# Patient Record
Sex: Female | Born: 1955
Health system: Southern US, Community
[De-identification: ages and names within clinical notes are randomized; demographics above are authoritative.]

## PROBLEM LIST (undated history)

## (undated) DIAGNOSIS — G4730 Sleep apnea, unspecified: Secondary | ICD-10-CM

## (undated) DIAGNOSIS — M1710 Unilateral primary osteoarthritis, unspecified knee: Secondary | ICD-10-CM

## (undated) DIAGNOSIS — E785 Hyperlipidemia, unspecified: Secondary | ICD-10-CM

## (undated) DIAGNOSIS — T7840XA Allergy, unspecified, initial encounter: Secondary | ICD-10-CM

## (undated) DIAGNOSIS — M179 Osteoarthritis of knee, unspecified: Secondary | ICD-10-CM

## (undated) DIAGNOSIS — K589 Irritable bowel syndrome without diarrhea: Secondary | ICD-10-CM

## (undated) DIAGNOSIS — I2699 Other pulmonary embolism without acute cor pulmonale: Secondary | ICD-10-CM

## (undated) DIAGNOSIS — K219 Gastro-esophageal reflux disease without esophagitis: Secondary | ICD-10-CM

## (undated) HISTORY — DX: Sleep apnea, unspecified: G47.30

## (undated) HISTORY — DX: Allergy, unspecified, initial encounter: T78.40XA

## (undated) HISTORY — PX: CHOLECYSTECTOMY: SHX55

## (undated) HISTORY — DX: Hyperlipidemia, unspecified: E78.5

## (undated) HISTORY — PX: JOINT REPLACEMENT: SHX530

## (undated) HISTORY — PX: TUBAL LIGATION: SHX77

---

## 2001-02-06 ENCOUNTER — Encounter: Admission: RE | Admit: 2001-02-06 | Discharge: 2001-05-07 | Payer: Self-pay | Admitting: Podiatry

## 2001-12-31 ENCOUNTER — Other Ambulatory Visit: Admission: RE | Admit: 2001-12-31 | Discharge: 2001-12-31 | Payer: Self-pay | Admitting: Obstetrics and Gynecology

## 2002-02-27 ENCOUNTER — Emergency Department (HOSPITAL_COMMUNITY): Admission: EM | Admit: 2002-02-27 | Discharge: 2002-02-27 | Payer: Self-pay | Admitting: Emergency Medicine

## 2002-07-14 ENCOUNTER — Emergency Department (HOSPITAL_COMMUNITY): Admission: EM | Admit: 2002-07-14 | Discharge: 2002-07-14 | Payer: Self-pay | Admitting: Emergency Medicine

## 2002-07-15 ENCOUNTER — Ambulatory Visit (HOSPITAL_COMMUNITY): Admission: RE | Admit: 2002-07-15 | Discharge: 2002-07-15 | Payer: Self-pay | Admitting: Emergency Medicine

## 2002-07-15 ENCOUNTER — Emergency Department (HOSPITAL_COMMUNITY): Admission: EM | Admit: 2002-07-15 | Discharge: 2002-07-15 | Payer: Self-pay | Admitting: Emergency Medicine

## 2002-07-15 ENCOUNTER — Encounter: Payer: Self-pay | Admitting: Emergency Medicine

## 2002-07-16 ENCOUNTER — Emergency Department (HOSPITAL_COMMUNITY): Admission: EM | Admit: 2002-07-16 | Discharge: 2002-07-16 | Payer: Self-pay | Admitting: Emergency Medicine

## 2003-01-08 ENCOUNTER — Encounter: Payer: Self-pay | Admitting: Emergency Medicine

## 2003-01-08 ENCOUNTER — Emergency Department (HOSPITAL_COMMUNITY): Admission: EM | Admit: 2003-01-08 | Discharge: 2003-01-08 | Payer: Self-pay | Admitting: Emergency Medicine

## 2003-01-10 ENCOUNTER — Encounter: Payer: Self-pay | Admitting: Orthopaedic Surgery

## 2003-01-10 ENCOUNTER — Ambulatory Visit (HOSPITAL_COMMUNITY): Admission: RE | Admit: 2003-01-10 | Discharge: 2003-01-10 | Payer: Self-pay | Admitting: Orthopaedic Surgery

## 2003-05-19 ENCOUNTER — Emergency Department (HOSPITAL_COMMUNITY): Admission: EM | Admit: 2003-05-19 | Discharge: 2003-05-19 | Payer: Self-pay | Admitting: Emergency Medicine

## 2003-05-20 ENCOUNTER — Encounter: Payer: Self-pay | Admitting: Emergency Medicine

## 2003-05-20 ENCOUNTER — Emergency Department (HOSPITAL_COMMUNITY): Admission: EM | Admit: 2003-05-20 | Discharge: 2003-05-20 | Payer: Self-pay | Admitting: Emergency Medicine

## 2003-12-16 ENCOUNTER — Emergency Department (HOSPITAL_COMMUNITY): Admission: EM | Admit: 2003-12-16 | Discharge: 2003-12-16 | Payer: Self-pay | Admitting: Emergency Medicine

## 2003-12-17 ENCOUNTER — Emergency Department (HOSPITAL_COMMUNITY): Admission: EM | Admit: 2003-12-17 | Discharge: 2003-12-17 | Payer: Self-pay | Admitting: Emergency Medicine

## 2004-01-23 ENCOUNTER — Emergency Department (HOSPITAL_COMMUNITY): Admission: EM | Admit: 2004-01-23 | Discharge: 2004-01-24 | Payer: Self-pay

## 2005-04-11 ENCOUNTER — Emergency Department (HOSPITAL_COMMUNITY): Admission: EM | Admit: 2005-04-11 | Discharge: 2005-04-11 | Payer: Self-pay | Admitting: Emergency Medicine

## 2005-05-26 ENCOUNTER — Other Ambulatory Visit: Admission: RE | Admit: 2005-05-26 | Discharge: 2005-05-26 | Payer: Self-pay | Admitting: Orthopedic Surgery

## 2005-05-29 ENCOUNTER — Emergency Department (HOSPITAL_COMMUNITY): Admission: EM | Admit: 2005-05-29 | Discharge: 2005-05-29 | Payer: Self-pay | Admitting: Emergency Medicine

## 2006-03-17 ENCOUNTER — Observation Stay (HOSPITAL_COMMUNITY): Admission: EM | Admit: 2006-03-17 | Discharge: 2006-03-18 | Payer: Self-pay | Admitting: *Deleted

## 2006-03-20 ENCOUNTER — Inpatient Hospital Stay (HOSPITAL_COMMUNITY): Admission: AD | Admit: 2006-03-20 | Discharge: 2006-03-24 | Payer: Self-pay | Admitting: Internal Medicine

## 2006-05-09 ENCOUNTER — Other Ambulatory Visit: Admission: RE | Admit: 2006-05-09 | Discharge: 2006-05-09 | Payer: Self-pay | Admitting: Obstetrics and Gynecology

## 2006-07-12 ENCOUNTER — Emergency Department (HOSPITAL_COMMUNITY): Admission: EM | Admit: 2006-07-12 | Discharge: 2006-07-12 | Payer: Self-pay | Admitting: Emergency Medicine

## 2007-04-07 ENCOUNTER — Emergency Department (HOSPITAL_COMMUNITY): Admission: EM | Admit: 2007-04-07 | Discharge: 2007-04-07 | Payer: Self-pay | Admitting: Emergency Medicine

## 2007-06-16 ENCOUNTER — Ambulatory Visit (HOSPITAL_COMMUNITY): Admission: RE | Admit: 2007-06-16 | Discharge: 2007-06-16 | Payer: Self-pay | Admitting: Family Medicine

## 2008-01-01 ENCOUNTER — Ambulatory Visit (HOSPITAL_COMMUNITY): Admission: RE | Admit: 2008-01-01 | Discharge: 2008-01-01 | Payer: Self-pay | Admitting: Family Medicine

## 2008-06-04 ENCOUNTER — Ambulatory Visit (HOSPITAL_COMMUNITY): Admission: RE | Admit: 2008-06-04 | Discharge: 2008-06-04 | Payer: Self-pay | Admitting: Family Medicine

## 2011-02-25 NOTE — Discharge Summary (Signed)
Mallory [name], Mallory [name] NO.:  [id number]   MEDICAL RECORD NO.:  [id number]          PATIENT TYPE:  OBV   LOCATION:  4728                         FACILITY:  [facility]   PHYSICIAN:  Elliot Cousin, M.D.    DATE OF BIRTH:  1956/09/29   DATE OF ADMISSION:  03/16/2006  DATE OF DISCHARGE:  03/18/2006                                 DISCHARGE SUMMARY   DISCHARGE DIAGNOSES:  1.  Chest pain, myocardial infarction ruled out.  2.  Gastroesophageal reflux disease.  3.  Hyperlipidemia.  4.  Asymptomatic bradycardia.  5.  History of Crohn's disease.   DISCHARGE MEDICATIONS:  1.  Nexium 40 mg daily.  2.  Lipitor 10 mg nightly.   DISCHARGE DISPOSITION:  The patient was discharged to home in improved and  stable condition on March 18, 2006.  She was advised to follow up with the  nurse family practitioner and/or Dr. Hal Hope at [facility]  or the [facility] in [place].   PROCEDURE PERFORMED:  Bilateral lower extremity venous Doppler study.  The  results were negative for DVT.   HISTORY OF PRESENT ILLNESS:  The patient is a 55 year old lady with no  significant past medical history with the exception of a remote history of  Crohn's disease and gastroesophageal reflux disease, who presented to the  emergency department on March 16, 2006, with a chief complaint of intermittent  chest pain.  The patient's family history is positive for coronary artery  disease.  She was therefore admitted for further evaluation and management.   HOSPITAL COURSE:  CHEST PAIN/GASTROESOPHAGEAL REFLUX  DISEASE/HYPERLIPIDEMIA/ASYMPTOMATIC BRADYCARDIA.  On admission, the patient  was hemodynamically stable.  Her EKG revealed a normal sinus rhythm with a  heart rate of 73 beats per minute with no acute abnormalities.  Cardiac  markers in the emergency department were negative.  Symptomatic treatment  was started with as needed sublingual nitroglycerin, as needed Tylenol,  and  as needed Dilaudid.  The patient was started on prophylactic Lovenox 40 mg  subcut. q.24 h.  Protonix at 40 mg b.i.d. was prescribed as well.  Cardiac  enzymes were ordered as well as a BNP, D-dimer, TSH, and lipid profile.  The  cardiac enzymes x3 were completely negative; therefore, the patient ruled  out for a myocardial infarction.  The D-dimer was elevated at 0.37.  The BNP  was less than 30.  The TSH was within normal limits at 2.395.  The fasting  lipid profile revealed a total cholesterol of 204, triglycerides of 91, HDL  cholesterol of 37, and LDL of 149.  The patient was started on Lipitor 10 mg  nightly.  Because of the elevated D-dimer, bilateral lower extremity venous  Dopplers were ordered to rule out DVT.  The results were negative.   The following morning, a followup EKG revealed sinus bradycardia with a  heart rate of 54 beats per minute.  The patient was completely asymptomatic.   She gives a positive history for heartburn particularly after eating.  She  has been self medicating with over-the-counter Prilosec, but  apparently it  has not been helping.  The most likely etiology of the patient's chest pain  is gastroesophageal reflux disease.  She was prescribed and sent home on  Nexium 40 mg daily.  Further followup and evaluation will be deferred to her  primary care physician.  The patient was also advised on lifestyle changes  including weight loss, avoiding greasy and fried foods, and sitting up for  at least 2 hours after eating.      Elliot Cousin, M.D.  Electronically Signed     DF/MEDQ  D:  03/18/2006  T:  03/20/2006  Job:  [id number]   cc:   Clydie Braun L. Hal Hope, M.D.  Fax: [phone]

## 2011-02-25 NOTE — H&P (Signed)
Mallory [name], Mallory [name]                ACCOUNT NO.:  [id number]   MEDICAL RECORD NO.:  [id number]          PATIENT TYPE:  INP   LOCATION:  A209                          FACILITY:  [facility]   PHYSICIAN:  [name]. [name], MD  DATE OF BIRTH:  06-Mar-1956   DATE OF ADMISSION:  03/20/2006  DATE OF DISCHARGE:  LH                                HISTORY & PHYSICAL   CHIEF COMPLAINT:  Chest pain.   HISTORY OF PRESENT ILLNESS:  Patient presented after being discharged from  [facility] for chest pain and shortness of breath.  Her symptoms  persisted and got worse.  She had notable dyspnea on exertion which had been  severe.  Her chest pain is described as substernal and radiating to her  right arm.  This has been going on for multiple days and getting worse.  She  told me that her work-up was negative in-house and in fact, on March 16, 2006  history and physical she was noted to have previous reflux which is true.  She had initial diagnosis of Crohn's disease earlier age.  There reportedly  her enzymes were negative in-house and a discharge summary dictated by Dr.  Elliot Cousin on March 18, 2006 stated that she had asymptomatic bradycardia,  hyperlipidemia, and GERD with chest pain not further worked up.  On  presentation to the office she was concerned over ongoing chest pain, no  relief with antacids.  She had no hemoptysis, fever, cough, or chills.  No  hematemesis, hematochezia, or melena.   PAST MEDICAL HISTORY:  As outlined in her HPI.   SOCIAL HISTORY:  Nonsmoker.  No alcohol or other drug use.   FAMILY HISTORY:  Positive for young coronary deaths in female members,  otherwise noncontributory.   REVIEW OF SYSTEMS:  As under HPI, also negative.   PHYSICAL EXAMINATION:  SKIN:  Unremarkable.  HEENT:  No JVD or adenopathy.  NECK:  Supple.  CHEST:  Clear and nontender.  CARDIAC:  Regular rate and rhythm without murmurs, rubs, or gallops.  ABDOMEN:  Soft.  No organomegaly or masses.  EXTREMITIES:  Without clubbing, cyanosis, edema.  NEUROLOGIC:  Nonfocal.   LABORATORIES:  On admission revealed a positive elevated D-dimer.  This  resulted eventually due to technical difficulties and some delay with a CT  of the chest to rule out a pulmonary embolus which was positive.  Anticoagulation was started.  She will be admitted for continued  anticoagulation oral anticoagulant therapy  introduction.  High-dose antacids and Carafate suspension as well as H.  pylori serology will be obtained.  Eventually, she is going to need an EGD.  However, we will treat her pulmonary embolus first.  Will also have  cardiology see her for outpatient post PE stabilization evaluation of  coronary status.      [name]. [name], MD  Electronically Signed     LJF/MEDQ  D:  03/21/2006  T:  03/21/2006  Job:  [id number]

## 2011-02-25 NOTE — H&P (Signed)
Mallory [name], Mallory [name]                ACCOUNT NO.:  [id number]   MEDICAL RECORD NO.:  [id number]          PATIENT TYPE:  INP   LOCATION:  1827                         FACILITY:  [facility]   PHYSICIAN:  Lonia Blood, M.D.      DATE OF BIRTH:  Sep 07, 1956   DATE OF ADMISSION:  03/16/2006  DATE OF DISCHARGE:                                HISTORY & PHYSICAL   PRIMARY CARE PHYSICIAN:  [facility].   PRESENTING COMPLAINT:  Retrosternal  chest pain.   HISTORY OF THE PRESENT ILLNESS:  The patient is a 55 year old female with no  significant past medical history, except for a remote history of Crohn's  disease  as well as GERD.  The patient is not taking anything for her reflux  disease.  For the past three days she has had chest pain on and off mainly  in the retrosternal region. She initially thought it was her acid reflux;  however, this was severe and worse than what she normally experiences with  her acid reflux.  She went to work, but on her way there she could not  tolerate the pain.  She subsequently was brought to the emergency room.  Currently she is feeling better after a combination of treatment with some  sublingual nitro and morphine.   CARDIAC RISK FACTORS:  The patient's risks factors for cardia disease are  low, except for her father having cardiac disease in his 64s and died from  heart disease.   PAST MEDICAL HISTORY:  The past medical history is significant for mainly  GERD and Crohn's disease when she was 18.  Currently not on any medications.   ALLERGIES:  The patient has no known drug allergies.   MEDICATIONS:  The medications are none.   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  She denies any tobacco or alcohol intake.  She has two  children.  She is divorced currently.  She lives in [place].   FAMILY HISTORY:  Father had coronary artery disease an died from heart  disease.  Mother is relatively healthy.   REVIEW OF SYSTEMS:  A 12-point review of systems performed  is mainly per  the HPI.   PHYSICAL EXAMINATION:  VITAL SIGNS:  On exam temperature is 97.7, blood  pressure 134/88,  pulse 76, respiratory rate 16.  GENERAL APPEARANCE:  The patient is alert, awake and oriented, and in no  acute distress.  HEENT:  PERRL.  EOMI.  NECK:  The neck is supple.  No JVD.  No lymphadenopathy.  CHEST AND LUNGS:  Respiratory shows good air entry  bilaterally.  No wheezes  or rales.  HEART:  Cardiovascular system shows she has a regular rate and rhythm.  ABDOMEN:  The abdomen is soft and nontender with positive bowel sounds.  EXTREMITIES:  The extremities show no edema, cyanosis or clubbing.   LABORATORY DATA:  The patient's labs show sodium 138, potassium 3.8,  chloride 104, BUN 9, and glucose 98.  Creatinine is also normal.  Initial  cardiac enzymes are negative.  EKG  shows normal sinus rhythm and no ST-T  wave changes.  Chest x-ray is also negative   ASSESSMENT:  This a 55 year old female who has moderate risk actors for  cardiac disease who presents with retrosternal chest pain.  Her pain is  slightly reproducible with pressure.  I suspect this is more likely due to  be gastrointestinal than cardiac.  She has, however, described these  symptoms as unusual and out of the ordinary from what she is used.   For the above reasons I will proceed as follows:  1.  Chest pain.  I will admit the patient for 23 hours observation.  I will      check serial cardiac enzymes fasting lipid panel  TSH and BNP.  I will      also review her full electrolyte panel and CBC.   1.  I will treat her for reflux with proton pump inhibitor q.12 hours at      this point.   1.  If her enzymes are negative within the next 24 hours and her lipid panel      is okay I suggest that the patient will not require any stress testing      or invasive procedures.  If, however, any of these are quite abnormal      then we may have  to  arrange for her to have risk stratification,      probably a stress test either as an inpatient or as a outpatient.      Lonia Blood, M.D.  Electronically Signed     LG/MEDQ  D:  03/17/2006  T:  03/17/2006  Job:  [id number]

## 2011-02-25 NOTE — Discharge Summary (Signed)
Mallory [name], Mallory [name]                ACCOUNT NO.:  [id number]   MEDICAL RECORD NO.:  [id number]          PATIENT TYPE:  INP   LOCATION:  A209                          FACILITY:  [facility]   PHYSICIAN:  [name]. [name], MD  DATE OF BIRTH:  [birth year]   DATE OF ADMISSION:  03/20/2006  DATE OF DISCHARGE:  06/15/2007LH                                 DISCHARGE SUMMARY   DISCHARGE DIAGNOSES:  1.  Pulmonary embolus, acute.  2.  Hyperlipidemia.  3.  Gastroesophageal reflux disease.  4.  Regional enteritis.   DISCHARGE MEDICATIONS:  1.  Coumadin 10 mg p.o. daily.  2.  Zocor 40 mg p.o. daily.   SUMMARY:  The patient was admitted with chest pain after being discharged  from [facility] for an identical evaluation.  She, in fact, was  ruled out for a myocardial infarction down there.  However, pulmonary  embolus was not entertained in the diagnostic differential diagnosis.  Upon  seeing her in the office, I was concerned over shortness of breath and  pleuritic chest pain.  We ended up getting a CT scan which confirmed the  clinical impression of pulmonary embolus.  She was admitted for institution  of anticoagulation.  She did well without complication and was discharged  for therapeutic indices to be followed as an outpatient office visit.      [name]. [name], MD  Electronically Signed     LJF/MEDQ  D:  04/13/2006  T:  04/13/2006  Job:  [id number]

## 2011-04-28 ENCOUNTER — Emergency Department (HOSPITAL_COMMUNITY): Payer: Self-pay

## 2011-04-28 ENCOUNTER — Other Ambulatory Visit: Payer: Self-pay

## 2011-04-28 ENCOUNTER — Encounter: Payer: Self-pay | Admitting: Emergency Medicine

## 2011-04-28 ENCOUNTER — Emergency Department (HOSPITAL_COMMUNITY)
Admission: EM | Admit: 2011-04-28 | Discharge: 2011-04-28 | Disposition: A | Discharge status: Home / Self Care | Payer: Self-pay | Attending: Emergency Medicine | Admitting: Emergency Medicine

## 2011-04-28 DIAGNOSIS — R071 Chest pain on breathing: Secondary | ICD-10-CM | POA: Insufficient documentation

## 2011-04-28 DIAGNOSIS — Z86718 Personal history of other venous thrombosis and embolism: Secondary | ICD-10-CM | POA: Insufficient documentation

## 2011-04-28 DIAGNOSIS — R0789 Other chest pain: Secondary | ICD-10-CM

## 2011-04-28 HISTORY — DX: Other pulmonary embolism without acute cor pulmonale: I26.99

## 2011-04-28 LAB — HEPATIC FUNCTION PANEL
ALT: 27 U/L (ref 0–35)
AST: 23 U/L (ref 0–37)
Bilirubin, Direct: 0.1 mg/dL (ref 0.0–0.3)
Total Bilirubin: 0.4 mg/dL (ref 0.3–1.2)
Total Protein: 7.4 g/dL (ref 6.0–8.3)

## 2011-04-28 LAB — BASIC METABOLIC PANEL
CO2: 29 mEq/L (ref 19–32)
Calcium: 9.6 mg/dL (ref 8.4–10.5)
GFR calc Af Amer: >60 mL/min (ref >60)
GFR calc non Af Amer: >60 mL/min (ref >60)
Sodium: 139 mEq/L (ref 135–145)

## 2011-04-28 LAB — CBC
Hemoglobin: 16 g/dL — ABNORMAL HIGH (ref 12.0–15.0)
MCHC: 33.3 g/dL (ref 30.0–36.0)
RDW: 13.6 % (ref 11.5–15.5)
WBC: 7 10*3/uL (ref 4.0–10.5)

## 2011-04-28 LAB — CARDIAC PANEL(CRET KIN+CKTOT+MB+TROPI): Total CK: 67 U/L (ref 7–177)

## 2011-04-28 MED ORDER — OXYCODONE-ACETAMINOPHEN 5-325 MG PO TABS
1.0000 | ORAL_TABLET | Freq: Once | ORAL | Status: AC
  Administered 2011-04-28: 1 via ORAL
  Filled 2011-04-28: qty 1

## 2011-04-28 MED ORDER — IOHEXOL 350 MG/ML SOLN
100.0000 mL | Freq: Once | INTRAVENOUS | Status: AC | PRN
  Administered 2011-04-28: 100 mL via INTRAVENOUS

## 2011-04-28 MED ORDER — HYDROCODONE-ACETAMINOPHEN 5-325 MG PO TABS: 20.0000 | ORAL_TABLET | Freq: Four times a day (QID) | ORAL | Status: AC | PRN

## 2011-04-28 NOTE — ED Notes (Signed)
Pt to CT

## 2011-04-28 NOTE — ED Notes (Signed)
Pt reports mid-sternal cp that began this am around 2:00.  Pt denies any sob, diaphoresis, n/v.  Pt has a hx of pulmonary embolism.

## 2011-04-28 NOTE — ED Provider Notes (Addendum)
History     Chief Complaint  Patient presents with  . Chest Pain   HPI Comments: Sob and diaphoresis mild and intermittent. Report h/o PE with similar pain. Tried antacids this AM with no relief.  Patient is a 55 y.o. female presenting with chest pain. The history is provided by the patient.  Chest Pain The chest pain began yesterday. Chest pain occurs constantly. The chest pain is unchanged. Quality: on right side last night but on left side this AM; reports similar pain in the past dx with PE. The pain does not radiate. Primary symptoms include shortness of breath. Pertinent negatives for primary symptoms include no fever, no fatigue, no syncope, no cough, no abdominal pain, no nausea and no vomiting.  Associated symptoms include diaphoresis.  Pertinent negatives for associated symptoms include no lower extremity edema.  Pertinent negatives for past medical history include no seizures.     Past Medical History  Diagnosis Date  . Pulmonary embolism     Past Surgical History  Procedure Date  . Cholecystectomy   . Cesarean section     Family History  Problem Relation Age of Onset  . Diabetes Mother   . Heart failure Father   . Diabetes Brother     History  Substance Use Topics  . Smoking status: Never Smoker   . Smokeless tobacco: Not on file  . Alcohol Use: No    OB History    Grav Para Term Preterm Abortions TAB SAB Ect Mult Living                  Review of Systems  Constitutional: Positive for diaphoresis. Negative for fever and fatigue.  HENT: Negative for congestion, sinus pressure and ear discharge.   Eyes: Negative for discharge.  Respiratory: Positive for shortness of breath. Negative for cough.   Cardiovascular: Positive for chest pain. Negative for syncope.  Gastrointestinal: Negative for nausea, vomiting, abdominal pain and diarrhea.  Genitourinary: Negative for frequency and hematuria.  Musculoskeletal: Negative for back pain.  Skin: Negative for  rash.  Neurological: Negative for seizures and headaches.  Hematological: Negative.   Psychiatric/Behavioral: Negative for hallucinations.    Physical Exam  LMP 04/23/2011  Physical Exam  Constitutional: She is oriented to person, place, and time. She appears well-developed.  HENT:  Head: Normocephalic and atraumatic.  Eyes: Conjunctivae and EOM are normal. No scleral icterus.  Neck: Neck supple. No thyromegaly present.  Cardiovascular: Normal rate and regular rhythm.  Exam reveals no gallop and no friction rub.   No murmur heard. Pulmonary/Chest: No stridor. She has no wheezes. She has no rales. She exhibits tenderness.  Abdominal: She exhibits no distension. There is no tenderness. There is no rebound.  Musculoskeletal: Normal range of motion. She exhibits no edema.       Mild redness to dorsum right foot with minimal soreness (appears to be from shoe)  Lymphadenopathy:    She has no cervical adenopathy.  Neurological: She is oriented to person, place, and time. Coordination normal.  Skin: No rash noted. No erythema.  Psychiatric: She has a normal mood and affect. Her behavior is normal.    ED Course  Procedures  Results for orders placed during the hospital encounter of 04/28/11  BASIC METABOLIC PANEL      Component Value Range   Sodium 139  135 - 145 (mEq/L)   Potassium 3.8  3.5 - 5.1 (mEq/L)   Chloride 101  96 - 112 (mEq/L)   CO2 29  19 - 32 (mEq/L)   Glucose, Bld 102 (*) 70 - 99 (mg/dL)   BUN 8  6 - 23 (mg/dL)   Creatinine, Ser 7.82  0.50 - 1.10 (mg/dL)   Calcium 9.6  8.4 - 95.6 (mg/dL)   GFR calc non Af Amer >60  >60 (mL/min)   GFR calc Af Amer >60  >60 (mL/min)  CBC      Component Value Range   WBC 7.0  4.0 - 10.5 (K/uL)   RBC 5.75 (*) 3.87 - 5.11 (MIL/uL)   Hemoglobin 16.0 (*) 12.0 - 15.0 (g/dL)   HCT 21.3 (*) 08.6 - 46.0 (%)   MCV 83.5  78.0 - 100.0 (fL)   MCH 27.8  26.0 - 34.0 (pg)   MCHC 33.3  30.0 - 36.0 (g/dL)   RDW 57.8  46.9 - 62.9 (%)   Platelets  269  150 - 400 (K/uL)  HEPATIC FUNCTION PANEL      Component Value Range   Total Protein 7.4  6.0 - 8.3 (g/dL)   Albumin 3.6  3.5 - 5.2 (g/dL)   AST 23  0 - 37 (U/L)   ALT 27  0 - 35 (U/L)   Alkaline Phosphatase 106  39 - 117 (U/L)   Total Bilirubin 0.4  0.3 - 1.2 (mg/dL)   Bilirubin, Direct 0.1  0.0 - 0.3 (mg/dL)   Indirect Bilirubin 0.3  0.3 - 0.9 (mg/dL)  D-DIMER, QUANTITATIVE      Component Value Range   D-Dimer, Quant 0.49 (*) 0.00 - 0.48 (ug/mL-FEU)  CARDIAC PANEL(CRET KIN+CKTOT+MB+TROPI)      Component Value Range   Total CK PENDING  7 - 177 (U/L)   CK, MB 1.9  0.3 - 4.0 (ng/mL)   Troponin I <0.30  <0.30 (ng/mL)   Relative Index PENDING  0.0 - 2.5    Ct Angio Chest W/cm &/or Wo Cm  04/28/2011  *RADIOLOGY REPORT*  Clinical Data:  Chest pain, shortness of breath, history pulmonary embolism  CT ANGIOGRAPHY CHEST WITH CONTRAST  Technique:  Multidetector CT imaging of the chest was performed using the standard protocol during bolus administration of intravenous contrast.  Multiplanar CT image reconstructions including MIPs were obtained to evaluate the vascular anatomy.  Contrast:  100 ml Omnipaque 350 IV.  Comparison:  06/04/2008  Findings: Aorta normal caliber without aneurysm or dissection. Pulmonary arteries patent. No evidence of pulmonary embolism. Scattered normal-sized axillary, mediastinal and hilar nodes. Visualized portion of liver and spleen normal appearance. Bibasilar atelectasis. No pulmonary infiltrate, pleural effusion or pneumothorax. No acute osseous findings.  Review of the MIP images confirms the above findings.  IMPRESSION: Bibasilar atelectasis. No evidence pulmonary embolism or additional acute intrathoracic process.  Original Report Authenticated By: Lollie Marrow, M.D.   Chest Portable 1 View  04/28/2011  *RADIOLOGY REPORT*  Clinical Data:  Chest pain, history pulmonary embolism  PORTABLE CHEST - 1 VIEW  Comparison: Portable exam 0851 hours compared to 01/01/2008   Findings: Borderline enlargement of cardiac silhouette. Mediastinal contours and pulmonary vascularity normal. Bibasilar atelectasis. Lungs otherwise clear. No pneumothorax or pleural effusion. Osseous structures unremarkable.  IMPRESSION: Borderline enlargement of cardiac silhouette. Bibasilar atelectasis.  Original Report Authenticated By: Lollie Marrow, M.D.    Date: 04/28/2011  Rate: 59  Rhythm: sinus bradycardia  QRS Axis: normal  Intervals: normal  ST/T Wave abnormalities: nonspecific ST changes  Conduction Disutrbances:none  Narrative Interpretation:   Old EKG Reviewed: unchanged    11:53 AM Pt recheck: pt still c/o discomfort/soreness  in anterior chest wall, worse with movement, radiates back between shoulders; mild tenderness on palpation; discussed negative CT chest findings and blood work results with pt; recommend close f/u with PCP Dr. [name], pt understands and agrees.  MDM Pt h/o PE with similar chest pain; d-dimer positive (0.49); chest CT negative for PE; cardiac markers normal; mild tenderness to anterior chest wall on reexam; will d/c with pain meds and close f/u with Dr. [name].   Written by Enos Fling acting as scribe for Benny Lennert, MD. I personally performed the services described in this documentation, which was scribed in my presence. The recorded information has been reviewed and considered.   Benny Lennert, MD 04/28/11 1200  Benny Lennert, MD 04/28/11 1201

## 2011-04-28 NOTE — ED Notes (Signed)
Pt c/o left sided chest pain since 2 am. Pt states she has hx of blood clot and this pain feels the same way.

## 2011-06-23 ENCOUNTER — Other Ambulatory Visit (HOSPITAL_COMMUNITY): Payer: Self-pay | Admitting: Family Medicine

## 2011-06-23 DIAGNOSIS — R109 Unspecified abdominal pain: Secondary | ICD-10-CM

## 2011-06-28 ENCOUNTER — Ambulatory Visit (HOSPITAL_COMMUNITY)
Admission: RE | Admit: 2011-06-28 | Discharge: 2011-06-28 | Disposition: A | Discharge status: Home / Self Care | Payer: Self-pay | Source: Ambulatory Visit | Attending: Family Medicine | Admitting: Family Medicine

## 2011-06-28 DIAGNOSIS — R109 Unspecified abdominal pain: Secondary | ICD-10-CM | POA: Insufficient documentation

## 2011-07-08 ENCOUNTER — Encounter (INDEPENDENT_AMBULATORY_CARE_PROVIDER_SITE_OTHER): Payer: Self-pay | Admitting: *Deleted

## 2011-07-21 ENCOUNTER — Encounter (INDEPENDENT_AMBULATORY_CARE_PROVIDER_SITE_OTHER): Payer: Self-pay | Admitting: Internal Medicine

## 2011-07-21 ENCOUNTER — Ambulatory Visit (INDEPENDENT_AMBULATORY_CARE_PROVIDER_SITE_OTHER): Payer: Self-pay | Admitting: Internal Medicine

## 2011-07-21 VITALS — BP 110/70 | HR 72 | Temp 98.8°F | Ht 63.0 in | Wt 182.5 lb

## 2011-07-21 DIAGNOSIS — R1084 Generalized abdominal pain: Secondary | ICD-10-CM

## 2011-07-21 DIAGNOSIS — R7401 Elevation of levels of liver transaminase levels: Secondary | ICD-10-CM

## 2011-07-21 LAB — COMPREHENSIVE METABOLIC PANEL
Albumin: 4.4 g/dL (ref 3.5–5.2)
CO2: 26 mEq/L (ref 19–32)
Calcium: 9.5 mg/dL (ref 8.4–10.5)
Glucose, Bld: 98 mg/dL (ref 70–99)
Potassium: 3.7 mEq/L (ref 3.5–5.3)
Sodium: 142 mEq/L (ref 135–145)
Total Protein: 6.8 g/dL (ref 6.0–8.3)

## 2011-07-21 NOTE — Patient Instructions (Signed)
Labs today. Will discuss with Dr Rehman.  

## 2011-07-21 NOTE — Progress Notes (Signed)
Subjective:     Patient ID: Mallory [name], female   DOB: [birth year], 55 y.o.   MRN: [id number]  HPI  Mallory [name] is  55 yr old female referred to our by [facility] for elevated tranaminases and she was having rt upper quadrant pain 3 weeks ago. Noted her transaminases were elevated. (see below). She underwent an [place] which was essentially normal. Rt upper quadrant pain lasted 3 days and then resolved.  She did have a fever and chills with her abdominal pain.  She rated the pain at a 7/10.  No further pain.  Appetite is good. No weight loss. No abdominal pain. BM x 1 every 2 days. No melena or rectal bleeding. She does tell me she thinks she has a hemorrhoid. She tells me she has frequent acid reflux.  She usually has acid reflux on a daily basis.  She takes OTC omeprazole which helps.  She avoid spicy and greasy foods.  No tatoos. No IV drugs. 04/28/2011 AST 23, ALT 27, ALP 106  (Normal).  06/22/2011 Total bili 5.3, ALP 149, AST 58, ALT 139, Amylase 56, Lipase 30 06/28/11  US abdomen  Other: No free fluid  IMPRESSION:  Probable fatty infiltration of liver as above.  Post cholecystectomy.  Inadequate/incomplete visualization of IVC and pancreas.  Age-related renal cortical atrophy.  No definite acute abnormalities.  He last colonoscopy was about 25 yrs ago and she was told she may have Crohn's. She is not having an GI problems.  Review of Systems see hpi No current outpatient prescriptions on file.   Past Surgical History  Procedure Date  . Cesarean section   . Cholecystectomy 15 yrs ago   Past Medical History  Diagnosis Date  . Pulmonary embolism     5 yrs ago (unknown region)   No Known Allergies Family Status  Relation Status Death Age  . Mother Alive   . Father Deceased     CHF  . Brother Alive   . Sister Alive     good health  . Child Alive     one has tumors of her adrenal glands.  . Child Deceased     Pancrease   History   Social History  . Marital Status: Divorced    Spouse Name: N/A    Number of Children: N/A  . Years of Education: N/A   Occupational History  . Not on file.   Social History Main Topics  . Smoking status: Never Smoker   . Smokeless tobacco: Not on file  . Alcohol Use: No  . Drug Use: No  . Sexually Active:    Other Topics Concern  . Not on file   Social History Narrative  . No narrative on file  .    Objective:   Physical Exam Filed Vitals:   07/21/11 1105  BP: 110/70  Pulse: 72  Temp: 98.8 F (37.1 C)  Height: 5\' 3"  (1.6 m)  Weight: 182 lb 8 oz (82.781 kg)    Alert and oriented. Skin warm and dry. Oral mucosa is moist. Natural teeth in good condition. Sclera anicteric, conjunctivae is pink. Thyroid not enlarged. No cervical lymphadenopathy. Lungs clear. Heart regular rate and rhythm.  Abdomen is soft. Bowel sounds are positive. No hepatomegaly. No abdominal masses felt. No tenderness.  No edema to lower extremities. Patient is alert and oriented.      Assessment:    Elevated transaminases, rt upper quadrant pain which has resolved.  Suspect she may have passed a  biliary stone.    Plan:    Will repeat C-met today and will repeat in 1 month.  Will discuss this case with Dr. Karilyn Cota.  Patient is satisfied with her care.

## 2011-07-27 LAB — URINALYSIS, ROUTINE W REFLEX MICROSCOPIC
Ketones, ur: NEGATIVE
Leukocytes, UA: NEGATIVE
Nitrite: NEGATIVE

## 2011-07-27 LAB — DIFFERENTIAL
Basophils Absolute: 0
Lymphocytes Relative: 13
Monocytes Absolute: 0.8 — ABNORMAL HIGH
Neutro Abs: 10.9 — ABNORMAL HIGH
Neutrophils Relative %: 80 — ABNORMAL HIGH

## 2011-07-27 LAB — CBC
HCT: 46.7 — ABNORMAL HIGH
MCV: 83.5
Platelets: 283
RDW: 14.6 — ABNORMAL HIGH

## 2011-07-27 LAB — COMPREHENSIVE METABOLIC PANEL
Albumin: 3.6
BUN: 6
Chloride: 105
Creatinine, Ser: 0.8
Glucose, Bld: 110 — ABNORMAL HIGH
Total Bilirubin: 0.9
Total Protein: 6.7

## 2011-07-27 LAB — URINE CULTURE: Colony Count: NO GROWTH

## 2011-07-27 LAB — PREGNANCY, URINE: Preg Test, Ur: NEGATIVE

## 2011-07-27 LAB — LIPASE, BLOOD: Lipase: 21

## 2011-08-03 ENCOUNTER — Telehealth (INDEPENDENT_AMBULATORY_CARE_PROVIDER_SITE_OTHER): Payer: Self-pay | Admitting: *Deleted

## 2011-08-03 DIAGNOSIS — R748 Abnormal levels of other serum enzymes: Secondary | ICD-10-CM

## 2011-08-03 NOTE — Telephone Encounter (Signed)
Patient states was told to call you when he got sick again

## 2011-08-03 NOTE — Telephone Encounter (Signed)
I spoke with patient. She will come by to get lab slip to be sure her liver enzymes are not elevated.

## 2011-08-04 ENCOUNTER — Telehealth (INDEPENDENT_AMBULATORY_CARE_PROVIDER_SITE_OTHER): Payer: Self-pay | Admitting: Internal Medicine

## 2011-08-04 LAB — COMPREHENSIVE METABOLIC PANEL
Alkaline Phosphatase: 127 U/L — ABNORMAL HIGH (ref 39–117)
BUN: 15 mg/dL (ref 6–23)
CO2: 23 mEq/L (ref 19–32)
Creat: 1.21 mg/dL — ABNORMAL HIGH (ref 0.50–1.10)
Glucose, Bld: 150 mg/dL — ABNORMAL HIGH (ref 70–99)
Sodium: 138 mEq/L (ref 135–145)
Total Bilirubin: 4.2 mg/dL — ABNORMAL HIGH (ref 0.3–1.2)

## 2011-08-04 NOTE — Telephone Encounter (Signed)
No one answered phone.  She will need an ERCP next week. If she begans to run a fever or chills, go to the ED.

## 2011-08-05 ENCOUNTER — Other Ambulatory Visit (INDEPENDENT_AMBULATORY_CARE_PROVIDER_SITE_OTHER): Payer: Self-pay | Admitting: *Deleted

## 2011-08-05 DIAGNOSIS — R7989 Other specified abnormal findings of blood chemistry: Secondary | ICD-10-CM

## 2011-08-05 DIAGNOSIS — R1011 Right upper quadrant pain: Secondary | ICD-10-CM

## 2011-08-05 MED ORDER — CIPROFLOXACIN 400 MG/40ML IV SOLN: 400.0000 mg | INTRAVENOUS | Status: DC

## 2011-08-05 NOTE — Telephone Encounter (Signed)
ERCP sch'd 08/12/11 @ 8:55, pre-op 08/09/11 @ 1:30, patient aware

## 2011-08-09 ENCOUNTER — Encounter (HOSPITAL_COMMUNITY): Payer: Self-pay

## 2011-08-09 ENCOUNTER — Encounter (HOSPITAL_COMMUNITY)
Admission: RE | Admit: 2011-08-09 | Discharge: 2011-08-09 | Disposition: A | Discharge status: Home / Self Care | Payer: Self-pay | Source: Ambulatory Visit | Attending: Internal Medicine | Admitting: Internal Medicine

## 2011-08-09 DIAGNOSIS — R1011 Right upper quadrant pain: Secondary | ICD-10-CM

## 2011-08-09 DIAGNOSIS — R7989 Other specified abnormal findings of blood chemistry: Secondary | ICD-10-CM

## 2011-08-09 HISTORY — DX: Gastro-esophageal reflux disease without esophagitis: K21.9

## 2011-08-09 LAB — CBC
HCT: 46.7 % — ABNORMAL HIGH (ref 36.0–46.0)
Hemoglobin: 15.5 g/dL — ABNORMAL HIGH (ref 12.0–15.0)
MCV: 84 fL (ref 78.0–100.0)
RBC: 5.56 MIL/uL — ABNORMAL HIGH (ref 3.87–5.11)
WBC: 7.3 10*3/uL (ref 4.0–10.5)

## 2011-08-09 LAB — HEPATIC FUNCTION PANEL
AST: 22 U/L (ref 0–37)
Albumin: 3.6 g/dL (ref 3.5–5.2)
Total Bilirubin: 0.6 mg/dL (ref 0.3–1.2)
Total Protein: 6.7 g/dL (ref 6.0–8.3)

## 2011-08-09 LAB — BASIC METABOLIC PANEL
BUN: 7 mg/dL (ref 6–23)
CO2: 30 mEq/L (ref 19–32)
Chloride: 101 mEq/L (ref 96–112)
Glucose, Bld: 109 mg/dL — ABNORMAL HIGH (ref 70–99)
Potassium: 3.7 mEq/L (ref 3.5–5.1)
Sodium: 139 mEq/L (ref 135–145)

## 2011-08-09 LAB — HCG, SERUM, QUALITATIVE: Preg, Serum: NEGATIVE

## 2011-08-09 NOTE — Patient Instructions (Addendum)
20 Mallory [name]  08/09/2011   Your procedure is scheduled on:   08/12/2011  Report to [facility] at  730  AM.  Call this number if you have problems the morning of surgery: [phone]   Remember:   Do not eat food:After Midnight.  Do not drink clear liquids: After Midnight.  Take these medicines the morning of surgery with A SIP OF WATER: prilosec   Do not wear jewelry, make-up or nail polish.  Do not wear lotions, powders, or perfumes. You may wear deodorant.  Do not shave 48 hours prior to surgery.  Do not bring valuables to the hospital.  Contacts, dentures or bridgework may not be worn into surgery.  Leave suitcase in the car. After surgery it may be brought to your room.  For patients admitted to the hospital, checkout time is 11:00 AM the day of discharge.   Patients discharged the day of surgery will not be allowed to drive home.  Name and phone number of your driver: family  Special Instructions: N/A   Please read over the following fact sheets that you were given: Pain Booklet, Surgical Site Infection Prevention, Anesthesia Post-op Instructions and Care and Recovery After Surgery Endoscopic Retrograde Cholangiopancreatography This is a test used to evaluate people with jaundice (a condition in which the person's skin is turning yellow because of the high level of bilirubin in your blood). Bilirubin is a product of the blood which is elevated when an obstruction in the bile duct occurs. The bile ducts are the pipe-like system which carry the bile from the liver to the gallbladder and out into your small bowel. In this test, a fiber optic endoscope (a small pencil-sized telescope) is inserted and a catheter is put into ducts for examination. This test can reveal stones, strictures, cysts, tumors, and other irregularities within the pancreatic ducts and bile ducts. PREPARATION FOR TEST Do not eat or drink after midnight the day before the test.  NORMAL FINDINGS Normal size of  biliary and pancreatic ducts. No obstruction or filling defects within the biliary or pancreatic ducts. Ranges for normal findings may vary among different laboratories and hospitals. You should always check with your doctor after having lab work or other tests done to discuss the meaning of your test results and whether your values are considered within normal limits. MEANING OF TEST  Your caregiver will go over the test results with you and discuss the importance and meaning of your results, as well as treatment options and the need for additional tests if necessary. OBTAINING THE TEST RESULTS  It is your responsibility to obtain your test results. Ask the lab or department performing the test when and how you will get your results. Document Released: 01/20/2005 Document Revised: 06/08/2011 Document Reviewed: 09/05/2008 [facility] Patient Information 2012 [place], [place].PATIENT INSTRUCTIONS POST-ANESTHESIA  IMMEDIATELY FOLLOWING SURGERY:  Do not drive or operate machinery for the first twenty four hours after surgery.  Do not make any important decisions for twenty four hours after surgery or while taking narcotic pain medications or sedatives.  If you develop intractable nausea and vomiting or a severe headache please notify your doctor immediately.  FOLLOW-UP:  Please make an appointment with your surgeon as instructed. You do not need to follow up with anesthesia unless specifically instructed to do so.  WOUND CARE INSTRUCTIONS (if applicable):  Keep a dry clean dressing on the anesthesia/puncture wound site if there is drainage.  Once the wound has quit draining you may leave  it open to air.  Generally you should leave the bandage intact for twenty four hours unless there is drainage.  If the epidural site drains for more than 36-48 hours please call the anesthesia department.  QUESTIONS?:  Please feel free to call your physician or the hospital operator if you have any questions, and they will  be happy to assist you.     [facility] Anesthesia Department [address] [place] [phone]

## 2011-08-12 ENCOUNTER — Encounter (HOSPITAL_COMMUNITY): Payer: Self-pay | Admitting: Anesthesiology

## 2011-08-12 ENCOUNTER — Ambulatory Visit (HOSPITAL_COMMUNITY)
Admission: RE | Admit: 2011-08-12 | Discharge: 2011-08-12 | Disposition: A | Discharge status: Home / Self Care | Payer: Self-pay | Source: Ambulatory Visit | Attending: Internal Medicine | Admitting: Internal Medicine

## 2011-08-12 ENCOUNTER — Ambulatory Visit (HOSPITAL_COMMUNITY): Payer: Self-pay | Admitting: Anesthesiology

## 2011-08-12 ENCOUNTER — Ambulatory Visit (HOSPITAL_COMMUNITY): Payer: Self-pay

## 2011-08-12 ENCOUNTER — Encounter (HOSPITAL_COMMUNITY)
Admission: RE | Disposition: A | Discharge status: Home / Self Care | Payer: Self-pay | Source: Ambulatory Visit | Attending: Internal Medicine

## 2011-08-12 DIAGNOSIS — Z01812 Encounter for preprocedural laboratory examination: Secondary | ICD-10-CM | POA: Insufficient documentation

## 2011-08-12 DIAGNOSIS — R7402 Elevation of levels of lactic acid dehydrogenase (LDH): Secondary | ICD-10-CM | POA: Insufficient documentation

## 2011-08-12 DIAGNOSIS — R7401 Elevation of levels of liver transaminase levels: Secondary | ICD-10-CM | POA: Insufficient documentation

## 2011-08-12 DIAGNOSIS — R859 Unspecified abnormal finding in specimens from digestive organs and abdominal cavity: Secondary | ICD-10-CM

## 2011-08-12 DIAGNOSIS — R1011 Right upper quadrant pain: Secondary | ICD-10-CM | POA: Insufficient documentation

## 2011-08-12 DIAGNOSIS — K8020 Calculus of gallbladder without cholecystitis without obstruction: Secondary | ICD-10-CM

## 2011-08-12 HISTORY — PX: SPHINCTEROTOMY: SHX5544

## 2011-08-12 HISTORY — PX: ERCP W/ SPHICTEROTOMY: SHX1523

## 2011-08-12 HISTORY — PX: ERCP: SHX5425

## 2011-08-12 SURGERY — ERCP, WITH INTERVENTION IF INDICATED
Anesthesia: General

## 2011-08-12 MED ORDER — GLUCAGON HCL (RDNA) 1 MG IJ SOLR
INTRAMUSCULAR | Status: DC | PRN
  Administered 2011-08-12 (×3): .25 mg via INTRAVENOUS

## 2011-08-12 MED ORDER — ONDANSETRON HCL 4 MG/2ML IJ SOLN
INTRAMUSCULAR | Status: AC
  Filled 2011-08-12: qty 2

## 2011-08-12 MED ORDER — PROPOFOL 10 MG/ML IV EMUL
INTRAVENOUS | Status: AC
  Filled 2011-08-12: qty 20

## 2011-08-12 MED ORDER — STERILE WATER FOR IRRIGATION IR SOLN
Status: DC | PRN
  Administered 2011-08-12: 10:00:00

## 2011-08-12 MED ORDER — ONDANSETRON HCL 4 MG/2ML IJ SOLN: 4.0000 mg | Freq: Once | INTRAMUSCULAR | Status: DC | PRN

## 2011-08-12 MED ORDER — SUCCINYLCHOLINE CHLORIDE 20 MG/ML IJ SOLN
INTRAMUSCULAR | Status: AC
  Filled 2011-08-12: qty 1

## 2011-08-12 MED ORDER — SUCCINYLCHOLINE CHLORIDE 20 MG/ML IJ SOLN
INTRAMUSCULAR | Status: DC | PRN
Start: 2011-08-12 — End: 2011-08-12
  Administered 2011-08-12: 140 mg via INTRAVENOUS

## 2011-08-12 MED ORDER — GLYCOPYRROLATE 0.2 MG/ML IJ SOLN
INTRAMUSCULAR | Status: DC | PRN
  Administered 2011-08-12: .2 mg via INTRAVENOUS

## 2011-08-12 MED ORDER — NEOSTIGMINE METHYLSULFATE 1 MG/ML IJ SOLN
INTRAMUSCULAR | Status: AC
  Filled 2011-08-12: qty 10

## 2011-08-12 MED ORDER — PROPOFOL 10 MG/ML IV EMUL
INTRAVENOUS | Status: DC | PRN
  Administered 2011-08-12: 140 mg via INTRAVENOUS
  Administered 2011-08-12: 30 mg via INTRAVENOUS

## 2011-08-12 MED ORDER — NEOSTIGMINE METHYLSULFATE 1 MG/ML IJ SOLN
INTRAMUSCULAR | Status: DC | PRN
  Administered 2011-08-12: 1 mg via INTRAVENOUS

## 2011-08-12 MED ORDER — CEFAZOLIN SODIUM 1-5 GM-% IV SOLN
INTRAVENOUS | Status: AC
  Administered 2011-08-12: 1 g via INTRAVENOUS
  Filled 2011-08-12: qty 50

## 2011-08-12 MED ORDER — ACETAMINOPHEN 325 MG PO TABS: 325.0000 mg | ORAL_TABLET | ORAL | Status: DC | PRN

## 2011-08-12 MED ORDER — ROCURONIUM BROMIDE 100 MG/10ML IV SOLN
INTRAVENOUS | Status: DC | PRN
  Administered 2011-08-12: 20 mg via INTRAVENOUS
  Administered 2011-08-12: 5 mg via INTRAVENOUS

## 2011-08-12 MED ORDER — MIDAZOLAM HCL 2 MG/2ML IJ SOLN
INTRAMUSCULAR | Status: AC
  Filled 2011-08-12: qty 2

## 2011-08-12 MED ORDER — ROCURONIUM BROMIDE 50 MG/5ML IV SOLN
INTRAVENOUS | Status: AC
  Filled 2011-08-12: qty 1

## 2011-08-12 MED ORDER — GLYCOPYRROLATE 0.2 MG/ML IJ SOLN
0.2000 mg | Freq: Once | INTRAMUSCULAR | Status: AC | PRN
  Administered 2011-08-12: 0.2 mg via INTRAVENOUS

## 2011-08-12 MED ORDER — PANTOPRAZOLE SODIUM 40 MG PO TBEC: 40.0000 mg | DELAYED_RELEASE_TABLET | Freq: Every day | ORAL | Status: DC

## 2011-08-12 MED ORDER — MIDAZOLAM HCL 2 MG/2ML IJ SOLN
1.0000 mg | INTRAMUSCULAR | Status: DC | PRN
  Administered 2011-08-12: 2 mg via INTRAVENOUS

## 2011-08-12 MED ORDER — GLYCOPYRROLATE 0.2 MG/ML IJ SOLN
INTRAMUSCULAR | Status: AC
  Filled 2011-08-12: qty 1

## 2011-08-12 MED ORDER — LACTATED RINGERS IV SOLN
INTRAVENOUS | Status: DC
  Administered 2011-08-12: 09:00:00 via INTRAVENOUS

## 2011-08-12 MED ORDER — FENTANYL CITRATE 0.05 MG/ML IJ SOLN
INTRAMUSCULAR | Status: DC | PRN
  Administered 2011-08-12 (×2): 50 ug via INTRAVENOUS

## 2011-08-12 MED ORDER — SODIUM CHLORIDE 0.9 % IV SOLN
INTRAVENOUS | Status: DC | PRN
  Administered 2011-08-12: 10:00:00

## 2011-08-12 MED ORDER — ONDANSETRON HCL 4 MG/2ML IJ SOLN
4.0000 mg | Freq: Once | INTRAMUSCULAR | Status: AC
  Administered 2011-08-12: 4 mg via INTRAVENOUS

## 2011-08-12 MED ORDER — FENTANYL CITRATE 0.05 MG/ML IJ SOLN: 25.0000 ug | INTRAMUSCULAR | Status: DC | PRN

## 2011-08-12 MED ORDER — FENTANYL CITRATE 0.05 MG/ML IJ SOLN
INTRAMUSCULAR | Status: AC
  Filled 2011-08-12: qty 5

## 2011-08-12 SURGICAL SUPPLY — 27 items
BAG HAMPER (MISCELLANEOUS) ×3 IMPLANT
BALLN RETRIEVAL 12X15 (BALLOONS) ×1 IMPLANT
BALN RTRVL 200 6-7FR 12-15 (BALLOONS) ×2
BASKET TRAPEZOID 3X6 (MISCELLANEOUS) IMPLANT
BSKT STON RTRVL TRAPEZOID 3X6 (MISCELLANEOUS)
DEVICE CLIP HEMOSTAT 235CM (CLIP) ×1 IMPLANT
DEVICE INFLATION ENCORE 26 (MISCELLANEOUS) IMPLANT
DEVICE LOCKING W-BIOPSY CAP (MISCELLANEOUS) ×3 IMPLANT
GUIDEWIRE HYDRA JAGWIRE .35 (WIRE) IMPLANT
GUIDEWIRE JAG HINI 025X260CM (WIRE) IMPLANT
KIT ROOM TURNOVER APOR (KITS) ×3 IMPLANT
LUBRICANT JELLY 4.5OZ STERILE (MISCELLANEOUS) ×3 IMPLANT
NDL HYPO 18GX1.5 BLUNT FILL (NEEDLE) ×1 IMPLANT
NEEDLE HYPO 18GX1.5 BLUNT FILL (NEEDLE) ×3 IMPLANT
PAD ARMBOARD 7.5X6 YLW CONV (MISCELLANEOUS) ×3 IMPLANT
PATHFINDER 450CM 0.18 (STENTS) ×3 IMPLANT
POSITIONER HEAD 8X9X4 ADT (SOFTGOODS) ×3 IMPLANT
SNARE ROTATE MED OVAL 20MM (MISCELLANEOUS) IMPLANT
SPHINCTEROTOME AUTOTOME .25 (MISCELLANEOUS) IMPLANT
SPHINCTEROTOME HYDRATOME 44 (MISCELLANEOUS) ×1 IMPLANT
SPONGE GAUZE 4X4 12PLY (GAUZE/BANDAGES/DRESSINGS) ×3 IMPLANT
SYR 3ML LL SCALE MARK (SYRINGE) ×3 IMPLANT
SYR 50ML LL SCALE MARK (SYRINGE) ×3 IMPLANT
SYSTEM CONTINUOUS INJECTION (MISCELLANEOUS) ×3 IMPLANT
WALLSTENT METAL COVERED 10X60 (STENTS) IMPLANT
WALLSTENT METAL COVERED 10X80 (STENTS) IMPLANT
WATER STERILE IRR 1000ML POUR (IV SOLUTION) ×3 IMPLANT

## 2011-08-12 NOTE — Op Note (Signed)
Mallory [name], Mallory [name]                ACCOUNT NO.:  [id number]  MEDICAL RECORD NO.:  [id number]  LOCATION:  [facility]                          FACILITY:  [facility]  PHYSICIAN:  Lionel December, M.D.    DATE OF BIRTH:  Mar 15, 1956  DATE OF PROCEDURE:  08/12/2011 DATE OF DISCHARGE:  08/12/2011                              OPERATIVE REPORT   PROCEDURE:  Endoscopic retrograde cholangiopancreatography with biliary sphincterotomy.  INDICATION:  The patient is a 55 year old Caucasian female with 2 episodes of right upper quadrant pain associated with bump in her transaminases.  Ultrasound was negative.  She is suspected to either have microlithiasis or sphincter Foley dysfunction.  She is undergoing diagnostic therapeutic ERCP.  Procedure risks were reviewed with the patient.  Informed consent was obtained.  MEDICATIONS FOR SEDATION/ANESTHESIA:  Please see Anesthesia records for details.  FINDINGS:  Procedure performed in the OR.  The patient was placed under anesthesia, intubated, and turned into semiprone position.  Pentax video duodenal scope was passed through oropharynx without any difficulty into esophagus and stomach and across the pylorus into the second part of the duodenum.  Ampulla water appeared to be normal.  Cannulation was attempted with Rx 44 Autotome with 035 hydra Jagwire.  CBD was easily cannulated.  Dilated common contrast was injected and biliary system filled.  CBD was 6-7 mm.  Intrahepatic biliary radicals were normal.  No filling defects noted.  Biliary sphincterotomy was performed.  There was minimal oozing which stops pain spontaneously and did not required intervention.  Stone balloon extractor was trolled through the bile duct.  There were no stones.  Pancreatic duct was not filled with contrast or cannulated.  Endoscope was withdrawn.  The patient tolerated the procedure well.  I was planning to place a Hemoclip at the roof to fill up, this was not done because oozing  stopped spontaneously.  FINAL DIAGNOSES: 1. Normal cholangiogram. 2. Recurrent symptoms felt to be secondary to sphincter of Oddi     dysfunction. 3. Biliary sphincterotomy performed.  RECOMMENDATIONS: 1. No aspirin for 5 days. 2. Clear liquids today. 3. We will start on pantoprazole 40 mg p.o. q.a.m. for heartburn as     OTC therapy is not working.          ______________________________ Lionel December, M.D.     NR/MEDQ  D:  08/12/2011  T:  08/12/2011  Job:  [id number]  cc:   Mallory [name]. Mallory Gambler, MD Fax: [phone]

## 2011-08-12 NOTE — OR Nursing (Signed)
Per Dr. Karilyn Cota  Ancef  1 gram and no SCD's.  Luster Landsberg notified of change in order.

## 2011-08-12 NOTE — H&P (Signed)
Mallory [name] is an 55 y.o. female.   Chief Complaint: Patient is here for ERCP and possible sphincterotomy. HPI: Patient is 54 year old Caucasian female who had her gallbladder removed 15 years ago is at 2 episodes of right upper quadrant pain. First episode occurred about 2 months ago when she was seen at [facility] medical and had elevated transaminases. She had upper abdominal ultrasound which is negative for choledocholithiasis. She was seen in our office on 07/21/2011 and her transaminases were normal. We felt that she passed the stone spontaneously. She another episode of pain a few days ago the bump in her transaminases. Patient states her symptoms lasted for about a week the first time she noted to change in the color of urine and she also had fever and stomach symptoms lasted 2 days. She complains of mild pain in right upper quadrant. She states her heartburn is not well controlled with medication. She does not have a good appetite however she has not lost any weight.  Past Medical History  Diagnosis Date  . Pulmonary embolism     5 yrs ago (unknown region)  . GERD (gastroesophageal reflux disease)   . Pulmonary embolus 5 yrs ago    Past Surgical History  Procedure Date  . Cesarean section 1991    [facility]  . Cholecystectomy 15 yrs ago    [facility]    Family History  Problem Relation Age of Onset  . Diabetes Mother   . Heart failure Father   . Diabetes Brother   . Anesthesia problems Neg Hx   . Hypotension Neg Hx   . Malignant hyperthermia Neg Hx   . Pseudochol deficiency Neg Hx    Social History:  reports that she has never smoked. She does not have any smokeless tobacco history on file. She reports that she does not drink alcohol or use illicit drugs.  Allergies:  Allergies  Allergen Reactions  . Hydromorphone Itching    Medications Prior to Admission  Medication Dose Route Frequency Provider Last Rate Last Dose  . ceFAZolin (ANCEF) 1-5 GM-% IVPB           . propofol (DIPRIVAN)  10 MG/ML infusion           . rocuronium (ZEMURON) 50 MG/5ML injection            Medications Prior to Admission  Medication Sig Dispense Refill  . ibuprofen (ADVIL,MOTRIN) 200 MG tablet Take 400 mg by mouth every 6 (six) hours as needed. FOR PAIN       . omeprazole (PRILOSEC) 20 MG capsule Take 20 mg by mouth daily as needed. FOR ACID REFLUX         No results found for this or any previous visit (from the past 48 hour(s)). No results found.  Review of Systems  Gastrointestinal: Positive for heartburn (heartburn not well controlled with OTC meds). Negative for constipation, blood in stool and melena.    Blood pressure 122/78, pulse 57, temperature 98.7 F (37.1 C), temperature source Oral, resp. rate 23, height 5\' 3"  (1.6 m), weight 102 lb (46.267 kg), last menstrual period 05/11/2011, SpO2 96.00%. Physical Exam  Constitutional: She appears well-developed and well-nourished.  HENT:  Mouth/Throat: Oropharynx is clear and moist.  Eyes: Conjunctivae are normal. No scleral icterus.  Neck: No thyromegaly present.  Cardiovascular: Normal rate, regular rhythm and normal heart sounds.   No murmur heard. Respiratory: Effort normal and breath sounds normal.  GI: Soft. She exhibits no distension and no mass. There is  Tenderness: mild tenderness at right upper quadrant and epigastric region.. There is no rebound.  Musculoskeletal: She exhibits no edema.  Lymphadenopathy:    She has no cervical adenopathy.  Neurological: She is alert.  Skin: Skin is warm and dry.     Assessment/Plan Recurrent biliary colic associated with bump in her transaminases. Given negative hepatobiliary ultrasound via either dealing with microlithiasis (choledocholithiasis) or SOD dysfunction. Therefore  patient will benefit from ERCP with sphincterotomy. I have viewed her procedure in detail with the patient along with the risks including the risk of pancreatitis. I have told patient that pancreatic stent would be  placed only if elevation of bile duct is difficult as would be for prophylactic purposes. Patient is agreeable to proceed with ERCP   Osiris Charles U 08/12/2011, 8:50 AM

## 2011-08-12 NOTE — Transfer of Care (Signed)
Immediate Anesthesia Transfer of Care Note  Patient: Mallory [name]  Procedure(s) Performed:  ENDOSCOPIC RETROGRADE CHOLANGIOPANCREATOGRAPHY (ERCP); SPHINCTEROTOMY - with ballon passage  Patient Location: PACU  Anesthesia Type: General  Level of Consciousness: awake  Airway & Oxygen Therapy: Patient Spontanous Breathing and non-rebreather face mask  Post-op Assessment: Report given to PACU RN, Post -op Vital signs reviewed and stable and Patient moving all extremities  Post vital signs: Reviewed and stable  Complications: No apparent anesthesia complications

## 2011-08-12 NOTE — Anesthesia Preprocedure Evaluation (Signed)
Anesthesia Evaluation  Patient identified by MRN, date of birth, ID band Patient awake    Reviewed: Allergy & Precautions, H&P , NPO status , Patient's Chart, lab work & pertinent test results  Airway Mallampati: I TM Distance: >3 FB Neck ROM: Full    Dental  (+) Edentulous Upper   Pulmonary    Pulmonary exam normal       Cardiovascular Regular Normal    Neuro/Psych Negative Neurological ROS  Negative Psych ROS   GI/Hepatic Neg liver ROS, GERD-  Medicated and Controlled,  Endo/Other  Negative Endocrine ROS  Renal/GU negative Renal ROS  Genitourinary negative   Musculoskeletal negative musculoskeletal ROS (+)   Abdominal Normal abdominal exam  (+)   Peds  Hematology negative hematology ROS (+)   Anesthesia Other Findings   Reproductive/Obstetrics                           Anesthesia Physical Anesthesia Plan  ASA: II  Anesthesia Plan: General   Post-op Pain Management:    Induction: Intravenous, Rapid sequence and Cricoid pressure planned  Airway Management Planned: Oral ETT  Additional Equipment:   Intra-op Plan:   Post-operative Plan: Extubation in OR  Informed Consent: I have reviewed the patients History and Physical, chart, labs and discussed the procedure including the risks, benefits and alternatives for the proposed anesthesia with the patient or authorized representative who has indicated his/her understanding and acceptance.     Plan Discussed with: CRNA  Anesthesia Plan Comments:         Anesthesia Quick Evaluation

## 2011-08-12 NOTE — Anesthesia Postprocedure Evaluation (Signed)
Anesthesia Post Note  Patient: Mallory [name]  Procedure(s) Performed:  ENDOSCOPIC RETROGRADE CHOLANGIOPANCREATOGRAPHY (ERCP); SPHINCTEROTOMY - with ballon passage  Anesthesia type: General  Patient location: PACU  Post pain: Pain level controlled  Post assessment: Post-op Vital signs reviewed, Patient's Cardiovascular Status Stable, Respiratory Function Stable, Patent Airway, No signs of Nausea or vomiting and Pain level controlled  Last Vitals:  Filed Vitals:   08/12/11 1038  BP: 114/71  Pulse: 77  Temp: 36.5 C  Resp: 20    Post vital signs: Reviewed and stable  Level of consciousness: awake and alert   Complications: No apparent anesthesia complications

## 2011-08-12 NOTE — Brief Op Note (Signed)
08/12/2011  10:18 AM  PATIENT:  Mallory [name]  55 y.o. female  PRE-OPERATIVE DIAGNOSIS:  * No pre-op diagnosis entered *  POST-OPERATIVE DIAGNOSIS:  elevated LFT's, RUQ abd pain  PROCEDURE:  Procedure(s): ENDOSCOPIC RETROGRADE CHOLANGIOPANCREATOGRAPHY (ERCP) SPHINCTEROTOMY  SURGEON:  Surgeon(s): Malissa Hippo, MD   ERCP note. Imitated view of esophageal mucosa reveals no abnormality No mucosal abnormality noted to mucosa and pyloric channel. Normal ampulla of Vater. CBD 7 mm. No filling defects noted. Biliary sphincterotomy performed. No stones noted on passing the dilator through the bile duct. Pancreatic duct was not cannulated or filled with contrast. She tolerated the procedure well

## 2011-08-12 NOTE — Anesthesia Procedure Notes (Signed)
Procedure Name: Intubation Date/Time: 08/12/2011 9:35 AM Performed by: Minerva Areola Pre-anesthesia Checklist: Patient identified, Patient being monitored, Timeout performed, Emergency Drugs available and Suction available Patient Re-evaluated:Patient Re-evaluated prior to inductionOxygen Delivery Method: Circle System Utilized Preoxygenation: Pre-oxygenation with 100% oxygen Intubation Type: Rapid sequence and Circoid Pressure applied Laryngoscope Size: Miller and 2 Grade View: Grade I Tube type: Oral Tube size: 7.0 mm Number of attempts: 1 Airway Equipment and Method: stylet Placement Confirmation: ETT inserted through vocal cords under direct vision,  positive ETCO2 and breath sounds checked- equal and bilateral Secured at: 21 cm Tube secured with: Tape Dental Injury: Teeth and Oropharynx as per pre-operative assessment

## 2011-08-14 ENCOUNTER — Emergency Department (HOSPITAL_COMMUNITY): Payer: Self-pay

## 2011-08-14 ENCOUNTER — Inpatient Hospital Stay (HOSPITAL_COMMUNITY)
Admission: EM | Admit: 2011-08-14 | Discharge: 2011-08-20 | DRG: 921 | Disposition: A | Discharge status: Home / Self Care | Payer: Self-pay | Attending: Internal Medicine | Admitting: Internal Medicine

## 2011-08-14 ENCOUNTER — Telehealth: Payer: Self-pay | Admitting: Gastroenterology

## 2011-08-14 ENCOUNTER — Encounter (HOSPITAL_COMMUNITY): Payer: Self-pay | Admitting: Emergency Medicine

## 2011-08-14 DIAGNOSIS — K631 Perforation of intestine (nontraumatic): Secondary | ICD-10-CM | POA: Diagnosis present

## 2011-08-14 DIAGNOSIS — R1011 Right upper quadrant pain: Secondary | ICD-10-CM

## 2011-08-14 DIAGNOSIS — I2699 Other pulmonary embolism without acute cor pulmonale: Secondary | ICD-10-CM

## 2011-08-14 DIAGNOSIS — IMO0002 Reserved for concepts with insufficient information to code with codable children: Principal | ICD-10-CM | POA: Diagnosis present

## 2011-08-14 DIAGNOSIS — K219 Gastro-esophageal reflux disease without esophagitis: Secondary | ICD-10-CM | POA: Diagnosis present

## 2011-08-14 DIAGNOSIS — R7989 Other specified abnormal findings of blood chemistry: Secondary | ICD-10-CM

## 2011-08-14 DIAGNOSIS — Z86711 Personal history of pulmonary embolism: Secondary | ICD-10-CM | POA: Insufficient documentation

## 2011-08-14 LAB — COMPREHENSIVE METABOLIC PANEL
ALT: 36 U/L — ABNORMAL HIGH (ref 0–35)
AST: 21 U/L (ref 0–37)
Albumin: 3.5 g/dL (ref 3.5–5.2)
Alkaline Phosphatase: 107 U/L (ref 39–117)
CO2: 30 mEq/L (ref 19–32)
Chloride: 102 mEq/L (ref 96–112)
Creatinine, Ser: 0.82 mg/dL (ref 0.50–1.10)
GFR calc non Af Amer: 80 mL/min — ABNORMAL LOW (ref >90)
Potassium: 4.3 mEq/L (ref 3.5–5.1)
Sodium: 138 mEq/L (ref 135–145)
Total Bilirubin: 0.6 mg/dL (ref 0.3–1.2)

## 2011-08-14 LAB — CBC
HCT: 46.6 % — ABNORMAL HIGH (ref 36.0–46.0)
MCHC: 33.5 g/dL (ref 30.0–36.0)
RDW: 13.5 % (ref 11.5–15.5)
WBC: 8.3 10*3/uL (ref 4.0–10.5)

## 2011-08-14 LAB — DIFFERENTIAL
Basophils Absolute: 0 10*3/uL (ref 0.0–0.1)
Basophils Relative: 1 % (ref 0–1)
Lymphocytes Relative: 27 % (ref 12–46)
Monocytes Absolute: 0.5 10*3/uL (ref 0.1–1.0)
Neutro Abs: 5.2 10*3/uL (ref 1.7–7.7)
Neutrophils Relative %: 63 % (ref 43–77)

## 2011-08-14 LAB — URINALYSIS, ROUTINE W REFLEX MICROSCOPIC
Glucose, UA: NEGATIVE mg/dL
Ketones, ur: NEGATIVE mg/dL
Leukocytes, UA: NEGATIVE
Nitrite: NEGATIVE
Protein, ur: NEGATIVE mg/dL
pH: 6.5 (ref 5.0–8.0)

## 2011-08-14 MED ORDER — PIPERACILLIN-TAZOBACTAM 3.375 G IVPB
3.3750 g | Freq: Once | INTRAVENOUS | Status: AC
  Administered 2011-08-14: 3.375 g via INTRAVENOUS
  Filled 2011-08-14: qty 50

## 2011-08-14 MED ORDER — MORPHINE SULFATE 2 MG/ML IJ SOLN
INTRAMUSCULAR | Status: AC
  Administered 2011-08-14: 2 mg via INTRAVENOUS
  Filled 2011-08-14: qty 1

## 2011-08-14 MED ORDER — KCL IN DEXTROSE-NACL 40-5-0.9 MEQ/L-%-% IV SOLN
INTRAVENOUS | Status: AC
  Filled 2011-08-14: qty 1000

## 2011-08-14 MED ORDER — KCL IN DEXTROSE-NACL 40-5-0.9 MEQ/L-%-% IV SOLN
INTRAVENOUS | Status: DC
  Administered 2011-08-14: 18:00:00 via INTRAVENOUS
  Administered 2011-08-15: 75 mL via INTRAVENOUS
  Administered 2011-08-16 – 2011-08-18 (×4): via INTRAVENOUS
  Filled 2011-08-14 (×20): qty 1000

## 2011-08-14 MED ORDER — ONDANSETRON HCL 4 MG/2ML IJ SOLN
4.0000 mg | Freq: Once | INTRAMUSCULAR | Status: AC
  Administered 2011-08-14: 4 mg via INTRAVENOUS
  Filled 2011-08-14: qty 2

## 2011-08-14 MED ORDER — FENTANYL CITRATE 0.05 MG/ML IJ SOLN
75.0000 ug | Freq: Once | INTRAMUSCULAR | Status: AC
  Administered 2011-08-14: 15:00:00 via INTRAVENOUS
  Filled 2011-08-14: qty 2

## 2011-08-14 MED ORDER — PROMETHAZINE HCL 12.5 MG PO TABS: 12.5000 mg | ORAL_TABLET | Freq: Four times a day (QID) | ORAL | Status: DC | PRN

## 2011-08-14 MED ORDER — FENTANYL CITRATE 0.05 MG/ML IJ SOLN
75.0000 ug | Freq: Once | INTRAMUSCULAR | Status: AC
  Administered 2011-08-14: 12:00:00 via INTRAVENOUS
  Filled 2011-08-14: qty 2

## 2011-08-14 MED ORDER — ONDANSETRON HCL 4 MG/2ML IJ SOLN: 4.0000 mg | Freq: Four times a day (QID) | INTRAMUSCULAR | Status: DC | PRN

## 2011-08-14 MED ORDER — INFLUENZA VIRUS VACC SPLIT PF IM SUSP
0.5000 mL | Freq: Once | INTRAMUSCULAR | Status: AC
  Administered 2011-08-15: 0.5 mL via INTRAMUSCULAR
  Filled 2011-08-14: qty 0.5

## 2011-08-14 MED ORDER — PANTOPRAZOLE SODIUM 40 MG IV SOLR
40.0000 mg | Freq: Two times a day (BID) | INTRAVENOUS | Status: DC
  Administered 2011-08-14 – 2011-08-19 (×10): 40 mg via INTRAVENOUS
  Filled 2011-08-14 (×10): qty 40

## 2011-08-14 MED ORDER — MORPHINE SULFATE 4 MG/ML IJ SOLN: 2.0000 mg | INTRAMUSCULAR | Status: DC | PRN

## 2011-08-14 MED ORDER — MORPHINE SULFATE 2 MG/ML IJ SOLN
2.0000 mg | INTRAMUSCULAR | Status: DC | PRN
  Administered 2011-08-14 – 2011-08-17 (×9): 2 mg via INTRAVENOUS
  Filled 2011-08-14 (×9): qty 1

## 2011-08-14 MED ORDER — PROMETHAZINE HCL 25 MG/ML IJ SOLN: 12.5000 mg | Freq: Four times a day (QID) | INTRAMUSCULAR | Status: DC | PRN

## 2011-08-14 MED ORDER — IOHEXOL 300 MG/ML  SOLN
100.0000 mL | Freq: Once | INTRAMUSCULAR | Status: AC | PRN
  Administered 2011-08-14: 100 mL via INTRAVENOUS

## 2011-08-14 NOTE — ED Provider Notes (Signed)
History     CSN: [id number] Arrival date & time: 08/14/2011 10:25 AM   First MD Initiated Contact with Patient 08/14/11 1042      Chief Complaint  Patient presents with  . Abdominal Pain    (Consider location/radiation/quality/duration/timing/severity/associated sxs/prior treatment) Patient is a 55 y.o. female presenting with abdominal pain. The history is provided by the patient.  Abdominal Pain The primary symptoms of the illness include abdominal pain and nausea. The primary symptoms of the illness do not include fever, shortness of breath, vomiting or diarrhea. The current episode started yesterday. The onset of the illness was gradual. The problem has been gradually worsening.  The abdominal pain is located in the epigastric region. The abdominal pain does not radiate. The severity of the abdominal pain is 8/10. The abdominal pain is relieved by nothing. The abdominal pain is exacerbated by movement, deep breathing and certain positions (palpation).  Associated with: She had an ERCP with sphincterotomy 2 days ,   due to elevated liver enzymes and pain,  pt states there were no gallstones found. The patient states that she believes she is currently not pregnant. Symptoms associated with the illness do not include chills, diaphoresis, heartburn, constipation or urgency. Associated symptoms comments: Nausea without emesis,  No fever,  No diarrhea.  Last bm was 3 days ago. . Significant associated medical issues include GERD and gallstones.    Past Medical History  Diagnosis Date  . Pulmonary embolism     5 yrs ago (unknown region)  . GERD (gastroesophageal reflux disease)   . Pulmonary embolus 5 yrs ago    Past Surgical History  Procedure Date  . Cesarean section 1991    [facility]  . Cholecystectomy 15 yrs ago    [facility]  . Esophagogastroduodenoscopy 08/12/11    Family History  Problem Relation Age of Onset  . Diabetes Mother   . Heart failure Father   . Diabetes Brother   .  Anesthesia problems Neg Hx   . Hypotension Neg Hx   . Malignant hyperthermia Neg Hx   . Pseudochol deficiency Neg Hx     History  Substance Use Topics  . Smoking status: Never Smoker   . Smokeless tobacco: Not on file  . Alcohol Use: No    OB History    Grav Para Term Preterm Abortions TAB SAB Ect Mult Living                  Review of Systems  Constitutional: Negative for fever, chills and diaphoresis.  HENT: Negative for congestion, sore throat and neck pain.   Eyes: Negative.   Respiratory: Negative for chest tightness and shortness of breath.   Cardiovascular: Negative for chest pain.  Gastrointestinal: Positive for nausea and abdominal pain. Negative for heartburn, vomiting, diarrhea and constipation.  Genitourinary: Negative.  Negative for urgency.  Musculoskeletal: Negative for joint swelling and arthralgias.  Skin: Negative.  Negative for rash and wound.  Neurological: Negative for dizziness, weakness, light-headedness, numbness and headaches.  Hematological: Negative.   Psychiatric/Behavioral: Negative.     Allergies  Hydromorphone  Home Medications   Current Outpatient Rx  Name Route Sig Dispense Refill  . PANTOPRAZOLE SODIUM 40 MG PO TBEC Oral Take 1 tablet (40 mg total) by mouth daily. 30 tablet 5    BP 126/75  Pulse 63  Temp(Src) 97.7 F (36.5 C) (Oral)  Resp 17  Ht 5\' 3"  (1.6 m)  Wt 182 lb (82.555 kg)  BMI 32.24 kg/m2  SpO2 99%  LMP 04/09/2011  Physical Exam  Nursing note and vitals reviewed. Constitutional: She is oriented to person, place, and time. She appears well-developed and well-nourished.  HENT:  Head: Normocephalic and atraumatic.  Eyes: Conjunctivae are normal.  Neck: Normal range of motion.  Cardiovascular: Normal rate, regular rhythm, normal heart sounds and intact distal pulses.   Pulmonary/Chest: Effort normal and breath sounds normal. She has no wheezes.  Abdominal: Soft. Bowel sounds are normal. She exhibits no distension  and no mass. There is no hepatosplenomegaly. There is tenderness in the epigastric area and left upper quadrant. There is no rigidity, no rebound, no guarding, no CVA tenderness and negative Murphy's sign. No hernia.  Musculoskeletal: Normal range of motion.  Neurological: She is alert and oriented to person, place, and time.  Skin: Skin is warm and dry.  Psychiatric: She has a normal mood and affect.    ED Course  Procedures (including critical care time)  Labs Reviewed  CBC - Abnormal; Notable for the following:    RBC 5.51 (*)    Hemoglobin 15.6 (*)    HCT 46.6 (*)    All other components within normal limits  COMPREHENSIVE METABOLIC PANEL - Abnormal; Notable for the following:    Glucose, Bld 115 (*)    ALT 36 (*)    GFR calc non Af Amer 80 (*)    All other components within normal limits  URINALYSIS, ROUTINE W REFLEX MICROSCOPIC  DIFFERENTIAL  LIPASE, BLOOD   Dg Abd Acute W/chest  08/14/2011  *RADIOLOGY REPORT*  Clinical Data: Abdominal pain.  Recent ERCP.  Next field none  ACUTE ABDOMEN SERIES (ABDOMEN 2 VIEW & CHEST 1 VIEW)  Comparison: None  Findings: Bowel gas pattern does not show evidence of ileus, obstruction or free air.  There is a moderate amount of fecal matter in the right colon.  No worrisome calcifications or bony findings.  One-view chest shows normal heart and mediastinal shadows.  There are chronic interstitial lung markings at the bases.  No free air.  IMPRESSION: No acute finding by plain radiography.  Moderate amount of fecal matter in the right colon.  Original Report Authenticated By: Thomasenia Sales, M.D.     No diagnosis found.  Discussed case with Dr Darrick Penna,  On call for Dr.Ramon today - would recommend Ct abd/pelvis to rule out microperf or other acute process.  If neg,  Treat pain,  Pt can go home and f/u with Dr Gerilyn Nestle tomorrow.  2:50 PM  Patient drinking contrast,  Increased pain,  Nausea.  Will repeat fentanyl,  Zofran.  3:50 pm - Ct scan shows  microperf.  Dr. Gerilyn Nestle called,  Recommended zosyn,  Admit to hospitalists - he will call Dr fields back for consulting.  Call to Dr Karilyn Cota with [facility] - will admit patient.   MDM  Abdominal microperforation s/p GI procedure.        Candis Musa, PA 08/14/11 1552

## 2011-08-14 NOTE — Telephone Encounter (Signed)
PT CALLED AT 1 am THIS MORNING STATING SHE WAS HAVING "SPASM" in her abdomen and abd pain. Pt s/p ERCP/sX FOR ?MICROLITHIASIS V. Sod. Pt came to ed this morning. AAS neg and CMP/lipase nl. np-c called me. Asking for advice on additional workup. Pt should have CT ABD TO EVALUATE FOR FREE AIR. IF NEG PT CAN BE D/C TO HOME AND SHOULD CALL DR. Patty Sermons OFFICE ON 115.

## 2011-08-14 NOTE — H&P (Addendum)
Mallory [name] MRN: [id number] DOB/AGE: 05/15/1956 55 y.o. Primary Care Physician:FUSCO,LAWRENCE J., MD Admit date: 08/14/2011 Chief Complaint: Abdominal pain. HPI: This very pleasant 55 year old lady gives approximately a 12-24 hour history lady gives approximately a 53-24 hour history of generalized abdominal pain more on the left side and epigastric area. 2 days ago she had ERCP with sphincterectomy by Dr Karilyn Cota. She denies any vomiting although she has been somewhat nauseous. She has not been feeling feverish nor has she had sweating. She denies any chest pain, dyspnea or palpitations.    Past medical history: 1. Pulmonary embolism approximately 5 years ago. Unknown etiology. 2. Gastroesophageal reflux disease.  Past surgical history:  1. Cesarean section. 2. Cholecystectomy approximately 15 years ago.      Family history: Diabetes, heart failure.  Social history: She lives with her boyfriend. She does not smoke cigarettes. She occasionally drinks wine.  Allergies:  Allergies  Allergen Reactions  . Hydromorphone Itching    Medications Prior to Admission  Medication Dose Route Frequency Provider Last Rate Last Dose  . fentaNYL (SUBLIMAZE) injection 75 mcg  75 mcg Intravenous Once [employer], PA      . fentaNYL (SUBLIMAZE) injection 75 mcg  75 mcg Intravenous Once [employer], PA      . iohexol (OMNIPAQUE) 300 MG/ML injection 100 mL  100 mL Intravenous Once PRN Medication Radiologist   100 mL at 08/14/11 1513  . morphine 2 MG/ML injection        2 mg at 08/14/11 1127  . morphine 4 MG/ML injection 2 mg  2 mg Intravenous Q2H PRN Candis Musa, PA      . ondansetron (ZOFRAN) injection 4 mg  4 mg Intravenous Once Candis Musa, PA   4 mg at 08/14/11 1127  . ondansetron (ZOFRAN) injection 4 mg  4 mg Intravenous Once Candis Musa, PA   4 mg at 08/14/11 1518  . piperacillin-tazobactam (ZOSYN) IVPB 3.375 g  3.375 g Intravenous Once Candis Musa, PA 12.5 mL/hr at 08/14/11 1545 3.375 g at 08/14/11 1545   Medications Prior to  Admission  Medication Sig Dispense Refill  . pantoprazole (PROTONIX) 40 MG tablet Take 1 tablet (40 mg total) by mouth daily.  30 tablet  5       JXB:JYNWG from the symptoms mentioned above,there are no other symptoms referable to all systems reviewed.  Physical Exam: Blood pressure 126/75, pulse 63, temperature 97.7 F (36.5 C), temperature source Oral, resp. rate 17, height 5\' 3"  (1.6 m), weight 82.555 kg (182 lb), last menstrual period 04/09/2011, SpO2 99.00%. She looks systemically well. She is not toxic or septic. She does not appear to be in clinical shock. Her abdomen is soft and mildly tender in a generalized fashion. Bowel sounds are heard. There are no masses felt. Heart sounds are present and normal. There are no murmurs. Lung fields are clear. She is alert and orientated without any focal neurological signs.  Results for orders placed during the hospital encounter of 08/14/11 (from the past 48 hour(s))  CBC     Status: Abnormal   Collection Time   08/14/11 11:25 AM      Component Value Range Comment   WBC 8.3  4.0 - 10.5 (K/uL)    RBC 5.51 (*) 3.87 - 5.11 (MIL/uL)    Hemoglobin 15.6 (*) 12.0 - 15.0 (g/dL)    HCT 95.6 (*) 21.3 - 46.0 (%)    MCV 84.6  78.0 - 100.0 (fL)    MCH 28.3  26.0 -  34.0 (pg)    MCHC 33.5  30.0 - 36.0 (g/dL)    RDW 11.9  14.7 - 82.9 (%)    Platelets 234  150 - 400 (K/uL)   DIFFERENTIAL     Status: Normal   Collection Time   08/14/11 11:25 AM      Component Value Range Comment   Neutrophils Relative 63  43 - 77 (%)    Neutro Abs 5.2  1.7 - 7.7 (K/uL)    Lymphocytes Relative 27  12 - 46 (%)    Lymphs Abs 2.2  0.7 - 4.0 (K/uL)    Monocytes Relative 7  3 - 12 (%)    Monocytes Absolute 0.5  0.1 - 1.0 (K/uL)    Eosinophils Relative 3  0 - 5 (%)    Eosinophils Absolute 0.2  0.0 - 0.7 (K/uL)    Basophils Relative 1  0 - 1 (%)    Basophils Absolute 0.0  0.0 - 0.1 (K/uL)   COMPREHENSIVE METABOLIC PANEL     Status: Abnormal   Collection Time   08/14/11  11:25 AM      Component Value Range Comment   Sodium 138  135 - 145 (mEq/L)    Potassium 4.3  3.5 - 5.1 (mEq/L)    Chloride 102  96 - 112 (mEq/L)    CO2 30  19 - 32 (mEq/L)    Glucose, Bld 115 (*) 70 - 99 (mg/dL)    BUN 11  6 - 23 (mg/dL)    Creatinine, Ser 5.62  0.50 - 1.10 (mg/dL)    Calcium 9.6  8.4 - 10.5 (mg/dL)    Total Protein 7.1  6.0 - 8.3 (g/dL)    Albumin 3.5  3.5 - 5.2 (g/dL)    AST 21  0 - 37 (U/L)    ALT 36 (*) 0 - 35 (U/L)    Alkaline Phosphatase 107  39 - 117 (U/L)    Total Bilirubin 0.6  0.3 - 1.2 (mg/dL)    GFR calc non Af Amer 80 (*) >90 (mL/min)    GFR calc Af Amer >90  >90 (mL/min)   LIPASE, BLOOD     Status: Normal   Collection Time   08/14/11 11:25 AM      Component Value Range Comment   Lipase 46  11 - 59 (U/L)   URINALYSIS, ROUTINE W REFLEX MICROSCOPIC     Status: Normal   Collection Time   08/14/11 11:26 AM      Component Value Range Comment   Color, Urine YELLOW  YELLOW     Appearance CLEAR  CLEAR     Specific Gravity, Urine 1.015  1.005 - 1.030     pH 6.5  5.0 - 8.0     Glucose, UA NEGATIVE  NEGATIVE (mg/dL)    Hgb urine dipstick NEGATIVE  NEGATIVE     Bilirubin Urine NEGATIVE  NEGATIVE     Ketones, ur NEGATIVE  NEGATIVE (mg/dL)    Protein, ur NEGATIVE  NEGATIVE (mg/dL)    Urobilinogen, UA 0.2  0.0 - 1.0 (mg/dL)    Nitrite NEGATIVE  NEGATIVE     Leukocytes, UA NEGATIVE  NEGATIVE  MICROSCOPIC NOT DONE ON URINES WITH NEGATIVE PROTEIN, BLOOD, LEUKOCYTES, NITRITE, OR GLUCOSE <1000 mg/dL.   No results found for this or any previous visit (from the past 240 hour(s)).  Ct Abdomen Pelvis W Contrast  08/14/2011  *RADIOLOGY REPORT*  Clinical Data: Abdominal pain.  CT ABDOMEN AND PELVIS WITH CONTRAST  Technique:  Multidetector CT imaging of the abdomen and pelvis was performed following the standard protocol during bolus administration of intravenous contrast.  Contrast: OMNIPAQUE IOHEXOL 300 MG/ML IV SOLN  Comparison: 04/07/2007  Findings: Bibasilar  atelectasis.  Heart is normal size.  No effusions.  Prior cholecystectomy.  There is pneumobilia.  In addition, extraluminal gas is seen in the right retroperitoneum adjacent to the right adrenal gland and liver edge in the right anterior pararenal space.  The patient reportedly had recent endoscopy. Findings are compatible with probable duodenal perforation. Stomach and duodenum appear normal.  Liver, spleen, pancreas, adrenals and kidneys have a normal appearance.  Large and small bowel grossly unremarkable.  No free fluid, free air or adenopathy.  Uterus, adnexa urinary bladder grossly unremarkable.  No acute bony abnormality.  IMPRESSION: Right retroperitoneal air and pneumobilia, both presumably related to recent endoscopy.  Findings most compatible with duodenal perforation.  Bibasilar atelectasis.  These results were discussed with Dr. Estell Harpin at the time of interpretation.  Original Report Authenticated By: Cyndie Chime, M.D.   Dg Ercp With Sphincterotomy  08/12/2011  **ADDENDUM** CREATED: 08/12/2011 15:31:18  First sentence of the impression should be corrected to state:  "Negative intrahepatic and EXTRAHEPATIC biliary tree."  **END ADDENDUM** SIGNED BY: Loraine Leriche A. Tyron Russell, M.D.   08/12/2011  *RADIOLOGY REPORT*  Clinical Data: 55 year old female with recent history of abdominal pain.  ERCP  Comparison:  Abdominal ultrasound 06/28/2011.  CT abdomen 04/07/2007.  Technique:  Multiple spot images obtained with the fluoroscopic device and submitted for interpretation post-procedure.  Fluoroscopy time of 2.1 minutes was utilized.  .  Findings: Multiple intraoperative fluoroscopic images of the right upper quadrant.  Endoscope is in place on the initial view with surgical clips at the level of the cystic duct.  Wire access to the CBD is obtained and contrast is injected.  No intra or extrahepatic biliary ductal dilatation.  Balloon sweeping of the CBD is demonstrated.  No filling defect or extravasation identified.   IMPRESSION: Negative intrahepatic and extra panic biliary tree.  These images were submitted for radiologic interpretation only. Please see the procedural report for the amount of contrast and the fluoroscopy time utilized. Original Report Authenticated By: Lollie Marrow, M.D.   Dg Abd Acute W/chest  08/14/2011  *RADIOLOGY REPORT*  Clinical Data: Abdominal pain.  Recent ERCP.  Next field none  ACUTE ABDOMEN SERIES (ABDOMEN 2 VIEW & CHEST 1 VIEW)  Comparison: None  Findings: Bowel gas pattern does not show evidence of ileus, obstruction or free air.  There is a moderate amount of fecal matter in the right colon.  No worrisome calcifications or bony findings.  One-view chest shows normal heart and mediastinal shadows.  There are chronic interstitial lung markings at the bases.  No free air.  IMPRESSION: No acute finding by plain radiography.  Moderate amount of fecal matter in the right colon.  Original Report Authenticated By: Thomasenia Sales, M.D.   Impression: 1. Microperforation of the duodenum status post ERCP. 2. Gastroesophageal reflux disease. 3. Remote history of pulmonary embolism.     Plan: 1. Admit to general medical floor.NPO.iv fluids. 2. Intravenous Protonix. 3. Intravenous Zosyn. 4. Monitor closely. 5. GI consult. Dr Karilyn Cota is aware of this patient's admission and Dr Darrick Penna is covering.      Omunique Pederson C 08/14/2011, 4:15 PM

## 2011-08-14 NOTE — ED Notes (Signed)
Patient with c/o LUQ abdominal pain. Patient had EGD on Friday and states they made an incision in her bile duct. Patient reports spasms yesterday in her abdomen and aching pain in her LUQ today. Denies N/V. Denies fever.

## 2011-08-15 ENCOUNTER — Encounter (HOSPITAL_COMMUNITY): Payer: Self-pay | Admitting: Urgent Care

## 2011-08-15 DIAGNOSIS — R109 Unspecified abdominal pain: Secondary | ICD-10-CM

## 2011-08-15 DIAGNOSIS — K631 Perforation of intestine (nontraumatic): Secondary | ICD-10-CM

## 2011-08-15 LAB — COMPREHENSIVE METABOLIC PANEL
Albumin: 3 g/dL — ABNORMAL LOW (ref 3.5–5.2)
Alkaline Phosphatase: 88 U/L (ref 39–117)
BUN: 8 mg/dL (ref 6–23)
Potassium: 4.1 mEq/L (ref 3.5–5.1)
Total Protein: 6.2 g/dL (ref 6.0–8.3)

## 2011-08-15 LAB — CBC
HCT: 42.9 % (ref 36.0–46.0)
MCV: 84.4 fL (ref 78.0–100.0)
RBC: 5.08 MIL/uL (ref 3.87–5.11)
RDW: 13.6 % (ref 11.5–15.5)
WBC: 5.9 10*3/uL (ref 4.0–10.5)

## 2011-08-15 MED ORDER — PANTOPRAZOLE SODIUM 40 MG IV SOLR: 40.0000 mg | Freq: Two times a day (BID) | INTRAVENOUS | Status: DC

## 2011-08-15 MED ORDER — PIPERACILLIN-TAZOBACTAM 3.375 G IVPB 30 MIN
3.3750 g | Freq: Three times a day (TID) | INTRAVENOUS | Status: DC
  Administered 2011-08-15 – 2011-08-17 (×8): 3.375 g via INTRAVENOUS
  Filled 2011-08-15 (×11): qty 50

## 2011-08-15 MED ORDER — RABEPRAZOLE SODIUM 20 MG PO TBEC: 20.0000 mg | DELAYED_RELEASE_TABLET | Freq: Every day | ORAL | Status: DC

## 2011-08-15 MED ORDER — PIPERACILLIN-TAZOBACTAM 3.375 G IVPB
INTRAVENOUS | Status: AC
  Filled 2011-08-15: qty 50

## 2011-08-15 MED ORDER — SODIUM CHLORIDE 0.9 % IJ SOLN
INTRAMUSCULAR | Status: AC
  Administered 2011-08-15: 10 mL
  Filled 2011-08-15: qty 3

## 2011-08-15 NOTE — Consult Note (Signed)
REVIEWED. AGREE. 

## 2011-08-15 NOTE — Progress Notes (Signed)
Subjective: This lady was admitted yesterday with a microperforation of the duodenum status post ERCP.She does have some abdominal pain which is relieved with intravenous morphine otherwise remains without any changes.           Physical Exam: Blood pressure 101/64, pulse 57, temperature 98 F (36.7 C), temperature source Oral, resp. rate 16, height 5\' 3"  (1.6 m), weight 82.555 kg (182 lb), last menstrual period 04/09/2011, SpO2 94.00%. She looks systemically well. She is not toxic or septic and she is not clinically shock. Her abdomen is soft and mildly tender as before. Bowel sounds are heard. Lung fields are clear.   Investigations: Results for orders placed during the hospital encounter of 08/14/11 (from the past 48 hour(s))  CBC     Status: Abnormal   Collection Time   08/14/11 11:25 AM      Component Value Range Comment   WBC 8.3  4.0 - 10.5 (K/uL)    RBC 5.51 (*) 3.87 - 5.11 (MIL/uL)    Hemoglobin 15.6 (*) 12.0 - 15.0 (g/dL)    HCT 11.9 (*) 14.7 - 46.0 (%)    MCV 84.6  78.0 - 100.0 (fL)    MCH 28.3  26.0 - 34.0 (pg)    MCHC 33.5  30.0 - 36.0 (g/dL)    RDW 82.9  56.2 - 13.0 (%)    Platelets 234  150 - 400 (K/uL)   DIFFERENTIAL     Status: Normal   Collection Time   08/14/11 11:25 AM      Component Value Range Comment   Neutrophils Relative 63  43 - 77 (%)    Neutro Abs 5.2  1.7 - 7.7 (K/uL)    Lymphocytes Relative 27  12 - 46 (%)    Lymphs Abs 2.2  0.7 - 4.0 (K/uL)    Monocytes Relative 7  3 - 12 (%)    Monocytes Absolute 0.5  0.1 - 1.0 (K/uL)    Eosinophils Relative 3  0 - 5 (%)    Eosinophils Absolute 0.2  0.0 - 0.7 (K/uL)    Basophils Relative 1  0 - 1 (%)    Basophils Absolute 0.0  0.0 - 0.1 (K/uL)   COMPREHENSIVE METABOLIC PANEL     Status: Abnormal   Collection Time   08/14/11 11:25 AM      Component Value Range Comment   Sodium 138  135 - 145 (mEq/L)    Potassium 4.3  3.5 - 5.1 (mEq/L)    Chloride 102  96 - 112 (mEq/L)    CO2 30  19 - 32 (mEq/L)    Glucose, Bld 115 (*) 70 - 99 (mg/dL)    BUN 11  6 - 23 (mg/dL)    Creatinine, Ser 8.65  0.50 - 1.10 (mg/dL)    Calcium 9.6  8.4 - 10.5 (mg/dL)    Total Protein 7.1  6.0 - 8.3 (g/dL)    Albumin 3.5  3.5 - 5.2 (g/dL)    AST 21  0 - 37 (U/L)    ALT 36 (*) 0 - 35 (U/L)    Alkaline Phosphatase 107  39 - 117 (U/L)    Total Bilirubin 0.6  0.3 - 1.2 (mg/dL)    GFR calc non Af Amer 80 (*) >90 (mL/min)    GFR calc Af Amer >90  >90 (mL/min)   LIPASE, BLOOD     Status: Normal   Collection Time   08/14/11 11:25 AM      Component Value Range  Comment   Lipase 46  11 - 59 (U/L)   URINALYSIS, ROUTINE W REFLEX MICROSCOPIC     Status: Normal   Collection Time   08/14/11 11:26 AM      Component Value Range Comment   Color, Urine YELLOW  YELLOW     Appearance CLEAR  CLEAR     Specific Gravity, Urine 1.015  1.005 - 1.030     pH 6.5  5.0 - 8.0     Glucose, UA NEGATIVE  NEGATIVE (mg/dL)    Hgb urine dipstick NEGATIVE  NEGATIVE     Bilirubin Urine NEGATIVE  NEGATIVE     Ketones, ur NEGATIVE  NEGATIVE (mg/dL)    Protein, ur NEGATIVE  NEGATIVE (mg/dL)    Urobilinogen, UA 0.2  0.0 - 1.0 (mg/dL)    Nitrite NEGATIVE  NEGATIVE     Leukocytes, UA NEGATIVE  NEGATIVE  MICROSCOPIC NOT DONE ON URINES WITH NEGATIVE PROTEIN, BLOOD, LEUKOCYTES, NITRITE, OR GLUCOSE <1000 mg/dL.  COMPREHENSIVE METABOLIC PANEL     Status: Abnormal   Collection Time   08/15/11  5:05 AM      Component Value Range Comment   Sodium 140  135 - 145 (mEq/L)    Potassium 4.1  3.5 - 5.1 (mEq/L)    Chloride 105  96 - 112 (mEq/L)    CO2 28  19 - 32 (mEq/L)    Glucose, Bld 112 (*) 70 - 99 (mg/dL)    BUN 8  6 - 23 (mg/dL)    Creatinine, Ser 1.61  0.50 - 1.10 (mg/dL)    Calcium 8.9  8.4 - 10.5 (mg/dL)    Total Protein 6.2  6.0 - 8.3 (g/dL)    Albumin 3.0 (*) 3.5 - 5.2 (g/dL)    AST 17  0 - 37 (U/L)    ALT 28  0 - 35 (U/L)    Alkaline Phosphatase 88  39 - 117 (U/L)    Total Bilirubin 0.8  0.3 - 1.2 (mg/dL)    GFR calc non Af Amer 72 (*) >90  (mL/min)    GFR calc Af Amer 83 (*) >90 (mL/min)   CBC     Status: Normal   Collection Time   08/15/11  5:05 AM      Component Value Range Comment   WBC 5.9  4.0 - 10.5 (K/uL)    RBC 5.08  3.87 - 5.11 (MIL/uL)    Hemoglobin 14.5  12.0 - 15.0 (g/dL)    HCT 09.6  04.5 - 40.9 (%)    MCV 84.4  78.0 - 100.0 (fL)    MCH 28.5  26.0 - 34.0 (pg)    MCHC 33.8  30.0 - 36.0 (g/dL)    RDW 81.1  91.4 - 78.2 (%)    Platelets 206  150 - 400 (K/uL)      Ct Abdomen Pelvis W Contrast  08/14/2011  *RADIOLOGY REPORT*  Clinical Data: Abdominal pain.  CT ABDOMEN AND PELVIS WITH CONTRAST  Technique:  Multidetector CT imaging of the abdomen and pelvis was performed following the standard protocol during bolus administration of intravenous contrast.  Contrast: OMNIPAQUE IOHEXOL 300 MG/ML IV SOLN  Comparison: 04/07/2007  Findings: Bibasilar atelectasis.  Heart is normal size.  No effusions.  Prior cholecystectomy.  There is pneumobilia.  In addition, extraluminal gas is seen in the right retroperitoneum adjacent to the right adrenal gland and liver edge in the right anterior pararenal space.  The patient reportedly had recent endoscopy. Findings are compatible with  probable duodenal perforation. Stomach and duodenum appear normal.  Liver, spleen, pancreas, adrenals and kidneys have a normal appearance.  Large and small bowel grossly unremarkable.  No free fluid, free air or adenopathy.  Uterus, adnexa urinary bladder grossly unremarkable.  No acute bony abnormality.  IMPRESSION: Right retroperitoneal air and pneumobilia, both presumably related to recent endoscopy.  Findings most compatible with duodenal perforation.  Bibasilar atelectasis.  These results were discussed with Dr. Estell Harpin at the time of interpretation.  Original Report Authenticated By: Cyndie Chime, M.D.   Dg Abd Acute W/chest  08/14/2011  *RADIOLOGY REPORT*  Clinical Data: Abdominal pain.  Recent ERCP.  Next field none  ACUTE ABDOMEN SERIES (ABDOMEN  2 VIEW & CHEST 1 VIEW)  Comparison: None  Findings: Bowel gas pattern does not show evidence of ileus, obstruction or free air.  There is a moderate amount of fecal matter in the right colon.  No worrisome calcifications or bony findings.  One-view chest shows normal heart and mediastinal shadows.  There are chronic interstitial lung markings at the bases.  No free air.  IMPRESSION: No acute finding by plain radiography.  Moderate amount of fecal matter in the right colon.  Original Report Authenticated By: Thomasenia Sales, M.D.      Medications: I have reviewed the patient's current medications.  Impression: 1. Duodenal perforation. 2. Gastroesophageal reflux disease.     Plan: 1. Continue with intravenous antibiotics and intravenous Protonix. 2. Await gastroenterology opinion.     LOS: 1 day   Shiniqua Groseclose C 08/15/2011, 8:16 AM

## 2011-08-15 NOTE — Consult Note (Signed)
ANTIBIOTIC CONSULT NOTE - INITIAL  Pharmacy Consult for Zosyn Indication: empiric ABX coverage  Allergies  Allergen Reactions  . Phenergan Hives  . Hydromorphone Itching   Patient Measurements: Height: 5\' 3"  (160 cm) Weight: 182 lb (82.555 kg) IBW/kg (Calculated) : 52.4   Vital Signs: Temp: 98 F (36.7 C) (11/05 0534) Temp src: Oral (11/05 0534) BP: 101/64 mmHg (11/05 0534) Pulse Rate: 57  (11/05 0534) Intake/Output from previous day: 11/04 0701 - 11/05 0700 In: 0  Out: 300 [Urine:300] Intake/Output from this shift:    Labs:  Basename 08/15/11 0505 08/14/11 1125  WBC 5.9 8.3  HGB 14.5 15.6*  PLT 206 234  LABCREA -- --  CREATININE 0.90 0.82   Estimated Creatinine Clearance: 73.6 ml/min (by C-G formula based on Cr of 0.9). No results found for this basename: VANCOTROUGH:2,VANCOPEAK:2,VANCORANDOM:2,GENTTROUGH:2,GENTPEAK:2,GENTRANDOM:2,TOBRATROUGH:2,TOBRAPEAK:2,TOBRARND:2,AMIKACINPEAK:2,AMIKACINTROU:2,AMIKACIN:2, in the last 72 hours   Microbiology: No results found for this or any previous visit (from the past 720 hour(s)).  Medical History: Past Medical History  Diagnosis Date  . Pulmonary embolism     5 yrs ago (unknown region)  . GERD (gastroesophageal reflux disease)   . Pulmonary embolus 5 yrs ago   Medications:  Scheduled:    . fentaNYL  75 mcg Intravenous Once  . fentaNYL  75 mcg Intravenous Once  . influenza  inactive virus vaccine  0.5 mL Intramuscular Once  . morphine      . ondansetron  4 mg Intravenous Once  . ondansetron  4 mg Intravenous Once  . pantoprazole (PROTONIX) IV  40 mg Intravenous Q12H  . piperacillin-tazobactam  3.375 g Intravenous Once  . piperacillin-tazobactam  3.375 g Intravenous Q8H  . DISCONTD: pantoprazole (PROTONIX) IV  40 mg Intravenous Q12H   Assessment: Good renal fxn  Goal of Therapy:  Eradicate infection.  Plan: Zosyn 3.375gm iv q8hrs Labs per protocol F/U micro data as appropriate.  Margo Aye, Jolita Haefner  A 08/15/2011,7:38 AM

## 2011-08-15 NOTE — Consult Note (Signed)
Reason for Consult: Duodenal perforation, status post ERCP with sphincterotomy Referring Physician: [facility]  Mallory [name] is an 55 y.o. female.  HPI: Patient is a 56 year old white female status post an ERCP with sphincterotomy by Dr. Karilyn Cota on 08/12/2011 who presented emergency room 2 days later with worsening epigastric pain. CT scan of the abdomen and pelvis reveals pneumobilia as well as air around the retroperitoneal portion of the duodenum, as consistent with duodenal perforation. No abscess cavity is seen. No free air is noted in the abdominal cavity. Patient states her pain is somewhat controlled with morphine.  Past Medical History  Diagnosis Date  . Pulmonary embolism     5 yrs ago (unknown region)  . GERD (gastroesophageal reflux disease)     Past Surgical History  Procedure Date  . Cesarean section 1991    [facility]  . Cholecystectomy 15 yrs ago    [facility]  . Ercp w/ sphicterotomy 08/12/11    Dr Rehman-?microlithiasis, SOD    Family History  Problem Relation Age of Onset  . Diabetes Mother   . Heart failure Father   . Diabetes Brother   . Anesthesia problems Neg Hx   . Hypotension Neg Hx   . Malignant hyperthermia Neg Hx   . Pseudochol deficiency Neg Hx     Social History:  reports that she has never smoked. She does not have any smokeless tobacco history on file. She reports that she does not drink alcohol or use illicit drugs.  Allergies:  Allergies  Allergen Reactions  . Phenergan Hives  . Hydromorphone Itching    Medications: I have reviewed the patient's current medications.  Results for orders placed during the hospital encounter of 08/14/11 (from the past 48 hour(s))  CBC     Status: Abnormal   Collection Time   08/14/11 11:25 AM      Component Value Range Comment   WBC 8.3  4.0 - 10.5 (K/uL)    RBC 5.51 (*) 3.87 - 5.11 (MIL/uL)    Hemoglobin 15.6 (*) 12.0 - 15.0 (g/dL)    HCT 96.0 (*) 45.4 - 46.0 (%)    MCV 84.6  78.0 - 100.0 (fL)    MCH 28.3   26.0 - 34.0 (pg)    MCHC 33.5  30.0 - 36.0 (g/dL)    RDW 09.8  11.9 - 14.7 (%)    Platelets 234  150 - 400 (K/uL)   DIFFERENTIAL     Status: Normal   Collection Time   08/14/11 11:25 AM      Component Value Range Comment   Neutrophils Relative 63  43 - 77 (%)    Neutro Abs 5.2  1.7 - 7.7 (K/uL)    Lymphocytes Relative 27  12 - 46 (%)    Lymphs Abs 2.2  0.7 - 4.0 (K/uL)    Monocytes Relative 7  3 - 12 (%)    Monocytes Absolute 0.5  0.1 - 1.0 (K/uL)    Eosinophils Relative 3  0 - 5 (%)    Eosinophils Absolute 0.2  0.0 - 0.7 (K/uL)    Basophils Relative 1  0 - 1 (%)    Basophils Absolute 0.0  0.0 - 0.1 (K/uL)   COMPREHENSIVE METABOLIC PANEL     Status: Abnormal   Collection Time   08/14/11 11:25 AM      Component Value Range Comment   Sodium 138  135 - 145 (mEq/L)    Potassium 4.3  3.5 - 5.1 (mEq/L)  Chloride 102  96 - 112 (mEq/L)    CO2 30  19 - 32 (mEq/L)    Glucose, Bld 115 (*) 70 - 99 (mg/dL)    BUN 11  6 - 23 (mg/dL)    Creatinine, Ser 1.61  0.50 - 1.10 (mg/dL)    Calcium 9.6  8.4 - 10.5 (mg/dL)    Total Protein 7.1  6.0 - 8.3 (g/dL)    Albumin 3.5  3.5 - 5.2 (g/dL)    AST 21  0 - 37 (U/L)    ALT 36 (*) 0 - 35 (U/L)    Alkaline Phosphatase 107  39 - 117 (U/L)    Total Bilirubin 0.6  0.3 - 1.2 (mg/dL)    GFR calc non Af Amer 80 (*) >90 (mL/min)    GFR calc Af Amer >90  >90 (mL/min)   LIPASE, BLOOD     Status: Normal   Collection Time   08/14/11 11:25 AM      Component Value Range Comment   Lipase 46  11 - 59 (U/L)   URINALYSIS, ROUTINE W REFLEX MICROSCOPIC     Status: Normal   Collection Time   08/14/11 11:26 AM      Component Value Range Comment   Color, Urine YELLOW  YELLOW     Appearance CLEAR  CLEAR     Specific Gravity, Urine 1.015  1.005 - 1.030     pH 6.5  5.0 - 8.0     Glucose, UA NEGATIVE  NEGATIVE (mg/dL)    Hgb urine dipstick NEGATIVE  NEGATIVE     Bilirubin Urine NEGATIVE  NEGATIVE     Ketones, ur NEGATIVE  NEGATIVE (mg/dL)    Protein, ur NEGATIVE   NEGATIVE (mg/dL)    Urobilinogen, UA 0.2  0.0 - 1.0 (mg/dL)    Nitrite NEGATIVE  NEGATIVE     Leukocytes, UA NEGATIVE  NEGATIVE  MICROSCOPIC NOT DONE ON URINES WITH NEGATIVE PROTEIN, BLOOD, LEUKOCYTES, NITRITE, OR GLUCOSE <1000 mg/dL.  COMPREHENSIVE METABOLIC PANEL     Status: Abnormal   Collection Time   08/15/11  5:05 AM      Component Value Range Comment   Sodium 140  135 - 145 (mEq/L)    Potassium 4.1  3.5 - 5.1 (mEq/L)    Chloride 105  96 - 112 (mEq/L)    CO2 28  19 - 32 (mEq/L)    Glucose, Bld 112 (*) 70 - 99 (mg/dL)    BUN 8  6 - 23 (mg/dL)    Creatinine, Ser 0.96  0.50 - 1.10 (mg/dL)    Calcium 8.9  8.4 - 10.5 (mg/dL)    Total Protein 6.2  6.0 - 8.3 (g/dL)    Albumin 3.0 (*) 3.5 - 5.2 (g/dL)    AST 17  0 - 37 (U/L)    ALT 28  0 - 35 (U/L)    Alkaline Phosphatase 88  39 - 117 (U/L)    Total Bilirubin 0.8  0.3 - 1.2 (mg/dL)    GFR calc non Af Amer 72 (*) >90 (mL/min)    GFR calc Af Amer 83 (*) >90 (mL/min)   CBC     Status: Normal   Collection Time   08/15/11  5:05 AM      Component Value Range Comment   WBC 5.9  4.0 - 10.5 (K/uL)    RBC 5.08  3.87 - 5.11 (MIL/uL)    Hemoglobin 14.5  12.0 - 15.0 (g/dL)    HCT 42.9  36.0 - 46.0 (%)    MCV 84.4  78.0 - 100.0 (fL)    MCH 28.5  26.0 - 34.0 (pg)    MCHC 33.8  30.0 - 36.0 (g/dL)    RDW 16.1  09.6 - 04.5 (%)    Platelets 206  150 - 400 (K/uL)    lipase within normal limits.  Ct Abdomen Pelvis W Contrast  08/14/2011  *RADIOLOGY REPORT*  Clinical Data: Abdominal pain.  CT ABDOMEN AND PELVIS WITH CONTRAST  Technique:  Multidetector CT imaging of the abdomen and pelvis was performed following the standard protocol during bolus administration of intravenous contrast.  Contrast: OMNIPAQUE IOHEXOL 300 MG/ML IV SOLN  Comparison: 04/07/2007  Findings: Bibasilar atelectasis.  Heart is normal size.  No effusions.  Prior cholecystectomy.  There is pneumobilia.  In addition, extraluminal gas is seen in the right retroperitoneum adjacent  to the right adrenal gland and liver edge in the right anterior pararenal space.  The patient reportedly had recent endoscopy. Findings are compatible with probable duodenal perforation. Stomach and duodenum appear normal.  Liver, spleen, pancreas, adrenals and kidneys have a normal appearance.  Large and small bowel grossly unremarkable.  No free fluid, free air or adenopathy.  Uterus, adnexa urinary bladder grossly unremarkable.  No acute bony abnormality.  IMPRESSION: Right retroperitoneal air and pneumobilia, both presumably related to recent endoscopy.  Findings most compatible with duodenal perforation.  Bibasilar atelectasis.  These results were discussed with Dr. Estell Harpin at the time of interpretation.  Original Report Authenticated By: Cyndie Chime, M.D.   Dg Abd Acute W/chest  08/14/2011  *RADIOLOGY REPORT*  Clinical Data: Abdominal pain.  Recent ERCP.  Next field none  ACUTE ABDOMEN SERIES (ABDOMEN 2 VIEW & CHEST 1 VIEW)  Comparison: None  Findings: Bowel gas pattern does not show evidence of ileus, obstruction or free air.  There is a moderate amount of fecal matter in the right colon.  No worrisome calcifications or bony findings.  One-view chest shows normal heart and mediastinal shadows.  There are chronic interstitial lung markings at the bases.  No free air.  IMPRESSION: No acute finding by plain radiography.  Moderate amount of fecal matter in the right colon.  Original Report Authenticated By: Thomasenia Sales, M.D.    ROS: Unremarkable Blood pressure 101/64, pulse 57, temperature 98 F (36.7 C), temperature source Oral, resp. rate 16, height 5\' 3"  (1.6 m), weight 82.555 kg (182 lb), last menstrual period 04/09/2011, SpO2 94.00%. Physical Exam: Well-developed, well-nourished white female. Appears to Comfortable in the bed. Lungs: Clear to auscultation with equal breath sounds bilaterally. Heart: Regular rate and rhythm without S3, S4, murmurs. Abdomen: Soft, with nonspecific tenderness,  though no rigidity noted. No hepatosplenomegaly or masses are noted.  Assessment/Plan: Impression: Contained duodenal perforation, status post ERCP with sphincterotomy. No apparent contrast extravasation noted. Plan: No need for acute surgical intervention at this time. This air may been introduced at the time of the sphincterotomy, plus this may resolve with bowel rest. The course of treatment has been explained to the patient, who agrees to the treatment plan. I'll follow patient closely with you.  Muhamed Luecke A 08/15/2011, 9:51 AM

## 2011-08-15 NOTE — ED Provider Notes (Signed)
Medical screening examination/treatment/procedure(s) were performed by non-physician practitioner and as supervising physician I was immediately available for consultation/collaboration.   Darris Carachure L Tangy Drozdowski, MD 08/15/11 0946 

## 2011-08-15 NOTE — Consult Note (Signed)
Referring Provider: Dr. Karilyn Cota Primary Care Physician:  Cassell Smiles., MD Primary Gastroenterologist:  Dr. Karilyn Cota  Reason for Consultation:  Duodenal perforation s/p ERCP  HPI: Mallory [name] is a 55 y.o. female pt of Dr Patty Sermons admitted w/ duodenal perforation s/p ERCP & sphincterotomy 08/12/11.  She had transaminitis w/ abd pain & suspected microlithiasis vs. Sphincter of Oddi dysfunction.  1 day post procedure began to have upper abdominal contractions.  Yesterday AM began to have severe pain in RUQ & radiates across top of abdomen. Pain 8/10 & intermittent.  C/o mild nausea, no vomiting.  Last BM 4 days ago.  Has had very little food since ERCP, a bit of soup.  C/o anorexia.  Now NPO.  Denies fever or chills.  Denies heartburn & indigestion.  Denies jaundice or pruritis.  CT findings as below.    Past Medical History  Diagnosis Date  . Pulmonary embolism     5 yrs ago (unknown region)  . GERD (gastroesophageal reflux disease)    Past Surgical History  Procedure Date  . Cesarean section 1991    [facility]  . Cholecystectomy 15 yrs ago    [facility]  . Ercp w/ sphicterotomy 08/12/11    Dr Rehman-?microlithiasis, SOD   Prior to Admission medications   Medication Sig Start Date End Date Taking? Authorizing Provider  pantoprazole (PROTONIX) 40 MG tablet Take 1 tablet (40 mg total) by mouth daily. 08/12/11 08/11/12 Yes Malissa Hippo, MD   Current Facility-Administered Medications  Medication Dose Route Frequency Provider Last Rate Last Dose  . dextrose 5 % and 0.9 % NaCl with KCl 40 mEq/L infusion   Intravenous Continuous Nimish C Gosrani 75 mL/hr at 08/14/11 1806    . fentaNYL (SUBLIMAZE) injection 75 mcg  75 mcg Intravenous Once [employer], PA      . fentaNYL (SUBLIMAZE) injection 75 mcg  75 mcg Intravenous Once [employer], PA      . influenza  inactive virus vaccine (FLUZONE/FLUARIX) injection 0.5 mL  0.5 mL Intramuscular Once Nimish C Gosrani      . iohexol (OMNIPAQUE) 300 MG/ML injection  100 mL  100 mL Intravenous Once PRN Medication Radiologist   100 mL at 08/14/11 1513  . morphine 2 MG/ML injection 2 mg  2 mg Intravenous Q2H PRN Nimish C Gosrani   2 mg at 08/15/11 0320  . morphine 2 MG/ML injection        2 mg at 08/14/11 1127  . morphine 4 MG/ML injection 2 mg  2 mg Intravenous Q2H PRN Candis Musa, PA      . ondansetron (ZOFRAN) injection 4 mg  4 mg Intravenous Once Candis Musa, PA   4 mg at 08/14/11 1127  . ondansetron (ZOFRAN) injection 4 mg  4 mg Intravenous Once Candis Musa, PA   4 mg at 08/14/11 1518  . ondansetron (ZOFRAN) injection 4 mg  4 mg Intravenous Q6H PRN Nimish C Gosrani      . pantoprazole (PROTONIX) injection 40 mg  40 mg Intravenous Q12H Malissa Hippo, MD   40 mg at 08/14/11 2248  . piperacillin-tazobactam (ZOSYN) IVPB 3.375 g  3.375 g Intravenous Once Jola Baptist Idol, PA   3.375 g at 08/14/11 1545  . piperacillin-tazobactam (ZOSYN) IVPB 3.375 g  3.375 g Intravenous Q8H Nimish C Gosrani   3.375 g at 08/15/11 0344  . DISCONTD: pantoprazole (PROTONIX) injection 40 mg  40 mg Intravenous Q12H Nimish C Gosrani      .  DISCONTD: promethazine (PHENERGAN) injection 12.5 mg  12.5 mg Intravenous Q6H PRN Nimish C Gosrani      . DISCONTD: promethazine (PHENERGAN) tablet 12.5 mg  12.5 mg Oral Q6H PRN Nimish C Gosrani       Allergies as of 08/14/2011 - Review Complete 08/14/2011  Allergen Reaction Noted  . Phenergan Hives 08/14/2011  . Hydromorphone Itching 08/08/2011   Family History:There is no known family history of colorectal carcinoma , liver disease, or inflammatory bowel disease.  Problem Relation Age of Onset  . Diabetes Mother   . Heart failure Father   . Diabetes Brother   . Anesthesia problems Neg Hx   . Hypotension Neg Hx   . Malignant hyperthermia Neg Hx   . Pseudochol deficiency Neg Hx    History   Social History  . Marital Status: Divorced    Spouse Name: N/A    Number of Children: 2  . Years of Education: N/A   Occupational History  .  [employer]    Social History Main Topics  . Smoking status: Never Smoker   . Smokeless tobacco: Not on file  . Alcohol Use: No  . Drug Use: No  . Sexually Active: Yes    Birth Control/ Protection: Post-menopausal   Other Topics Concern  . Not on file   Social History Narrative   Lives w/ 47 yr old daughter.  2 daughters  Review of Systems: Gen: + fatigue.  Denies any fever, chills, sweats, weakness, malaise, weight loss, and sleep disorder CV: Denies chest pain, angina, palpitations, syncope, orthopnea, PND, peripheral edema, and claudication. Resp: Denies dyspnea at rest, dyspnea with exercise, cough, sputum, wheezing, coughing up blood, and pleurisy. GI: Denies vomiting blood, jaundice, and fecal incontinence.   Denies dysphagia or odynophagia. GU : Denies urinary burning, blood in urine, urinary frequency, urinary hesitancy, nocturnal urination, and urinary incontinence. MS: Denies joint pain, limitation of movement, and swelling, stiffness, low back pain, extremity pain. Denies muscle weakness, cramps, atrophy.  Derm: Denies rash, itching, dry skin, hives, moles, warts, or unhealing ulcers.  Psych: Denies depression, anxiety, memory loss, suicidal ideation, hallucinations, paranoia, and confusion. Heme: Denies bruising, bleeding, and enlarged lymph nodes. Neuro:  Denies any headaches, dizziness, paresthesias. Endo:  Denies any problems with DM, thyroid, adrenal function.  Physical Exam: Vital signs in last 24 hours: Temp:  [97.5 F (36.4 C)-98 F (36.7 C)] 98 F (36.7 C) (11/05 0534) Pulse Rate:  [54-63] 57  (11/05 0534) Resp:  [16-18] 16  (11/05 0534) BP: (101-126)/(64-75) 101/64 mmHg (11/05 0534) SpO2:  [94 %-99 %] 94 % (11/05 0534) Weight:  [182 lb (82.555 kg)] 182 lb (82.555 kg) (11/04 1022) Last BM Date: 08/11/11 General:   Alert,  Well-developed, well-nourished, pleasant and cooperative in NAD Head:  Normocephalic and atraumatic. Eyes:  Sclera clear, no icterus.    Conjunctiva pink. Ears:  Normal auditory acuity. Nose:  No deformity, discharge,  or lesions. Mouth:  No deformity or lesions.  Oropharynx pink & moist. Neck:  Supple; no masses or thyromegaly. Lungs:  Clear throughout to auscultation.   No wheezes, crackles, or rhonchi. No acute distress. Heart:  Regular rate and rhythm; no murmurs, clicks, rubs,  or gallops. Abdomen:  Soft and nondistended. +TTP at umbilicus & RUQ.  No masses, hepatosplenomegaly or hernias noted. Normal bowel sounds, without guarding, and without rebound.   Rectal:  Deferred. Msk:  Symmetrical without gross deformities. Normal posture. Pulses:  Normal pulses noted. Extremities:  Without clubbing or edema. Neurologic:  Alert and  oriented x4;  grossly normal neurologically. Skin:  Intact without significant lesions or rashes. Cervical Nodes:  No significant cervical adenopathy. Psych:  Alert and cooperative. Normal mood and affect.  Intake/Output from previous day: 11/04 0701 - 11/05 0700 In: 0  Out: 300 [Urine:300]  Lab Results:  [facility] 08/15/11 0505 08/14/11 1125  WBC 5.9 8.3  HGB 14.5 15.6*  HCT 42.9 46.6*  PLT 206 234   BMET  Basename 08/15/11 0505 08/14/11 1125  NA 140 138  K 4.1 4.3  CL 105 102  CO2 28 30  GLUCOSE 112* 115*  BUN 8 11  CREATININE 0.90 0.82  CALCIUM 8.9 9.6   LFT  Basename 08/15/11 0505  PROT 6.2  ALBUMIN 3.0*  AST 17  ALT 28  ALKPHOS 88  BILITOT 0.8  BILIDIR --  IBILI --  Studies/Results: Ct Abdomen Pelvis W Contrast  08/14/2011  *RADIOLOGY REPORT*  Clinical Data: Abdominal pain.  CT ABDOMEN AND PELVIS WITH CONTRAST  Technique:  Multidetector CT imaging of the abdomen and pelvis was performed following the standard protocol during bolus administration of intravenous contrast.  Contrast: OMNIPAQUE IOHEXOL 300 MG/ML IV SOLN  Comparison: 04/07/2007  Findings: Bibasilar atelectasis.  Heart is normal size.  No effusions.  Prior cholecystectomy.  There is pneumobilia.   In addition, extraluminal gas is seen in the right retroperitoneum adjacent to the right adrenal gland and liver edge in the right anterior pararenal space.  The patient reportedly had recent endoscopy. Findings are compatible with probable duodenal perforation. Stomach and duodenum appear normal.  Liver, spleen, pancreas, adrenals and kidneys have a normal appearance.  Large and small bowel grossly unremarkable.  No free fluid, free air or adenopathy.  Uterus, adnexa urinary bladder grossly unremarkable.  No acute bony abnormality.  IMPRESSION: Right retroperitoneal air and pneumobilia, both presumably related to recent endoscopy.  Findings most compatible with duodenal perforation.  Bibasilar atelectasis.  These results were discussed with Dr. Estell Harpin at the time of interpretation.  Original Report Authenticated By: Cyndie Chime, M.D.   Dg Abd Acute W/chest  08/14/2011  *RADIOLOGY REPORT*  Clinical Data: Abdominal pain.  Recent ERCP.  Next field none  ACUTE ABDOMEN SERIES (ABDOMEN 2 VIEW & CHEST 1 VIEW)  Comparison: None  Findings: Bowel gas pattern does not show evidence of ileus, obstruction or free air.  There is a moderate amount of fecal matter in the right colon.  No worrisome calcifications or bony findings.  One-view chest shows normal heart and mediastinal shadows.  There are chronic interstitial lung markings at the bases.  No free air.  IMPRESSION: No acute finding by plain radiography.  Moderate amount of fecal matter in the right colon.  Original Report Authenticated By: Thomasenia Sales, M.D.    Impression: Mallory [name] is a 55 y.o. female with moderate duodenal perforation status post ERCP with sphincterotomy 11/2. She has history of abdominal pain with elevated transaminases suggestive of sphincter of OD dysfunction. There were no stones found during ERCP. I am hopeful that Mallory [name] will require only conservative treatment, however if no improvement with bowel rest and antibiotics, she  may require surgical intervention.   Plan: 1) agree with twice a day PPI 2) agree with Zosyn q. 8 hours 3) bowel rest; she may require TPN depending on the length of her n.p.o. status 4) consider early surgical consult to follow her during hospitalization w/ early intervention if it becomes necessary 5) lipase in the a.m. 6)  continue supportive measures including pain control and antiemetics 7)  overdue for screening colonoscopy once recovered from duodenal perforation  We would like to thank you for the opportunity to participate in the care of Mallory [name].    LOS: 1 day   Lorenza Burton  08/15/2011, 8:17 AM

## 2011-08-16 ENCOUNTER — Telehealth (INDEPENDENT_AMBULATORY_CARE_PROVIDER_SITE_OTHER): Payer: Self-pay | Admitting: *Deleted

## 2011-08-16 DIAGNOSIS — R7401 Elevation of levels of liver transaminase levels: Secondary | ICD-10-CM

## 2011-08-16 LAB — BASIC METABOLIC PANEL
BUN: 7 mg/dL (ref 6–23)
Calcium: 9.5 mg/dL (ref 8.4–10.5)
Creatinine, Ser: 0.94 mg/dL (ref 0.50–1.10)
GFR calc non Af Amer: 68 mL/min — ABNORMAL LOW (ref >90)
Glucose, Bld: 102 mg/dL — ABNORMAL HIGH (ref 70–99)

## 2011-08-16 LAB — CBC
HCT: 45.3 % (ref 36.0–46.0)
Hemoglobin: 15.2 g/dL — ABNORMAL HIGH (ref 12.0–15.0)
MCH: 28.2 pg (ref 26.0–34.0)
MCHC: 33.6 g/dL (ref 30.0–36.0)

## 2011-08-16 NOTE — Telephone Encounter (Signed)
Per Dr. Karilyn Cota the patient will need LFT in 8 weeks with a office visit with Delrae Rend or him. Labs have been noted.

## 2011-08-16 NOTE — Progress Notes (Signed)
Subjective: C/o persistent 7/10 upper abd pain.  No BM x 5 days.  Denies N/V.  Pt NPO.  Objective: Vital signs in last 24 hours: Temp:  [97.9 F (36.6 C)-98.4 F (36.9 C)] 98.4 F (36.9 C) (11/06 0601) Pulse Rate:  [61-69] 69  (11/06 0601) Resp:  [16-18] 18  (11/06 0601) BP: (100-114)/(67-71) 111/71 mmHg (11/06 0601) SpO2:  [92 %-98 %] 98 % (11/06 0601) Weight:  [181 lb 14.1 oz (82.5 kg)] 181 lb 14.1 oz (82.5 kg) (11/06 0408) Last BM Date: 08/11/11 General:   Alert,  Well-developed, well-nourished, pleasant and cooperative in NAD. Eyes:  Sclera clear, no icterus.   Conjunctiva pink. Mouth:  No deformity or lesions, OP pink/moist. Neck:  Supple; no masses or thyromegaly. Heart:  Regular rate and rhythm; no murmurs, clicks, rubs,  or gallops. Abdomen:  Soft and nondistended. +moderate TTP entire abd.  No masses, hepatosplenomegaly or hernias noted. Normal bowel sounds, without guarding, and without rebound.   Msk:  Symmetrical without gross deformities. Normal posture. Extremities:  Without clubbing or edema. Neurologic:  Alert and  oriented x4;  grossly normal neurologically. Skin:  Intact without significant lesions or rashes. Psych:  Alert and cooperative. Normal mood and affect.  Intake/Output from previous day: 11/05 0701 - 11/06 0700 In: 1953.8 [I.V.:1843.8; IV Piggyback:110] Out: 750 [Urine:750] Lab Results:  Basename 08/16/11 0447 08/15/11 0505 08/14/11 1125  WBC 8.6 5.9 8.3  HGB 15.2* 14.5 15.6*  HCT 45.3 42.9 46.6*  PLT 213 206 234   BMET  Basename 08/16/11 0447 08/15/11 0505 08/14/11 1125  NA 139 140 138  K 4.1 4.1 4.3  CL 103 105 102  CO2 28 28 30   GLUCOSE 102* 112* 115*  BUN 7 8 11   CREATININE 0.94 0.90 0.82  CALCIUM 9.5 8.9 9.6   LFT  Basename 08/15/11 0505  PROT 6.2  ALBUMIN 3.0*  AST 17  ALT 28  ALKPHOS 88  BILITOT 0.8  BILIDIR --  IBILI --   Assessment: 1) Duodenal perforation s/p ERCP/sphincterotomy:  Stable.  Conservative management  medical management at present 2) GERD: Controlled  Plan: 1) Continue supportive measures 2) Continue Zosyn 3) Continue NPO  4) Will discuss management of constipation w/ Dr Darrick Penna 5) Outpatient screening colonoscopy once perforation resolved  LOS: 2 days   Lorenza Burton  08/16/2011, 7:56 AM  Discussed w/ Dr Darrick Penna.  2 Tap water enemas PR now.  PT SHOULD REMAIN NPO.  Use moist swabs for oral care. 8:47 AM

## 2011-08-16 NOTE — Progress Notes (Signed)
Subjective: States abdominal pain is about the same. She has had flatus.  Objective: Vital signs in last 24 hours: Temp:  [97.9 F (36.6 C)-98.4 F (36.9 C)] 98.4 F (36.9 C) (11/06 0601) Pulse Rate:  [61-69] 69  (11/06 0601) Resp:  [16-18] 18  (11/06 0601) BP: (100-114)/(67-71) 111/71 mmHg (11/06 0601) SpO2:  [92 %-98 %] 98 % (11/06 0601) Weight:  [82.5 kg (181 lb 14.1 oz)] 181 lb 14.1 oz (82.5 kg) (11/06 0408) Last BM Date: 08/11/11  Intake/Output from previous day: 11/05 0701 - 11/06 0700 In: 1953.8 [I.V.:1843.8; IV Piggyback:110] Out: 750 [Urine:750] Intake/Output this shift:    GI: Soft, flat. No rigidity noted. Nonspecific tenderness. Positive bowel sounds.  Lab Results:   [facility] 08/16/11 0447 08/15/11 0505  WBC 8.6 5.9  HGB 15.2* 14.5  HCT 45.3 42.9  PLT 213 206   BMET  Basename 08/16/11 0447 08/15/11 0505  NA 139 140  K 4.1 4.1  CL 103 105  CO2 28 28  GLUCOSE 102* 112*  BUN 7 8  CREATININE 0.94 0.90  CALCIUM 9.5 8.9   PT/INR No results found for this basename: LABPROT:2,INR:2 in the last 72 hours  Studies/Results: Ct Abdomen Pelvis W Contrast  08/14/2011  *RADIOLOGY REPORT*  Clinical Data: Abdominal pain.  CT ABDOMEN AND PELVIS WITH CONTRAST  Technique:  Multidetector CT imaging of the abdomen and pelvis was performed following the standard protocol during bolus administration of intravenous contrast.  Contrast: OMNIPAQUE IOHEXOL 300 MG/ML IV SOLN  Comparison: 04/07/2007  Findings: Bibasilar atelectasis.  Heart is normal size.  No effusions.  Prior cholecystectomy.  There is pneumobilia.  In addition, extraluminal gas is seen in the right retroperitoneum adjacent to the right adrenal gland and liver edge in the right anterior pararenal space.  The patient reportedly had recent endoscopy. Findings are compatible with probable duodenal perforation. Stomach and duodenum appear normal.  Liver, spleen, pancreas, adrenals and kidneys have a normal  appearance.  Large and small bowel grossly unremarkable.  No free fluid, free air or adenopathy.  Uterus, adnexa urinary bladder grossly unremarkable.  No acute bony abnormality.  IMPRESSION: Right retroperitoneal air and pneumobilia, both presumably related to recent endoscopy.  Findings most compatible with duodenal perforation.  Bibasilar atelectasis.  These results were discussed with Dr. Estell Harpin at the time of interpretation.  Original Report Authenticated By: Cyndie Chime, M.D.   Dg Abd Acute W/chest  08/14/2011  *RADIOLOGY REPORT*  Clinical Data: Abdominal pain.  Recent ERCP.  Next field none  ACUTE ABDOMEN SERIES (ABDOMEN 2 VIEW & CHEST 1 VIEW)  Comparison: None  Findings: Bowel gas pattern does not show evidence of ileus, obstruction or free air.  There is a moderate amount of fecal matter in the right colon.  No worrisome calcifications or bony findings.  One-view chest shows normal heart and mediastinal shadows.  There are chronic interstitial lung markings at the bases.  No free air.  IMPRESSION: No acute finding by plain radiography.  Moderate amount of fecal matter in the right colon.  Original Report Authenticated By: Thomasenia Sales, M.D.    Anti-infectives: Anti-infectives     Start     Dose/Rate Route Frequency Ordered Stop   08/14/11 1545  piperacillin-tazobactam (ZOSYN) IVPB 3.375 g       3.375 g 12.5 mL/hr over 240 Minutes Intravenous  Once 08/14/11 1538 08/14/11 1618          Assessment/Plan: Impression: Stable, status post duodenal perforation. Clinically, I do not  feel that there is an ongoing leak from the duodenum. I suspect the air was all from the initial ERCP/sphincterotomy. Plan: We'll discuss with Dr. Karilyn Cota concerning advancement of diet. I would continue Zosyn for now.  LOS: 2 days    [name] A 08/16/2011

## 2011-08-16 NOTE — Progress Notes (Addendum)
Pt c/o pain. No nausea or vomiting. Pain meds helping. Had good BM after 1 tap water enema. CONTINUE ABX. DR. Karilyn Cota WILL ASSUME CARE 11/7.

## 2011-08-16 NOTE — Progress Notes (Signed)
UR Chart Review Completed  

## 2011-08-16 NOTE — Telephone Encounter (Signed)
LM for a return phone call.

## 2011-08-16 NOTE — Progress Notes (Signed)
Subjective: This lady was admitted yesterday with a microperforation of the duodenum status post ERCP.She does still have some abdominal pain rated at 6/10. She denies any nausea.           Physical Exam: Blood pressure 111/71, pulse 69, temperature 98.4 F (36.9 C), temperature source Oral, resp. rate 18, height 5\' 3"  (1.6 m), weight 82.5 kg (181 lb 14.1 oz), last menstrual period 04/09/2011, SpO2 98.00%. She looks systemically well. She is not toxic or septic and she is not clinically shock. Her abdomen is soft and mildly tender as before. Bowel sounds are heard. Lung fields are clear.   Investigations: Results for orders placed during the hospital encounter of 08/14/11 (from the past 48 hour(s))  CBC     Status: Abnormal   Collection Time   08/14/11 11:25 AM      Component Value Range Comment   WBC 8.3  4.0 - 10.5 (K/uL)    RBC 5.51 (*) 3.87 - 5.11 (MIL/uL)    Hemoglobin 15.6 (*) 12.0 - 15.0 (g/dL)    HCT 78.2 (*) 95.6 - 46.0 (%)    MCV 84.6  78.0 - 100.0 (fL)    MCH 28.3  26.0 - 34.0 (pg)    MCHC 33.5  30.0 - 36.0 (g/dL)    RDW 21.3  08.6 - 57.8 (%)    Platelets 234  150 - 400 (K/uL)   DIFFERENTIAL     Status: Normal   Collection Time   08/14/11 11:25 AM      Component Value Range Comment   Neutrophils Relative 63  43 - 77 (%)    Neutro Abs 5.2  1.7 - 7.7 (K/uL)    Lymphocytes Relative 27  12 - 46 (%)    Lymphs Abs 2.2  0.7 - 4.0 (K/uL)    Monocytes Relative 7  3 - 12 (%)    Monocytes Absolute 0.5  0.1 - 1.0 (K/uL)    Eosinophils Relative 3  0 - 5 (%)    Eosinophils Absolute 0.2  0.0 - 0.7 (K/uL)    Basophils Relative 1  0 - 1 (%)    Basophils Absolute 0.0  0.0 - 0.1 (K/uL)   COMPREHENSIVE METABOLIC PANEL     Status: Abnormal   Collection Time   08/14/11 11:25 AM      Component Value Range Comment   Sodium 138  135 - 145 (mEq/L)    Potassium 4.3  3.5 - 5.1 (mEq/L)    Chloride 102  96 - 112 (mEq/L)    CO2 30  19 - 32 (mEq/L)    Glucose, Bld 115 (*) 70 - 99  (mg/dL)    BUN 11  6 - 23 (mg/dL)    Creatinine, Ser 4.69  0.50 - 1.10 (mg/dL)    Calcium 9.6  8.4 - 10.5 (mg/dL)    Total Protein 7.1  6.0 - 8.3 (g/dL)    Albumin 3.5  3.5 - 5.2 (g/dL)    AST 21  0 - 37 (U/L)    ALT 36 (*) 0 - 35 (U/L)    Alkaline Phosphatase 107  39 - 117 (U/L)    Total Bilirubin 0.6  0.3 - 1.2 (mg/dL)    GFR calc non Af Amer 80 (*) >90 (mL/min)    GFR calc Af Amer >90  >90 (mL/min)   LIPASE, BLOOD     Status: Normal   Collection Time   08/14/11 11:25 AM      Component Value Range  Comment   Lipase 46  11 - 59 (U/L)   URINALYSIS, ROUTINE W REFLEX MICROSCOPIC     Status: Normal   Collection Time   08/14/11 11:26 AM      Component Value Range Comment   Color, Urine YELLOW  YELLOW     Appearance CLEAR  CLEAR     Specific Gravity, Urine 1.015  1.005 - 1.030     pH 6.5  5.0 - 8.0     Glucose, UA NEGATIVE  NEGATIVE (mg/dL)    Hgb urine dipstick NEGATIVE  NEGATIVE     Bilirubin Urine NEGATIVE  NEGATIVE     Ketones, ur NEGATIVE  NEGATIVE (mg/dL)    Protein, ur NEGATIVE  NEGATIVE (mg/dL)    Urobilinogen, UA 0.2  0.0 - 1.0 (mg/dL)    Nitrite NEGATIVE  NEGATIVE     Leukocytes, UA NEGATIVE  NEGATIVE  MICROSCOPIC NOT DONE ON URINES WITH NEGATIVE PROTEIN, BLOOD, LEUKOCYTES, NITRITE, OR GLUCOSE <1000 mg/dL.  COMPREHENSIVE METABOLIC PANEL     Status: Abnormal   Collection Time   08/15/11  5:05 AM      Component Value Range Comment   Sodium 140  135 - 145 (mEq/L)    Potassium 4.1  3.5 - 5.1 (mEq/L)    Chloride 105  96 - 112 (mEq/L)    CO2 28  19 - 32 (mEq/L)    Glucose, Bld 112 (*) 70 - 99 (mg/dL)    BUN 8  6 - 23 (mg/dL)    Creatinine, Ser 9.14  0.50 - 1.10 (mg/dL)    Calcium 8.9  8.4 - 10.5 (mg/dL)    Total Protein 6.2  6.0 - 8.3 (g/dL)    Albumin 3.0 (*) 3.5 - 5.2 (g/dL)    AST 17  0 - 37 (U/L)    ALT 28  0 - 35 (U/L)    Alkaline Phosphatase 88  39 - 117 (U/L)    Total Bilirubin 0.8  0.3 - 1.2 (mg/dL)    GFR calc non Af Amer 72 (*) >90 (mL/min)    GFR calc Af Amer  83 (*) >90 (mL/min)   CBC     Status: Normal   Collection Time   08/15/11  5:05 AM      Component Value Range Comment   WBC 5.9  4.0 - 10.5 (K/uL)    RBC 5.08  3.87 - 5.11 (MIL/uL)    Hemoglobin 14.5  12.0 - 15.0 (g/dL)    HCT 78.2  95.6 - 21.3 (%)    MCV 84.4  78.0 - 100.0 (fL)    MCH 28.5  26.0 - 34.0 (pg)    MCHC 33.8  30.0 - 36.0 (g/dL)    RDW 08.6  57.8 - 46.9 (%)    Platelets 206  150 - 400 (K/uL)   CBC     Status: Abnormal   Collection Time   08/16/11  4:47 AM      Component Value Range Comment   WBC 8.6  4.0 - 10.5 (K/uL)    RBC 5.39 (*) 3.87 - 5.11 (MIL/uL)    Hemoglobin 15.2 (*) 12.0 - 15.0 (g/dL)    HCT 62.9  52.8 - 41.3 (%)    MCV 84.0  78.0 - 100.0 (fL)    MCH 28.2  26.0 - 34.0 (pg)    MCHC 33.6  30.0 - 36.0 (g/dL)    RDW 24.4  01.0 - 27.2 (%)    Platelets 213  150 - 400 (K/uL)  BASIC METABOLIC PANEL     Status: Abnormal   Collection Time   08/16/11  4:47 AM      Component Value Range Comment   Sodium 139  135 - 145 (mEq/L)    Potassium 4.1  3.5 - 5.1 (mEq/L)    Chloride 103  96 - 112 (mEq/L)    CO2 28  19 - 32 (mEq/L)    Glucose, Bld 102 (*) 70 - 99 (mg/dL)    BUN 7  6 - 23 (mg/dL)    Creatinine, Ser 4.09  0.50 - 1.10 (mg/dL)    Calcium 9.5  8.4 - 10.5 (mg/dL)    GFR calc non Af Amer 68 (*) >90 (mL/min)    GFR calc Af Amer 79 (*) >90 (mL/min)   LIPASE, BLOOD     Status: Normal   Collection Time   08/16/11  4:47 AM      Component Value Range Comment   Lipase 48  11 - 59 (U/L)      Ct Abdomen Pelvis W Contrast  08/14/2011  *RADIOLOGY REPORT*  Clinical Data: Abdominal pain.  CT ABDOMEN AND PELVIS WITH CONTRAST  Technique:  Multidetector CT imaging of the abdomen and pelvis was performed following the standard protocol during bolus administration of intravenous contrast.  Contrast: OMNIPAQUE IOHEXOL 300 MG/ML IV SOLN  Comparison: 04/07/2007  Findings: Bibasilar atelectasis.  Heart is normal size.  No effusions.  Prior cholecystectomy.  There is  pneumobilia.  In addition, extraluminal gas is seen in the right retroperitoneum adjacent to the right adrenal gland and liver edge in the right anterior pararenal space.  The patient reportedly had recent endoscopy. Findings are compatible with probable duodenal perforation. Stomach and duodenum appear normal.  Liver, spleen, pancreas, adrenals and kidneys have a normal appearance.  Large and small bowel grossly unremarkable.  No free fluid, free air or adenopathy.  Uterus, adnexa urinary bladder grossly unremarkable.  No acute bony abnormality.  IMPRESSION: Right retroperitoneal air and pneumobilia, both presumably related to recent endoscopy.  Findings most compatible with duodenal perforation.  Bibasilar atelectasis.  These results were discussed with Dr. Estell Harpin at the time of interpretation.  Original Report Authenticated By: Cyndie Chime, M.D.   Dg Abd Acute W/chest  08/14/2011  *RADIOLOGY REPORT*  Clinical Data: Abdominal pain.  Recent ERCP.  Next field none  ACUTE ABDOMEN SERIES (ABDOMEN 2 VIEW & CHEST 1 VIEW)  Comparison: None  Findings: Bowel gas pattern does not show evidence of ileus, obstruction or free air.  There is a moderate amount of fecal matter in the right colon.  No worrisome calcifications or bony findings.  One-view chest shows normal heart and mediastinal shadows.  There are chronic interstitial lung markings at the bases.  No free air.  IMPRESSION: No acute finding by plain radiography.  Moderate amount of fecal matter in the right colon.  Original Report Authenticated By: Thomasenia Sales, M.D.      Medications: I have reviewed the patient's current medications.  Impression: 1. Duodenal perforation. 2. Gastroesophageal reflux disease.     Plan: 1. Per Dr. Ernie Avena note, I think it is reasonable to start the patient on ice chips to see if she will tolerate this. 2. Continue to monitor closely.     LOS: 2 days   Quasean Frye C 08/16/2011, 9:55 AM

## 2011-08-17 ENCOUNTER — Encounter (INDEPENDENT_AMBULATORY_CARE_PROVIDER_SITE_OTHER): Payer: Self-pay | Admitting: *Deleted

## 2011-08-17 DIAGNOSIS — T819XXA Unspecified complication of procedure, initial encounter: Secondary | ICD-10-CM

## 2011-08-17 DIAGNOSIS — R1013 Epigastric pain: Secondary | ICD-10-CM

## 2011-08-17 DIAGNOSIS — R1012 Left upper quadrant pain: Secondary | ICD-10-CM

## 2011-08-17 DIAGNOSIS — S36899A Unspecified injury of other intra-abdominal organs, initial encounter: Secondary | ICD-10-CM

## 2011-08-17 LAB — COMPREHENSIVE METABOLIC PANEL
AST: 17 U/L (ref 0–37)
Albumin: 3.1 g/dL — ABNORMAL LOW (ref 3.5–5.2)
Calcium: 9.3 mg/dL (ref 8.4–10.5)
Creatinine, Ser: 0.9 mg/dL (ref 0.50–1.10)
GFR calc non Af Amer: 72 mL/min — ABNORMAL LOW (ref >90)

## 2011-08-17 LAB — CBC
MCH: 28.4 pg (ref 26.0–34.0)
MCV: 83.6 fL (ref 78.0–100.0)
Platelets: 213 10*3/uL (ref 150–400)
RDW: 13.4 % (ref 11.5–15.5)

## 2011-08-17 MED ORDER — SODIUM CHLORIDE 0.9 % IJ SOLN
INTRAMUSCULAR | Status: AC
  Administered 2011-08-17: 10 mL
  Filled 2011-08-17: qty 3

## 2011-08-17 MED ORDER — PIPERACILLIN-TAZOBACTAM 3.375 G IVPB
3.3750 g | Freq: Three times a day (TID) | INTRAVENOUS | Status: DC
  Administered 2011-08-17 – 2011-08-19 (×6): 3.375 g via INTRAVENOUS
  Filled 2011-08-17 (×7): qty 50

## 2011-08-17 MED ORDER — MORPHINE SULFATE 2 MG/ML IJ SOLN
4.0000 mg | INTRAMUSCULAR | Status: DC | PRN
  Administered 2011-08-17 – 2011-08-18 (×5): 4 mg via INTRAVENOUS
  Filled 2011-08-17 (×5): qty 2

## 2011-08-17 NOTE — Progress Notes (Addendum)
Subjective: Since I last evaluated the patient  She c/o epigastric pain and left upper quadrant pain.  She says she is 5% better since admission.  Her pain is worse when ambulating to the bathroom. She denies any fever.  She tells me that she had a good BM after receiving 2 enemas yesterday.  Hx of recent microperforation to the duodenum.  At present she is NPO except for ice chips.  CT abdomen and pelvis with CM 11/4.2012 IMPRESSION:  Right retroperitoneal air and pneumobilia, both presumably related  to recent endoscopy. Findings most compatible with duodenal  perforation.   Objective: Vital signs in last 24 hours: Temp:  [97.8 F (36.6 C)-98.1 F (36.7 C)] 98 F (36.7 C) (11/07 0550) Pulse Rate:  [69-70] 70  (11/07 0550) Resp:  [16-18] 16  (11/07 0550) BP: (101-118)/(69-80) 117/75 mmHg (11/07 0550) SpO2:  [94 %-95 %] 95 % (11/07 0550) Last BM Date: 08/11/11  Intake/Output from previous day: 11/06 0701 - 11/07 0700 In: 60 [IV Piggyback:60] Out: 1551 [Urine:1550; Stool:1] Intake/Output this shift:    General appearance: alert, cooperative and no distress.  Alert and oriented. Skin warm and dry. Oral mucosa is moist.  Sclera anicteric, conjunctivae is pink. Th      Lungs clear. Heart regular rate and rhythm.  Abdomen is soft. Bowel sounds are positive. No hepatomegaly. No abdominal masses felt.Tenderness to epigastric region and left upper quadrant on palpation. .  No edema to lower extremities. Patient is alert and oriented.  . Lab Results:  Basename 08/17/11 0440 08/16/11 0447 08/15/11 0505  WBC 6.1 8.6 5.9  HGB 15.0 15.2* 14.5  HCT 44.2 45.3 42.9  PLT 213 213 206   BMET  Basename 08/17/11 0440 08/16/11 0447 08/15/11 0505  NA 139 139 140  K 3.9 4.1 4.1  CL 105 103 105  CO2 27 28 28   GLUCOSE 114* 102* 112*  BUN 8 7 8   CREATININE 0.90 0.94 0.90  CALCIUM 9.3 9.5 8.9   LFT  Basename 08/17/11 0440  PROT 6.8  ALBUMIN 3.1*  AST 17  ALT 21  ALKPHOS 84  BILITOT 0.8   BILIDIR --  IBILI --   PT/INR No results found for this basename: LABPROT:2,INR:2 in the last 72 hours Hepatitis Panel No results found for this basename: HEPBSAG,HCVAB,HEPAIGM,HEPBIGM in the last 72 hours C-Diff No results found for this basename: CDIFFTOX:3 in the last 72 hours Fecal Lactopherrin No results found for this basename: FECLLACTOFRN in the last 72 hours  Studies/Results: No results found.  Medications: I have reviewed the patient's current medications.  Assessment/Plan:  Microperforation of the duodenum s/p ERCP.  Dr. Karilyn Cota will be in later to talk with patient.   SETZER,Michaila W 08/17/2011, 9:13 AM  Patient examined CT abdomen and pelvis reviewed with Dr. Ulyses Southward. Patient continues to complain of pain primarily across upper abdomen more on the left side. She had good result with enemas yesterday and noted  scant amount of bright red blood per rectum felt to be secondary to hemorrhoids. Her abdominal exam reveals normal bowel sounds, soft abdomen with mild vague tenderness in epigastric region, right upper quadrant as well as left upper and left lower quadrant. No guarding or rebound noted. Assessment. Patient felt to have microperforation of duodenum following biliary sphincterotomy. CT with oral contrast failed to show extra physician of contrast. However patient's symptoms have not improved with current medical therapy. Since she is not improving at would recommend decompression of upper GI tract with NG  tube and patient is agreeable. Plan repeat CT on 08/19/2011 with contrast via. NG tube.

## 2011-08-17 NOTE — Progress Notes (Signed)
Subjective: Still has left upper quadrant abdominal pain. Spoke with Dr. Karilyn Cota who is planning on placing an NG tube this morning.  Objective: Vital signs in last 24 hours: Temp:  [97.8 F (36.6 C)-98.1 F (36.7 C)] 98 F (36.7 C) (11/07 0550) Pulse Rate:  [69-70] 70  (11/07 0550) Resp:  [16-18] 16  (11/07 0550) BP: (101-118)/(69-80) 117/75 mmHg (11/07 0550) SpO2:  [94 %-95 %] 95 % (11/07 0550) Weight change:  Last BM Date: 08/11/11  Intake/Output from previous day: 11/06 0701 - 11/07 0700 In: 60 [IV Piggyback:60] Out: 1551 [Urine:1550; Stool:1]     Physical Exam: General: Alert, awake, oriented x3, in no acute distress. HEENT: No bruits, no goiter. Heart: Regular rate and rhythm, without murmurs, rubs, gallops. Lungs: Clear to auscultation bilaterally. Abdomen: Soft, tender to palpation of left upper quadrant, nondistended, hypoactive bowel sounds. Extremities: No clubbing cyanosis or edema with positive pedal pulses. Neuro: Grossly intact, nonfocal.    Lab Results: Basic Metabolic Panel:  Basename 08/17/11 0440 08/16/11 0447  NA 139 139  K 3.9 4.1  CL 105 103  CO2 27 28  GLUCOSE 114* 102*  BUN 8 7  CREATININE 0.90 0.94  CALCIUM 9.3 9.5  MG -- --  PHOS -- --   Liver Function Tests:  [facility] 08/17/11 0440 08/15/11 0505  AST 17 17  ALT 21 28  ALKPHOS 84 88  BILITOT 0.8 0.8  PROT 6.8 6.2  ALBUMIN 3.1* 3.0*    Basename 08/16/11 0447 08/14/11 1125  LIPASE 48 46  AMYLASE -- --   CBC:  Basename 08/17/11 0440 08/16/11 0447 08/14/11 1125  WBC 6.1 8.6 --  NEUTROABS -- -- 5.2  HGB 15.0 15.2* --  HCT 44.2 45.3 --  MCV 83.6 84.0 --  PLT 213 213 --   Studies/Results: No results found.  Medications: Scheduled Meds:   . pantoprazole (PROTONIX) IV  40 mg Intravenous Q12H  . piperacillin-tazobactam  3.375 g Intravenous Q8H   Continuous Infusions:   . dextrose 5 % and 0.9 % NaCl with KCl 40 mEq/L 75 mL/hr at 08/16/11 1649   PRN Meds:.morphine,  morphine, ondansetron (ZOFRAN) IV, DISCONTD: morphine  Assessment/Plan:  Principal Problem:  *Duodenal perforation status post ERCP: Continue current plan of care. For NG tube placement this morning. Continue Zosyn. No plans for surgical intervention at the moment. Active Problems:  GERD (gastroesophageal reflux disease): Continue proton pump inhibitor.    LOS: 3 days   Mallory [name],Mallory [name] 08/17/2011, 9:47 AM

## 2011-08-18 ENCOUNTER — Encounter (HOSPITAL_COMMUNITY): Payer: Self-pay | Admitting: Internal Medicine

## 2011-08-18 LAB — BASIC METABOLIC PANEL
BUN: 9 mg/dL (ref 6–23)
CO2: 25 mEq/L (ref 19–32)
Calcium: 9.7 mg/dL (ref 8.4–10.5)
Chloride: 103 mEq/L (ref 96–112)
Creatinine, Ser: 0.73 mg/dL (ref 0.50–1.10)

## 2011-08-18 MED ORDER — SODIUM CHLORIDE 0.9 % IJ SOLN
INTRAMUSCULAR | Status: AC
  Administered 2011-08-18: 10 mL
  Filled 2011-08-18: qty 3

## 2011-08-18 NOTE — Progress Notes (Signed)
Subjective; patient states her pain has decreased significantly; now she describes his pain to be mild primarily in left upper quadrant and epigastric region. She is passing flatus. He complains of sore throat secondary to NG tube. She has not required pain medication and several hours Objective; BP 120/82  Pulse 84  Temp(Src) 97.9 F (36.6 C) (Axillary)  Resp 20  Ht 5\' 3"  (1.6 m)  Wt 181 lb 14.1 oz (82.5 kg)  BMI 32.22 kg/m2  SpO2 90%  LMP 04/09/2011 Abdomen is symmetrical bowel sounds are normal. Palpation reveals soft abdomen with mild tenderness at epigastric region. NG return; 300 cc last  24 hours. Assessment. Post ERCP/sphincterotomy microperforation of duodenum without extravasation of contrast on admission CT. Pain has decreased significantly since NG tube was placed. Her abdominal exam is relatively benign. Recommendations Leave NG tube in for another 24 hours. Abdominal  CT with contrast  through the tube in a.m.

## 2011-08-18 NOTE — Progress Notes (Signed)
Subjective: Feels a little better after NG tube placement.  Objective: Vital signs in last 24 hours: Temp:  [97.9 F (36.6 C)-98.5 F (36.9 C)] 97.9 F (36.6 C) (11/08 0635) Pulse Rate:  [67-84] 84  (11/08 0635) Resp:  [20] 20  (11/08 0635) BP: (120-121)/(81-82) 120/82 mmHg (11/08 0635) SpO2:  [90 %-94 %] 90 % (11/08 1610) Weight change:  Last BM Date: 08/17/11  Intake/Output from previous day: 11/07 0701 - 11/08 0700 In: 1110 [I.V.:900; IV Piggyback:210] Out: 300 [Emesis/NG output:300] Total I/O In: -  Out: 200 [Urine:200]   Physical Exam: General: Alert, awake, oriented x3, in no acute distress. HEENT: No bruits, no goiter. Heart: Regular rate and rhythm, without murmurs, rubs, gallops. Lungs: Clear to auscultation bilaterally. Abdomen: Soft, tender to palpation of the epigastrium and left upper quadrant, nondistended, hypoactive bowel sounds. Extremities: No clubbing cyanosis or edema with positive pedal pulses. Neuro: Grossly intact, nonfocal.    Lab Results: Basic Metabolic Panel:  Basename 08/18/11 1214 08/17/11 0440  NA 138 139  K 4.1 3.9  CL 103 105  CO2 25 27  GLUCOSE 111* 114*  BUN 9 8  CREATININE 0.73 0.90  CALCIUM 9.7 9.3  MG -- --  PHOS -- --   Liver Function Tests:  [facility] 08/17/11 0440  AST 17  ALT 21  ALKPHOS 84  BILITOT 0.8  PROT 6.8  ALBUMIN 3.1*    Basename 08/16/11 0447  LIPASE 48  AMYLASE --   CBC:  Basename 08/17/11 0440 08/16/11 0447  WBC 6.1 8.6  NEUTROABS -- --  HGB 15.0 15.2*  HCT 44.2 45.3  MCV 83.6 84.0  PLT 213 213   Studies/Results: No results found.  Medications: Scheduled Meds:   . pantoprazole (PROTONIX) IV  40 mg Intravenous Q12H  . piperacillin-tazobactam (ZOSYN)  IV  3.375 g Intravenous Q8H  . sodium chloride       Continuous Infusions:   . dextrose 5 % and 0.9 % NaCl with KCl 40 mEq/L 125 mL/hr at 08/17/11 2202   PRN Meds:.morphine, morphine, ondansetron (ZOFRAN)  IV  Assessment/Plan:  Principal Problem:  *Duodenal perforation Active Problems:  GERD (gastroesophageal reflux disease)  #1 post ERCP microperforation of the duodenum without extravasation of contrast on admission CT scan: Patient feels that her abdominal pain is slightly improved after placement of NG tube. Plan as per GI is to continue NG tube suctioning today and to repeat a CAT scan in the morning. Continue IV antibiotics.   LOS: 4 days   [name],[name] 08/18/2011, 2:06 PM

## 2011-08-19 ENCOUNTER — Inpatient Hospital Stay (HOSPITAL_COMMUNITY): Payer: Self-pay

## 2011-08-19 MED ORDER — IOHEXOL 300 MG/ML  SOLN
100.0000 mL | Freq: Once | INTRAMUSCULAR | Status: AC | PRN
  Administered 2011-08-19: 100 mL via INTRAVENOUS

## 2011-08-19 MED ORDER — SUCRALFATE 1 GM/10ML PO SUSP
1.0000 g | Freq: Three times a day (TID) | ORAL | Status: DC
  Administered 2011-08-19 – 2011-08-20 (×4): 1 g via ORAL
  Filled 2011-08-19 (×4): qty 10

## 2011-08-19 MED ORDER — CEFAZOLIN SODIUM 1-5 GM-% IV SOLN: 1.0000 g | INTRAVENOUS | Status: DC

## 2011-08-19 MED ORDER — KCL IN DEXTROSE-NACL 40-5-0.9 MEQ/L-%-% IV SOLN
INTRAVENOUS | Status: DC
  Administered 2011-08-19 (×2): via INTRAVENOUS
  Filled 2011-08-19 (×4): qty 1000

## 2011-08-19 MED ORDER — PANTOPRAZOLE SODIUM 40 MG PO TBEC
40.0000 mg | DELAYED_RELEASE_TABLET | Freq: Two times a day (BID) | ORAL | Status: DC
  Administered 2011-08-19 – 2011-08-20 (×2): 40 mg via ORAL
  Filled 2011-08-19 (×2): qty 1

## 2011-08-19 NOTE — Telephone Encounter (Signed)
appt scheduled

## 2011-08-19 NOTE — Progress Notes (Signed)
Subjective: NG tube is now out. She feels much better.  Objective: Vital signs in last 24 hours: Temp:  [97.4 F (36.3 C)-98.2 F (36.8 C)] 97.4 F (36.3 C) (11/09 0616) Pulse Rate:  [64-72] 72  (11/09 0616) Resp:  [20-22] 20  (11/09 0616) BP: (115-136)/(78-83) 136/83 mmHg (11/09 0616) SpO2:  [90 %] 90 % (11/09 0616) Weight change:  Last BM Date: 08/17/11  Intake/Output from previous day: 11/08 0701 - 11/09 0700 In: 1550 [I.V.:1500; IV Piggyback:50] Out: 450 [Urine:200; Emesis/NG output:250]     Physical Exam: General: Alert, awake, oriented x3, in no acute distress. HEENT: No bruits, no goiter. Heart: Regular rate and rhythm, without murmurs, rubs, gallops. Lungs: Clear to auscultation bilaterally. Abdomen: Soft, tender to palpation to epigastrium and left upper quadrant, nondistended, positive bowel sounds. Extremities: No clubbing cyanosis or edema with positive pedal pulses. Neuro: Grossly intact, nonfocal.    Lab Results: Basic Metabolic Panel:  Basename 08/18/11 1214 08/17/11 0440  NA 138 139  K 4.1 3.9  CL 103 105  CO2 25 27  GLUCOSE 111* 114*  BUN 9 8  CREATININE 0.73 0.90  CALCIUM 9.7 9.3  MG -- --  PHOS -- --   Liver Function Tests:  [facility] 08/17/11 0440  AST 17  ALT 21  ALKPHOS 84  BILITOT 0.8  PROT 6.8  ALBUMIN 3.1*   CBC:  Basename 08/17/11 0440  WBC 6.1  NEUTROABS --  HGB 15.0  HCT 44.2  MCV 83.6  PLT 213    Studies/Results: Ct Abdomen Pelvis W Contrast  08/19/2011  *RADIOLOGY REPORT*  Clinical Data:  Abdominal pain, post micro perforation during ERCP/sphincterotomy  CT ABDOMEN AND PELVIS WITH CONTRAST  Technique:  Multidetector CT imaging of the abdomen and pelvis was performed following the standard protocol during bolus administration of intravenous contrast. Sagittal and coronal MPR images reconstructed from axial data set.  Contrast: OMNIPAQUE IOHEXOL 300 MG/ML IV SOLN. Dilute oral contrast.  Comparison: 08/14/2011   Findings: Atelectasis bilateral lower lobes. No pleural effusion and basilar pneumothorax. Post cholecystectomy and sphincterotomy with persistent pneumobilia. Tiny foci of extraintestinal gas identified adjacent to the liver compatible with previously seen microperforation. Amount of extraintestinal gas in the perihepatic region has significantly decreased since previous exam. No free intraperitoneal air identified.  Liver, spleen, pancreas, kidneys, and adrenal glands normal appearance. Stomach and bowel loops unremarkable. No evidence of GI contrast extravasation from the duodenum. Tiny umbilical hernia containing fat. Normal appendix. Unremarkable uterus, adnexae, and bladder. No mass, adenopathy, or free fluid. No acute osseous findings.  IMPRESSION: Decrease in amount of extra intestinal gas in the perihepatic region from prior microperforation post ERCP. No evidence of abscess, free intraperitoneal air, or GI contrast extravasation. Bibasilar atelectasis. Tiny umbilical hernia containing fat.  Original Report Authenticated By: Mallory [name], M.D.    Medications: Scheduled Meds:   . ceFAZolin (ANCEF) IV  1 g Intravenous 60 min Pre-Op  . pantoprazole (PROTONIX) IV  40 mg Intravenous Q12H  . piperacillin-tazobactam (ZOSYN)  IV  3.375 g Intravenous Q8H   Continuous Infusions:   . dextrose 5 % and 0.9 % NaCl with KCl 40 mEq/L 125 mL/hr at 08/18/11 2138   PRN Meds:.iohexol, morphine, morphine, ondansetron (ZOFRAN) IV  Assessment/Plan:  Principal Problem:  *Duodenal perforation Active Problems:  GERD (gastroesophageal reflux disease)  #1 post ERCP microperforation of the duodenum: Repeat CT scan actually shows a decrease in amount of air. There is no evidence of abscess or GI contrast extravasation. She's  not having any fevers, her abdominal pain has improved, she has not had any leukocytosis. I will go ahead and discontinue her Zosyn at this time. GI has started her on a clear liquid diet to  advance as tolerated, when she is eating some solid foods and has significant decrease in abdominal pain we can consider discharging her home. I suspect this should happen in about 24-48 hours.   LOS: 5 days   Mallory [name] 08/19/2011, 10:30 AM

## 2011-08-19 NOTE — Progress Notes (Signed)
Patient had abdominopelvic CT with contrast earlier today. This study revealed scant amount of air or small air bubbles in the retroperitoneum, much less than on previous study. No fluid collection was noted. Therefore NG tube was removed and patient begun on full liquids. He complains of mild epigastric pain which has not gotten worse with full liquids. She is hungry. She had 3 loose stools today. No melena or rectal bleeding noted. Patient remains afebrile. Abdominal exam reveals normal bowel sounds soft abdomen with mild midepigastric tenderness. Assessment. Microperforation of duodenum following biliary sphincterotomy at ERCP one week ago. It appears fairly test completely sealed off. No fluid collection noted on either of the CT scans. Current study shows significant decrease in retroperitoneal air. She had no difficulty with full liquids. The need to check her for C. difficile given that she has been on antibiotics for 5 days. Recommendations Advance diet to low-sodium diet. Stool for C. difficile by PCR. Carafate added; I would like her to continue this for 2 weeks. Pantoprazole switched to oral route. Unless she has problems with low-salt diet she should be able to go home in a.m. We will see her in office 2 weeks.

## 2011-08-20 LAB — CBC
Hemoglobin: 14.3 g/dL (ref 12.0–15.0)
MCH: 27.3 pg (ref 26.0–34.0)
Platelets: 217 10*3/uL (ref 150–400)
RBC: 5.23 MIL/uL — ABNORMAL HIGH (ref 3.87–5.11)
WBC: 5.6 10*3/uL (ref 4.0–10.5)

## 2011-08-20 LAB — COMPREHENSIVE METABOLIC PANEL
BUN: 7 mg/dL (ref 6–23)
CO2: 27 mEq/L (ref 19–32)
Calcium: 9.5 mg/dL (ref 8.4–10.5)
Chloride: 106 mEq/L (ref 96–112)
Creatinine, Ser: 0.72 mg/dL (ref 0.50–1.10)
GFR calc Af Amer: >90 mL/min (ref >90)
GFR calc non Af Amer: >90 mL/min (ref >90)
Glucose, Bld: 131 mg/dL — ABNORMAL HIGH (ref 70–99)
Total Bilirubin: 0.5 mg/dL (ref 0.3–1.2)

## 2011-08-20 MED ORDER — HYDROCODONE-ACETAMINOPHEN 5-500 MG PO TABS: 1.0000 | ORAL_TABLET | Freq: Four times a day (QID) | ORAL | Status: AC | PRN

## 2011-08-20 NOTE — Discharge Summary (Signed)
  Physician Discharge Summary  Patient ID: KEYMANI GLYNN MRN: [id number] DOB/AGE: 18-Aug-1956 55 y.o.  Admit date: 08/14/2011 Discharge date: 08/20/2011  Primary Care Physician:  Cassell Smiles., MD   Discharge Diagnoses:    Principal Problem:  *Duodenal micro perforation after ERCP Active Problems:  GERD (gastroesophageal reflux disease)    Current Discharge Medication List    START taking these medications   Details  HYDROcodone-acetaminophen (VICODIN) 5-500 MG per tablet Take 1 tablet by mouth every 6 (six) hours as needed for pain. Qty: 30 tablet, Refills: 0      CONTINUE these medications which have NOT CHANGED   Details  pantoprazole (PROTONIX) 40 MG tablet Take 1 tablet (40 mg total) by mouth daily. Qty: 30 tablet, Refills: 5         Disposition and Follow-up:  To be discharged home today in stable and improved condition. Will followup with Dr. Karilyn Cota as scheduled.  Consults:  GI Dr. Karilyn Cota   Significant Diagnostic Studies:  Ct Abdomen Pelvis W Contrast  08/19/2011  *RADIOLOGY REPORT*  Clinical Data:  Abdominal pain, post micro perforation during ERCP/sphincterotomy  CT ABDOMEN AND PELVIS WITH CONTRAST  Technique:  Multidetector CT imaging of the abdomen and pelvis was performed following the standard protocol during bolus administration of intravenous contrast. Sagittal and coronal MPR images reconstructed from axial data set.  Contrast: OMNIPAQUE IOHEXOL 300 MG/ML IV SOLN. Dilute oral contrast.  Comparison: 08/14/2011  Findings: Atelectasis bilateral lower lobes. No pleural effusion and basilar pneumothorax. Post cholecystectomy and sphincterotomy with persistent pneumobilia. Tiny foci of extraintestinal gas identified adjacent to the liver compatible with previously seen microperforation. Amount of extraintestinal gas in the perihepatic region has significantly decreased since previous exam. No free intraperitoneal air identified.  Liver, spleen, pancreas,  kidneys, and adrenal glands normal appearance. Stomach and bowel loops unremarkable. No evidence of GI contrast extravasation from the duodenum. Tiny umbilical hernia containing fat. Normal appendix. Unremarkable uterus, adnexae, and bladder. No mass, adenopathy, or free fluid. No acute osseous findings.  IMPRESSION: Decrease in amount of extra intestinal gas in the perihepatic region from prior microperforation post ERCP. No evidence of abscess, free intraperitoneal air, or GI contrast extravasation. Bibasilar atelectasis. Tiny umbilical hernia containing fat.  Original Report Authenticated By: Lollie Marrow, M.D.    Brief H and P: For complete details please refer to admission H and P, but in brief patient is a 55 year old woman without significant past medical history who presented to the hospital with abdominal pain 2 days after an ERCP. A CT scan done in the emergency department showed findings consistent with a duodenal perforation she was admitted for further evaluation.    Hospital Course:  Principal Problem:  *Duodenal perforation Active Problems:  GERD (gastroesophageal reflux disease)  #1 microperforation of the duodenum following ERCP: Patient is now without pain, tolerating solid diet. Did not require surgery. History well today and ready for discharge home. Repeat CT scan showed decrease in free air no fluid collections. Followup with Dr. Karilyn Cota as scheduled in one week  Time spent on Discharge: Greater than 30 minutes.  SignedChaya Jan 08/20/2011, 9:25 AM

## 2011-08-20 NOTE — Progress Notes (Signed)
PIV removed without complaint, patient discharged home. Patient verbalizes understanding of discharge instructions, follow up appointments and discharge instructions. Patient escorted out by staff, transported home by family.

## 2011-08-22 ENCOUNTER — Ambulatory Visit (INDEPENDENT_AMBULATORY_CARE_PROVIDER_SITE_OTHER): Payer: Self-pay | Admitting: Internal Medicine

## 2011-08-25 ENCOUNTER — Ambulatory Visit (INDEPENDENT_AMBULATORY_CARE_PROVIDER_SITE_OTHER): Payer: Self-pay | Admitting: Internal Medicine

## 2011-08-25 ENCOUNTER — Encounter (INDEPENDENT_AMBULATORY_CARE_PROVIDER_SITE_OTHER): Payer: Self-pay | Admitting: Internal Medicine

## 2011-08-25 DIAGNOSIS — R109 Unspecified abdominal pain: Secondary | ICD-10-CM

## 2011-08-25 NOTE — Patient Instructions (Signed)
Can return to work on 08-29-2011.

## 2011-08-25 NOTE — Progress Notes (Signed)
Presenting complaint; Followup for recent hospitalization for abdominal pain secondary to micro-duodenal perforation. 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 She presented 2 days later with upper abdominal pain and had air and retroperitoneal gout fluid collection or an abscess she was treated conservatively with improvement. She was admitted on 08/14/2011 and was discharged on 08/20/2011. She is still having some discomfort of her breakfast in epigastric region but is mild and does not last as long. She has not taken any pain medication since she left the hospital. She is constipated she only had one bowel movements since her discharge. Her appetite is good. She denies nausea vomiting or fever. Her heartburn is well controlled with therapy. Current Medications: Current Outpatient Prescriptions  Medication Sig Dispense Refill  . HYDROcodone-acetaminophen (VICODIN) 5-500 MG per tablet Take 1 tablet by mouth every 6 (six) hours as needed for pain.  30 tablet  0  . pantoprazole (PROTONIX) 40 MG tablet Take 1 tablet (40 mg total) by mouth daily.  30 tablet  5    Objective: BP 118/84  Pulse 60  Temp 98.4 F (36.9 C)  Ht 5\' 3"  (1.6 m)  Wt 176 lb (79.833 kg)  BMI 31.18 kg/m2  LMP 04/09/2011   General:   Alert,  Well-developed, well-nourished, pleasant and cooperative in NAD Abdomen:  Soft with mild midepigastric tenderness on deep palpation. No organomegaly or masses noted to Extremities:  Without clubbing or edema.       Labs/studies Results: LFTs from 08/20/2011 were normal except albumin of 3.1.. AST was 14 ALT 16.   Assessment: Upper abdominal pain secondary to micro-duodenal perforation following biliary sphincterotomy responding to conservative therapy. Followup CT prior to discharge revealed almost complete resolution of retroperitoneal air. GERD symptoms are well controlled with  therapy. Constipation   Plan: Patient is to take Colace 2 tablets daily at bedtime. She will call if she has worsening pain or fever otherwise return for office visit in 3 months. We will check her LFTs then. Patient will return to work on 08/29/2011. Note given

## 2011-08-30 ENCOUNTER — Telehealth (INDEPENDENT_AMBULATORY_CARE_PROVIDER_SITE_OTHER): Payer: Self-pay | Admitting: *Deleted

## 2011-08-30 DIAGNOSIS — R7401 Elevation of levels of liver transaminase levels: Secondary | ICD-10-CM

## 2011-08-30 NOTE — Telephone Encounter (Signed)
Per Dr. Karilyn Cota the patient will need these labs done prior to appointment in 3 months.

## 2011-09-07 ENCOUNTER — Telehealth (INDEPENDENT_AMBULATORY_CARE_PROVIDER_SITE_OTHER): Payer: Self-pay | Admitting: *Deleted

## 2011-09-16 NOTE — Telephone Encounter (Signed)
addressed

## 2011-10-06 ENCOUNTER — Telehealth (INDEPENDENT_AMBULATORY_CARE_PROVIDER_SITE_OTHER): Payer: Self-pay | Admitting: *Deleted

## 2011-10-06 NOTE — Telephone Encounter (Signed)
Lab noted.

## 2011-11-03 ENCOUNTER — Ambulatory Visit (INDEPENDENT_AMBULATORY_CARE_PROVIDER_SITE_OTHER): Payer: Self-pay | Admitting: Internal Medicine

## 2011-11-03 ENCOUNTER — Encounter (INDEPENDENT_AMBULATORY_CARE_PROVIDER_SITE_OTHER): Payer: Self-pay | Admitting: Internal Medicine

## 2011-11-03 VITALS — BP 104/62 | HR 64 | Temp 98.2°F | Ht 63.0 in | Wt 177.3 lb

## 2011-11-03 DIAGNOSIS — R748 Abnormal levels of other serum enzymes: Secondary | ICD-10-CM

## 2011-11-03 DIAGNOSIS — R1013 Epigastric pain: Secondary | ICD-10-CM

## 2011-11-03 DIAGNOSIS — R7401 Elevation of levels of liver transaminase levels: Secondary | ICD-10-CM

## 2011-11-03 LAB — COMPREHENSIVE METABOLIC PANEL
ALT: 39 U/L — ABNORMAL HIGH (ref 0–35)
Albumin: 4.1 g/dL (ref 3.5–5.2)
CO2: 24 mEq/L (ref 19–32)
Chloride: 104 mEq/L (ref 96–112)
Potassium: 4 mEq/L (ref 3.5–5.3)
Sodium: 140 mEq/L (ref 135–145)
Total Bilirubin: 0.5 mg/dL (ref 0.3–1.2)
Total Protein: 6.7 g/dL (ref 6.0–8.3)

## 2011-11-03 MED ORDER — DICYCLOMINE HCL 10 MG PO CAPS: 10.0000 mg | ORAL_CAPSULE | Freq: Two times a day (BID) | ORAL | Status: DC

## 2011-11-03 NOTE — Progress Notes (Signed)
Addended by: Len Blalock on: 11/03/2011 12:37 PM   Modules accepted: Orders

## 2011-11-03 NOTE — Progress Notes (Addendum)
Subjective:     Patient ID: Mallory [name], female   DOB: 24-Aug-1956, 56 y.o.   MRN: [id number]  HPI  Mallory [name] is a 56 yr old female presenting today with c/o that ever since she had the ERCP, she c/o of spasms or a tight feeling in her upper epigastric region.  It is a grabbing type pain.  This will last for 2-3 minutes and then resolve. This is occuring about 2 times a day.  Symptoms since November. Occurs sporadically.  She denies any acid reflux. Her appetite is good. No weight.  She tells me this is not the same pain she had when she had elevated transaminases.    Hx of  micro-duodenal perforation following biliary sphincterotomy that responded conservative therapy. Followup CT prior to discharge revealed almost complete resolution of retroperitoneal air.   Mallory [name] had been referred to our office for elevated transaminases and rt upper quadrant pain x 3 weeks ion October of 2012. She had undergone an [place] which was essential normal.  Her rt upper quadrant lasted 3 days a resolved. Repeat transaminases by me were normal. It was felt she had past the stone.  However she called the office a few weeks later and repeat labs revealed her transaminases were elevated.  Dr. Karilyn Cota performed an ERCP with a sphincterotomy in November.   Review of Systems Current Outpatient Prescriptions  Medication Sig Dispense Refill  . pantoprazole (PROTONIX) 40 MG tablet Take 1 tablet (40 mg total) by mouth daily.  30 tablet  5      Current Outpatient Prescriptions  Medication Sig Dispense Refill  . pantoprazole (PROTONIX) 40 MG tablet Take 1 tablet (40 mg total) by mouth daily.  30 tablet  5   Current Outpatient Prescriptions on File Prior to Visit  Medication Sig Dispense Refill  . pantoprazole (PROTONIX) 40 MG tablet Take 1 tablet (40 mg total) by mouth daily.  30 tablet  5   Past Medical History  Diagnosis Date  . Pulmonary embolism     5 yrs ago (unknown region)  . GERD (gastroesophageal reflux disease)     Past Surgical History  Procedure Date  . Cesarean section 1991    [facility]  . Cholecystectomy 15 yrs ago    [facility]  . Ercp w/ sphicterotomy 08/12/11    Dr Rehman-?microlithiasis, SOD  . Ercp 08/12/2011    Procedure: ENDOSCOPIC RETROGRADE CHOLANGIOPANCREATOGRAPHY (ERCP);  Surgeon: Malissa Hippo, MD;  Location: [facility];  Service: Endoscopy;  Laterality: N/A;  . Sphincterotomy 08/12/2011    Procedure: SPHINCTEROTOMY;  Surgeon: Malissa Hippo, MD;  Location: [facility];  Service: Endoscopy;;  with ballon passage   History   Social History  . Marital Status: Divorced    Spouse Name: N/A    Number of Children: 2  . Years of Education: N/A   Occupational History  . [employer]    Social History Main Topics  . Smoking status: Never Smoker   . Smokeless tobacco: Not on file  . Alcohol Use: No  . Drug Use: No  . Sexually Active: Yes    Birth Control/ Protection: Post-menopausal   Other Topics Concern  . Not on file   Social History Narrative   Lives w/ 1 yr old daughter.  2 daughters   Family Status  Relation Status Death Age  . Mother Alive   . Father Deceased     CHF  . Brother Alive   . Sister Alive     with a hx  of an MI  . Child Alive     one has tumors of her adrenal glands.  . Child Deceased     Pancreas-phochromocytoma-ex-husband's side   Allergies  Allergen Reactions  . Phenergan Hives  . Hydromorphone Itching     Objective:   Physical Exam  Filed Vitals:   11/03/11 1131  Height: 5\' 3"  (1.6 m)  Weight: 177 lb 4.8 oz (80.423 kg)  Alert and oriented. Skin warm and dry. Oral mucosa is moist.   . Sclera anicteric, conjunctivae is pink. Thyroid not enlarged. No cervical lymphadenopathy. Lungs clear. Heart regular rate and rhythm.  Abdomen is soft. Bowel sounds are positive. No hepatomegaly. No abdominal masses felt. Slight tenderness epigastric region.  No edema to lower extremities. Patient is alert and oriented.       Assessment:    Epigastric pain. Patient is  presently taking Protonix. No acid reflux.  I discussed this case with Dr. Karilyn Cota.    Plan:     Will repeat a CMET on her  .Dicyclomine 10mg  BID before breakfast and lunch.

## 2011-11-03 NOTE — Patient Instructions (Signed)
Cmet today. I will discuss with Dr. Karilyn Cota.

## 2011-11-16 ENCOUNTER — Encounter (INDEPENDENT_AMBULATORY_CARE_PROVIDER_SITE_OTHER): Payer: Self-pay | Admitting: *Deleted

## 2011-11-17 ENCOUNTER — Telehealth (INDEPENDENT_AMBULATORY_CARE_PROVIDER_SITE_OTHER): Payer: Self-pay | Admitting: Internal Medicine

## 2011-11-17 ENCOUNTER — Telehealth (INDEPENDENT_AMBULATORY_CARE_PROVIDER_SITE_OTHER): Payer: Self-pay | Admitting: *Deleted

## 2011-11-17 DIAGNOSIS — R7401 Elevation of levels of liver transaminase levels: Secondary | ICD-10-CM

## 2011-11-17 MED ORDER — HYDROCODONE-ACETAMINOPHEN 5-500 MG PO TABS: 1.0000 | ORAL_TABLET | Freq: Four times a day (QID) | ORAL | Status: DC | PRN

## 2011-11-17 NOTE — Telephone Encounter (Signed)
Ct sch'd 11/18/11 @ 11 am (10:45), if patient isn't able to get contrast tonight she will need to be at [facility] @ 800 am to drink contrast

## 2011-11-17 NOTE — Telephone Encounter (Signed)
Per NUR add a CBC/D. Lab noted and faxed to [facility]

## 2011-11-17 NOTE — Telephone Encounter (Signed)
Patient complains of constant right upper quadrant pain radiating towards right breast. Pain does not seem to get better or worse with food. She denies fever chills nausea vomiting or diarrhea. She had ERCP with a recent sphincterotomy in November 2012 complicated by a micro-perforation responding to medical therapy. Her AST ALT prior to that intervention at above 100 and now AST is 28 and ALT is 39 Plan. CBC and LFTs. Abdominal pelvic CT with contrast. Hydrocodone 500 one by mouth every 6 when necessary 30 without refill.

## 2011-11-17 NOTE — Telephone Encounter (Signed)
Per Dr. Karilyn Cota repeat LFT on 11-18-11. Lab noted and faxed to [facility]

## 2011-11-18 ENCOUNTER — Ambulatory Visit (HOSPITAL_COMMUNITY)
Admission: RE | Admit: 2011-11-18 | Discharge: 2011-11-18 | Disposition: A | Discharge status: Home / Self Care | Payer: Self-pay | Source: Ambulatory Visit | Attending: Internal Medicine | Admitting: Internal Medicine

## 2011-11-18 ENCOUNTER — Other Ambulatory Visit (INDEPENDENT_AMBULATORY_CARE_PROVIDER_SITE_OTHER): Payer: Self-pay | Admitting: Internal Medicine

## 2011-11-18 ENCOUNTER — Other Ambulatory Visit (HOSPITAL_COMMUNITY): Payer: Self-pay | Admitting: Internal Medicine

## 2011-11-18 DIAGNOSIS — R1011 Right upper quadrant pain: Secondary | ICD-10-CM | POA: Insufficient documentation

## 2011-11-18 MED ORDER — IOHEXOL 300 MG/ML  SOLN
100.0000 mL | Freq: Once | INTRAMUSCULAR | Status: AC | PRN
  Administered 2011-11-18: 100 mL via INTRAVENOUS

## 2011-11-19 LAB — HEPATIC FUNCTION PANEL
AST: 27 U/L (ref 0–37)
Albumin: 4.1 g/dL (ref 3.5–5.2)
Total Bilirubin: 0.6 mg/dL (ref 0.3–1.2)

## 2011-11-21 ENCOUNTER — Other Ambulatory Visit (INDEPENDENT_AMBULATORY_CARE_PROVIDER_SITE_OTHER): Payer: Self-pay | Admitting: *Deleted

## 2011-11-21 DIAGNOSIS — R1011 Right upper quadrant pain: Secondary | ICD-10-CM

## 2011-11-24 MED ORDER — SODIUM CHLORIDE 0.45 % IV SOLN
Freq: Once | INTRAVENOUS | Status: AC
  Administered 2011-11-25: 08:00:00 via INTRAVENOUS

## 2011-11-25 ENCOUNTER — Encounter (HOSPITAL_COMMUNITY): Payer: Self-pay

## 2011-11-25 ENCOUNTER — Other Ambulatory Visit (INDEPENDENT_AMBULATORY_CARE_PROVIDER_SITE_OTHER): Payer: Self-pay | Admitting: Internal Medicine

## 2011-11-25 ENCOUNTER — Ambulatory Visit (HOSPITAL_COMMUNITY)
Admission: RE | Admit: 2011-11-25 | Discharge: 2011-11-25 | Disposition: A | Discharge status: Home / Self Care | Payer: Self-pay | Source: Ambulatory Visit | Attending: Internal Medicine | Admitting: Internal Medicine

## 2011-11-25 ENCOUNTER — Encounter (HOSPITAL_COMMUNITY)
Admission: RE | Disposition: A | Discharge status: Home / Self Care | Payer: Self-pay | Source: Ambulatory Visit | Attending: Internal Medicine

## 2011-11-25 DIAGNOSIS — D131 Benign neoplasm of stomach: Secondary | ICD-10-CM | POA: Insufficient documentation

## 2011-11-25 DIAGNOSIS — R1011 Right upper quadrant pain: Secondary | ICD-10-CM | POA: Insufficient documentation

## 2011-11-25 DIAGNOSIS — R1013 Epigastric pain: Secondary | ICD-10-CM | POA: Insufficient documentation

## 2011-11-25 DIAGNOSIS — K259 Gastric ulcer, unspecified as acute or chronic, without hemorrhage or perforation: Secondary | ICD-10-CM | POA: Insufficient documentation

## 2011-11-25 DIAGNOSIS — K319 Disease of stomach and duodenum, unspecified: Secondary | ICD-10-CM

## 2011-11-25 HISTORY — PX: ESOPHAGOGASTRODUODENOSCOPY: SHX5428

## 2011-11-25 SURGERY — EGD (ESOPHAGOGASTRODUODENOSCOPY)
Anesthesia: Moderate Sedation

## 2011-11-25 MED ORDER — MIDAZOLAM HCL 5 MG/5ML IJ SOLN
INTRAMUSCULAR | Status: AC
  Filled 2011-11-25: qty 10

## 2011-11-25 MED ORDER — MEPERIDINE HCL 25 MG/ML IJ SOLN
INTRAMUSCULAR | Status: DC | PRN
  Administered 2011-11-25: 25 mg via INTRAVENOUS

## 2011-11-25 MED ORDER — BUTAMBEN-TETRACAINE-BENZOCAINE 2-2-14 % EX AERO
INHALATION_SPRAY | CUTANEOUS | Status: DC | PRN
  Administered 2011-11-25: 2 via TOPICAL

## 2011-11-25 MED ORDER — STERILE WATER FOR IRRIGATION IR SOLN
Status: DC | PRN
  Administered 2011-11-25: 08:00:00

## 2011-11-25 MED ORDER — MIDAZOLAM HCL 5 MG/5ML IJ SOLN
INTRAMUSCULAR | Status: DC | PRN
  Administered 2011-11-25 (×4): 2 mg via INTRAVENOUS

## 2011-11-25 MED ORDER — PANTOPRAZOLE SODIUM 40 MG PO TBEC: 40.0000 mg | DELAYED_RELEASE_TABLET | Freq: Two times a day (BID) | ORAL | Status: DC

## 2011-11-25 MED ORDER — MEPERIDINE HCL 50 MG/ML IJ SOLN
INTRAMUSCULAR | Status: AC
  Filled 2011-11-25: qty 1

## 2011-11-25 NOTE — Discharge Instructions (Signed)
Continue antireflux measures. Increase pantoprazole to 40 mg about 30 minutes before breakfast and 30 minutes before evening meal. No driving for 24 hours. Physician will contact her with biopsy resultsGastroesophageal Reflux Disease, Adult Gastroesophageal reflux disease (GERD) happens when acid from your stomach flows up into the esophagus. When acid comes in contact with the esophagus, the acid causes soreness (inflammation) in the esophagus. Over time, GERD may create small holes (ulcers) in the lining of the esophagus. CAUSES   Increased body weight. This puts pressure on the stomach, making acid rise from the stomach into the esophagus.   Smoking. This increases acid production in the stomach.   Drinking alcohol. This causes decreased pressure in the lower esophageal sphincter (valve or ring of muscle between the esophagus and stomach), allowing acid from the stomach into the esophagus.   Late evening meals and a full stomach. This increases pressure and acid production in the stomach.   A malformed lower esophageal sphincter.  Sometimes, no cause is found. SYMPTOMS   Burning pain in the lower part of the mid-chest behind the breastbone and in the mid-stomach area. This may occur twice a week or more often.   Trouble swallowing.   Sore throat.   Dry cough.   Asthma-like symptoms including chest tightness, shortness of breath, or wheezing.  DIAGNOSIS  Your caregiver may be able to diagnose GERD based on your symptoms. In some cases, X-rays and other tests may be done to check for complications or to check the condition of your stomach and esophagus. TREATMENT  Your caregiver may recommend over-the-counter or prescription medicines to help decrease acid production. Ask your caregiver before starting or adding any new medicines.  HOME CARE INSTRUCTIONS   Change the factors that you can control. Ask your caregiver for guidance concerning weight loss, quitting smoking, and alcohol  consumption.   Avoid foods and drinks that make your symptoms worse, such as:   Caffeine or alcoholic drinks.   Chocolate.   Peppermint or mint flavorings.   Garlic and onions.   Spicy foods.   Citrus fruits, such as oranges, lemons, or limes.   Tomato-based foods such as sauce, chili, salsa, and pizza.   Fried and fatty foods.   Avoid lying down for the 3 hours prior to your bedtime or prior to taking a nap.   Eat small, frequent meals instead of large meals.   Wear loose-fitting clothing. Do not wear anything tight around your waist that causes pressure on your stomach.   Raise the head of your bed 6 to 8 inches with wood blocks to help you sleep. Extra pillows will not help.   Only take over-the-counter or prescription medicines for pain, discomfort, or fever as directed by your caregiver.   Do not take aspirin, ibuprofen, or other nonsteroidal anti-inflammatory drugs (NSAIDs).  SEEK IMMEDIATE MEDICAL CARE IF:   You have pain in your arms, neck, jaw, teeth, or back.   Your pain increases or changes in intensity or duration.   You develop nausea, vomiting, or sweating (diaphoresis).   You develop shortness of breath, or you faint.   Your vomit is green, yellow, black, or looks like coffee grounds or blood.   Your stool is red, bloody, or black.  These symptoms could be signs of other problems, such as heart disease, gastric bleeding, or esophageal bleeding. MAKE SURE YOU:   Understand these instructions.   Will watch your condition.   Will get help right away if you are not doing  well or get worse.  Document Released: 07/06/2005 Document Revised: 06/08/2011 Document Reviewed: 04/15/2011 [facility] Patient Information 2012 [place], [place].

## 2011-11-25 NOTE — H&P (Signed)
Mallory [name] is an 56 y.o. female.   Chief Complaint: Patient is here for esophagogastroduodenoscopy. HPI: Patient is a 56 year old Caucasian female presents with recurrent epigastric and right upper quadrant pain. Pain may get better or worse with meals. It is not associated with nausea or vomiting. She also denies melena or rectal bleeding. Her appetite is good and she has not lost any weight. She has not experience heartburn since she's been on pantoprazole but has not helped the pain. She was initially evaluated in October 2012 are more or less similar pain and noted to have bump in her transaminases. She was felt to have sphincter of OD dysfunction. She underwent ERCP with debris sphincterotomy complicated by microperforation of duodenum treated medically. She had abdominopelvic CT recently which is unremarkable. Her LFTs and CBC with differential have been normal. She reports no improvement in dicyclomine.  Past Medical History  Diagnosis Date  . Pulmonary embolism     5 yrs ago (unknown region)  . GERD (gastroesophageal reflux disease)     Past Surgical History  Procedure Date  . Cesarean section 1991    [facility]  . Cholecystectomy 15 yrs ago    [facility]  . Ercp w/ sphicterotomy 08/12/11    Dr Mads Borgmeyer-?microlithiasis, SOD  . Ercp 08/12/2011    Procedure: ENDOSCOPIC RETROGRADE CHOLANGIOPANCREATOGRAPHY (ERCP);  Surgeon: Malissa Hippo, MD;  Location: [facility];  Service: Endoscopy;  Laterality: N/A;  . Sphincterotomy 08/12/2011    Procedure: SPHINCTEROTOMY;  Surgeon: Malissa Hippo, MD;  Location: [facility];  Service: Endoscopy;;  with ballon passage    Family History  Problem Relation Age of Onset  . Diabetes Mother   . Heart failure Father   . Diabetes Brother   . Anesthesia problems Neg Hx   . Hypotension Neg Hx   . Malignant hyperthermia Neg Hx   . Pseudochol deficiency Neg Hx    Social History:  reports that she has never smoked. She does not have any smokeless tobacco history on file.  She reports that she does not drink alcohol or use illicit drugs.  Allergies:  Allergies  Allergen Reactions  . Phenergan Hives  . Hydromorphone Itching    Medications Prior to Admission  Medication Dose Route Frequency Provider Last Rate Last Dose  . 0.45 % sodium chloride infusion   Intravenous Once Malissa Hippo, MD 20 mL/hr at 11/25/11 0754    . meperidine (DEMEROL) 50 MG/ML injection           . midazolam (VERSED) 5 MG/5ML injection            Medications Prior to Admission  Medication Sig Dispense Refill  . dicyclomine (BENTYL) 10 MG capsule Take 1 capsule (10 mg total) by mouth 2 (two) times daily.  60 capsule  2  . HYDROcodone-acetaminophen (VICODIN) 5-500 MG per tablet Take 1 tablet by mouth every 6 (six) hours as needed for pain.  30 tablet  0  . pantoprazole (PROTONIX) 40 MG tablet Take 1 tablet (40 mg total) by mouth daily.  30 tablet  5    No results found for this or any previous visit (from the past 48 hour(s)). No results found.  Review of Systems  Constitutional: Negative for weight loss.  Gastrointestinal: Negative for heartburn, nausea, vomiting, diarrhea, blood in stool and melena. Constipation: occasional.    Blood pressure 126/82, pulse 78, temperature 98.1 F (36.7 C), temperature source Oral, resp. rate 16, height 5\' 3"  (1.6 m), weight 177 lb (80.287 kg),  last menstrual period 04/09/2011, SpO2 97.00%. Physical Exam  Constitutional: She appears well-developed and well-nourished.  HENT:  Mouth/Throat: Oropharynx is clear and moist.  Eyes: Conjunctivae are normal. No scleral icterus.  Neck: No thyromegaly present.  Cardiovascular: Normal rate, regular rhythm and normal heart sounds.   No murmur heard. Respiratory: Effort normal and breath sounds normal.  GI: Soft. She exhibits no mass. There is Tenderness: mild tenderness at epigastrium and right upper quadrant..  Musculoskeletal: She exhibits no edema.  Lymphadenopathy:    She has no cervical  adenopathy.  Neurological: She is alert.  Skin: Skin is warm and dry.     Assessment/Plan Recurrent epigastric/right upper quadrant pain. Diagnostic esophagogastroduodenoscopy.  Mont Jagoda U 11/25/2011, 8:15 AM

## 2011-11-25 NOTE — Op Note (Signed)
EGD PROCEDURE REPORT  PATIENT:  Mallory [name]  MR#:  [id number] Birthdate:  [birth year], 56 y.o., female Endoscopist:  Dr. Malissa Hippo, MD Referred By:  Dr. [name]. [name], M.D. Procedure Date: 11/25/2011  Procedure:   EGD  Indications:  Patient is 56 year old Caucasian female with a recurrent epigastric and right upper quadrant pain. Please refer to history and physical for details of her illness. She is undergoing diagnostic EGD.  Informed consent ; Risks, benefits, alternatives & imponderables which include, but are not limited to, bleeding, infection, perforation, drug reaction and potential missed lesion have been reviewed.  The potential for biopsy, lesion removal, esophageal dilation, etc. have also been discussed.  Questions have been answered.  All parties agreeable.  Please see history & physical in medical record for more information.  Medications:  Demerol  50 mg IV Versed  8 mg IV Cetacaine spray topically for oropharyngeal anesthesia  Description of procedure:  The endoscope was introduced through the mouth and advanced to the second portion of the duodenum without difficulty or limitations. The mucosal surfaces were surveyed very carefully during advancement of the scope and upon withdrawal.  Findings:  Esophagus:   mucosa of the esophagus was normal. The baby GE junction but no evidence of ring or stricture noted.  GEJ:  35 cm Stomach:   other than small amount of bile the stomach was empty. It distended very well with insufflation. Folds in the proximal stomach are normal. Examination mucosa reveals 3 mm hyperplastic-appearing polyp in the gastric body which was left alone. Antral mucosa reveals nodularity. Therefore biopsy taken for routine histology. Pyloric channel was patent. Angularis fundus and cardia were examined by retroflexion of scope and were normal. Duodenum:  bulbar and post bulbar mucosa was normal. Ampulla of Vater was well seen. It appeared normal.  Pictures taken for the record.  Therapeutic/Diagnostic Maneuvers Performed:   Biopsies taken from antral nodules.  Complications:  none  Impression: 3 mm hyperplastic-appearing polyp and gastric body. It was left alone. Three  Small antral nodules. Biopsy taken for routine histology . No evidence of peptic ulcer disease.   Recommendations:  Discontinue dicyclomine. Increase pantoprazole to 40 mg by mouth twice a day. I will contact patient with results of biopsy and further recommendations.   [name]  11/25/2011  8:48 AM  CC: Dr. Cassell Smiles., MD, MD & Dr. Bonnetta Barry ref. provider found

## 2011-11-28 ENCOUNTER — Ambulatory Visit (INDEPENDENT_AMBULATORY_CARE_PROVIDER_SITE_OTHER): Payer: Self-pay | Admitting: Internal Medicine

## 2011-11-30 ENCOUNTER — Encounter (INDEPENDENT_AMBULATORY_CARE_PROVIDER_SITE_OTHER): Payer: Self-pay | Admitting: *Deleted

## 2011-12-01 ENCOUNTER — Encounter (HOSPITAL_COMMUNITY): Payer: Self-pay | Admitting: Internal Medicine

## 2011-12-06 ENCOUNTER — Other Ambulatory Visit: Payer: Self-pay

## 2011-12-06 ENCOUNTER — Telehealth: Payer: Self-pay

## 2011-12-06 DIAGNOSIS — R1013 Epigastric pain: Secondary | ICD-10-CM

## 2011-12-06 NOTE — Telephone Encounter (Signed)
Pt has been scheduled and meds reviewed.  She will call with any questions or concerns

## 2012-01-18 ENCOUNTER — Other Ambulatory Visit (HOSPITAL_COMMUNITY): Payer: Self-pay | Admitting: Family Medicine

## 2012-01-18 ENCOUNTER — Ambulatory Visit (HOSPITAL_COMMUNITY)
Admission: RE | Admit: 2012-01-18 | Discharge: 2012-01-18 | Disposition: A | Discharge status: Home / Self Care | Payer: No Typology Code available for payment source | Source: Ambulatory Visit | Attending: Family Medicine | Admitting: Family Medicine

## 2012-01-18 DIAGNOSIS — R319 Hematuria, unspecified: Secondary | ICD-10-CM | POA: Insufficient documentation

## 2012-01-18 DIAGNOSIS — R1032 Left lower quadrant pain: Secondary | ICD-10-CM | POA: Insufficient documentation

## 2012-01-18 DIAGNOSIS — R932 Abnormal findings on diagnostic imaging of liver and biliary tract: Secondary | ICD-10-CM | POA: Insufficient documentation

## 2012-01-18 DIAGNOSIS — M545 Low back pain, unspecified: Secondary | ICD-10-CM

## 2012-01-19 ENCOUNTER — Ambulatory Visit (HOSPITAL_COMMUNITY)
Admission: RE | Admit: 2012-01-19 | Discharge: 2012-01-19 | Disposition: A | Discharge status: Home Health | Payer: No Typology Code available for payment source | Source: Ambulatory Visit | Attending: Gastroenterology | Admitting: Gastroenterology

## 2012-01-19 ENCOUNTER — Encounter (HOSPITAL_COMMUNITY)
Admission: RE | Disposition: A | Discharge status: Home Health | Payer: Self-pay | Source: Ambulatory Visit | Attending: Gastroenterology

## 2012-01-19 ENCOUNTER — Encounter (HOSPITAL_COMMUNITY): Payer: Self-pay | Admitting: Gastroenterology

## 2012-01-19 ENCOUNTER — Encounter (HOSPITAL_COMMUNITY): Payer: Self-pay | Admitting: Anesthesiology

## 2012-01-19 ENCOUNTER — Ambulatory Visit (HOSPITAL_COMMUNITY): Payer: No Typology Code available for payment source | Admitting: Anesthesiology

## 2012-01-19 DIAGNOSIS — Z86711 Personal history of pulmonary embolism: Secondary | ICD-10-CM | POA: Insufficient documentation

## 2012-01-19 DIAGNOSIS — R109 Unspecified abdominal pain: Secondary | ICD-10-CM | POA: Insufficient documentation

## 2012-01-19 DIAGNOSIS — K219 Gastro-esophageal reflux disease without esophagitis: Secondary | ICD-10-CM | POA: Insufficient documentation

## 2012-01-19 DIAGNOSIS — Z9089 Acquired absence of other organs: Secondary | ICD-10-CM | POA: Insufficient documentation

## 2012-01-19 DIAGNOSIS — R1013 Epigastric pain: Secondary | ICD-10-CM

## 2012-01-19 HISTORY — PX: EUS: SHX5427

## 2012-01-19 SURGERY — UPPER ENDOSCOPIC ULTRASOUND (EUS) LINEAR
Anesthesia: Monitor Anesthesia Care

## 2012-01-19 MED ORDER — FENTANYL CITRATE 0.05 MG/ML IJ SOLN
INTRAMUSCULAR | Status: DC | PRN
  Administered 2012-01-19: 100 ug via INTRAVENOUS

## 2012-01-19 MED ORDER — PROPOFOL 10 MG/ML IV EMUL
INTRAVENOUS | Status: DC | PRN
  Administered 2012-01-19: 160 ug/kg/min via INTRAVENOUS

## 2012-01-19 MED ORDER — LACTATED RINGERS IV SOLN
INTRAVENOUS | Status: DC
  Administered 2012-01-19: 1000 mL via INTRAVENOUS

## 2012-01-19 MED ORDER — BUTAMBEN-TETRACAINE-BENZOCAINE 2-2-14 % EX AERO
INHALATION_SPRAY | CUTANEOUS | Status: DC | PRN
  Administered 2012-01-19: 2 via TOPICAL

## 2012-01-19 NOTE — H&P (Signed)
  HPI: This is a woman with unexplained ruq/epig pain; multiple imaging, endoscopic exams (including ercp with sphicnterotomy)    Past Medical History  Diagnosis Date  . Pulmonary embolism     5 yrs ago (unknown region)  . GERD (gastroesophageal reflux disease)     Past Surgical History  Procedure Date  . Cesarean section 1991    [facility]  . Cholecystectomy 15 yrs ago    [facility]  . Ercp w/ sphicterotomy 08/12/11    Dr Rehman-?microlithiasis, SOD  . Ercp 08/12/2011    Procedure: ENDOSCOPIC RETROGRADE CHOLANGIOPANCREATOGRAPHY (ERCP);  Surgeon: Malissa Hippo, MD;  Location: [facility];  Service: Endoscopy;  Laterality: N/A;  . Sphincterotomy 08/12/2011    Procedure: SPHINCTEROTOMY;  Surgeon: Malissa Hippo, MD;  Location: [facility];  Service: Endoscopy;;  with ballon passage  . Esophagogastroduodenoscopy 11/25/2011    Procedure: ESOPHAGOGASTRODUODENOSCOPY (EGD);  Surgeon: Malissa Hippo, MD;  [facility];  Service: Endoscopy;  Laterality: N/A;  830    Current Facility-Administered Medications  Medication Dose Route Frequency Provider Last Rate Last Dose  . lactated ringers infusion   Intravenous Continuous Rachael Fee, MD 100 mL/hr at 01/19/12 0911 1,000 mL at 01/19/12 0911    Allergies as of 12/06/2011 - Review Complete 11/25/2011  Allergen Reaction Noted  . Phenergan Hives 08/14/2011  . Hydromorphone Itching 08/08/2011    Family History  Problem Relation Age of Onset  . Diabetes Mother   . Heart failure Father   . Diabetes Brother   . Anesthesia problems Neg Hx   . Hypotension Neg Hx   . Malignant hyperthermia Neg Hx   . Pseudochol deficiency Neg Hx     History   Social History  . Marital Status: Divorced    Spouse Name: N/A    Number of Children: 2  . Years of Education: N/A   Occupational History  . [employer]    Social History Main Topics  . Smoking status: Never Smoker   . Smokeless tobacco: Not on file  . Alcohol Use: No  . Drug Use: No  . Sexually  Active: Yes    Birth Control/ Protection: Post-menopausal   Other Topics Concern  . Not on file   Social History Narrative   Lives w/ 78 yr old daughter.  2 daughters      Physical Exam: BP 123/71  Pulse 52  Temp(Src) 98.1 F (36.7 C) (Oral)  Resp 19  Ht 5\' 3"  (1.6 m)  Wt 171 lb (77.565 kg)  BMI 30.29 kg/m2  SpO2 98%  LMP 04/09/2011 Constitutional: generally well-appearing Psychiatric: alert and oriented x3 Abdomen: soft, nontender, nondistended, no obvious ascites, no peritoneal signs, normal bowel sounds     Assessment and plan: 56 y.o. female with ruq/epig pain  Upper eus today

## 2012-01-19 NOTE — Transfer of Care (Signed)
Immediate Anesthesia Transfer of Care Note  Patient: Mallory [name]  Procedure(s) Performed: Procedure(s) (LRB): UPPER ENDOSCOPIC ULTRASOUND (EUS) LINEAR (N/A)  Patient Location: PACU  Anesthesia Type: MAC  Level of Consciousness: awake, alert , oriented and patient cooperative  Airway & Oxygen Therapy: Patient Spontanous Breathing and Patient connected to nasal cannula oxygen  Post-op Assessment: Report given to PACU RN and Post -op Vital signs reviewed and stable  Post vital signs: Reviewed and stable  Complications: No apparent anesthesia complications

## 2012-01-19 NOTE — Anesthesia Preprocedure Evaluation (Signed)
Anesthesia Evaluation  Patient identified by MRN, date of birth, ID band Patient awake    Reviewed: Allergy & Precautions, H&P , NPO status , Patient's Chart, lab work & pertinent test results, reviewed documented beta blocker date and time   Airway Mallampati: II TM Distance: >3 FB Neck ROM: Full    Dental  (+) Upper Dentures and Dental Advisory Given   Pulmonary neg pulmonary ROS,  breath sounds clear to auscultation        Cardiovascular negative cardio ROS  Rhythm:Regular Rate:Normal  Denies cardiac symptoms   Neuro/Psych negative neurological ROS  negative psych ROS   GI/Hepatic Neg liver ROS, Epigastric pain   Endo/Other  negative endocrine ROS  Renal/GU ?hematuria  negative genitourinary   Musculoskeletal negative musculoskeletal ROS (+)   Abdominal   Peds negative pediatric ROS (+)  Hematology negative hematology ROS (+)   Anesthesia Other Findings   Reproductive/Obstetrics negative OB ROS                           Anesthesia Physical Anesthesia Plan  ASA: I  Anesthesia Plan: MAC   Post-op Pain Management:    Induction: Intravenous  Airway Management Planned: Mask  Additional Equipment:   Intra-op Plan:   Post-operative Plan:   Informed Consent: I have reviewed the patients History and Physical, chart, labs and discussed the procedure including the risks, benefits and alternatives for the proposed anesthesia with the patient or authorized representative who has indicated his/her understanding and acceptance.   Dental advisory given  Plan Discussed with: CRNA and Surgeon  Anesthesia Plan Comments:         Anesthesia Quick Evaluation

## 2012-01-19 NOTE — Discharge Instructions (Signed)

## 2012-01-19 NOTE — Op Note (Signed)
[facility] [address] [place], [place]  [address]  ENDOSCOPIC ULTRASOUND PROCEDURE REPORT  PATIENT:  Mallory, [name]  MR#:  [id number] BIRTHDATE:  May 27, 1956  GENDER:  female ENDOSCOPIST:  Rachael Fee, MD REFERRED BY:  Lionel December, M.D. PROCEDURE DATE:  01/19/2012 PROCEDURE:  Upper EUS ASA CLASS:  Class II INDICATIONS:  persistent intermittent RUQ, epigastric pain; s/p mutliple tests, lap chole, ERCP with sphincterotomy (Dr. Karilyn Cota) MEDICATIONS:   MAC sedation, administered by CRNA  DESCRIPTION OF PROCEDURE:   After the risks benefits and alternatives of the procedure were  explained, informed consent was obtained. The patient was then placed in the left, lateral, decubitus postion and IV sedation was administered. Throughout the procedure, the patient's blood pressure, pulse and oxygen saturations were monitored continuously.  Under direct visualization, the Pentax Linear [id number] endoscope was introduced through the mouth and advanced to the second portion of the duodenum.  Water was used as necessary to provide an acoustic interface.  Upon completion of the imaging, water was removed and the patient was sent to the recovery room in satisfactory condition. <<PROCEDUREIMAGES>>  Endoscopic findings (with radial echoendoscope): 1. Normal UGI tract  EUS findings: 1. Pancreatic parenchyma was normal throughout; no discrete masses or changes of chronic pancreatitis. 2. Main pancreatic duct was normal 3. CBD contained air bubbles but no clear shadowing stones or debris 4. No peripancreatic adenopathy 5. Gallbladder surgically absent 6. Limited views of liver, spleen were all normal  Impression: Normal (post cholecystectomy, post biliary sphincterotomy examination.  No clear cause of her intermittent epigastric/RUQ pains  ______________________________ Rachael Fee, MD  n. eSIGNED:   Rachael Fee at 01/19/2012 12:02 PM  Jiles Prows,  [id number]

## 2012-01-19 NOTE — Anesthesia Postprocedure Evaluation (Signed)
  Anesthesia Post-op Note  Patient: Mallory [name]  Procedure(s) Performed: Procedure(s) (LRB): UPPER ENDOSCOPIC ULTRASOUND (EUS) LINEAR (N/A)  Patient Location: PACU  Anesthesia Type: MAC  Level of Consciousness: oriented and sedated  Airway and Oxygen Therapy: Patient Spontanous Breathing  Post-op Pain: mild  Post-op Assessment: Post-op Vital signs reviewed, Patient's Cardiovascular Status Stable, Respiratory Function Stable and Patent Airway  Post-op Vital Signs: stable  Complications: No apparent anesthesia complications

## 2012-01-20 ENCOUNTER — Encounter (HOSPITAL_COMMUNITY): Payer: Self-pay | Admitting: Gastroenterology

## 2012-02-20 ENCOUNTER — Encounter (INDEPENDENT_AMBULATORY_CARE_PROVIDER_SITE_OTHER): Payer: Self-pay

## 2012-03-19 ENCOUNTER — Other Ambulatory Visit (HOSPITAL_COMMUNITY): Payer: Self-pay | Admitting: Urology

## 2012-03-19 DIAGNOSIS — N390 Urinary tract infection, site not specified: Secondary | ICD-10-CM

## 2012-03-20 ENCOUNTER — Ambulatory Visit (HOSPITAL_COMMUNITY)
Admission: RE | Admit: 2012-03-20 | Discharge: 2012-03-20 | Disposition: A | Discharge status: Home / Self Care | Payer: PRIVATE HEALTH INSURANCE | Source: Ambulatory Visit | Attending: Urology | Admitting: Urology

## 2012-03-20 DIAGNOSIS — N390 Urinary tract infection, site not specified: Secondary | ICD-10-CM | POA: Insufficient documentation

## 2012-03-28 ENCOUNTER — Encounter: Payer: Self-pay | Admitting: Gastroenterology

## 2012-04-20 ENCOUNTER — Telehealth: Payer: Self-pay | Admitting: Gastroenterology

## 2012-04-20 NOTE — Telephone Encounter (Signed)
completed

## 2012-04-26 ENCOUNTER — Encounter (INDEPENDENT_AMBULATORY_CARE_PROVIDER_SITE_OTHER): Payer: Self-pay | Admitting: Internal Medicine

## 2012-04-26 ENCOUNTER — Telehealth (INDEPENDENT_AMBULATORY_CARE_PROVIDER_SITE_OTHER): Payer: Self-pay | Admitting: *Deleted

## 2012-04-26 ENCOUNTER — Other Ambulatory Visit (INDEPENDENT_AMBULATORY_CARE_PROVIDER_SITE_OTHER): Payer: Self-pay | Admitting: *Deleted

## 2012-04-26 ENCOUNTER — Ambulatory Visit (INDEPENDENT_AMBULATORY_CARE_PROVIDER_SITE_OTHER): Payer: PRIVATE HEALTH INSURANCE | Admitting: Internal Medicine

## 2012-04-26 VITALS — BP 100/64 | HR 72 | Temp 98.1°F | Ht 63.0 in | Wt 165.0 lb

## 2012-04-26 DIAGNOSIS — Z1211 Encounter for screening for malignant neoplasm of colon: Secondary | ICD-10-CM

## 2012-04-26 DIAGNOSIS — R1033 Periumbilical pain: Secondary | ICD-10-CM

## 2012-04-26 DIAGNOSIS — R109 Unspecified abdominal pain: Secondary | ICD-10-CM

## 2012-04-26 DIAGNOSIS — R52 Pain, unspecified: Secondary | ICD-10-CM

## 2012-04-26 DIAGNOSIS — K589 Irritable bowel syndrome without diarrhea: Secondary | ICD-10-CM

## 2012-04-26 MED ORDER — HYOSCYAMINE SULFATE 0.125 MG SL SUBL: 0.1250 mg | SUBLINGUAL_TABLET | SUBLINGUAL | Status: DC | PRN

## 2012-04-26 NOTE — Patient Instructions (Signed)
Levsin 0.125mg  sl q 4 hrs sl as needed. Screening colonoscopy

## 2012-04-26 NOTE — Progress Notes (Signed)
Subjective:     Patient ID: Mallory [name], female   DOB: May 04, 1956, 56 y.o.   MRN: [id number]  HPI Babe is a 56 yr old female presenting today with c/o periumblical pain. She says she has this pain at least 2-3 times a day. Symptoms for about a year.  The pain will last a minute or two. She had an abnormal pap smear and is being re-evaluated a [facility] in [place]. She has undergone 3 EGD since the first of the year. She is having a BM about once a day. Stools are normal size. Occasionally see blood which she thinks is hemorrhoidal. The pain she has is not related to her bowel movements. The pain is related to food intake. Her last colonoscopy was over 20 yrs ago.  Appetite is good.  She has lost 15 pounds intentional.  No acid reflux. Controlled with Protonix.   Review of Systems see hpi Current Outpatient Prescriptions  Medication Sig Dispense Refill  . pantoprazole (PROTONIX) 40 MG tablet Take 1 tablet (40 mg total) by mouth 2 (two) times daily.  60 tablet  5  . HYDROcodone-acetaminophen (VICODIN) 5-500 MG per tablet Take 1 tablet by mouth every 6 (six) hours as needed for pain.  30 tablet  0   Past Medical History  Diagnosis Date  . Pulmonary embolism     5 yrs ago (unknown region)  . GERD (gastroesophageal reflux disease)    Past Surgical History  Procedure Date  . Cesarean section 1991    [facility]  . Cholecystectomy 15 yrs ago    [facility]  . Ercp w/ sphicterotomy 08/12/11    Dr Rehman-?microlithiasis, SOD  . Ercp 08/12/2011    Procedure: ENDOSCOPIC RETROGRADE CHOLANGIOPANCREATOGRAPHY (ERCP);  Surgeon: Malissa Hippo, MD;  Location: [facility];  Service: Endoscopy;  Laterality: N/A;  . Sphincterotomy 08/12/2011    Procedure: SPHINCTEROTOMY;  Surgeon: Malissa Hippo, MD;  Location: [facility];  Service: Endoscopy;;  with ballon passage  . Esophagogastroduodenoscopy 11/25/2011    Procedure: ESOPHAGOGASTRODUODENOSCOPY (EGD);  Surgeon: Malissa Hippo, MD;  [facility];  Service:  Endoscopy;  Laterality: N/A;  830  . Eus 01/19/2012    Procedure: UPPER ENDOSCOPIC ULTRASOUND (EUS) LINEAR;  Surgeon: Rachael Fee, MD;  Location: [facility];  Service: Endoscopy;  Laterality: N/A;  pt moved up a hour early by AW , Patty @ [place] office ok'd / pt was called by AW   Ms. Delton See does not currently have medications on file. Family Status  Relation Status Death Age  . Mother Alive   . Father Deceased     CHF  . Brother Alive   . Sister Alive     with a hx of an MI  . Child Alive     one has tumors of her adrenal glands.  . Child Deceased     Pancreas-phochromocytoma-ex-husband's side   History   Social History  . Marital Status: Divorced    Spouse Name: N/A    Number of Children: 2  . Years of Education: N/A   Occupational History  . [employer]    Social History Main Topics  . Smoking status: Never Smoker   . Smokeless tobacco: Not on file  . Alcohol Use: No  . Drug Use: No  . Sexually Active: Yes    Birth Control/ Protection: Post-menopausal   Other Topics Concern  . Not on file   Social History Narrative   Lives w/ 46 yr old daughter.  2 daughters   Allergies  Allergen Reactions  . Promethazine Hcl Hives  . Hydromorphone Itching  . Nitrofurantoin Monohyd Macro         Objective:   Physical Exam   Filed Vitals:   04/26/12 1508  Height: 5\' 3"  (1.6 m)  Weight: 165 lb (74.844 kg)   Alert and oriented. Skin warm and dry. Oral mucosa is moist.   . Sclera anicteric, conjunctivae is pink. Thyroid not enlarged. No cervical lymphadenopathy. Lungs clear. Heart regular rate and rhythm.  Abdomen is soft. Bowel sounds are positive. No hepatomegaly. No abdominal masses felt. No tenderness.  No edema to lower extremities.       Assessment:   Periumblical pain possible IBS. Screening colonoscopy.    Plan:    Levsin 0l25mg  sl q 4 hrs as needed. Colonoscopy.  The risks and benefits such as perforation, bleeding, and infection were reviewed with the patient  and is agreeable. PR in 2 weeks.

## 2012-04-26 NOTE — Telephone Encounter (Signed)
Patient needs movi prep 

## 2012-04-27 MED ORDER — PEG-KCL-NACL-NASULF-NA ASC-C 100 G PO SOLR: 1.0000 | Freq: Once | ORAL | Status: DC

## 2012-04-30 ENCOUNTER — Ambulatory Visit: Payer: PRIVATE HEALTH INSURANCE | Admitting: Gastroenterology

## 2012-05-01 ENCOUNTER — Telehealth (INDEPENDENT_AMBULATORY_CARE_PROVIDER_SITE_OTHER): Payer: Self-pay | Admitting: *Deleted

## 2012-05-01 DIAGNOSIS — Z1211 Encounter for screening for malignant neoplasm of colon: Secondary | ICD-10-CM

## 2012-05-01 NOTE — Telephone Encounter (Signed)
Patient needs trilyte, movi prep was too expensive 

## 2012-05-02 MED ORDER — PEG 3350-KCL-NABCB-NACL-NASULF 236 G PO SOLR: 4.0000 L | Freq: Once | ORAL | Status: AC

## 2012-05-16 ENCOUNTER — Encounter (HOSPITAL_COMMUNITY): Payer: Self-pay | Admitting: Pharmacy Technician

## 2012-05-23 MED ORDER — SODIUM CHLORIDE 0.45 % IV SOLN
Freq: Once | INTRAVENOUS | Status: AC
  Administered 2012-05-24: 14:00:00 via INTRAVENOUS

## 2012-05-24 ENCOUNTER — Encounter (HOSPITAL_COMMUNITY): Payer: Self-pay | Admitting: *Deleted

## 2012-05-24 ENCOUNTER — Ambulatory Visit (HOSPITAL_COMMUNITY)
Admission: RE | Admit: 2012-05-24 | Discharge: 2012-05-24 | Disposition: A | Discharge status: Home / Self Care | Payer: PRIVATE HEALTH INSURANCE | Source: Ambulatory Visit | Attending: Internal Medicine | Admitting: Internal Medicine

## 2012-05-24 ENCOUNTER — Encounter (HOSPITAL_COMMUNITY)
Admission: RE | Disposition: A | Discharge status: Home / Self Care | Payer: Self-pay | Source: Ambulatory Visit | Attending: Internal Medicine

## 2012-05-24 DIAGNOSIS — K6389 Other specified diseases of intestine: Secondary | ICD-10-CM | POA: Insufficient documentation

## 2012-05-24 DIAGNOSIS — Z1211 Encounter for screening for malignant neoplasm of colon: Secondary | ICD-10-CM

## 2012-05-24 DIAGNOSIS — R1033 Periumbilical pain: Secondary | ICD-10-CM

## 2012-05-24 HISTORY — PX: COLONOSCOPY: SHX5424

## 2012-05-24 SURGERY — COLONOSCOPY
Anesthesia: Moderate Sedation

## 2012-05-24 MED ORDER — MIDAZOLAM HCL 5 MG/5ML IJ SOLN
INTRAMUSCULAR | Status: AC
  Filled 2012-05-24: qty 10

## 2012-05-24 MED ORDER — MIDAZOLAM HCL 5 MG/5ML IJ SOLN
INTRAMUSCULAR | Status: DC | PRN
  Administered 2012-05-24 (×2): 2 mg via INTRAVENOUS
  Administered 2012-05-24 (×2): 1 mg via INTRAVENOUS
  Administered 2012-05-24: 2 mg via INTRAVENOUS

## 2012-05-24 MED ORDER — STERILE WATER FOR IRRIGATION IR SOLN
Status: DC | PRN
  Administered 2012-05-24: 14:00:00

## 2012-05-24 MED ORDER — MEPERIDINE HCL 50 MG/ML IJ SOLN
INTRAMUSCULAR | Status: DC | PRN
  Administered 2012-05-24 (×2): 25 mg via INTRAVENOUS

## 2012-05-24 MED ORDER — MEPERIDINE HCL 50 MG/ML IJ SOLN
INTRAMUSCULAR | Status: AC
  Filled 2012-05-24: qty 1

## 2012-05-24 NOTE — H&P (Signed)
Mallory [name] is an 56 y.o. female.   Chief Complaint: Patient is here for colonoscopy. HPI: Patient is 56 year old Caucasian female who is in for average risk screening colonoscopy. She denies diarrhea or rectal bleeding. She complains of intermittent epigastric pain which lasts no more than few seconds or a minute and not always associated with meals. In fact she has noted more pain since she's been on liquids. EGD in February 2013 was negative for peptic ulcer disease. Family history is negative for colorectal carcinoma.  Past Medical History  Diagnosis Date  . Pulmonary embolism     5 yrs ago (unknown region)  . GERD (gastroesophageal reflux disease)     Past Surgical History  Procedure Date  . Cesarean section 1991    mc  . Cholecystectomy 15 yrs ago    [facility]  . Ercp w/ sphicterotomy 08/12/11    Dr [name]-?microlithiasis, SOD  . Ercp 08/12/2011    Procedure: ENDOSCOPIC RETROGRADE CHOLANGIOPANCREATOGRAPHY (ERCP);  Surgeon: Malissa Hippo, MD;  Location: [facility];  Service: Endoscopy;  Laterality: N/A;  . Sphincterotomy 08/12/2011    Procedure: SPHINCTEROTOMY;  Surgeon: Malissa Hippo, MD;  Location: [facility];  Service: Endoscopy;;  with ballon passage  . Esophagogastroduodenoscopy 11/25/2011    Procedure: ESOPHAGOGASTRODUODENOSCOPY (EGD);  Surgeon: Malissa Hippo, MD;  [facility];  Service: Endoscopy;  Laterality: N/A;  830  . Eus 01/19/2012    Procedure: UPPER ENDOSCOPIC ULTRASOUND (EUS) LINEAR;  Surgeon: Rachael Fee, MD;  Location[facility];  Service: Endoscopy;  Laterality: N/A;  pt moved up a hour early by AW , Patty @ [place] office ok'd / pt was called by AW    Family History  Problem Relation Age of Onset  . Diabetes Mother   . Heart failure Father   . Diabetes Brother   . Anesthesia problems Neg Hx   . Hypotension Neg Hx   . Malignant hyperthermia Neg Hx   . Pseudochol deficiency Neg Hx    Social History:  reports that she has never smoked. She does  not have any smokeless tobacco history on file. She reports that she does not drink alcohol or use illicit drugs.  Allergies:  Allergies  Allergen Reactions  . Promethazine Hcl Hives  . Hydromorphone Itching  . Nitrofurantoin Monohyd Macro Itching    Medications Prior to Admission  Medication Sig Dispense Refill  . aspirin EC 81 MG tablet Take 81 mg by mouth daily as needed. Pain      . ibuprofen (ADVIL,MOTRIN) 200 MG tablet Take 400 mg by mouth every 6 (six) hours as needed. Pain      . pantoprazole (PROTONIX) 40 MG tablet Take 1 tablet (40 mg total) by mouth 2 (two) times daily.  60 tablet  5  . peg 3350 powder (MOVIPREP) 100 G SOLR Take 1 kit (100 g total) by mouth once.  1 kit  0    No results found for this or any previous visit (from the past 48 hour(s)). No results found.  ROS  Blood pressure 120/77, pulse 62, temperature 97.8 F (36.6 C), temperature source Oral, resp. rate 16, last menstrual period 04/09/2011, SpO2 96.00%. Physical Exam  Constitutional: She appears well-developed and well-nourished.  HENT:  Mouth/Throat: Oropharynx is clear and moist.  Eyes: Conjunctivae are normal. No scleral icterus.  Neck: No thyromegaly present.  Cardiovascular: Normal rate, regular rhythm and normal heart sounds.   No murmur heard. Respiratory: Effort normal and breath sounds normal.  GI:  Soft. She exhibits no distension and no mass. There is no tenderness.  Musculoskeletal: She exhibits no edema.  Lymphadenopathy:    She has no cervical adenopathy.  Neurological: She is alert.  Skin: Skin is warm and dry.     Assessment/Plan Average risk screening colonoscopy.  [name],Mallory U 05/24/2012, 2:04 PM

## 2012-05-24 NOTE — Op Note (Signed)
COLONOSCOPY PROCEDURE REPORT  PATIENT:  Mallory [name]  MR#:  [id number] Birthdate:  Feb 13, 1956, 56 y.o., female Endoscopist:  Dr. Malissa Hippo, MD Procedure Date: 05/24/2012  Procedure:   Colonoscopy  Indications:  Patient is 56 year old Caucasian female was undergoing average risk screening colonoscopy. She does complain of intermittent epigastric pain felt to be secondary to IBS or dyspepsia. Prior EGD negative for peptic ulcer disease.  Informed Consent:  The procedure and risks were reviewed with the patient and informed consent was obtained.  Medications:  Demerol 50 mg IV Versed 8 mg IV  Description of procedure:  After a digital rectal exam was performed, that colonoscope was advanced from the anus through the rectum and colon to the area of the cecum, ileocecal valve and appendiceal orifice. The cecum was deeply intubated. These structures were well-seen and photographed for the record. From the level of the cecum and ileocecal valve, the scope was slowly and cautiously withdrawn. The mucosal surfaces were carefully surveyed utilizing scope tip to flexion to facilitate fold flattening as needed. The scope was pulled down into the rectum where a thorough exam including retroflexion was performed.  Findings:   Prep excellent. Fine mucosal pigmentation sigmoid and descending colon and system with melanosis. No evidence of colonic polyps or other abnormalities. Normal rectal mucosa and anorectal junction.  Therapeutic/Diagnostic Maneuvers Performed:  None  Complications:  None  Cecal Withdrawal Time:  9 minutes  Impression:  Examination performed to cecum. Mild changes of melanosis coli involving distal half of the colon otherwise normal colonoscopy.  Recommendations:  Standard instructions given. Patient advised to keep symptom diary regarding her abdominal pain until office visit in 3 months.  REHMAN,NAJEEB U  05/24/2012 2:50 PM  CC: Dr. Mady Gemma, PA & Dr. Bonnetta Barry  ref. provider found

## 2012-05-29 ENCOUNTER — Encounter (HOSPITAL_COMMUNITY): Payer: Self-pay | Admitting: Internal Medicine

## 2012-08-23 ENCOUNTER — Encounter (INDEPENDENT_AMBULATORY_CARE_PROVIDER_SITE_OTHER): Payer: Self-pay | Admitting: Internal Medicine

## 2012-08-23 ENCOUNTER — Ambulatory Visit (INDEPENDENT_AMBULATORY_CARE_PROVIDER_SITE_OTHER): Payer: PRIVATE HEALTH INSURANCE | Admitting: Internal Medicine

## 2012-08-23 VITALS — BP 122/78 | HR 68 | Temp 97.9°F | Ht 63.0 in | Wt 171.8 lb

## 2012-08-23 DIAGNOSIS — K219 Gastro-esophageal reflux disease without esophagitis: Secondary | ICD-10-CM

## 2012-08-23 DIAGNOSIS — K589 Irritable bowel syndrome without diarrhea: Secondary | ICD-10-CM

## 2012-08-23 NOTE — Progress Notes (Signed)
Subjective:     Patient ID: Mallory [name], female   DOB: 1956-05-11, 56 y.o.   MRN: [id number]  HPI  Here today for f/u. States she feels better since starting the Levsin.She denies any epigastric pain. Appetite is good. No weight loss. BMs are normal.  She is not exercising any. No rectal bleeding or melena. Acid reflux is controlled with protonix. She has a rectangular area to her abdomen the size of a mailing label on her abdomen. Area has a white border and is not raised. Noticed after tanning. Colonoscopy 05/2012 Findings:  Prep excellent.  Fine mucosal pigmentation sigmoid and descending colon and system with melanosis.  No evidence of colonic polyps or other abnormalities.  Normal rectal mucosa and anorectal junction.  Review of Systems Current Outpatient Prescriptions  Medication Sig Dispense Refill  . aspirin EC 81 MG tablet Take 81 mg by mouth daily as needed. Pain      . hyoscyamine (LEVSIN, ANASPAZ) 0.125 MG tablet Take 0.125 mg by mouth every 4 (four) hours as needed.      Marland Kitchen ibuprofen (ADVIL,MOTRIN) 200 MG tablet Take 400 mg by mouth every 6 (six) hours as needed. Pain      . pantoprazole (PROTONIX) 40 MG tablet Take 1 tablet (40 mg total) by mouth 2 (two) times daily.  60 tablet  5   Past Medical History  Diagnosis Date  . Pulmonary embolism     5 yrs ago (unknown region)  . GERD (gastroesophageal reflux disease)    Allergies  Allergen Reactions  . Promethazine Hcl Hives  . Hydromorphone Itching  . Nitrofurantoin Monohyd Macro Itching   Past Surgical History  Procedure Date  . Cesarean section 1991    [facility]  . Cholecystectomy 15 yrs ago    [facility]  . Ercp w/ sphicterotomy 08/12/11    Dr Rehman-?microlithiasis, SOD  . Ercp 08/12/2011    Procedure: ENDOSCOPIC RETROGRADE CHOLANGIOPANCREATOGRAPHY (ERCP);  Surgeon: Malissa Hippo, MD;  Location: [facility];  Service: Endoscopy;  Laterality: N/A;  . Sphincterotomy 08/12/2011    Procedure: SPHINCTEROTOMY;  Surgeon: Malissa Hippo, MD;  Location: [facility];  Service: Endoscopy;;  with ballon passage  . Esophagogastroduodenoscopy 11/25/2011    Procedure: ESOPHAGOGASTRODUODENOSCOPY (EGD);  Surgeon: Malissa Hippo, MD;  [facility]: [facility];  Service: Endoscopy;  Laterality: N/A;  830  . Eus 01/19/2012    Procedure: UPPER ENDOSCOPIC ULTRASOUND (EUS) LINEAR;  Surgeon: Rachael Fee, MD;  Location: [facility];  Service: Endoscopy;  Laterality: N/A;  pt moved up a hour early by AW , Patty @ [place] office ok'd / pt was called by AW  . Colonoscopy 05/24/2012    Procedure: COLONOSCOPY;  Surgeon: Malissa Hippo, MD;  [facility]: [facility];  Service: Endoscopy;  Laterality: N/A;  220        Objective:   Physical Exam Filed Vitals:   08/23/12 1531  BP: 122/78  Pulse: 68  Temp: 97.9 F (36.6 C)  Height: 5\' 3"  (1.6 m)  Weight: 171 lb 12.8 oz (77.928 kg)   Alert and oriented. Skin warm and dry. Oral mucosa is moist.   . Sclera anicteric, conjunctivae is pink. Thyroid not enlarged. No cervical lymphadenopathy. Lungs clear. Heart regular rate and rhythm.  Abdomen is soft. Bowel sounds are positive. No hepatomegaly. No abdominal masses felt. No tenderness.  No edema to lower extremities.  Area to abdomen.Rectangular in appearance. White border. Area is not raised.  Smooth.     Assessment:  Gerd controlled at this time.  IBS controlled with Levsin. She had a normal colonoscopy.  BMs are normal.  Abnormal discoloration to area of abdomen which I believe is benign.     Plan:    OV 1 yr. Sooner if any problem. If a rash develops she will call or come by office to let me see it.

## 2012-08-23 NOTE — Patient Instructions (Addendum)
Continue present medications. OV in 1 yr. 

## 2013-01-07 ENCOUNTER — Other Ambulatory Visit (INDEPENDENT_AMBULATORY_CARE_PROVIDER_SITE_OTHER): Payer: Self-pay | Admitting: Internal Medicine

## 2013-01-26 ENCOUNTER — Emergency Department (HOSPITAL_COMMUNITY): Payer: Self-pay

## 2013-01-26 ENCOUNTER — Encounter (HOSPITAL_COMMUNITY): Payer: Self-pay

## 2013-01-26 ENCOUNTER — Emergency Department (HOSPITAL_COMMUNITY)
Admission: EM | Admit: 2013-01-26 | Discharge: 2013-01-26 | Disposition: A | Discharge status: Home / Self Care | Payer: Self-pay | Attending: Emergency Medicine | Admitting: Emergency Medicine

## 2013-01-26 DIAGNOSIS — M25561 Pain in right knee: Secondary | ICD-10-CM

## 2013-01-26 DIAGNOSIS — K219 Gastro-esophageal reflux disease without esophagitis: Secondary | ICD-10-CM | POA: Insufficient documentation

## 2013-01-26 DIAGNOSIS — Z86711 Personal history of pulmonary embolism: Secondary | ICD-10-CM | POA: Insufficient documentation

## 2013-01-26 DIAGNOSIS — M25569 Pain in unspecified knee: Secondary | ICD-10-CM | POA: Insufficient documentation

## 2013-01-26 MED ORDER — ENOXAPARIN SODIUM 120 MG/0.8ML ~~LOC~~ SOLN
1.5000 mg/kg | Freq: Once | SUBCUTANEOUS | Status: AC
  Administered 2013-01-26: 125 mg via SUBCUTANEOUS
  Filled 2013-01-26: qty 0.8

## 2013-01-26 NOTE — ED Notes (Signed)
Pt reports right knee pain and swelling since yesterday, ext is warm to touch and hard to bend, h/o PE, denies any sob.

## 2013-01-26 NOTE — ED Notes (Signed)
Patient with no complaints at this time. Respirations even and unlabored. Skin warm/dry. Discharge instructions reviewed with patient at this time. Patient given opportunity to voice concerns/ask questions. Patient discharged at this time and left Emergency Department with steady gait.   

## 2013-01-26 NOTE — ED Provider Notes (Signed)
History     CSN: [id number]  Arrival date & time 01/26/13  1422   First MD Initiated Contact with Patient 01/26/13 1533      Chief Complaint  Patient presents with  . Knee Pain    (Consider location/radiation/quality/duration/timing/severity/associated sxs/prior treatment) Patient is a 57 y.o. female presenting with knee pain. The history is provided by the patient (the pt complain of right knee pain). No language interpreter was used.  Knee Pain Lower extremity pain location: right knee. Pain details:    Quality:  Aching   Radiates to:  Does not radiate   Severity:  Moderate   Onset quality:  Gradual Associated symptoms: no back pain and no fatigue     Past Medical History  Diagnosis Date  . Pulmonary embolism     5 yrs ago (unknown region)  . GERD (gastroesophageal reflux disease)     Past Surgical History  Procedure Laterality Date  . Cesarean section  1991    [facility]  . Cholecystectomy  15 yrs ago    [facility]  . Ercp w/ sphicterotomy  08/12/11    Dr Rehman-?microlithiasis, SOD  . Ercp  08/12/2011    Procedure: ENDOSCOPIC RETROGRADE CHOLANGIOPANCREATOGRAPHY (ERCP);  Surgeon: Malissa Hippo, MD;  Location: [facility];  Service: Endoscopy;  Laterality: N/A;  . Sphincterotomy  08/12/2011    Procedure: SPHINCTEROTOMY;  Surgeon: Malissa Hippo, MD;  Location: [facility];  Service: Endoscopy;;  with ballon passage  . Esophagogastroduodenoscopy  11/25/2011    Procedure: ESOPHAGOGASTRODUODENOSCOPY (EGD);  Surgeon: Malissa Hippo, MD;  [facility];  Service: Endoscopy;  Laterality: N/A;  830  . Eus  01/19/2012    Procedure: UPPER ENDOSCOPIC ULTRASOUND (EUS) LINEAR;  Surgeon: Rachael Fee, MD;  Location: [facility];  Service: Endoscopy;  Laterality: N/A;  pt moved up a hour early by AW , Patty @ [place] office ok'd / pt was called by AW  . Colonoscopy  05/24/2012    Procedure: COLONOSCOPY;  Surgeon: Malissa Hippo, MD;  [facility];  Service: Endoscopy;  Laterality:  N/A;  220    Family History  Problem Relation Age of Onset  . Diabetes Mother   . Heart failure Father   . Diabetes Brother   . Anesthesia problems Neg Hx   . Hypotension Neg Hx   . Malignant hyperthermia Neg Hx   . Pseudochol deficiency Neg Hx     History  Substance Use Topics  . Smoking status: Never Smoker   . Smokeless tobacco: Not on file  . Alcohol Use: No    OB History   Grav Para Term Preterm Abortions TAB SAB Ect Mult Living                  Review of Systems  Constitutional: Negative for appetite change and fatigue.  HENT: Negative for congestion, sinus pressure and ear discharge.   Eyes: Negative for discharge.  Respiratory: Negative for cough.   Cardiovascular: Negative for chest pain.  Gastrointestinal: Negative for abdominal pain and diarrhea.  Genitourinary: Negative for frequency and hematuria.  Musculoskeletal: Negative for back pain.       Pain right knee  Skin: Negative for rash.  Neurological: Negative for seizures and headaches.  Psychiatric/Behavioral: Negative for hallucinations.    Allergies  Promethazine hcl; Hydromorphone; and Nitrofurantoin monohyd macro  Home Medications   Current Outpatient Rx  Name  Route  Sig  Dispense  Refill  . hyoscyamine (LEVSIN, ANASPAZ) 0.125 MG tablet  Oral   Take 0.125 mg by mouth daily as needed for cramping.          Marland Kitchen ibuprofen (ADVIL,MOTRIN) 200 MG tablet   Oral   Take 200-400 mg by mouth daily as needed for pain. Pain         . pantoprazole (PROTONIX) 40 MG tablet   Oral   Take 40 mg by mouth daily as needed (for heartburn).         . Phenyleph-Diphenhyd-GG-APAP (MUCINEX SINUS-MAX DAY/NIGHT PO)   Oral   Take 1 tablet by mouth once as needed (FOR RELIEF OF SYMPTOMS).           BP 130/75  Pulse 60  Temp(Src) 97.7 F (36.5 C) (Oral)  Resp 18  Ht 5\' 3"  (1.6 m)  Wt 180 lb (81.647 kg)  BMI 31.89 kg/m2  SpO2 100%  LMP 04/09/2011  Physical Exam  Constitutional: She is oriented  to person, place, and time. She appears well-developed.  HENT:  Head: Normocephalic.  Eyes: Conjunctivae are normal.  Neck: No tracheal deviation present.  Cardiovascular:  No murmur heard. Musculoskeletal: Normal range of motion.  Pt has swelling and tenderness to distal left thigh  Neurological: She is oriented to person, place, and time.  Skin: Skin is warm.  Psychiatric: She has a normal mood and affect.    ED Course  Procedures (including critical care time)  Labs Reviewed - No data to display Dg Knee Complete 4 Views Right  01/26/2013  *RADIOLOGY REPORT*  Clinical Data: Knee pain.  RIGHT KNEE - COMPLETE 4+ VIEW  Comparison: None.  Findings: Small joint effusion.  Mild degenerative spurring in the medial compartment and patellofemoral compartment.  No acute bony abnormality.  Specifically, no fracture, subluxation, or dislocation.  Soft tissues are intact.  IMPRESSION: Mild degenerative changes. No acute bony abnormality.   Original Report Authenticated By: Charlett Nose, M.D.      1. Right knee pain       MDM  DVt vs muscle inflamed        Benny Lennert, MD 01/26/13 [phone]

## 2013-01-27 ENCOUNTER — Other Ambulatory Visit (HOSPITAL_COMMUNITY): Payer: Self-pay | Admitting: Emergency Medicine

## 2013-01-27 ENCOUNTER — Ambulatory Visit (HOSPITAL_COMMUNITY)
Admit: 2013-01-27 | Discharge: 2013-01-27 | Disposition: A | Discharge status: Home / Self Care | Payer: Self-pay | Source: Ambulatory Visit | Attending: Emergency Medicine | Admitting: Emergency Medicine

## 2013-01-27 DIAGNOSIS — M25469 Effusion, unspecified knee: Secondary | ICD-10-CM | POA: Insufficient documentation

## 2013-01-27 DIAGNOSIS — M25561 Pain in right knee: Secondary | ICD-10-CM

## 2013-01-27 DIAGNOSIS — M25569 Pain in unspecified knee: Secondary | ICD-10-CM | POA: Insufficient documentation

## 2013-04-15 IMAGING — CR DG ABDOMEN ACUTE W/ 1V CHEST
3 series · 3 of 3 positions shown · non-contrast
Comparison: None

CLINICAL DATA: Abdominal pain.  Recent ERCP.  Next field none

ACUTE ABDOMEN SERIES (ABDOMEN 2 VIEW & CHEST 1 VIEW)

[view not recorded (1 of 3)]
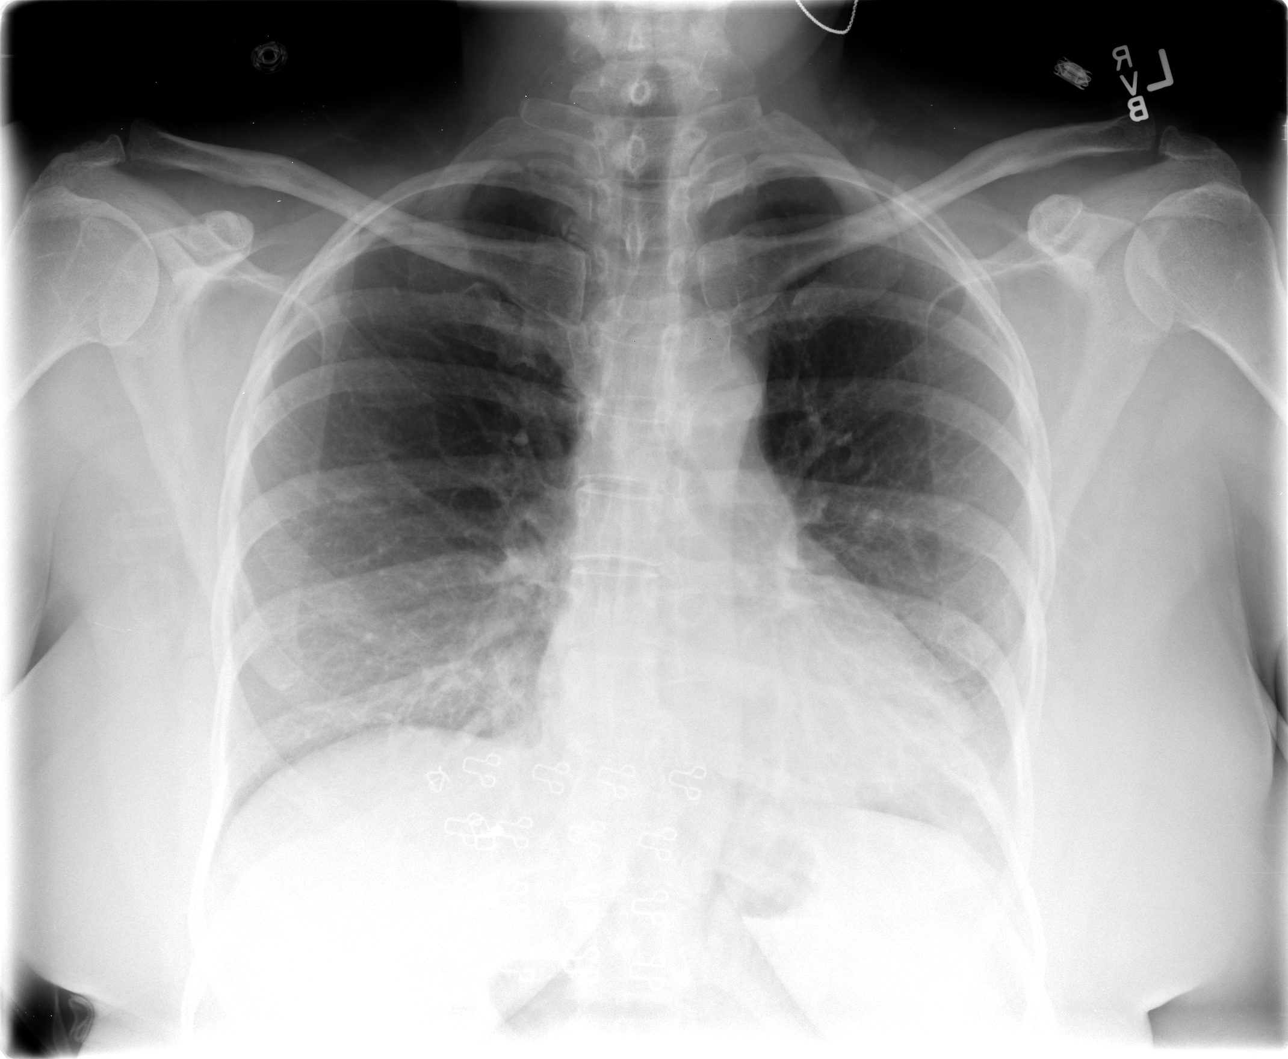

[view not recorded (2 of 3)]
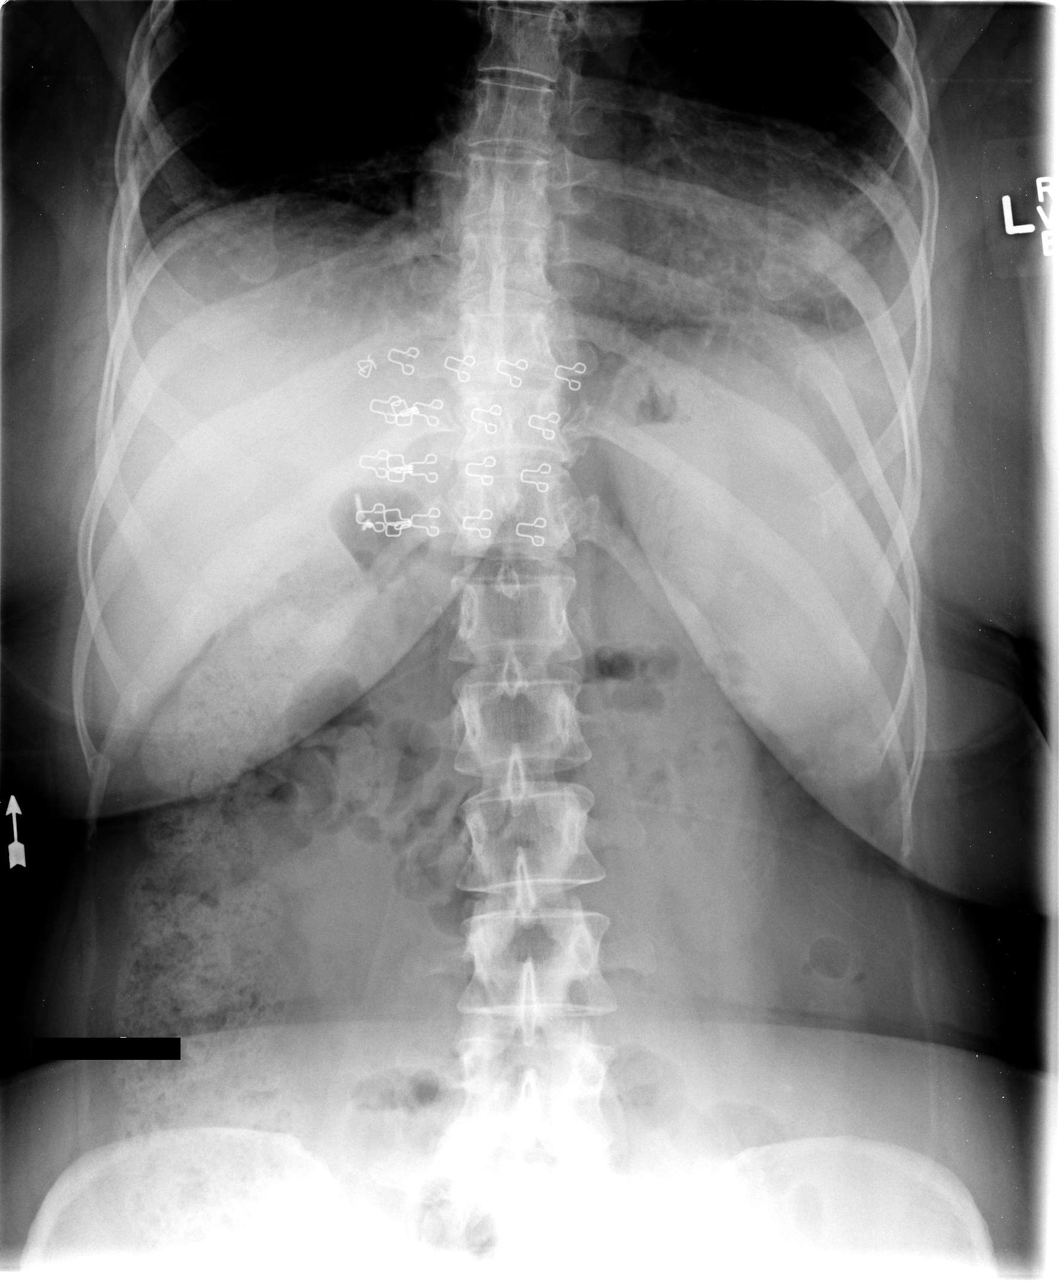

[view not recorded (3 of 3)]
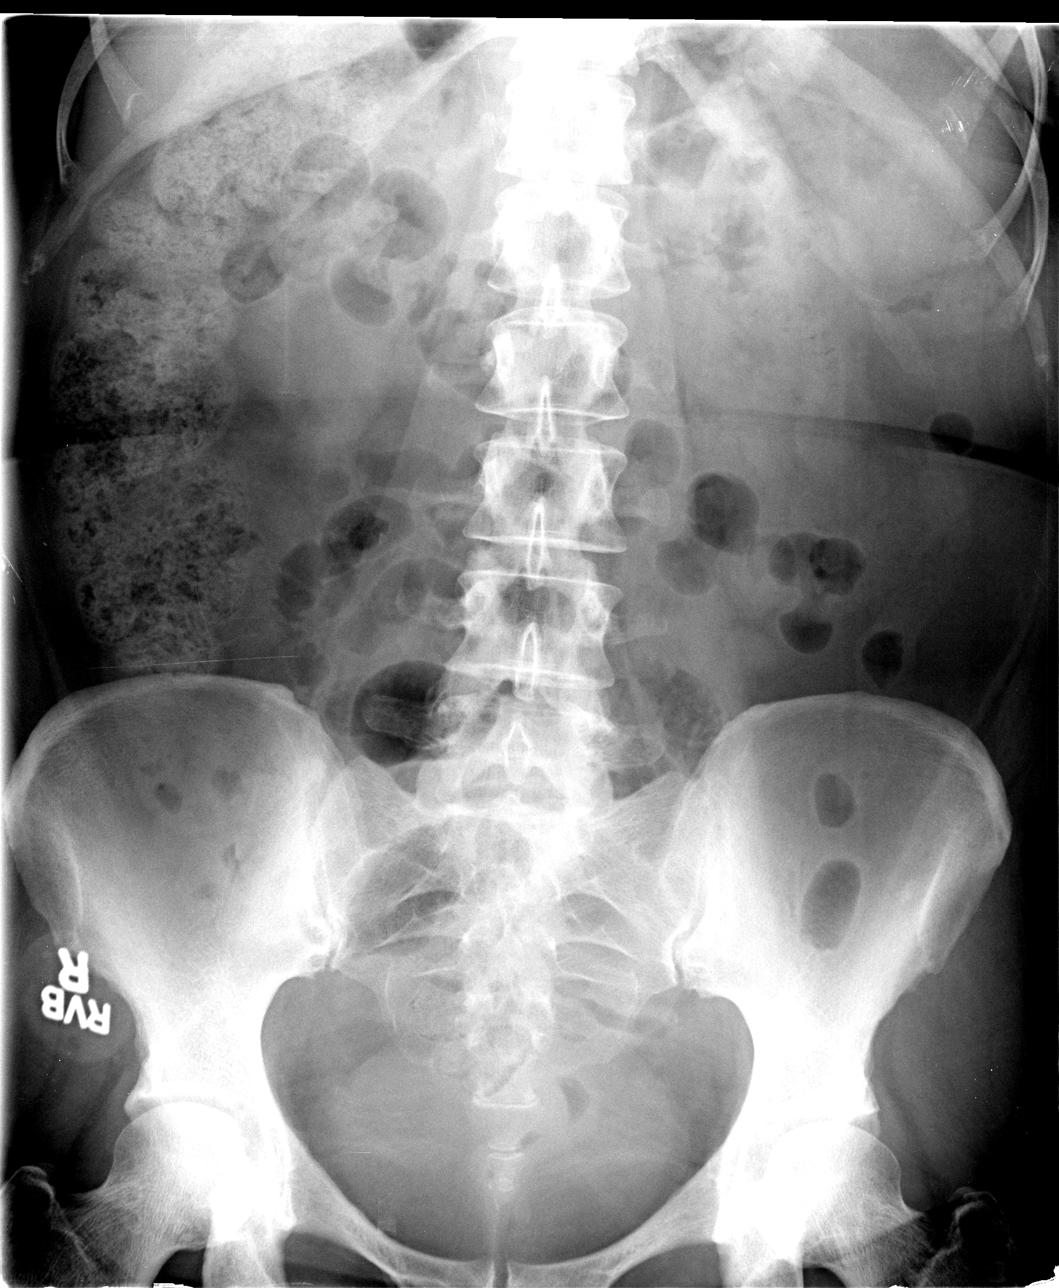

[3 of 3 positions shown; findings below may reference images not displayed]

FINDINGS: Bowel gas pattern does not show evidence of ileus,
obstruction or free air.  There is a moderate amount of fecal
matter in the right colon.  No worrisome calcifications or bony
findings.

One-view chest shows normal heart and mediastinal shadows.  There
are chronic interstitial lung markings at the bases.  No free air.
IMPRESSION: No acute finding by plain radiography.  Moderate amount of fecal
matter in the right colon.

## 2013-06-15 ENCOUNTER — Encounter (HOSPITAL_COMMUNITY): Payer: Self-pay | Admitting: Emergency Medicine

## 2013-06-15 ENCOUNTER — Emergency Department (HOSPITAL_COMMUNITY)
Admission: EM | Admit: 2013-06-15 | Discharge: 2013-06-15 | Disposition: A | Discharge status: Home / Self Care | Payer: Managed Care, Other (non HMO) | Attending: Emergency Medicine | Admitting: Emergency Medicine

## 2013-06-15 ENCOUNTER — Emergency Department (HOSPITAL_COMMUNITY): Payer: Managed Care, Other (non HMO)

## 2013-06-15 DIAGNOSIS — Z9089 Acquired absence of other organs: Secondary | ICD-10-CM | POA: Insufficient documentation

## 2013-06-15 DIAGNOSIS — Z79899 Other long term (current) drug therapy: Secondary | ICD-10-CM | POA: Insufficient documentation

## 2013-06-15 DIAGNOSIS — Z86711 Personal history of pulmonary embolism: Secondary | ICD-10-CM | POA: Insufficient documentation

## 2013-06-15 DIAGNOSIS — R109 Unspecified abdominal pain: Secondary | ICD-10-CM | POA: Insufficient documentation

## 2013-06-15 DIAGNOSIS — R112 Nausea with vomiting, unspecified: Secondary | ICD-10-CM | POA: Insufficient documentation

## 2013-06-15 DIAGNOSIS — R0789 Other chest pain: Secondary | ICD-10-CM

## 2013-06-15 DIAGNOSIS — K219 Gastro-esophageal reflux disease without esophagitis: Secondary | ICD-10-CM | POA: Insufficient documentation

## 2013-06-15 DIAGNOSIS — M549 Dorsalgia, unspecified: Secondary | ICD-10-CM | POA: Insufficient documentation

## 2013-06-15 LAB — URINALYSIS, ROUTINE W REFLEX MICROSCOPIC
Bilirubin Urine: NEGATIVE
Hgb urine dipstick: NEGATIVE
Nitrite: NEGATIVE
Protein, ur: NEGATIVE mg/dL
Specific Gravity, Urine: 1.007 (ref 1.005–1.030)
Urobilinogen, UA: 0.2 mg/dL (ref 0.0–1.0)

## 2013-06-15 LAB — BASIC METABOLIC PANEL
BUN: 10 mg/dL (ref 6–23)
GFR calc Af Amer: >90 mL/min (ref >90)
GFR calc non Af Amer: >90 mL/min (ref >90)
Potassium: 3.8 mEq/L (ref 3.5–5.1)

## 2013-06-15 LAB — HEPATIC FUNCTION PANEL
Albumin: 3.7 g/dL (ref 3.5–5.2)
Alkaline Phosphatase: 111 U/L (ref 39–117)
Indirect Bilirubin: 0.4 mg/dL (ref 0.3–0.9)
Total Bilirubin: 0.5 mg/dL (ref 0.3–1.2)

## 2013-06-15 LAB — CBC
HCT: 47.5 % — ABNORMAL HIGH (ref 36.0–46.0)
MCHC: 36.2 g/dL — ABNORMAL HIGH (ref 30.0–36.0)
RDW: 13.3 % (ref 11.5–15.5)

## 2013-06-15 LAB — POCT I-STAT TROPONIN I: Troponin i, poc: 0 ng/mL (ref 0.00–0.08)

## 2013-06-15 MED ORDER — MORPHINE SULFATE 4 MG/ML IJ SOLN
4.0000 mg | Freq: Once | INTRAMUSCULAR | Status: AC
  Administered 2013-06-15: 4 mg via INTRAVENOUS
  Filled 2013-06-15: qty 1

## 2013-06-15 MED ORDER — MORPHINE SULFATE 4 MG/ML IJ SOLN
4.0000 mg | INTRAMUSCULAR | Status: DC | PRN
  Administered 2013-06-15: 4 mg via INTRAVENOUS
  Filled 2013-06-15: qty 1

## 2013-06-15 MED ORDER — ALUMINUM & MAGNESIUM HYDROXIDE 200-200 MG/5ML PO SUSP: 10.0000 mL | Freq: Four times a day (QID) | ORAL | Status: DC | PRN

## 2013-06-15 MED ORDER — GI COCKTAIL ~~LOC~~
30.0000 mL | Freq: Once | ORAL | Status: AC
  Administered 2013-06-15: 30 mL via ORAL
  Filled 2013-06-15: qty 30

## 2013-06-15 MED ORDER — IOHEXOL 350 MG/ML SOLN
80.0000 mL | Freq: Once | INTRAVENOUS | Status: AC | PRN
  Administered 2013-06-15: 80 mL via INTRAVENOUS

## 2013-06-15 MED ORDER — HYDROCODONE-ACETAMINOPHEN 5-325 MG PO TABS: 1.0000 | ORAL_TABLET | ORAL | Status: DC | PRN

## 2013-06-15 MED ORDER — NITROGLYCERIN 0.4 MG SL SUBL: 0.4000 mg | SUBLINGUAL_TABLET | SUBLINGUAL | Status: DC | PRN

## 2013-06-15 MED ORDER — DIPHENHYDRAMINE HCL 50 MG/ML IJ SOLN
25.0000 mg | Freq: Once | INTRAMUSCULAR | Status: AC
  Administered 2013-06-15: 25 mg via INTRAVENOUS
  Filled 2013-06-15: qty 1

## 2013-06-15 NOTE — ED Provider Notes (Signed)
Medical screening examination/treatment/procedure(s) were conducted as a shared visit with non-physician practitioner(s) and myself.  I personally evaluated the patient during the encounter  Pt with gerd type x 3 days--no anginal type sx--ecg and troponin without acs--chest ct pending due to h/o pe  Toy Baker, MD 06/15/13 1547

## 2013-06-15 NOTE — ED Notes (Signed)
PA at bedside.

## 2013-06-15 NOTE — ED Provider Notes (Signed)
CSN: [id number]     Arrival date & time 06/15/13  1232 History   First MD Initiated Contact with Patient 06/15/13 1328     Chief Complaint  Patient presents with  . Chest Pain   (Consider location/radiation/quality/duration/timing/severity/associated sxs/prior Treatment) HPI Comments: Patient is a 57 yo F PMHx significant for PE, GERD presenting to the ED for waxing and waning midsternal chest burning w/ radiation to back since Thursday. Patient states today the pain increased and began to radiate to her right shoulder and neck. She rates her pain currently 5/10 with no alleviating or aggravating factors. She endorses she had a few episodes of emesis over the last few days. Patient states she was concerned because this presentation is the exact same to her previous PE. Denies fevers, chills, SOB from baselined, unilateral leg swelling. She does endorse recent long travel. Patient is not on any anticoagulation currently.   Patient is a 57 y.o. female presenting with chest pain.  Chest Pain Associated symptoms: abdominal pain, back pain and nausea   Associated symptoms: no fever     Past Medical History  Diagnosis Date  . Pulmonary embolism     5 yrs ago (unknown region)  . GERD (gastroesophageal reflux disease)    Past Surgical History  Procedure Laterality Date  . Cesarean section  1991    [facility]  . Cholecystectomy  15 yrs ago    [facility]  . Ercp w/ sphicterotomy  08/12/11    Dr Rehman-?microlithiasis, SOD  . Ercp  08/12/2011    Procedure: ENDOSCOPIC RETROGRADE CHOLANGIOPANCREATOGRAPHY (ERCP);  Surgeon: Malissa Hippo, MD;  Location: [facility];  Service: Endoscopy;  Laterality: N/A;  . Sphincterotomy  08/12/2011    Procedure: SPHINCTEROTOMY;  Surgeon: Malissa Hippo, MD;  Location: [facility];  Service: Endoscopy;;  with ballon passage  . Esophagogastroduodenoscopy  11/25/2011    Procedure: ESOPHAGOGASTRODUODENOSCOPY (EGD);  Surgeon: Malissa Hippo, MD;  [facility];  Service: Endoscopy;   Laterality: N/A;  830  . Eus  01/19/2012    Procedure: UPPER ENDOSCOPIC ULTRASOUND (EUS) LINEAR;  Surgeon: Rachael Fee, MD;  Location: [facility];  Service: Endoscopy;  Laterality: N/A;  pt moved up a hour early by AW , Patty @ [place] office ok'd / pt was called by AW  . Colonoscopy  05/24/2012    Procedure: COLONOSCOPY;  Surgeon: Malissa Hippo, MD;  [facility];  Service: Endoscopy;  Laterality: N/A;  220   Family History  Problem Relation Age of Onset  . Diabetes Mother   . Heart failure Father   . Diabetes Brother   . Anesthesia problems Neg Hx   . Hypotension Neg Hx   . Malignant hyperthermia Neg Hx   . Pseudochol deficiency Neg Hx    History  Substance Use Topics  . Smoking status: Never Smoker   . Smokeless tobacco: Not on file  . Alcohol Use: No   OB History   Grav Para Term Preterm Abortions TAB SAB Ect Mult Living                 Review of Systems  Constitutional: Negative for fever and chills.  Cardiovascular: Positive for chest pain. Negative for leg swelling.  Gastrointestinal: Positive for nausea and abdominal pain.  Musculoskeletal: Positive for back pain.  All other systems reviewed and are negative.    Allergies  Promethazine hcl; Hydromorphone; and Nitrofurantoin monohyd macro  Home Medications   Current Outpatient Rx  Name  Route  Sig  Dispense  Refill  . hyoscyamine (LEVSIN, ANASPAZ) 0.125 MG tablet   Oral   Take 0.125 mg by mouth daily as needed for cramping.          Marland Kitchen ibuprofen (ADVIL,MOTRIN) 200 MG tablet   Oral   Take 200-400 mg by mouth daily as needed for pain. Pain         . pantoprazole (PROTONIX) 40 MG tablet   Oral   Take 40 mg by mouth 2 (two) times daily.          Marland Kitchen Phenyleph-Diphenhyd-GG-APAP (MUCINEX SINUS-MAX DAY/NIGHT PO)   Oral   Take 1 tablet by mouth once as needed (FOR RELIEF OF SYMPTOMS).         Marland Kitchen aluminum-magnesium hydroxide (MAG-AL) 200-200 MG/5ML suspension   Oral   Take 10 mLs by  mouth every 6 (six) hours as needed for indigestion.   355 mL   0   . HYDROcodone-acetaminophen (NORCO/VICODIN) 5-325 MG per tablet   Oral   Take 1 tablet by mouth every 4 (four) hours as needed for pain.   10 tablet   0    BP 117/61  Pulse 62  Temp(Src) 97.9 F (36.6 C) (Oral)  Resp 18  SpO2 97%  LMP 04/09/2011 Physical Exam  Constitutional: She is oriented to person, place, and time. She appears well-developed and well-nourished. No distress.  HENT:  Head: Normocephalic and atraumatic.  Right Ear: External ear normal.  Left Ear: External ear normal.  Nose: Nose normal.  Eyes: Conjunctivae and EOM are normal. Pupils are equal, round, and reactive to light.  Neck: Neck supple.  Cardiovascular: Normal rate, regular rhythm, normal heart sounds and intact distal pulses.   Pulmonary/Chest: Effort normal and breath sounds normal. She exhibits tenderness.  Abdominal: Soft. There is no tenderness.  Musculoskeletal: Normal range of motion. She exhibits no edema.  Neurological: She is alert and oriented to person, place, and time. She exhibits normal muscle tone.  Skin: Skin is warm and dry. She is not diaphoretic.    ED Course  Procedures (including critical care time)  Medications  nitroGLYCERIN (NITROSTAT) SL tablet 0.4 mg (not administered)  morphine 4 MG/ML injection 4 mg (4 mg Intravenous Given 06/15/13 1432)  morphine 4 MG/ML injection 4 mg (not administered)  diphenhydrAMINE (BENADRYL) injection 25 mg (25 mg Intravenous Given 06/15/13 1432)  iohexol (OMNIPAQUE) 350 MG/ML injection 80 mL (80 mLs Intravenous Contrast Given 06/15/13 1524)  gi cocktail (Maalox,Lidocaine,Donnatal) (30 mLs Oral Given 06/15/13 1600)      Date: 06/15/2013  Rate: 75  Rhythm: normal sinus rhythm  QRS Axis: normal  Intervals: normal  ST/T Wave abnormalities: nonspecific ST changes  Conduction Disutrbances:none  Narrative Interpretation:   Old EKG Reviewed: unchanged   Labs Review Labs Reviewed   CBC - Abnormal; Notable for the following:    RBC 5.80 (*)    Hemoglobin 17.2 (*)    HCT 47.5 (*)    MCHC 36.2 (*)    All other components within normal limits  BASIC METABOLIC PANEL - Abnormal; Notable for the following:    Glucose, Bld 108 (*)    All other components within normal limits  URINALYSIS, ROUTINE W REFLEX MICROSCOPIC  HEPATIC FUNCTION PANEL  POCT I-STAT TROPONIN I   Imaging Review Dg Chest 2 View  06/15/2013   *RADIOLOGY REPORT*  Clinical Data: Shortness of breath for 2 days  CHEST - 2 VIEW  Comparison: 04/28/2011; chest CT - 04/28/2011  Findings:  Grossly unchanged  enlarged cardiac silhouette.  Normal mediastinal contours.  Improved aeration of the bilateral lung bases with persistent left basilar heterogeneous opacities favored to represent atelectasis.  No new focal airspace opacity.  No definite pleural effusion or pneumothorax.  No evidence of edema.  Unchanged bones.  Post cholecystectomy.  IMPRESSION:  1.  No acute cardiopulmonary disease. 2.  Improved aeration of the lungs with minimal residual left basilar atelectasis.   Original Report Authenticated By: Tacey Ruiz, MD   Ct Angio Chest Pe W/cm &/or Wo Cm  06/15/2013   *RADIOLOGY REPORT*  Clinical Data: Chest pain, shortness of breath, evaluate for pulmonary embolism  CT ANGIOGRAPHY CHEST  Technique:  Multidetector CT imaging of the chest using the standard protocol during bolus administration of intravenous contrast. Multiplanar reconstructed images including MIPs were obtained and reviewed to evaluate the vascular anatomy.  Contrast: 80mL OMNIPAQUE IOHEXOL 350 MG/ML SOLN  Comparison: 04/28/2011; chest radiograph - earlier same day; CT abdomen and pelvis - 01/18/2012  Vascular Findings:  There is adequate opacification of the pulmonary arterial system of the main pulmonary artery measuring 401 HU.  There are no discrete filling defects within the pulmonary arterial tree to the level of the bilateral subsegmental pulmonary  arteries.  Evaluation of the distal subsegmental pulmonary arteries is degraded secondary to patient body habitus ([employer] artifact) and motion.  Normal caliber of the main pulmonary artery.  Borderline cardiomegaly. No pericardial effusion.  Normal caliber of the thoracic aorta.  Bovine configuration of the aortic arch is incidentally noted.  No definite thoracic aortic dissection or periaortic stranding.  -------------------------------------------------------  Non-vascular findings:  Evaluation of the pulmonary parenchyma is degraded secondary to patient body habitus and respiratory artifact, in particular, the bilateral lower lobes.  There are grossly symmetric dependent areas of ground-glass favored to represent atelectasis.  No focal airspace opacity.  No pleural effusion or pneumothorax.  Given limitations of the examination, there are no discrete pulmonary nodules.  The central pulmonary airways are widely patent.  No mediastinal, hilar or axillary lymphadenopathy.  Early arterial phase evaluation of the upper abdomen demonstrates the sequela of post cholecystectomy with pneumobilia seen within the nondependent left lobe of the liver, similar to prior abdominal CT and presumably the sequela of prior biliary sphincterotomy. Suspect small hiatal hernia.  No acute or aggressive osseous abnormalities.  IMPRESSION: 1.  No acute cardiopulmonary disease.  Specifically, no evidence of pulmonary embolism to the level of the bilateral subsegmental pulmonary arteries. 2.  Borderline cardiomegaly. 3.  Post cholecystectomy and biliary sphincterotomy.  4.  Suspected small hiatal hernia.   Original Report Authenticated By: Tacey Ruiz, MD    MDM   1. Chest pain, atypical   2. GERD (gastroesophageal reflux disease)    Afebrile, NAD, non-toxic appearing, AAOx4. Patient is to be discharged with recommendation to follow up with PCP in regards to today's hospital visit. Chest pain is not likely of cardiac or  pulmonary etiology d/t presentation, negative CTA, VSS, no tracheal deviation, no JVD or new murmur, RRR, breath sounds equal bilaterally, EKG without acute abnormalities, negative troponin, and negative CXR. Pt has been advised start add Maalox to PPI and return to the ED is CP becomes exertional, associated with diaphoresis or nausea, radiates to left jaw/arm, worsens or becomes concerning in any way. Pt appears reliable for follow up and is agreeable to discharge.   Case has been discussed with and seen by Dr. Freida Busman who agrees with the above plan to discharge. Patient is  stable at time of discharge       Jeannetta Ellis, PA-C 06/15/13 1726

## 2013-06-15 NOTE — ED Notes (Signed)
Pt. Stated, On Thursday I started having CP and I thought it was my acid reflux, I take the protonix .  It went away and then the pain came back, i even had some throwing up.  Friday am the pain came back and then it eased off.  This am the pain was there again, and my rt. Arm hurts and it goes up in my neck. I've had this pain before, 5 years ago I had a clot in my lung.

## 2013-06-15 NOTE — ED Notes (Signed)
Pt transported to XR.  

## 2013-06-16 NOTE — ED Provider Notes (Signed)
Medical screening examination/treatment/procedure(s) were performed by non-physician practitioner and as supervising physician I was immediately available for consultation/collaboration.  Toy Baker, MD 06/16/13 2041

## 2013-06-18 ENCOUNTER — Other Ambulatory Visit (INDEPENDENT_AMBULATORY_CARE_PROVIDER_SITE_OTHER): Payer: Self-pay | Admitting: Internal Medicine

## 2013-06-18 MED ORDER — HYOSCYAMINE SULFATE 0.125 MG PO TABS: 0.1250 mg | ORAL_TABLET | Freq: Every day | ORAL | Status: DC | PRN

## 2013-06-20 ENCOUNTER — Ambulatory Visit (INDEPENDENT_AMBULATORY_CARE_PROVIDER_SITE_OTHER): Payer: Managed Care, Other (non HMO) | Admitting: Internal Medicine

## 2013-06-20 ENCOUNTER — Encounter (INDEPENDENT_AMBULATORY_CARE_PROVIDER_SITE_OTHER): Payer: Self-pay | Admitting: Internal Medicine

## 2013-06-20 VITALS — BP 102/62 | HR 68 | Ht 63.0 in | Wt 181.4 lb

## 2013-06-20 DIAGNOSIS — K219 Gastro-esophageal reflux disease without esophagitis: Secondary | ICD-10-CM

## 2013-06-20 NOTE — Patient Instructions (Addendum)
Will try Dexilant Samples. Keep HOB elevated. Avoid spicy foods.

## 2013-06-20 NOTE — Progress Notes (Signed)
Subjective:     Patient ID: Precious Bard, female   DOB: 02-20-1956, 57 y.o.   MRN: [id number]  HPI Seen in the ED this past Saturday with chest pain. She had had chest pain x 3 days.  Cardiac work up was negative.  She received Morphine IV. Her pain was relieved. Chest xray was normal.  She said she had terrible acid reflux which radiated down her rt arm. She says this was the same symptoms as when she had the blood clot in her  lung 6 yrs ago. She underwent a CT angio of the chest while in the ED which was normal.   CT angio of chest on 06/18/2013: IMPRESSION:  1. No acute cardiopulmonary disease. Specifically, no evidence of  pulmonary embolism to the level of the bilateral subsegmental  pulmonary arteries.  2. Borderline cardiomegaly.  3. Post cholecystectomy and biliary sphincterotomy.  4. Suspected small hiatal hernia.  She tells me she is still having acid reflux.  She has some heart burn. She has been avoid spicy foods and fried foods. She is avoiding sweet tea.  The pain occurs before she eats and when she wakes up in the am.  She is not having any pain now.  She takes her Protonix 30 minutes before eating and 30 minutes before supper.  If she has epigastric pain, she will take a Levsin and the pain will ease.  Her appetite is good. No weight loss. BMs are normal. No melena or bright red rectal bleeding.   CMP     Component Value Date/Time   NA 139 06/15/2013 1310   K 3.8 06/15/2013 1310   CL 104 06/15/2013 1310   CO2 23 06/15/2013 1310   GLUCOSE 108* 06/15/2013 1310   BUN 10 06/15/2013 1310   CREATININE 0.79 06/15/2013 1310   CREATININE 0.85 11/03/2011 1205   CALCIUM 9.5 06/15/2013 1310   PROT 7.2 06/15/2013 1410   ALBUMIN 3.7 06/15/2013 1410   AST 23 06/15/2013 1410   ALT 33 06/15/2013 1410   ALKPHOS 111 06/15/2013 1410   BILITOT 0.5 06/15/2013 1410   GFRNONAA >90 06/15/2013 1310   GFRAA >90 06/15/2013 1310    CBC    Component Value Date/Time   WBC 8.0 06/15/2013 1310   RBC 5.80* 06/15/2013 1310    HGB 17.2* 06/15/2013 1310   HCT 47.5* 06/15/2013 1310   PLT 212 06/15/2013 1310   MCV 81.9 06/15/2013 1310   MCH 29.7 06/15/2013 1310   MCHC 36.2* 06/15/2013 1310   RDW 13.3 06/15/2013 1310   LYMPHSABS 2.2 08/14/2011 1125   MONOABS 0.5 08/14/2011 1125   EOSABS 0.2 08/14/2011 1125   BASOSABS 0.0 08/14/2011 1125     Review of Systems see hpi has a current medication list which includes the following prescription(s): aluminum-magnesium hydroxide, hydrocodone-acetaminophen, hyoscyamine, ibuprofen, pantoprazole, and phenyleph-diphenhyd-gg-apap. Past Medical History  Diagnosis Date  . Pulmonary embolism     5 yrs ago (unknown region)  . GERD (gastroesophageal reflux disease)    Current Outpatient Prescriptions on File Prior to Visit  Medication Sig Dispense Refill  . aluminum-magnesium hydroxide (MAG-AL) 200-200 MG/5ML suspension Take 10 mLs by mouth every 6 (six) hours as needed for indigestion.  355 mL  0  . HYDROcodone-acetaminophen (NORCO/VICODIN) 5-325 MG per tablet Take 1 tablet by mouth every 4 (four) hours as needed for pain.  10 tablet  0  . hyoscyamine (LEVSIN, ANASPAZ) 0.125 MG tablet Take 1 tablet (0.125 mg total) by mouth daily as needed  for cramping.  90 tablet  3  . ibuprofen (ADVIL,MOTRIN) 200 MG tablet Take 200-400 mg by mouth daily as needed for pain. Pain      . pantoprazole (PROTONIX) 40 MG tablet Take 40 mg by mouth 2 (two) times daily.       Marland Kitchen Phenyleph-Diphenhyd-GG-APAP (MUCINEX SINUS-MAX DAY/NIGHT PO) Take 1 tablet by mouth once as needed (FOR RELIEF OF SYMPTOMS).       No current facility-administered medications on file prior to visit.   Past Surgical History  Procedure Laterality Date  . Cesarean section  1991    mc  . Cholecystectomy  15 yrs ago    [facility]  . Ercp w/ sphicterotomy  08/12/11    Dr Rehman-?microlithiasis, SOD  . Ercp  08/12/2011    Procedure: ENDOSCOPIC RETROGRADE CHOLANGIOPANCREATOGRAPHY (ERCP);  Surgeon: Malissa Hippo, MD;  Location: [facility];  Service:  Endoscopy;  Laterality: N/A;  . Sphincterotomy  08/12/2011    Procedure: SPHINCTEROTOMY;  Surgeon: Malissa Hippo, MD;  Location: [facility];  Service: Endoscopy;;  with ballon passage  . Esophagogastroduodenoscopy  11/25/2011    Procedure: ESOPHAGOGASTRODUODENOSCOPY (EGD);  Surgeon: Malissa Hippo, MD;  [facility]: [facility];  Service: Endoscopy;  Laterality: N/A;  830  . Eus  01/19/2012    Procedure: UPPER ENDOSCOPIC ULTRASOUND (EUS) LINEAR;  Surgeon: Rachael Fee, MD;  Location: [facility];  Service: Endoscopy;  Laterality: N/A;  pt moved up a hour early by AW , Patty @ [place] office ok'd / pt was called by AW  . Colonoscopy  05/24/2012    Procedure: COLONOSCOPY;  Surgeon: Malissa Hippo, MD;  [facility]: [facility];  Service: Endoscopy;  Laterality: N/A;  220   Allergies  Allergen Reactions  . Promethazine Hcl Hives  . Hydromorphone Itching  . Nitrofurantoin Monohyd Macro Itching        Objective:   Physical Exam  Filed Vitals:   06/20/13 1600  BP: 102/62  Pulse: 68  Height: 5\' 3"  (1.6 m)  Weight: 181 lb 6.4 oz (82.283 kg)   Alert and oriented. Skin warm and dry. Oral mucosa is moist.   . Sclera anicteric, conjunctivae is pink. Thyroid not enlarged. No cervical lymphadenopathy. Lungs clear. Heart regular rate and rhythm.  Abdomen is soft. Bowel sounds are positive. No hepatomegaly. No abdominal masses felt. No tenderness.  No edema to lower extremities.       Assessment:   Genella Rife not controlled at this time. Recent visit to ED ruled Pulmonary Emboli.    Plan:  I am going to change her Protonix to Dexilant once a day and see how she does. She will call with a progress report in 2 weeks.  If better will eprescribed to her pharmacy and Rx for Dexilant.

## 2013-07-09 ENCOUNTER — Other Ambulatory Visit: Payer: Self-pay

## 2013-07-09 DIAGNOSIS — Z1231 Encounter for screening mammogram for malignant neoplasm of breast: Secondary | ICD-10-CM

## 2013-07-15 ENCOUNTER — Telehealth (INDEPENDENT_AMBULATORY_CARE_PROVIDER_SITE_OTHER): Payer: Self-pay | Admitting: *Deleted

## 2013-07-15 DIAGNOSIS — K219 Gastro-esophageal reflux disease without esophagitis: Secondary | ICD-10-CM

## 2013-07-15 MED ORDER — DEXLANSOPRAZOLE 60 MG PO CPDR: 60.0000 mg | DELAYED_RELEASE_CAPSULE | Freq: Every day | ORAL | Status: DC

## 2013-07-15 NOTE — Telephone Encounter (Signed)
Message left at home 

## 2013-07-15 NOTE — Telephone Encounter (Signed)
Need a Rx sent or called in to [facility]. Only has had samples. Her return phone number is [phone].

## 2013-07-15 NOTE — Telephone Encounter (Signed)
Eprescribed Dexilant to her pharmacy.

## 2013-09-04 ENCOUNTER — Encounter (INDEPENDENT_AMBULATORY_CARE_PROVIDER_SITE_OTHER): Payer: Self-pay | Admitting: *Deleted

## 2013-11-07 ENCOUNTER — Telehealth (INDEPENDENT_AMBULATORY_CARE_PROVIDER_SITE_OTHER): Payer: Self-pay | Admitting: *Deleted

## 2013-11-07 ENCOUNTER — Other Ambulatory Visit (INDEPENDENT_AMBULATORY_CARE_PROVIDER_SITE_OTHER): Payer: Self-pay | Admitting: Internal Medicine

## 2013-11-07 DIAGNOSIS — K219 Gastro-esophageal reflux disease without esophagitis: Secondary | ICD-10-CM

## 2013-11-07 MED ORDER — PANTOPRAZOLE SODIUM 20 MG PO TBEC: 20.0000 mg | DELAYED_RELEASE_TABLET | Freq: Two times a day (BID) | ORAL | Status: DC

## 2013-11-07 NOTE — Telephone Encounter (Signed)
Nnenna would like to speak with Deberah Castle, NP. The patient feels like the  Dexilant is to strong for her. It leave her wired up and trouble sleeping. Please call Tosca at [phone].

## 2013-11-07 NOTE — Telephone Encounter (Signed)
I called in Protonix 20mg  BID with 3 refills to see how she does

## 2013-11-21 ENCOUNTER — Ambulatory Visit (INDEPENDENT_AMBULATORY_CARE_PROVIDER_SITE_OTHER): Payer: Managed Care, Other (non HMO) | Admitting: Emergency Medicine

## 2013-11-21 VITALS — BP 120/82 | HR 68 | Temp 98.2°F | Resp 16 | Ht 63.0 in | Wt 182.2 lb

## 2013-11-21 DIAGNOSIS — L723 Sebaceous cyst: Secondary | ICD-10-CM

## 2013-11-21 NOTE — Progress Notes (Signed)
[facility] [address] [place] [address] [phone]  Date:  11/21/2013   Name:  Mallory [name]   DOB:  11-Jan-1956   MRN:  [id number]  PCP:  Tula Nakayama    Chief Complaint: Arm Swelling   History of Present Illness:  Mallory [name] is a 58 y.o. very pleasant female patient who presents with the following:  Noticed two masses on the left forearm and is concerned they may be clots.  No history of injury.  No tenderness, swelling or bruising. No improvement with over the counter medications or other home remedies. Denies other complaint or health concern today.   Patient Active Problem List   Diagnosis Date Noted  . IBS (irritable bowel syndrome) 04/26/2012  . Abdominal pain, acute, periumbilical 84/69/6295  . Duodenal perforation 08/14/2011  . GERD (gastroesophageal reflux disease) 08/14/2011  . PE (pulmonary embolism) 08/14/2011    Past Medical History  Diagnosis Date  . Pulmonary embolism     5 yrs ago (unknown region)  . GERD (gastroesophageal reflux disease)     Past Surgical History  Procedure Laterality Date  . Cesarean section  1991    mc  . Cholecystectomy  15 yrs ago    [facility]  . Ercp w/ sphicterotomy  08/12/11    Dr Rehman-?microlithiasis, SOD  . Ercp  08/12/2011    Procedure: ENDOSCOPIC RETROGRADE CHOLANGIOPANCREATOGRAPHY (ERCP);  Surgeon: Rogene Houston, MD;  Location: [facility];  Service: Endoscopy;  Laterality: N/A;  . Sphincterotomy  08/12/2011    Procedure: SPHINCTEROTOMY;  Surgeon: Rogene Houston, MD;  Location: [facility];  Service: Endoscopy;;  with ballon passage  . Esophagogastroduodenoscopy  11/25/2011    Procedure: ESOPHAGOGASTRODUODENOSCOPY (EGD);  Surgeon: Rogene Houston, MD;  [facility];  Service: Endoscopy;  Laterality: N/A;  830  . Eus  01/19/2012    Procedure: UPPER ENDOSCOPIC ULTRASOUND (EUS) LINEAR;  Surgeon: Milus Banister, MD;  [facility];  Service: Endoscopy;  Laterality: N/A;  pt moved up  a hour early by AW , Patty @ [place] office ok'd / pt was called by AW  . Colonoscopy  05/24/2012    Procedure: COLONOSCOPY;  Surgeon: Rogene Houston, MD;  [facility];  Service: Endoscopy;  Laterality: N/A;  220    History  Substance Use Topics  . Smoking status: Never Smoker   . Smokeless tobacco: Not on file  . Alcohol Use: No    Family History  Problem Relation Age of Onset  . Diabetes Mother   . Heart failure Father   . Diabetes Brother   . Anesthesia problems Neg Hx   . Hypotension Neg Hx   . Malignant hyperthermia Neg Hx   . Pseudochol deficiency Neg Hx     Allergies  Allergen Reactions  . Promethazine Hcl Hives  . Hydromorphone Itching  . Nitrofurantoin Monohyd Macro Itching    Medication list has been reviewed and updated.  Current Outpatient Prescriptions on File Prior to Visit  Medication Sig Dispense Refill  . aluminum-magnesium hydroxide (MAG-AL) 200-200 MG/5ML suspension Take 10 mLs by mouth every 6 (six) hours as needed for indigestion.  355 mL  0  . hyoscyamine (LEVSIN, ANASPAZ) 0.125 MG tablet Take 1 tablet (0.125 mg total) by mouth daily as needed for cramping.  90 tablet  3  . ibuprofen (ADVIL,MOTRIN) 200 MG tablet Take 200-400 mg by mouth daily as needed for pain. Pain      . pantoprazole (PROTONIX)  20 MG tablet Take 1 tablet (20 mg total) by mouth 2 (two) times daily.  60 tablet  3  . Phenyleph-Diphenhyd-GG-APAP (MUCINEX SINUS-MAX DAY/NIGHT PO) Take 1 tablet by mouth once as needed (FOR RELIEF OF SYMPTOMS).      Marland Kitchen dexlansoprazole (DEXILANT) 60 MG capsule Take 1 capsule (60 mg total) by mouth daily.  30 capsule  5  . HYDROcodone-acetaminophen (NORCO/VICODIN) 5-325 MG per tablet Take 1 tablet by mouth every 4 (four) hours as needed for pain.  10 tablet  0   No current facility-administered medications on file prior to visit.    Review of Systems:  As per HPI, otherwise negative.    Physical Examination: Filed Vitals:   11/21/13 1417   BP: 120/82  Pulse: 68  Temp: 98.2 F (36.8 C)  Resp: 16   Filed Vitals:   11/21/13 1417  Height: 5\' 3"  (1.6 m)  Weight: 182 lb 3.2 oz (82.645 kg)   Body mass index is 32.28 kg/(m^2). Ideal Body Weight: Weight in (lb) to have BMI = 25: 140.8   GEN: WDWN, NAD, Non-toxic, Alert & Oriented x 3 HEENT: Atraumatic, Normocephalic.  Ears and Nose: No external deformity. EXTR: No clubbing/cyanosis/edema NEURO: Normal gait.  PSYCH: Normally interactive. Conversant. Not depressed or anxious appearing.  Calm demeanor.  LEFT forearm:  Two distinct soft, smooth, mobile non tender masses on left proximal forearm.  Assessment and Plan: Mass, either lipoma or sebaceous cyst Refer to surgeon at patient request.   Signed,  Ellison Carwin, MD

## 2013-12-12 ENCOUNTER — Emergency Department (HOSPITAL_COMMUNITY): Payer: Managed Care, Other (non HMO)

## 2013-12-12 ENCOUNTER — Encounter (HOSPITAL_COMMUNITY): Payer: Self-pay | Admitting: Emergency Medicine

## 2013-12-12 ENCOUNTER — Emergency Department (HOSPITAL_COMMUNITY)
Admission: EM | Admit: 2013-12-12 | Discharge: 2013-12-13 | Disposition: A | Discharge status: Home / Self Care | Payer: Managed Care, Other (non HMO) | Attending: Emergency Medicine | Admitting: Emergency Medicine

## 2013-12-12 DIAGNOSIS — R109 Unspecified abdominal pain: Secondary | ICD-10-CM

## 2013-12-12 DIAGNOSIS — K219 Gastro-esophageal reflux disease without esophagitis: Secondary | ICD-10-CM | POA: Insufficient documentation

## 2013-12-12 DIAGNOSIS — Z79899 Other long term (current) drug therapy: Secondary | ICD-10-CM | POA: Insufficient documentation

## 2013-12-12 DIAGNOSIS — Z86711 Personal history of pulmonary embolism: Secondary | ICD-10-CM | POA: Insufficient documentation

## 2013-12-12 DIAGNOSIS — Z9089 Acquired absence of other organs: Secondary | ICD-10-CM | POA: Insufficient documentation

## 2013-12-12 DIAGNOSIS — K529 Noninfective gastroenteritis and colitis, unspecified: Secondary | ICD-10-CM

## 2013-12-12 DIAGNOSIS — K6389 Other specified diseases of intestine: Secondary | ICD-10-CM

## 2013-12-12 DIAGNOSIS — R1012 Left upper quadrant pain: Secondary | ICD-10-CM | POA: Insufficient documentation

## 2013-12-12 LAB — CBC WITH DIFFERENTIAL/PLATELET
Basophils Absolute: 0 10*3/uL (ref 0.0–0.1)
Basophils Relative: 1 % (ref 0–1)
EOS PCT: 2 % (ref 0–5)
Eosinophils Absolute: 0.2 10*3/uL (ref 0.0–0.7)
HEMATOCRIT: 45.9 % (ref 36.0–46.0)
Hemoglobin: 15.9 g/dL — ABNORMAL HIGH (ref 12.0–15.0)
LYMPHS ABS: 2.6 10*3/uL (ref 0.7–4.0)
LYMPHS PCT: 32 % (ref 12–46)
MCH: 28.4 pg (ref 26.0–34.0)
MCHC: 34.6 g/dL (ref 30.0–36.0)
MCV: 82 fL (ref 78.0–100.0)
MONO ABS: 0.8 10*3/uL (ref 0.1–1.0)
MONOS PCT: 9 % (ref 3–12)
Neutro Abs: 4.6 10*3/uL (ref 1.7–7.7)
Neutrophils Relative %: 56 % (ref 43–77)
Platelets: 221 10*3/uL (ref 150–400)
RBC: 5.6 MIL/uL — AB (ref 3.87–5.11)
RDW: 13.9 % (ref 11.5–15.5)
WBC: 8.2 10*3/uL (ref 4.0–10.5)

## 2013-12-12 LAB — URINALYSIS, ROUTINE W REFLEX MICROSCOPIC
BILIRUBIN URINE: NEGATIVE
Glucose, UA: NEGATIVE mg/dL
Hgb urine dipstick: NEGATIVE
Ketones, ur: NEGATIVE mg/dL
Leukocytes, UA: NEGATIVE
NITRITE: NEGATIVE
Protein, ur: NEGATIVE mg/dL
SPECIFIC GRAVITY, URINE: 1.014 (ref 1.005–1.030)
UROBILINOGEN UA: 1 mg/dL (ref 0.0–1.0)
pH: 6 (ref 5.0–8.0)

## 2013-12-12 LAB — LIPASE, BLOOD: Lipase: 55 U/L (ref 11–59)

## 2013-12-12 LAB — COMPREHENSIVE METABOLIC PANEL
ALT: 24 U/L (ref 0–35)
AST: 24 U/L (ref 0–37)
Albumin: 3.6 g/dL (ref 3.5–5.2)
Alkaline Phosphatase: 110 U/L (ref 39–117)
BUN: 9 mg/dL (ref 6–23)
CALCIUM: 9.4 mg/dL (ref 8.4–10.5)
CO2: 29 meq/L (ref 19–32)
CREATININE: 0.8 mg/dL (ref 0.50–1.10)
Chloride: 103 mEq/L (ref 96–112)
GFR, EST NON AFRICAN AMERICAN: 81 mL/min — AB (ref >90)
GLUCOSE: 110 mg/dL — AB (ref 70–99)
Potassium: 4.1 mEq/L (ref 3.7–5.3)
Sodium: 142 mEq/L (ref 137–147)
Total Bilirubin: 0.4 mg/dL (ref 0.3–1.2)
Total Protein: 7 g/dL (ref 6.0–8.3)

## 2013-12-12 MED ORDER — IOHEXOL 300 MG/ML  SOLN
100.0000 mL | Freq: Once | INTRAMUSCULAR | Status: AC | PRN
  Administered 2013-12-12: 100 mL via INTRAVENOUS

## 2013-12-12 MED ORDER — SODIUM CHLORIDE 0.9 % IV BOLUS (SEPSIS)
1000.0000 mL | Freq: Once | INTRAVENOUS | Status: AC
  Administered 2013-12-12: 1000 mL via INTRAVENOUS

## 2013-12-12 MED ORDER — IOHEXOL 300 MG/ML  SOLN
25.0000 mL | INTRAMUSCULAR | Status: AC
  Administered 2013-12-12: 25 mL via ORAL

## 2013-12-12 MED ORDER — MORPHINE SULFATE 4 MG/ML IJ SOLN
6.0000 mg | Freq: Once | INTRAMUSCULAR | Status: AC
  Administered 2013-12-12: 6 mg via INTRAVENOUS
  Filled 2013-12-12: qty 2

## 2013-12-12 MED ORDER — ONDANSETRON HCL 4 MG/2ML IJ SOLN
4.0000 mg | Freq: Once | INTRAMUSCULAR | Status: AC
  Administered 2013-12-12: 4 mg via INTRAVENOUS
  Filled 2013-12-12: qty 2

## 2013-12-12 NOTE — ED Notes (Signed)
Patient transported to CT 

## 2013-12-12 NOTE — ED Notes (Signed)
Presents with lower abdominal pain on both sides began Tuesday evening post pap smear. Pain is described as intermittent and cramping. Denies nausea, vomiting and diarrhea. dnies vaginal bleeding and discharge, dneis urinary symptoms. Last BM today-normal reports loose stools over the weekend.

## 2013-12-13 MED ORDER — HYDROCODONE-ACETAMINOPHEN 5-325 MG PO TABS: 1.0000 | ORAL_TABLET | Freq: Four times a day (QID) | ORAL | Status: DC | PRN

## 2013-12-13 MED ORDER — MORPHINE SULFATE 4 MG/ML IJ SOLN
6.0000 mg | Freq: Once | INTRAMUSCULAR | Status: AC
Start: 2013-12-13 — End: 2013-12-13
  Administered 2013-12-13: 6 mg via INTRAVENOUS
  Filled 2013-12-13: qty 2

## 2013-12-13 MED ORDER — SUCRALFATE 1 G PO TABS: 1.0000 g | ORAL_TABLET | Freq: Three times a day (TID) | ORAL | Status: DC

## 2013-12-13 MED ORDER — KETOROLAC TROMETHAMINE 30 MG/ML IJ SOLN
30.0000 mg | Freq: Once | INTRAMUSCULAR | Status: AC
  Administered 2013-12-13: 30 mg via INTRAVENOUS
  Filled 2013-12-13: qty 1

## 2013-12-13 NOTE — ED Provider Notes (Signed)
Medical screening examination/treatment/procedure(s) were performed by non-physician practitioner and as supervising physician I was immediately available for consultation/collaboration.   EKG Interpretation None        Delice Bison Bryant Saye, DO 12/13/13 0040

## 2013-12-13 NOTE — Discharge Instructions (Signed)
Return here as needed.  Followup with your doctor in the GI Dr. Provided.

## 2013-12-13 NOTE — ED Provider Notes (Addendum)
CSN: [id number]     Arrival date & time 12/12/13  2032 History   First MD Initiated Contact with Patient 12/12/13 2144     Chief Complaint  Patient presents with  . Abdominal Pain     (Consider location/radiation/quality/duration/timing/severity/associated sxs/prior Treatment) HPI Patient presents to the emergency department with left upper abdominal pain, that began Tuesday evening.  The patient, states, that she's had similar complaints in the past.  Patient, states, that palpation makes her pain, worse.  Patient's had some mild nausea, but no vomiting.  She denies diarrhea, chest pain, shortness of breath, weakness, dizziness, fatigue, decreased urine output, dysuria, back pain, rash, headache, blurred vision, or syncope.  Patient, states, that she did take ibuprofen without relief of her pain.  Patient, states nothing seems make her condition, better, but palpation makes her pain, worse Past Medical History  Diagnosis Date  . Pulmonary embolism     5 yrs ago (unknown region)  . GERD (gastroesophageal reflux disease)    Past Surgical History  Procedure Laterality Date  . Cesarean section  1991    [facility]  . Cholecystectomy  15 yrs ago    [facility]  . Ercp w/ sphicterotomy  08/12/11    Dr Rehman-?microlithiasis, SOD  . Ercp  08/12/2011    Procedure: ENDOSCOPIC RETROGRADE CHOLANGIOPANCREATOGRAPHY (ERCP);  Surgeon: Rogene Houston, MD;  Location: [facility];  Service: Endoscopy;  Laterality: N/A;  . Sphincterotomy  08/12/2011    Procedure: SPHINCTEROTOMY;  Surgeon: Rogene Houston, MD;  Location: [facility];  Service: Endoscopy;;  with ballon passage  . Esophagogastroduodenoscopy  11/25/2011    Procedure: ESOPHAGOGASTRODUODENOSCOPY (EGD);  Surgeon: Rogene Houston, MD;  [facility];  Service: Endoscopy;  Laterality: N/A;  830  . Eus  01/19/2012    Procedure: UPPER ENDOSCOPIC ULTRASOUND (EUS) LINEAR;  Surgeon: Milus Banister, MD;  [facility]: [facility];  Service: Endoscopy;  Laterality: N/A;  pt  moved up a hour early by AW , Patty @ [place] office ok'd / pt was called by AW  . Colonoscopy  05/24/2012    Procedure: COLONOSCOPY;  Surgeon: Rogene Houston, MD;  [facility];  Service: Endoscopy;  Laterality: N/A;  24   Family History  Problem Relation Age of Onset  . Diabetes Mother   . Heart failure Father   . Diabetes Brother   . Anesthesia problems Neg Hx   . Hypotension Neg Hx   . Malignant hyperthermia Neg Hx   . Pseudochol deficiency Neg Hx    History  Substance Use Topics  . Smoking status: Never Smoker   . Smokeless tobacco: Not on file  . Alcohol Use: No   OB History   Grav Para Term Preterm Abortions TAB SAB Ect Mult Living                 Review of Systems All other systems negative except as documented in the HPI. All pertinent positives and negatives as reviewed in the HPI.   Allergies  Promethazine hcl; Hydromorphone; and Nitrofurantoin monohyd macro  Home Medications   Current Outpatient Rx  Name  Route  Sig  Dispense  Refill  . aluminum-magnesium hydroxide (MAG-AL) 200-200 MG/5ML suspension   Oral   Take 10 mLs by mouth every 6 (six) hours as needed for indigestion.   355 mL   0   . hyoscyamine (LEVSIN, ANASPAZ) 0.125 MG tablet   Oral   Take 1 tablet (0.125 mg total) by mouth daily as  needed for cramping.   90 tablet   3   . ibuprofen (ADVIL,MOTRIN) 200 MG tablet   Oral   Take 200-400 mg by mouth daily as needed for pain. Pain         . pantoprazole (PROTONIX) 20 MG tablet   Oral   Take 1 tablet (20 mg total) by mouth 2 (two) times daily.   60 tablet   3   . Phenyleph-Diphenhyd-GG-APAP (MUCINEX SINUS-MAX DAY/NIGHT PO)   Oral   Take 1 tablet by mouth once as needed (FOR RELIEF OF SYMPTOMS).          BP 121/78  Pulse 62  Temp(Src) 97.6 F (36.4 C) (Oral)  Resp 19  SpO2 100%  LMP 04/09/2011 Physical Exam  Nursing note and vitals reviewed. Constitutional: She is oriented to person, place, and time. She appears  well-developed and well-nourished. No distress.  HENT:  Head: Normocephalic and atraumatic.  Mouth/Throat: Oropharynx is clear and moist.  Eyes: Pupils are equal, round, and reactive to light.  Neck: Normal range of motion. Neck supple.  Cardiovascular: Normal rate, regular rhythm and normal heart sounds.  Exam reveals no gallop and no friction rub.   No murmur heard. Pulmonary/Chest: Effort normal and breath sounds normal. No respiratory distress.  Abdominal: Soft. Normal appearance and bowel sounds are normal. She exhibits no distension. There is tenderness. There is no rigidity, no rebound and no guarding.    Neurological: She is alert and oriented to person, place, and time.  Skin: Skin is warm and dry.    ED Course  Procedures (including critical care time) Labs Review Labs Reviewed  CBC WITH DIFFERENTIAL - Abnormal; Notable for the following:    RBC 5.60 (*)    Hemoglobin 15.9 (*)    All other components within normal limits  COMPREHENSIVE METABOLIC PANEL - Abnormal; Notable for the following:    Glucose, Bld 110 (*)    GFR calc non Af Amer 81 (*)    All other components within normal limits  URINALYSIS, ROUTINE W REFLEX MICROSCOPIC  LIPASE, BLOOD   Patient has epiploic appendigitis.  She'll be referred to GI and her primary care, Dr. she's told to return here as needed.  Patient is advised of the findings and all questions were answered. The patient has had normal vital signs as well. The patient is feeling better following pain medications and IV fluids.    Brent General, PA-C 12/13/13 [place], PA-C 01/06/14 [phone]

## 2013-12-23 ENCOUNTER — Ambulatory Visit (INDEPENDENT_AMBULATORY_CARE_PROVIDER_SITE_OTHER): Payer: Managed Care, Other (non HMO) | Admitting: Internal Medicine

## 2014-01-07 NOTE — ED Provider Notes (Signed)
Medical screening examination/treatment/procedure(s) were performed by non-physician practitioner and as supervising physician I was immediately available for consultation/collaboration.   EKG Interpretation None        Delice Bison Mekisha Bittel, DO 01/07/14 1884

## 2014-07-28 ENCOUNTER — Other Ambulatory Visit (INDEPENDENT_AMBULATORY_CARE_PROVIDER_SITE_OTHER): Payer: Self-pay | Admitting: Internal Medicine

## 2014-08-03 DIAGNOSIS — R3129 Other microscopic hematuria: Secondary | ICD-10-CM | POA: Insufficient documentation

## 2014-08-03 DIAGNOSIS — R109 Unspecified abdominal pain: Secondary | ICD-10-CM | POA: Insufficient documentation

## 2014-08-03 DIAGNOSIS — N870 Mild cervical dysplasia: Secondary | ICD-10-CM | POA: Insufficient documentation

## 2014-08-03 DIAGNOSIS — R300 Dysuria: Secondary | ICD-10-CM | POA: Insufficient documentation

## 2014-08-03 DIAGNOSIS — N950 Postmenopausal bleeding: Secondary | ICD-10-CM | POA: Insufficient documentation

## 2014-08-03 DIAGNOSIS — IMO0002 Reserved for concepts with insufficient information to code with codable children: Secondary | ICD-10-CM | POA: Insufficient documentation

## 2014-08-03 DIAGNOSIS — R12 Heartburn: Secondary | ICD-10-CM | POA: Insufficient documentation

## 2014-09-25 ENCOUNTER — Ambulatory Visit: Payer: Managed Care, Other (non HMO) | Admitting: Internal Medicine

## 2014-10-20 ENCOUNTER — Ambulatory Visit: Payer: Managed Care, Other (non HMO) | Attending: Orthopaedic Surgery | Admitting: Physical Therapy

## 2014-10-20 ENCOUNTER — Telehealth: Payer: Self-pay | Admitting: *Deleted

## 2014-10-20 DIAGNOSIS — G729 Myopathy, unspecified: Secondary | ICD-10-CM | POA: Diagnosis not present

## 2014-10-20 DIAGNOSIS — M7051 Other bursitis of knee, right knee: Secondary | ICD-10-CM | POA: Diagnosis not present

## 2014-10-20 DIAGNOSIS — M722 Plantar fascial fibromatosis: Secondary | ICD-10-CM

## 2014-10-20 DIAGNOSIS — M6289 Other specified disorders of muscle: Secondary | ICD-10-CM

## 2014-10-20 NOTE — Patient Instructions (Addendum)
SEATED Gastroc / Heel Cord Stretch - Seated With Towel   Sit on floor, towel around ball of foot. Gently pull foot in toward body, stretching heel cord and calf. Hold for _30__ seconds. Repeat on involved leg. Repeat __3_ times. Do _3__ times per day.    Achilles / Gastroc, Standing   Stand, right foot behind, heel on floor and turned slightly out, leg straight, forward leg bent. Move hips forward. Hold _30__ seconds. Repeat _3__ times per session. Do _3__ sessions per day.   Stretching: Soleus   Stand with right foot back, both knees bent. Keeping heel on floor, turned slightly out, lean into wall until stretch is felt in lower calf. Hold _30___ seconds. Repeat __3__ times per set. Do __3__ sessions per day.  http://orth.exer.us/665   Gastroc / Plantar Fascia, Standing     Gastroc / Heel Cord Stretch - On Step   Stand with heels over edge of stair. Holding rail, lower heels until stretch is felt in calf of legs. Hold 30 secs.  Repeat __3_ times. Do __3_ times per day.  Copyright  VHI. All rights reservedStraight Leg Raise   Tighten stomach and slowly raise locked right leg _6-8___ inches from floor. Repeat _10___ times per set. Do _3___ sets per session. Do ___2_ sessions per day. As this becomes easier , add weights.  Pt has 3 pound wts at home.  http://orth.exer.us/1103   Copyright  VHI. All rights reserved.  Trigger Point Dry Needling  . What is Trigger Point Dry Needling (DN)? o DN is a physical therapy technique used to treat muscle pain and dysfunction. Specifically, DN helps deactivate muscle trigger points (muscle knots).  o A thin filiform needle is used to penetrate the skin and stimulate the underlying trigger point. The goal is for a local twitch response (LTR) to occur and for the trigger point to relax. No medication of any kind is injected during the procedure.   . What Does Trigger Point Dry Needling Feel Like?  o The procedure feels different for  each individual patient. Some patients report that they do not actually feel the needle enter the skin and overall the process is not painful. Very mild bleeding may occur. However, many patients feel a deep cramping in the muscle in which the needle was inserted. This is the local twitch response.   Marland Kitchen How Will I feel after the treatment? o Soreness is normal, and the onset of soreness may not occur for a few hours. Typically this soreness does not last longer than two days.  o Bruising is uncommon, however; ice can be used to decrease any possible bruising.  o In rare cases feeling tired or nauseous after the treatment is normal. In addition, your symptoms may get worse before they get better, this period will typically not last longer than 24 hours.   . What Can I do After My Treatment? o Increase your hydration by drinking more water for the next 24 hours. o You may place ice or heat on the areas treated that have become sore, however, do not use heat on inflamed or bruised areas. Heat often brings more relief post needling. o You can continue your regular activities, but vigorous activity is not recommended initially after the treatment for 24 hours. o DN is best combined with other physical therapy such as strengthening, stretching, and other therapies.

## 2014-10-20 NOTE — Telephone Encounter (Signed)
appts made and printed...td 

## 2014-10-20 NOTE — Therapy (Addendum)
[place], [place], [address] Phone: [phone]   Fax:  [phone]  Physical Therapy Evaluation  Patient Details  Name: Mallory [name] MRN: [id number] Date of Birth: 1956/06/22 Referring Provider:  Mcarthur Rossetti*  Encounter Date: 10/20/2014      PT End of Session - 10/20/14 1751    Visit Number 1   Number of Visits 12   Date for PT Re-Evaluation 12/01/14   PT Start Time 1630   PT Stop Time 1730   PT Time Calculation (min) 60 min   Activity Tolerance Patient limited by pain   Behavior During Therapy [facility] for tasks assessed/performed      Past Medical History  Diagnosis Date  . Pulmonary embolism     5 yrs ago (unknown region)  . GERD (gastroesophageal reflux disease)     Past Surgical History  Procedure Laterality Date  . Cesarean section  1991    [facility]  . Cholecystectomy  15 yrs ago    [facility]  . Ercp w/ sphicterotomy  08/12/11    Dr Rehman-?microlithiasis, SOD  . Ercp  08/12/2011    Procedure: ENDOSCOPIC RETROGRADE CHOLANGIOPANCREATOGRAPHY (ERCP);  Surgeon: Rogene Houston, MD;  [facility];  Service: Endoscopy;  Laterality: N/A;  . Sphincterotomy  08/12/2011    Procedure: SPHINCTEROTOMY;  Surgeon: Rogene Houston, MD;  Location: [facility];  Service: Endoscopy;;  with ballon passage  . Esophagogastroduodenoscopy  11/25/2011    Procedure: ESOPHAGOGASTRODUODENOSCOPY (EGD);  Surgeon: Rogene Houston, MD;  [facility];  Service: Endoscopy;  Laterality: N/A;  830  . Eus  01/19/2012    Procedure: UPPER ENDOSCOPIC ULTRASOUND (EUS) LINEAR;  Surgeon: Milus Banister, MD;  [facility];  Service: Endoscopy;  Laterality: N/A;  pt moved up a hour early by AW , Patty @ [place] office ok'd / pt was called by AW  . Colonoscopy  05/24/2012    Procedure: COLONOSCOPY;  Surgeon: Rogene Houston, MD;  [facility];  Service: Endoscopy;  Laterality: N/A;  220    LMP  04/09/2011  Visit Diagnosis:  Pes anserinus bursitis of right knee - Plan: PT plan of care cert/re-cert  Plantar fasciitis, left - Plan: PT plan of care cert/re-cert  Muscle tightness - Plan: PT plan of care cert/re-cert      Subjective Assessment - 10/20/14 1640    Symptoms 59 yo with Left heel pain for past 2 weeks.  Pt did have plantar fasciitis in LEft heel and had cortisone injection in 06/2014.  Pt also has RT knee pain Pes bursitis and medial compartment narrowning and recieved cortisone to RT knee in 2 areas,  at RT latearal joint and  RT pes anserina.   Pertinent History Nothing remarkable according to patient   Patient Stated Goals To get pain controlled and learn exercises to help   Currently in Pain? Yes   Pain Score 7    better than it was   Pain Location Heel   Pain Orientation Left   Pain Descriptors / Indicators Burning;Sharp   Pain Type Acute pain   Pain Onset 1 to 4 weeks ago   Effect of Pain on Daily Activities slowed down at work , sits at [employer] at desk.  Slows down my walking.   Multiple Pain Sites Yes   Pain Score 3   Pain Type Acute pain   Pain Location Knee   Pain Orientation Right   Pain Descriptors / Indicators  Sore   Pain Frequency Intermittent   better since you had cortisone shot          [facility] PT Assessment - 10/20/14 1705    Assessment   Medical Diagnosis R knee pes bursitis and  Left foot plantar fasciitis   Balance Screen   Has the patient fallen in the past 6 months No   Has the patient had a decrease in activity level because of a fear of falling?  No   Is the patient reluctant to leave their home because of a fear of falling?  No   Home Environment   Living Enviornment Private residence   Living Arrangements Spouse/significant other   Type of [place] to enter   Entrance Stairs-Number of Steps 3   Prior Function   Level of Independence Independent with basic ADLs;Independent with homemaking with  ambulation   Observation/Other Assessments   Observations Pt with high arches, may benefit from high arch support.  Pt with a more prominent tibialis anterior distal tendon on R > L   Focus on Therapeutic Outcomes (FOTO)  --   Posture/Postural Control   Posture/Postural Control Postural limitations   Postural Limitations Increased lumbar lordosis;Anterior pelvic tilt;Rounded Shoulders;Forward head   Posture Comments Pt stands with wt bearing on lateral edge of feet   AROM   Right Knee Extension 0   Right Knee Flexion 132   Left Knee Extension 0   Left Knee Flexion 134   Right Ankle Dorsiflexion 10   Right Ankle Plantar Flexion 50   Right Ankle Inversion 45   Right Ankle Eversion 20   Left Ankle Dorsiflexion 10   Left Ankle Plantar Flexion 35   Left Ankle Inversion 50   Left Ankle Eversion 16   Strength   Overall Strength Within functional limits for tasks performed   Special Tests    Special Tests Foot Alignment   other    Comment --  Pt with high arches   Ambulation/Gait   Ambulation/Gait Yes   Ambulation/Gait Assistance 7: Independent   Gait Pattern Decreased step length - right;Decreased hip/knee flexion - right;Decreased hip/knee flexion - left;Decreased stance time - left  Pt walks with supinated foot gait   Balance   Balance Assessed Yes  Pt unable to tolerate  single limb stance on Left leg                  [facility] Adult PT Treatment/Exercise - 10/20/14 1705    Knee/Hip Exercises: Supine   Straight Leg Raises 10 reps   Moist Heat Therapy   Number Minutes Moist Heat 15 Minutes   Moist Heat Location Other (comment)  Left foot    Ankle Exercises: Stretches   Soleus Stretch 2 reps;30 seconds   Gastroc Stretch 3 reps;30 seconds   Other Stretch seated Dorsiflexion stretch with towel  2 x 30 seconds with knee straight          Trigger Point Dry Needling - 10/20/14 1803    Consent Given? Yes   Education Handout Provided Yes   Muscles Treated Lower Body  Quadratus plantae   Quadatus Plantae Response Twitch response elicited              PT Education - 10/20/14 1750    Education provided Yes   Education Details Initial HEP. Information on DRy needling   Person(s) Educated Patient   Methods Explanation;Demonstration;Handout;Verbal cues;Tactile cues   Comprehension Verbalized understanding;Returned demonstration;Need further instruction  PT Short Term Goals - 10/20/14 1754    PT SHORT TERM GOAL #1   Title Pt will be independent with initial HEP   Time 2   Period Weeks   Status New   PT SHORT TERM GOAL #2   Title Pt will reduce L plantar fasciitis to 5/10     Time 2   Period Weeks   Status New   PT SHORT TERM GOAL #3   Title Take LEFS next visit and make goal   Time 2   Period Weeks   Status New           PT Long Term Goals - 10/20/14 1833    PT LONG TERM GOAL #3   Title improve LEFS score from 2nd visit by at least 25%   Time 6   Period Weeks   Status New               Plan - 10/20/14 1647    Clinical Impression Statement 59 yo with L plantar fasciitis since September but recently exacerbated in last 2 weeks and on 10/17/2014 pt stepped off curb and reinjured  Left ankle.  Pt also with RT knee pain pes bursitis and medial compartment narrowing. of R knee. Pt with hip flexor/hamstring tightness and would benefit from HEP.  Pt also may benefit from trigger point dry needling and iontophoresis patche   Pt will benefit from skilled therapeutic intervention in order to improve on the following deficits Abnormal gait;Decreased activity tolerance;Decreased range of motion;Decreased strength;Increased fascial restricitons;Increased edema;Impaired flexibility;Pain;Postural dysfunction;Improper body mechanics   Rehab Potential Good   PT Frequency 2x / week   PT Duration 6 weeks   PT Next Visit Plan Continue with Hamstring stretching, Hip flexor stretch and assess initial HEP for gastroc. Assess effectiveness  of dry needling. Progeress knee strengthening   PT Home Exercise Plan Initial HEP given   Consulted and Agree with Plan of Care Patient       By signing I understand that I am ordering/authorizing the use of Iontophoresis using 4 mg/mL of dexamethasone as a component of this plan of care.   Problem List Patient Active Problem List   Diagnosis Date Noted  . IBS (irritable bowel syndrome) 04/26/2012  . Abdominal pain, acute, periumbilical 82/50/0370  . Duodenal perforation 08/14/2011  . GERD (gastroesophageal reflux disease) 08/14/2011  . PE (pulmonary embolism) 08/14/2011    Voncille Lo, PT 10/20/2014 6:38 PM Phone: [phone] Fax: [facility] [address] [place], [place], [address] Phone: [phone]   Fax:  [phone]

## 2014-10-21 ENCOUNTER — Ambulatory Visit: Payer: Managed Care, Other (non HMO) | Admitting: Internal Medicine

## 2014-10-28 ENCOUNTER — Ambulatory Visit: Payer: Managed Care, Other (non HMO) | Admitting: Physical Therapy

## 2014-10-28 DIAGNOSIS — M722 Plantar fascial fibromatosis: Secondary | ICD-10-CM | POA: Diagnosis not present

## 2014-10-28 DIAGNOSIS — M6289 Other specified disorders of muscle: Secondary | ICD-10-CM

## 2014-10-29 NOTE — Therapy (Signed)
[place], [place], [address] Phone: [phone]   Fax:  [phone]  Physical Therapy Treatment  Patient Details  Name: Mallory [name] MRN: [id number] Date of Birth: 1956/02/12 Referring Provider:  Aletha Halim., PA-C  Encounter Date: 10/28/2014      PT End of Session - 10/28/14 1700    Visit Number 2   Number of Visits 12   Date for PT Re-Evaluation 12/01/14   PT Start Time 1324   PT Stop Time 4010   PT Time Calculation (min) 40 min   Activity Tolerance Patient tolerated treatment well;Patient limited by pain      Past Medical History  Diagnosis Date  . Pulmonary embolism     5 yrs ago (unknown region)  . GERD (gastroesophageal reflux disease)     Past Surgical History  Procedure Laterality Date  . Cesarean section  1991    mc  . Cholecystectomy  15 yrs ago    [facility]  . Ercp w/ sphicterotomy  08/12/11    Dr Rehman-?microlithiasis, SOD  . Ercp  08/12/2011    Procedure: ENDOSCOPIC RETROGRADE CHOLANGIOPANCREATOGRAPHY (ERCP);  Surgeon: Rogene Houston, MD;  [facility]: [facility];  Service: Endoscopy;  Laterality: N/A;  . Sphincterotomy  08/12/2011    Procedure: SPHINCTEROTOMY;  Surgeon: Rogene Houston, MD;  [facility]: [facility];  Service: Endoscopy;;  with ballon passage  . Esophagogastroduodenoscopy  11/25/2011    Procedure: ESOPHAGOGASTRODUODENOSCOPY (EGD);  Surgeon: Rogene Houston, MD;  [facility];  Service: Endoscopy;  Laterality: N/A;  830  . Eus  01/19/2012    Procedure: UPPER ENDOSCOPIC ULTRASOUND (EUS) LINEAR;  Surgeon: Milus Banister, MD;  [facility];  Service: Endoscopy;  Laterality: N/A;  pt moved up a hour early by AW , Patty @ [place] office ok'd / pt was called by AW  . Colonoscopy  05/24/2012    Procedure: COLONOSCOPY;  Surgeon: Rogene Houston, MD;  [facility];  Service: Endoscopy;  Laterality: N/A;  220    LMP 04/09/2011  Visit Diagnosis:  Plantar  fasciitis, left  Muscle tightness      Subjective Assessment - 10/28/14 1636    Symptoms Rt knee 3/10, 7/10 Lt foot ,shin Cramping tightintermittant aching,  Better with cortizone.Ice heat help   Currently in Pain? Yes  see above   Multiple Pain Sites Yes  see above.                              PT Short Term Goals - 10/20/14 1754    PT SHORT TERM GOAL #1   Title Pt will be independent with initial HEP   Time 2   Period Weeks   Status New   PT SHORT TERM GOAL #2   Title Pt will reduce L plantar fasciitis to 5/10     Time 2   Period Weeks   Status New   PT SHORT TERM GOAL #3   Title Take LEFS next visit and make goal   Time 2   Period Weeks   Status New           PT Long Term Goals - 10/20/14 1833    PT LONG TERM GOAL #3   Title improve LEFS score from 2nd visit by at least 25%   Time 6   Period Weeks   Status New  Plan - 10/29/14 0726    Clinical Impression Statement manual focus and stretching. No new goals met.   PT Home Exercise Plan check taping add to gastroc, stretch        Problem List Patient Active Problem List   Diagnosis Date Noted  . IBS (irritable bowel syndrome) 04/26/2012  . Abdominal pain, acute, periumbilical 94/58/5929  . Duodenal perforation 08/14/2011  . GERD (gastroesophageal reflux disease) 08/14/2011  . PE (pulmonary embolism) 08/14/2011  Melvenia Needles, PTA 10/29/2014 7:30 AM Phone: [phone] Fax: [phone]   Session focused on stretches and manual, lymph system  activation, stretches manually and taping peroneus inhibition and fan for edema control.  [facility] 10/29/2014, 7:30 AM  [facility] [address] [place], [place], [address] Phone: [phone]   Fax:  [phone]

## 2014-10-30 ENCOUNTER — Ambulatory Visit: Payer: Managed Care, Other (non HMO) | Admitting: Physical Therapy

## 2014-10-30 DIAGNOSIS — M6289 Other specified disorders of muscle: Secondary | ICD-10-CM

## 2014-10-30 DIAGNOSIS — M722 Plantar fascial fibromatosis: Secondary | ICD-10-CM

## 2014-10-30 DIAGNOSIS — M7051 Other bursitis of knee, right knee: Secondary | ICD-10-CM

## 2014-10-30 NOTE — Therapy (Signed)
[place], [place], [address] Phone: [phone]   Fax:  [phone]  Physical Therapy Treatment  Patient Details  Name: Mallory [name] MRN: [id number] Date of Birth: May 12, 1956 Referring Provider:  Aletha Halim., PA-C  Encounter Date: 10/30/2014      PT End of Session - 10/30/14 9937    Visit Number 3   Number of Visits 12   Date for PT Re-Evaluation 12/01/14   PT Start Time 1550   PT Stop Time 1640   PT Time Calculation (min) 50 min   Activity Tolerance Patient tolerated treatment well;Patient limited by pain      Past Medical History  Diagnosis Date  . Pulmonary embolism     5 yrs ago (unknown region)  . GERD (gastroesophageal reflux disease)     Past Surgical History  Procedure Laterality Date  . Cesarean section  1991    mc  . Cholecystectomy  15 yrs ago    [facility]  . Ercp w/ sphicterotomy  08/12/11    Dr Rehman-?microlithiasis, SOD  . Ercp  08/12/2011    Procedure: ENDOSCOPIC RETROGRADE CHOLANGIOPANCREATOGRAPHY (ERCP);  Surgeon: Rogene Houston, MD;  [facility]: [facility];  Service: Endoscopy;  Laterality: N/A;  . Sphincterotomy  08/12/2011    Procedure: SPHINCTEROTOMY;  Surgeon: Rogene Houston, MD;  [facility]: [facility];  Service: Endoscopy;;  with ballon passage  . Esophagogastroduodenoscopy  11/25/2011    Procedure: ESOPHAGOGASTRODUODENOSCOPY (EGD);  Surgeon: Rogene Houston, MD;  [facility];  Service: Endoscopy;  Laterality: N/A;  830  . Eus  01/19/2012    Procedure: UPPER ENDOSCOPIC ULTRASOUND (EUS) LINEAR;  Surgeon: Milus Banister, MD;  [facility];  Service: Endoscopy;  Laterality: N/A;  pt moved up a hour early by AW , Patty @ [place] office ok'd / pt was called by AW  . Colonoscopy  05/24/2012    Procedure: COLONOSCOPY;  Surgeon: Rogene Houston, MD;  [facility];  Service: Endoscopy;  Laterality: N/A;  220    LMP 04/09/2011  Visit Diagnosis:  Plantar  fasciitis, left  Muscle tightness  Pes anserinus bursitis of right knee      Subjective Assessment - 10/30/14 1552    Symptoms (p) Knee is doing well with only occasional sharp pain.  Heel pain on left.  Tingles in left foot since starting exercise.  I think needling eased pain some.  Wearing new insert as well which helps some too.     Currently in Pain? (p) Yes   Pain Score (p) 5    Pain Location (p) Heel   Pain Orientation (p) Left   Pain Descriptors / Indicators (p) Sharp   Pain Type (p) Acute pain   Aggravating Factors  (p) prolonged walking   Pain Relieving Factors (p) inserts, stretches, dry needling   Pain Score (p) 0                    [facility] Adult PT Treatment/Exercise - 10/30/14 1630    Exercises   Exercises Ankle   Moist Heat Therapy   Number Minutes Moist Heat 10 Minutes   Moist Heat Location --  Calf and plantar foot   Manual Therapy   Manual Therapy Joint mobilization;Myofascial release   Joint Mobilization --  Ankle talocrual P-A, dorsiflexion, inv/eversion grade 3 20x    Myofascial Release --  Soft tissue mobilization to gastroc/soleus   Ankle Exercises: Stretches   Plantar Fascia Stretch  3 reps;30 seconds  Seated   Gastroc Stretch --  Review HEP and corrected technique   Ankle Exercises: Seated   Other Seated Ankle Exercises --  Discussed bottle roll on arch fascia stretch          Trigger Point Dry Needling - 10/30/14 1646    Consent Given? Yes   Muscles Treated Lower Body Quadratus plantae   Quadatus Plantae Response Palpable increased muscle length;Twitch response elicited              PT Education - 10/30/14 1635    Education provided Yes   Education Details See patient instructions   Person(s) Educated Patient   Methods Explanation;Demonstration   Comprehension Verbalized understanding          PT Short Term Goals - 10/30/14 1643    PT SHORT TERM GOAL #1   Title Pt will be independent with initial HEP   Time 2    Period Weeks   Status Achieved   PT SHORT TERM GOAL #2   Title Pt will reduce L plantar fasciitis to 5/10     Status Achieved   PT SHORT TERM GOAL #3   Title Take LEFS next visit and make goal   Time 2   Period Weeks   Status On-going           PT Long Term Goals - 10/30/14 1644    PT LONG TERM GOAL #1   Title Pt will be able to walk without widened based compensatory gait for pain   Time 6   Period Weeks   Status On-going   PT LONG TERM GOAL #2   Title "Pain will decrease to less than 3/10 with all functional activities   Time 6   Period Weeks   Status On-going   PT LONG TERM GOAL #3   Title improve LEFS score from 2nd visit by at least 25%   Time 6   Period Weeks   Status On-going   PT LONG TERM GOAL #4   Title Pt will be able to perform single limb stance for 30 seconds on Left /Right LE in order to improve gait   Time 6   Period Weeks   Status On-going   PT LONG TERM GOAL #5   Title "Demonstrate and verbalize techniques to reduce the risk of re-injury including: lifting, posture, body mechanics and improve stance without compensatory movements   Time 6   Period Weeks   Status On-going               Plan - 10/30/14 1636    Clinical Impression Statement Patient reports some improvement from all interventions which is reflected in decreased pain score in both heel and knee.  Progressing slowly with goals secondary to chronicity of problem.  Patient states "it feels good" post treatment with  no tingling.     PT Next Visit Plan Do LEFS if not previously done for STGs. Assess response to dry needling and manual interventions.  Re-tape if helpful.  Right knee strengthening as tolerated        Problem List Patient Active Problem List   Diagnosis Date Noted  . IBS (irritable bowel syndrome) 04/26/2012  . Abdominal pain, acute, periumbilical 37/62/8315  . Duodenal perforation 08/14/2011  . GERD (gastroesophageal reflux disease) 08/14/2011  . PE  (pulmonary embolism) 08/14/2011    Alvera Singh 10/30/2014, 4:47 PM  [facility] [address] [place], [place], [address] Phone:  [phone]   Fax:  [place], PT 10/30/2014 4:48 PM Phone: [phone] Fax: [phone]

## 2014-10-30 NOTE — Patient Instructions (Signed)
Discussed seated plantar fascia stretch on edge of bed in morning and after prolonged sitting at work before standing up.  Discussed wearing house shoes to avoid barefoot.  Discussed water bottle fascia massage.

## 2014-11-04 ENCOUNTER — Ambulatory Visit: Payer: Managed Care, Other (non HMO) | Admitting: Physical Therapy

## 2014-11-04 DIAGNOSIS — M722 Plantar fascial fibromatosis: Secondary | ICD-10-CM

## 2014-11-04 DIAGNOSIS — M7051 Other bursitis of knee, right knee: Secondary | ICD-10-CM

## 2014-11-04 DIAGNOSIS — M6289 Other specified disorders of muscle: Secondary | ICD-10-CM

## 2014-11-04 NOTE — Therapy (Signed)
[place], [place], [address] Phone: [phone]   Fax:  [phone]  Physical Therapy Treatment  Patient Details  Name: Mallory [name] MRN: [id number] Date of Birth: 1955/12/20 Referring Provider:  Aletha Halim., PA-C  Encounter Date: 11/04/2014      PT End of Session - 11/04/14 1736    Visit Number 4   Number of Visits 12   Date for PT Re-Evaluation 12/01/14   PT Start Time 1633   PT Stop Time 1720   PT Time Calculation (min) 47 min   Activity Tolerance Patient tolerated treatment well;Patient limited by pain      Past Medical History  Diagnosis Date  . Pulmonary embolism     5 yrs ago (unknown region)  . GERD (gastroesophageal reflux disease)     Past Surgical History  Procedure Laterality Date  . Cesarean section  1991    mc  . Cholecystectomy  15 yrs ago    mc  . Ercp w/ sphicterotomy  08/12/11    Dr Rehman-?microlithiasis, SOD  . Ercp  08/12/2011    Procedure: ENDOSCOPIC RETROGRADE CHOLANGIOPANCREATOGRAPHY (ERCP);  Surgeon: Rogene Houston, MD;  [facility]: [facility];  Service: Endoscopy;  Laterality: N/A;  . Sphincterotomy  08/12/2011    Procedure: SPHINCTEROTOMY;  Surgeon: Rogene Houston, MD;  Location: [facility];  Service: Endoscopy;;  with ballon passage  . Esophagogastroduodenoscopy  11/25/2011    Procedure: ESOPHAGOGASTRODUODENOSCOPY (EGD);  Surgeon: Rogene Houston, MD;  [facility];  Service: Endoscopy;  Laterality: N/A;  830  . Eus  01/19/2012    Procedure: UPPER ENDOSCOPIC ULTRASOUND (EUS) LINEAR;  Surgeon: Milus Banister, MD;  [facility];  Service: Endoscopy;  Laterality: N/A;  pt moved up a hour early by AW , Patty @ [place] office ok'd / pt was called by AW  . Colonoscopy  05/24/2012    Procedure: COLONOSCOPY;  Surgeon: Rogene Houston, MD;  [facility];  Service: Endoscopy;  Laterality: N/A;  220    LMP 04/09/2011  Visit Diagnosis:  Plantar  fasciitis, left  Muscle tightness  Pes anserinus bursitis of right knee      Subjective Assessment - 11/04/14 1725    Symptoms Dry needel hurt to do but now my pain is a alot better.  Knee is not bothering her .  She is able to do anything she wants.  Occasional sharp pain medial knee.   Pain Score 4    Pain Location Heel   Pain Orientation Left   Pain Descriptors / Indicators Burning;Sharp   Pain Type Acute pain   Aggravating Factors  1st step after sitting, walking   Pain Relieving Factors taping, dry needle, stretched, arch supports.   Effect of Pain on Daily Activities limits walking   Multiple Pain Sites No                    [facility] Adult PT Treatment/Exercise - 11/04/14 1635    Knee/Hip Exercises: Stretches   Passive Hamstring Stretch 3 reps;30 seconds  added to home exercise   Manual Therapy   Manual Therapy Myofascial release;Passive ROM;Massage;Edema management  taping peroneal, gastroc inhibition, fan /edema   Myofascial Release foot, peroneal, now more movement   Ankle Exercises: Stretches   Plantar Fascia Stretch 3 reps;30 seconds  cues needed for duration   Gastroc Stretch 3 reps;30 seconds  cues needed for duration   Ankle Exercises: Standing   Heel  Raises 10 reps  neded to use 2 feet , 1 foot too painful.                PT Education - 11/04/14 1724    Education provided Yes   Education Details hamstrings   Person(s) Educated Patient   Methods Explanation;Demonstration;Handout   Comprehension Verbalized understanding;Returned demonstration          PT Short Term Goals - 11/04/14 1742    PT SHORT TERM GOAL #3   Title Take LEFS next visit and make goal   Time 2   Period Weeks   Status Achieved           PT Long Term Goals - 11/04/14 1744    PT LONG TERM GOAL #1   Title Pt will be able to walk without widened based compensatory gait for pain   Time 6   Period Weeks   Status On-going   PT LONG TERM GOAL #2   Title  "Pain will decrease to less than 3/10 with all functional activities   Time 6   Period Weeks   Status On-going   PT LONG TERM GOAL #3   Title improve LEFS score from 2nd visit by at least 25%   Time 6   Period Weeks   Status On-going   PT LONG TERM GOAL #4   Title Pt will be able to perform single limb stance for 30 seconds on Left /Right LE in order to improve gait   Time 6   Period Weeks   Status On-going               Plan - 11/04/14 1737    Clinical Impression Statement LEFS taken today :  total of 63 points.  pain much improved with PT.  Able to begin knee exercise for home with stretch to hamstring.  Less tight after session.   PT Next Visit Plan Rt knee strengthening,  check hamstring stretch (Sitting) Dry needling was helpful.   Consulted and Agree with Plan of Care Patient        Problem List Patient Active Problem List   Diagnosis Date Noted  . IBS (irritable bowel syndrome) 04/26/2012  . Abdominal pain, acute, periumbilical 55/73/2202  . Duodenal perforation 08/14/2011  . GERD (gastroesophageal reflux disease) 08/14/2011  . PE (pulmonary embolism) 08/14/2011   Melvenia Needles, PTA 11/04/2014 5:47 PM Phone: [phone] Fax: [phone]  [facility] 11/04/2014, [place] [facility] [address] [place], [place], [address] Phone: [phone]   Fax:  [phone]

## 2014-11-04 NOTE — Patient Instructions (Addendum)
Leg Extension (Hamstring)   Sit toward front edge of chair, with leg out straight, heel on floor, toes pointing toward body. Keeping back straight, bend forward at hip, breathing out through pursed lips. Return, breathing in. Repeat ___ times. Repeat with other leg. Do ___ sessions per day. Variation: Perform from standing position, with support.  Hamstring Step 1   Straighten left knee. Keep knee level with other knee or on bolster. Hold ___ seconds. Relax knee by returning foot to start. Repeat ___ times.  Hamstring Stretch   With other leg bent, foot flat, grasp right leg and slowly try to straighten knee. Hold ____ seconds. Repeat ____ times. Do ____ sessions per day.  http://gt2.exer.us/279   Hamstring Stretch (Standing)   Standing, place one heel on chair or bench. Use one or both hands on thigh for support. Keeping torso straight, lean forward slowly until a stretch is felt in back of same thigh. Hold ____ seconds. Repeat with other leg.  Hamstring Step 2   Left foot relaxed, knee straight, other leg bent, foot flat. Raise straight leg further upward to maximal range. Hold ___ seconds. Relax leg completely down. Repeat ___ times.  Hamstring Step 3   Left leg in maximal straight leg raise, heel at maximal stretch, straighten knee further by tightening knee cap. Warning: Intense stretch. Stay within tolerance. Hold ___ seconds. Relax knee cap only. Repeat ___ times.  Hamstring Step 4   Left leg and foot in maximal stretch. Slowly lengthen and press other leg down as close to floor as possible. Keep lower abdominals tight. Warning: Intense stretch. Stay within tolerance. Hold ___ seconds. Relax lengthened leg slightly. Do not re-bend knee. Repeat press and lengthen ___ times.  Hamstring Stretch, Reclined (Strap, Doorframe)   Lengthen bottom leg on floor. Extend top leg along edge of doorframe or press foot up into yoga strap. Hold for ____ breaths. Repeat ____  times each leg.  Stretching: Hamstring (Sitting)   With right leg straight, tuck other foot near groin. Reach down until stretch is felt in back of thigh. Keep back straight. Hold ___30_ seconds. Repeat _3___ times per set. Do _1-2___ sets per session. Do __2__ sessions per day.  http://orth.exer.us/661   Copyright  VHI. All rights reserved.  Hold most stretches 30 seconds, mild stretches vs hard stretches.

## 2014-11-06 ENCOUNTER — Ambulatory Visit: Payer: Managed Care, Other (non HMO) | Admitting: Physical Therapy

## 2014-11-06 DIAGNOSIS — M6289 Other specified disorders of muscle: Secondary | ICD-10-CM

## 2014-11-06 DIAGNOSIS — M722 Plantar fascial fibromatosis: Secondary | ICD-10-CM

## 2014-11-06 DIAGNOSIS — M7051 Other bursitis of knee, right knee: Secondary | ICD-10-CM

## 2014-11-06 NOTE — Therapy (Signed)
[place], [place], [address] Phone: [phone]   Fax:  [phone]  Physical Therapy Treatment  Patient Details  Name: Mallory [name] MRN: [id number] Date of Birth: 1956/01/11 Referring Provider:  Mcarthur Rossetti*  Encounter Date: 11/06/2014      PT End of Session - 11/06/14 1702    Visit Number 5   Number of Visits 12   Date for PT Re-Evaluation 12/01/14   PT Start Time 1630   PT Stop Time 1710   PT Time Calculation (min) 40 min      Past Medical History  Diagnosis Date  . Pulmonary embolism     5 yrs ago (unknown region)  . GERD (gastroesophageal reflux disease)     Past Surgical History  Procedure Laterality Date  . Cesarean section  1991    [facility]  . Cholecystectomy  15 yrs ago    [facility]  . Ercp w/ sphicterotomy  08/12/11    Dr Rehman-?microlithiasis, SOD  . Ercp  08/12/2011    Procedure: ENDOSCOPIC RETROGRADE CHOLANGIOPANCREATOGRAPHY (ERCP);  Surgeon: Rogene Houston, MD;  Location: [facility];  Service: Endoscopy;  Laterality: N/A;  . Sphincterotomy  08/12/2011    Procedure: SPHINCTEROTOMY;  Surgeon: Rogene Houston, MD;  Location: [facility];  Service: Endoscopy;;  with ballon passage  . Esophagogastroduodenoscopy  11/25/2011    Procedure: ESOPHAGOGASTRODUODENOSCOPY (EGD);  Surgeon: Rogene Houston, MD;  [facility];  Service: Endoscopy;  Laterality: N/A;  830  . Eus  01/19/2012    Procedure: UPPER ENDOSCOPIC ULTRASOUND (EUS) LINEAR;  Surgeon: Milus Banister, MD;  [facility];  Service: Endoscopy;  Laterality: N/A;  pt moved up a hour early by AW , Patty @ [place] office ok'd / pt was called by AW  . Colonoscopy  05/24/2012    Procedure: COLONOSCOPY;  Surgeon: Rogene Houston, MD;  [facility];  Service: Endoscopy;  Laterality: N/A;  220    LMP 04/09/2011  Visit Diagnosis:  Plantar fasciitis, left  Muscle tightness  Pes anserinus bursitis of right knee       Subjective Assessment - 11/06/14 1630    Symptoms Still doing better, not as swollen or tight.  Heel pain but no knee pain.   Currently in Pain? Yes   Pain Score 4    Pain Location Heel   Pain Orientation Left   Pain Type Acute pain   Aggravating Factors  1st thing in morning and if on it a lot   Pain Score 4                    [facility] Adult PT Treatment/Exercise - 11/06/14 1700    Moist Heat Therapy   Number Minutes Moist Heat 10 Minutes   Moist Heat Location --  calf and foot   Manual Therapy   Manual Therapy Myofascial release;Passive ROM;Massage;Edema management   Joint Mobilization --  Prone talocural PA, inv/ev; ankle DF with calcaneal distract          Trigger Point Dry Needling - 11/06/14 1702    Consent Given? Yes   Muscles Treated Lower Body Quadratus plantae  tibialis posterior   Quadatus Plantae Response Palpable increased muscle length        Left LE only         PT Short Term Goals - 11/06/14 1705    PT SHORT TERM GOAL #1   Title Pt will be independent with initial  HEP   Status Achieved   PT SHORT TERM GOAL #2   Title Pt will reduce L plantar fasciitis to 5/10     Status Achieved           PT Long Term Goals - 11/06/14 1705    PT LONG TERM GOAL #1   Title Pt will be able to walk without widened based compensatory gait for pain   Time 6   Period Weeks   Status On-going   PT LONG TERM GOAL #2   Title "Pain will decrease to less than 3/10 with all functional activities   Time 6   Period Weeks   Status On-going   PT LONG TERM GOAL #3   Title improve LEFS score from 2nd visit by at least 25%   Time 6   Period Weeks   Status On-going   PT LONG TERM GOAL #4   Title Pt will be able to perform single limb stance for 30 seconds on Left /Right LE in order to improve gait   Time 6   Period Weeks   Status On-going               Plan - 11/06/14 1703    Clinical Impression Statement Patient reports that even though  dry needling is quite painful, she feels she has gotten good pain relief overall.  Progressing with LTGs.  Continue 2x/week next week then may be able to decrease treatment frequency to 1x/week.     PT Next Visit Plan Continue with kinesiotaping, LE strengthening, stretching of plantar fascia, manual interventions, dry needling        Problem List Patient Active Problem List   Diagnosis Date Noted  . IBS (irritable bowel syndrome) 04/26/2012  . Abdominal pain, acute, periumbilical 89/38/1017  . Duodenal perforation 08/14/2011  . GERD (gastroesophageal reflux disease) 08/14/2011  . PE (pulmonary embolism) 08/14/2011    Alvera Singh 11/06/2014, 5:07 PM  [facility] [address] [place], [place], [address] Phone: [phone]   Fax:  [phone]  Ruben Im, PT 11/06/2014 5:08 PM Phone: [phone] Fax: [phone]

## 2014-11-11 ENCOUNTER — Ambulatory Visit: Payer: Managed Care, Other (non HMO) | Attending: Orthopaedic Surgery | Admitting: Physical Therapy

## 2014-11-11 DIAGNOSIS — M7051 Other bursitis of knee, right knee: Secondary | ICD-10-CM | POA: Insufficient documentation

## 2014-11-11 DIAGNOSIS — M722 Plantar fascial fibromatosis: Secondary | ICD-10-CM | POA: Diagnosis not present

## 2014-11-11 DIAGNOSIS — M6289 Other specified disorders of muscle: Secondary | ICD-10-CM

## 2014-11-11 DIAGNOSIS — G729 Myopathy, unspecified: Secondary | ICD-10-CM | POA: Diagnosis not present

## 2014-11-11 NOTE — Therapy (Signed)
[place], [place], [address] Phone: [phone]   Fax:  [phone]  Physical Therapy Treatment  Patient Details  Name: Mallory [name] MRN: [id number] Date of Birth: [birth year] Referring Provider:  Aletha Halim., PA-C  Encounter Date: 11/11/2014      PT End of Session - 11/11/14 6761    Visit Number 6   Number of Visits 12   Date for PT Re-Evaluation 12/01/14   PT Start Time 9509   PT Stop Time 1630   PT Time Calculation (min) 42 min   Activity Tolerance Patient tolerated treatment well      Past Medical History  Diagnosis Date  . Pulmonary embolism     5 yrs ago (unknown region)  . GERD (gastroesophageal reflux disease)     Past Surgical History  Procedure Laterality Date  . Cesarean section  1991    mc  . Cholecystectomy  15 yrs ago    mc  . Ercp w/ sphicterotomy  08/12/11    Dr Rehman-?microlithiasis, SOD  . Ercp  08/12/2011    Procedure: ENDOSCOPIC RETROGRADE CHOLANGIOPANCREATOGRAPHY (ERCP);  Surgeon: Rogene Houston, MD;  [facility]: [facility];  Service: Endoscopy;  Laterality: N/A;  . Sphincterotomy  08/12/2011    Procedure: SPHINCTEROTOMY;  Surgeon: Rogene Houston, MD;  [facility]: [facility];  Service: Endoscopy;;  with ballon passage  . Esophagogastroduodenoscopy  11/25/2011    Procedure: ESOPHAGOGASTRODUODENOSCOPY (EGD);  Surgeon: Rogene Houston, MD;  [facility];  Service: Endoscopy;  Laterality: N/A;  830  . Eus  01/19/2012    Procedure: UPPER ENDOSCOPIC ULTRASOUND (EUS) LINEAR;  Surgeon: Milus Banister, MD;  [facility];  Service: Endoscopy;  Laterality: N/A;  pt moved up a hour early by AW , Patty @ [place] office ok'd / pt was called by AW  . Colonoscopy  05/24/2012    Procedure: COLONOSCOPY;  Surgeon: Rogene Houston, MD;  [facility];  Service: Endoscopy;  Laterality: N/A;  220    LMP 04/09/2011  Visit Diagnosis:  Plantar fasciitis, left  Muscle  tightness  Pes anserinus bursitis of right knee      Subjective Assessment - 11/11/14 1548    Symptoms Sore this morning.  The needls work  Not painful.   Currently in Pain? No/denies   Pain Score 4    Pain Relieving Factors taping dry needle   Multiple Pain Sites No                    [facility] Adult PT Treatment/Exercise - 11/11/14 1600    Manual Therapy   Manual Therapy Myofascial release  plantar and peroneal   Joint Mobilization --  alcaneal, fibula, P/A glides metatarsals and mid foot gastro   Myofascial Release --  Taping continued as previous also included fascia taping of                   PT Short Term Goals - 11/06/14 1705    PT SHORT TERM GOAL #1   Title Pt will be independent with initial HEP   Status Achieved   PT SHORT TERM GOAL #2   Title Pt will reduce L plantar fasciitis to 5/10     Status Achieved           PT Long Term Goals - 11/11/14 1753    PT LONG TERM GOAL #1   Title Pt will be able to walk without widened based  compensatory gait for pain   Time 6   Status Unable to assess   PT LONG TERM GOAL #2   Title "Pain will decrease to less than 3/10 with all functional activities   Time 6   Period Weeks   Status On-going   PT LONG TERM GOAL #3   Title improve LEFS score from 2nd visit by at least 25%   Time 6   Period Weeks   Status Unable to assess   PT LONG TERM GOAL #4   Title Pt will be able to perform single limb stance for 30 seconds on Left /Right LE in order to improve gait   Time 6   Period Weeks   Status On-going   PT LONG TERM GOAL #5   Title "Demonstrate and verbalize techniques to reduce the risk of re-injury including: lifting, posture, body mechanics and improve stance without compensatory movements   Time 6   Period Weeks   Status Unable to assess               Plan - 11/11/14 1750    Clinical Impression Statement Focus today : manual.  Will try to wean from manual to closed chain exercise, she  is worried about decreased frequency.  Hip strengthening.   PT Next Visit Plan rockerboard,  closed chain   Consulted and Agree with Plan of Care Patient        Problem List Patient Active Problem List   Diagnosis Date Noted  . IBS (irritable bowel syndrome) 04/26/2012  . Abdominal pain, acute, periumbilical 17/40/8144  . Duodenal perforation 08/14/2011  . GERD (gastroesophageal reflux disease) 08/14/2011  . PE (pulmonary embolism) 08/14/2011   Melvenia Needles, PTA 11/11/2014 5:57 PM Phone: [phone] Fax: [phone]   [facility] 11/11/2014, 5:57 PM  [facility] [address] [place], [place], [address] Phone: [phone]   Fax:  [phone]

## 2014-11-13 ENCOUNTER — Ambulatory Visit: Payer: Managed Care, Other (non HMO) | Admitting: Physical Therapy

## 2014-11-13 DIAGNOSIS — M722 Plantar fascial fibromatosis: Secondary | ICD-10-CM

## 2014-11-13 DIAGNOSIS — M7051 Other bursitis of knee, right knee: Secondary | ICD-10-CM

## 2014-11-13 DIAGNOSIS — M6289 Other specified disorders of muscle: Secondary | ICD-10-CM

## 2014-11-13 NOTE — Therapy (Signed)
[place], [place], [address] Phone: [phone]   Fax:  [phone]  Physical Therapy Treatment  Patient Details  Name: Mallory [name] MRN: [id number] Date of Birth: Nov 28, 1955 Referring Provider:  Aletha Halim., PA-C  Encounter Date: 11/13/2014      PT End of Session - 11/13/14 1740    Visit Number 7   Number of Visits 12   Date for PT Re-Evaluation 12/01/14   PT Start Time 4650   PT Stop Time 1630   PT Time Calculation (min) 45 min   Activity Tolerance Patient tolerated treatment well      Past Medical History  Diagnosis Date  . Pulmonary embolism     5 yrs ago (unknown region)  . GERD (gastroesophageal reflux disease)     Past Surgical History  Procedure Laterality Date  . Cesarean section  1991    mc  . Cholecystectomy  15 yrs ago    mc  . Ercp w/ sphicterotomy  08/12/11    Dr Rehman-?microlithiasis, SOD  . Ercp  08/12/2011    Procedure: ENDOSCOPIC RETROGRADE CHOLANGIOPANCREATOGRAPHY (ERCP);  Surgeon: Rogene Houston, MD;  [facility]: [facility];  Service: Endoscopy;  Laterality: N/A;  . Sphincterotomy  08/12/2011    Procedure: SPHINCTEROTOMY;  Surgeon: Rogene Houston, MD;  [facility]: [facility];  Service: Endoscopy;;  with ballon passage  . Esophagogastroduodenoscopy  11/25/2011    Procedure: ESOPHAGOGASTRODUODENOSCOPY (EGD);  Surgeon: Rogene Houston, MD;  [facility];  Service: Endoscopy;  Laterality: N/A;  830  . Eus  01/19/2012    Procedure: UPPER ENDOSCOPIC ULTRASOUND (EUS) LINEAR;  Surgeon: Milus Banister, MD;  [facility];  Service: Endoscopy;  Laterality: N/A;  pt moved up a hour early by AW , Patty @ [place] office ok'd / pt was called by AW  . Colonoscopy  05/24/2012    Procedure: COLONOSCOPY;  Surgeon: Rogene Houston, MD;  [facility];  Service: Endoscopy;  Laterality: N/A;  220    LMP 04/09/2011  Visit Diagnosis:  Plantar fasciitis, left  Muscle  tightness  Pes anserinus bursitis of right knee      Subjective Assessment - 11/13/14 1542    Symptoms Good.  This morning a little sore.  Wonders if the tape might be making it sore.  The knee is a little tender for only a short time but it's been doing good.     Currently in Pain? Yes   Pain Score 2    Pain Location Foot   Pain Orientation Left   Pain Onset More than a month ago   Aggravating Factors  1st thing in the morning and getting up on it;  better as the day goes on.   Pain Relieving Factors needling   Pain Score 0   Pain Location Knee   Pain Orientation Right                    [facility] Adult PT Treatment/Exercise - 11/13/14 1736    Manual Therapy   Manual Therapy Myofascial release   Joint Mobilization Ankle talocrual PA, AP, inv/ev grade 3 30 sec 3x each   Myofascial Release Left gastroc,soleus,post tibialis   Ankle Exercises: Standing   SLS --  WB on right left 4 ways 10x   Other Standing Ankle Exercises --  right 4 inch step ups 10x with cues for PF align   Other Standing Ankle Exercises --  SLS to  avoid pelvic drop 4x 5-6 sec   Ankle Exercises: Seated   Other Seated Ankle Exercises Seated quad sets  10x   Ankle Exercises: Supine   Other Supine Ankle Exercises --  SLR 10x right   Ankle Exercises: Sidelying   Other Sidelying Ankle Exercises clams right 15x                PT Education - 11/13/14 1739    Education provided Yes   Education Details clams; SLS with 4 ways (WB on right)   Person(s) Educated Patient   Methods Explanation;Demonstration;Handout   Comprehension Verbalized understanding;Returned demonstration          PT Short Term Goals - 11/13/14 1746    PT SHORT TERM GOAL #1   Title Pt will be independent with initial HEP   Status Achieved   PT SHORT TERM GOAL #2   Title Pt will reduce L plantar fasciitis to 5/10     Status Achieved   PT SHORT TERM GOAL #3   Title Take LEFS next visit and make goal   Status  Achieved           PT Long Term Goals - 11/13/14 1746    PT LONG TERM GOAL #1   Title Pt will be able to walk without widened based compensatory gait for pain   Time 6   Status Achieved   PT LONG TERM GOAL #2   Title "Pain will decrease to less than 3/10 with all functional activities   Time 6   Period Weeks   Status Partially Met   PT LONG TERM GOAL #3   Title improve LEFS score from 2nd visit by at least 25%   Time 6   Period Weeks   Status On-going   PT LONG TERM GOAL #4   Title Pt will be able to perform single limb stance for 30 seconds on Left /Right LE in order to improve gait   Time 6   Period Weeks   Status On-going   PT LONG TERM GOAL #5   Title "Demonstrate and verbalize techniques to reduce the risk of re-injury including: lifting, posture, body mechanics and improve stance without compensatory movements   Time 6   Period Weeks   Status On-going               Plan - 11/13/14 1742    Clinical Impression Statement Patient declines the need for dry needling today stating it is doing much better.  Treatment focus on right LE strengthening of quads and gluteals in closed chain positioning primarily.  Therapist monitoring for pain and cueing for patellofemoral alignment and to avoid excessive knee hyperextension.  Progressing with goals.     PT Next Visit Plan rockerboard,  closed chain for right and left LE strengthening;  dry needling PRN;  re-tape as needed        Problem List Patient Active Problem List   Diagnosis Date Noted  . IBS (irritable bowel syndrome) 04/26/2012  . Abdominal pain, acute, periumbilical 86/75/4492  . Duodenal perforation 08/14/2011  . GERD (gastroesophageal reflux disease) 08/14/2011  . PE (pulmonary embolism) 08/14/2011    Alvera Singh 11/13/2014, 5:48 PM  [facility] [address] [place], [place], [address] Phone: [phone]   Fax:  [phone]  Ruben Im,  PT 11/13/2014 5:49 PM Phone: [phone] Fax: [phone]

## 2014-11-13 NOTE — Patient Instructions (Signed)
Standing on right leg:  Move left leg 4 directions 10x;  Practice standing on right leg in front of mirror.  Watch out for pelvic drop.   Abduction: Clam (Eccentric) - Side-Lying   Lie on side with knees bent. Lift top knee, keeping feet together. Keep trunk steady. Slowly lower for 3-5 seconds. __15_ reps per set, __1_ sets per day, _7__ days per week. Add ___ lbs when you achieve ___ repetitions.  Copyright  VHI. All rights reserved.

## 2014-11-19 ENCOUNTER — Ambulatory Visit: Payer: Managed Care, Other (non HMO) | Admitting: Physical Therapy

## 2014-11-19 DIAGNOSIS — M7051 Other bursitis of knee, right knee: Secondary | ICD-10-CM

## 2014-11-19 DIAGNOSIS — M722 Plantar fascial fibromatosis: Secondary | ICD-10-CM

## 2014-11-19 DIAGNOSIS — M6289 Other specified disorders of muscle: Secondary | ICD-10-CM

## 2014-11-19 NOTE — Therapy (Signed)
[place], [place], [address] Phone: [phone]   Fax:  [phone]  Physical Therapy Treatment  Patient Details  Name: Mallory [name] MRN: [id number] Date of Birth: 07-21-1956 Referring Provider:  Aletha Halim., PA-C  Encounter Date: 11/19/2014      PT End of Session - 11/19/14 1648    Visit Number 8   Number of Visits 12   Date for PT Re-Evaluation 12/01/14   PT Start Time 8182   PT Stop Time 1633   PT Time Calculation (min) 44 min   Activity Tolerance Patient tolerated treatment well;Patient limited by pain      Past Medical History  Diagnosis Date  . Pulmonary embolism     5 yrs ago (unknown region)  . GERD (gastroesophageal reflux disease)     Past Surgical History  Procedure Laterality Date  . Cesarean section  1991    mc  . Cholecystectomy  15 yrs ago    mc  . Ercp w/ sphicterotomy  08/12/11    Dr Rehman-?microlithiasis, SOD  . Ercp  08/12/2011    Procedure: ENDOSCOPIC RETROGRADE CHOLANGIOPANCREATOGRAPHY (ERCP);  Surgeon: Rogene Houston, MD;  [facility]: [facility];  Service: Endoscopy;  Laterality: N/A;  . Sphincterotomy  08/12/2011    Procedure: SPHINCTEROTOMY;  Surgeon: Rogene Houston, MD;  [facility]: [facility];  Service: Endoscopy;;  with ballon passage  . Esophagogastroduodenoscopy  11/25/2011    Procedure: ESOPHAGOGASTRODUODENOSCOPY (EGD);  Surgeon: Rogene Houston, MD;  [facility];  Service: Endoscopy;  Laterality: N/A;  830  . Eus  01/19/2012    Procedure: UPPER ENDOSCOPIC ULTRASOUND (EUS) LINEAR;  Surgeon: Milus Banister, MD;  [facility];  Service: Endoscopy;  Laterality: N/A;  pt moved up a hour early by AW , Patty @ [place] office ok'd / pt was called by AW  . Colonoscopy  05/24/2012    Procedure: COLONOSCOPY;  Surgeon: Rogene Houston, MD;  [facility];  Service: Endoscopy;  Laterality: N/A;  220    LMP 04/09/2011  Visit Diagnosis:  Plantar  fasciitis, left  Muscle tightness  Pes anserinus bursitis of right knee      Subjective Assessment - 11/19/14 1600    Symptoms Knee is 5/10.  Thinks it may be the weight bearing straight leg irritates.  Foot sore  5/10.   Currently in Pain? Yes   Pain Score 5    Pain Location Foot  heel   Pain Orientation Left   Pain Descriptors / Indicators --  sore   Aggravating Factors  weightbearing   Pain Relieving Factors toe stretches, dry needling, wearing supportive shoes rebock.   Effect of Pain on Daily Activities limits walking    Multiple Pain Sites Yes   Pain Score 5   Pain Location Knee   Pain Orientation Right   Pain Radiating Towards wakes her up every once in awhile.  Recently sore about Sat. Sun.  It had not been hurting her   Pain Frequency Constant                    [facility] Adult PT Treatment/Exercise - 11/19/14 1549    Knee/Hip Exercises: Stretches   Passive Hamstring Stretch 3 reps;30 seconds   Knee/Hip Exercises: Seated   Long Arc Quad Both;10 reps   Other Seated Knee Exercises green band knee flexion, 2 sets , 10 reps   Ankle Exercises: Standing   SLS 5 reps 5 to  10 seconds each   Rocker Board 1 minute  close stand by.    Other Standing Ankle Exercises 4 inched step up with previous cues, when manually blocked hip , knee pain increased.   Other Standing Ankle Exercises Single leg stand with SLR 3 way, painful knee, also with sliding pillowcase on floor 3 way 5 reps each.     Ankle Exercises: Sidelying   Other Sidelying Ankle Exercises bilateral clams with instruction 10 reps    Ankle Exercises: Aerobic   Stationary Bike Nu steo 6 minutes level 5                 PT Education - 11/19/14 1648    Education provided Yes   Education Details re issued hamstring streches.  Very tight bilateral   Person(s) Educated Patient   Methods Explanation;Handout   Comprehension Verbalized understanding;Returned demonstration          PT Short Term  Goals - 11/13/14 1746    PT SHORT TERM GOAL #1   Title Pt will be independent with initial HEP   Status Achieved   PT SHORT TERM GOAL #2   Title Pt will reduce L plantar fasciitis to 5/10     Status Achieved   PT SHORT TERM GOAL #3   Title Take LEFS next visit and make goal   Status Achieved           PT Long Term Goals - 11/19/14 1651    PT LONG TERM GOAL #1   Title Pt will be able to walk without widened based compensatory gait for pain   Time 6   Period Weeks   Status Achieved   PT LONG TERM GOAL #2   Title "Pain will decrease to less than 3/10 with all functional activities   Time 6   Period Weeks   Status On-going   PT LONG TERM GOAL #3   Title improve LEFS score from 2nd visit by at least 25%   Time 6   Status Unable to assess   PT LONG TERM GOAL #4   Title Pt will be able to perform single limb stance for 30 seconds on Left /Right LE in order to improve gait   Baseline 10 best   Time 6   Period Weeks   Status On-going   PT LONG TERM GOAL #5   Title "Demonstrate and verbalize techniques to reduce the risk of re-injury including: lifting, posture, body mechanics and improve stance without compensatory movements   Time 6   Status Unable to assess   Additional Long Term Goals   Additional Long Term Goals Yes               Plan - 11/19/14 1649    Clinical Impression Statement patient wished she had dry needle last visit. knee sore from exercise , but today knee pain decreased from 5/10 to 3/10. with exercise only.  She declined taping, it bothered her heel last time   PT Next Visit Plan rockerboard,  closed chain for right and left LE strengthening;  dry needling PRN;  Hip strengthening        Problem List Patient Active Problem List   Diagnosis Date Noted  . IBS (irritable bowel syndrome) 04/26/2012  . Abdominal pain, acute, periumbilical 90/30/0923  . Duodenal perforation 08/14/2011  . GERD (gastroesophageal reflux disease) 08/14/2011  . PE  (pulmonary embolism) 08/14/2011    Vivyan Biggers 11/19/2014, 4:54 PM Melvenia Needles, PTA 11/19/2014 4:54 PM Phone: [phone]  Fax: [facility] [address] [place], [place], [address] Phone: [phone]   Fax:  [phone]

## 2014-11-21 ENCOUNTER — Ambulatory Visit: Payer: Managed Care, Other (non HMO) | Admitting: Physical Therapy

## 2014-11-25 ENCOUNTER — Ambulatory Visit: Payer: Managed Care, Other (non HMO) | Admitting: Physical Therapy

## 2014-11-25 DIAGNOSIS — M6289 Other specified disorders of muscle: Secondary | ICD-10-CM

## 2014-11-25 DIAGNOSIS — M722 Plantar fascial fibromatosis: Secondary | ICD-10-CM | POA: Diagnosis not present

## 2014-11-25 NOTE — Patient Instructions (Signed)

## 2014-11-25 NOTE — Therapy (Signed)
[place], [place], [address] Phone: [phone]   Fax:  [phone]  Physical Therapy Treatment  Patient Details  Name: Mallory [name] MRN: [id number] Date of Birth: [birth year] Referring Provider:  Aletha Halim., PA-C  Encounter Date: 11/25/2014      PT End of Session - 11/25/14 1632    Visit Number 9   Number of Visits 12   Date for PT Re-Evaluation 12/01/14   PT Start Time 5638   PT Stop Time 1630   PT Time Calculation (min) 45 min   Activity Tolerance Patient tolerated treatment well      Past Medical History  Diagnosis Date  . Pulmonary embolism     5 yrs ago (unknown region)  . GERD (gastroesophageal reflux disease)     Past Surgical History  Procedure Laterality Date  . Cesarean section  1991    [facility]  . Cholecystectomy  15 yrs ago    [facility]  . Ercp w/ sphicterotomy  08/12/11    Dr Rehman-?microlithiasis, SOD  . Ercp  08/12/2011    Procedure: ENDOSCOPIC RETROGRADE CHOLANGIOPANCREATOGRAPHY (ERCP);  Surgeon: Rogene Houston, MD;  [facility];  Service: Endoscopy;  Laterality: N/A;  . Sphincterotomy  08/12/2011    Procedure: SPHINCTEROTOMY;  Surgeon: Rogene Houston, MD;  Location: [facility];  Service: Endoscopy;;  with ballon passage  . Esophagogastroduodenoscopy  11/25/2011    Procedure: ESOPHAGOGASTRODUODENOSCOPY (EGD);  Surgeon: Rogene Houston, MD;  [facility];  Service: Endoscopy;  Laterality: N/A;  830  . Eus  01/19/2012    Procedure: UPPER ENDOSCOPIC ULTRASOUND (EUS) LINEAR;  Surgeon: Milus Banister, MD;  [facility];  Service: Endoscopy;  Laterality: N/A;  pt moved up a hour early by AW , Patty @ [place] office ok'd / pt was called by AW  . Colonoscopy  05/24/2012    Procedure: COLONOSCOPY;  Surgeon: Rogene Houston, MD;  [facility];  Service: Endoscopy;  Laterality: N/A;  220    LMP 04/09/2011  Visit Diagnosis:  Plantar fasciitis, left  Muscle  tightness      Subjective Assessment - 11/25/14 1545    Symptoms 4/10 heel Lt, no knee pain     Currently in Pain? Yes   Pain Score 4    Pain Location Heel   Pain Orientation Left   Pain Descriptors / Indicators --  stings   Pain Type Chronic pain   Aggravating Factors  worse when she gets up in am.   Pain Relieving Factors stretches, dry needling, good shoes,  inserts   Effect of Pain on Daily Activities walking painful.   Multiple Pain Sites No                    [facility] Adult PT Treatment/Exercise - 11/25/14 1558    Knee/Hip Exercises: Stretches   Passive Hamstring Stretch 3 reps;30 seconds   Gastroc Stretch 3 reps;30 seconds   Knee/Hip Exercises: Standing   Forward Step Up 10 reps;Step Height: 4"   Rocker Board 1 minute  2 feet   Knee/Hip Exercises: Seated   Other Seated Knee Exercises sit to stand. cues   Ankle Exercises: Aerobic   Stationary Bike 6 minutes   Ankle Exercises: Stretches   Plantar Fascia Stretch 3 reps;30 seconds  at wall    Gastroc Stretch Other (comment);1 rep   Ankle Exercises: Machines for Strengthening   Cybex Leg Press 1 plate 3 sets of  10 , 2 legs   Ankle Exercises: Standing   SLS --  12 seconds RT, 18 seconds LT leg                PT Education - 11/25/14 1608    Education Details Body mechanics for ADL and lifting          PT Short Term Goals - 11/13/14 1746    PT SHORT TERM GOAL #1   Title Pt will be independent with initial HEP   Status Achieved   PT SHORT TERM GOAL #2   Title Pt will reduce L plantar fasciitis to 5/10     Status Achieved   PT SHORT TERM GOAL #3   Title Take LEFS next visit and make goal   Status Achieved           PT Long Term Goals - 11/25/14 1638    PT LONG TERM GOAL #2   Title "Pain will decrease to less than 3/10 with all functional activities   Time 6   Period Weeks   Status On-going   PT LONG TERM GOAL #3   Title improve LEFS score from 2nd visit by at least 25%   Time 6    Period Weeks   Status Unable to assess   PT LONG TERM GOAL #4   Title Pt will be able to perform single limb stance for 30 seconds on Left /Right LE in order to improve gait   Time 6   Status On-going   PT LONG TERM GOAL #5   Title "Demonstrate and verbalize techniques to reduce the risk of re-injury including: lifting, posture, body mechanics and improve stance without compensatory movements   Time 6   Period Weeks   Status Achieved               Plan - 11/25/14 1633    Clinical Impression Statement Knee pain none, no heel pain at end of session.  Able to do more closed chain exercises.     PT Next Visit Plan rockerboard,  closed chain for right and left LE strengthening;  dry needling PRN;  Hip strengthening   Consulted and Agree with Plan of Care Patient        Problem List Patient Active Problem List   Diagnosis Date Noted  . IBS (irritable bowel syndrome) 04/26/2012  . Abdominal pain, acute, periumbilical 73/41/9379  . Duodenal perforation 08/14/2011  . GERD (gastroesophageal reflux disease) 08/14/2011  . PE (pulmonary embolism) 08/14/2011   Melvenia Needles, PTA 11/25/2014 4:40 PM Phone: [phone] Fax: [phone]  [facility] 11/25/2014, 4:40 PM  [facility] [address] [place], [place], [address] Phone: [phone]   Fax:  [phone]

## 2014-11-27 ENCOUNTER — Ambulatory Visit: Payer: Managed Care, Other (non HMO) | Admitting: Physical Therapy

## 2014-11-27 DIAGNOSIS — M6289 Other specified disorders of muscle: Secondary | ICD-10-CM

## 2014-11-27 DIAGNOSIS — M722 Plantar fascial fibromatosis: Secondary | ICD-10-CM

## 2014-11-27 NOTE — Therapy (Signed)
[place], [place], [address] Phone: [phone]   Fax:  [phone]  Physical Therapy Treatment/Recertification  Patient Details  Name: Mallory [name] MRN: [id number] Date of Birth: 1955-11-08 Referring Provider:  Mcarthur Rossetti*  Encounter Date: 11/27/2014      PT End of Session - 11/27/14 1714    Visit Number 10   Number of Visits 18   Date for PT Re-Evaluation 12/25/14   PT Start Time 1630   PT Stop Time 1721   PT Time Calculation (min) 51 min   Activity Tolerance Patient tolerated treatment well      Past Medical History  Diagnosis Date  . Pulmonary embolism     5 yrs ago (unknown region)  . GERD (gastroesophageal reflux disease)     Past Surgical History  Procedure Laterality Date  . Cesarean section  1991    mc  . Cholecystectomy  15 yrs ago    mc  . Ercp w/ sphicterotomy  08/12/11    Dr Rehman-?microlithiasis, SOD  . Ercp  08/12/2011    Procedure: ENDOSCOPIC RETROGRADE CHOLANGIOPANCREATOGRAPHY (ERCP);  Surgeon: Rogene Houston, MD;  [facility]: [facility];  Service: Endoscopy;  Laterality: N/A;  . Sphincterotomy  08/12/2011    Procedure: SPHINCTEROTOMY;  Surgeon: Rogene Houston, MD;  [facility]: [facility];  Service: Endoscopy;;  with ballon passage  . Esophagogastroduodenoscopy  11/25/2011    Procedure: ESOPHAGOGASTRODUODENOSCOPY (EGD);  Surgeon: Rogene Houston, MD;  [facility];  Service: Endoscopy;  Laterality: N/A;  830  . Eus  01/19/2012    Procedure: UPPER ENDOSCOPIC ULTRASOUND (EUS) LINEAR;  Surgeon: Milus Banister, MD;  [facility];  Service: Endoscopy;  Laterality: N/A;  pt moved up a hour early by AW , Patty @ [place] office ok'd / pt was called by AW  . Colonoscopy  05/24/2012    Procedure: COLONOSCOPY;  Surgeon: Rogene Houston, MD;  [facility];  Service: Endoscopy;  Laterality: N/A;  220    LMP 04/09/2011  Visit Diagnosis:  Plantar  fasciitis, left - Plan: PT plan of care cert/re-cert  Muscle tightness - Plan: PT plan of care cert/re-cert      Subjective Assessment - 11/27/14 1634    Symptoms 2-3/10 left heel; no knee pain   Currently in Pain? Yes   Pain Score 3    Pain Location Heel   Aggravating Factors  Worse in the AM. and late at night   Pain Relieving Factors needling           [facility] PT Assessment - 11/27/14 1723    Observation/Other Assessments   Other Surveys  --  LEFS improved from 63% to 66%                  [facility] Adult PT Treatment/Exercise - 11/27/14 1711    Moist Heat Therapy   Number Minutes Moist Heat 12 Minutes   Moist Heat Location --  Right calf and foot   Manual Therapy   Manual Therapy Myofascial release   Joint Mobilization Subtalar rearfoot distract; talocrual AP/PA; inversion/ev grade 3/4 3x 30 sec   Myofascial Release Gastroc, soleus and plantar fascia          Trigger Point Dry Needling - 11/27/14 1714    Consent Given? Yes   Muscles Treated Lower Body Quadratus plantae   Quadatus Plantae Response Palpable increased muscle length        Right side only.  PT Short Term Goals - 11/27/14 1725    PT SHORT TERM GOAL #1   Title Pt will be independent with initial HEP   Status Achieved   PT SHORT TERM GOAL #2   Title Pt will reduce L plantar fasciitis to 5/10     Status Achieved   PT SHORT TERM GOAL #3   Title Take LEFS next visit and make goal   Status Achieved           PT Long Term Goals - 11/27/14 1725    PT LONG TERM GOAL #1   Title Pt will be able to walk without widened based compensatory gait for pain   Status Achieved   PT LONG TERM GOAL #2   Title "Pain will decrease to less than 3/10 with all functional activities   Time 6   Period Weeks   Status Partially Met   PT LONG TERM GOAL #3   Title improve LEFS score from 2nd visit by at least 25%   Time 6   Period Weeks   Status On-going   PT LONG TERM GOAL #4   Title Pt will  be able to perform single limb stance for 30 seconds on Left /Right LE in order to improve gait   Baseline 10 best   Time 6   Period Weeks   Status On-going   PT LONG TERM GOAL #5   Title "Demonstrate and verbalize techniques to reduce the risk of re-injury including: lifting, posture, body mechanics and improve stance without compensatory movements   Status Achieved               Plan - 11/27/14 1718    Clinical Impression Statement LEFS functional outcome score improved from 63% to 66%.  Patient notes good improvement in pain with a combination of dry needling, manual therapy, taping, stretching and strengthening.  Progressing with goals and should meet remaining goals in next 2-4 weeks.     PT Next Visit Plan rockerboard,  closed chain for right and left LE strengthening;  dry needling PRN;  Hip strengthening; manual therapy; taping PRN        Problem List Patient Active Problem List   Diagnosis Date Noted  . IBS (irritable bowel syndrome) 04/26/2012  . Abdominal pain, acute, periumbilical 57/84/6962  . Duodenal perforation 08/14/2011  . GERD (gastroesophageal reflux disease) 08/14/2011  . PE (pulmonary embolism) 08/14/2011    Alvera Singh 11/27/2014, 5:29 PM  [facility] [address] [place], [place], [address] Phone: [phone]   Fax:  [phone] Ruben Im, PT 11/27/2014 5:29 PM Phone: [phone] Fax: [phone]

## 2014-12-02 ENCOUNTER — Ambulatory Visit: Payer: Managed Care, Other (non HMO) | Admitting: Physical Therapy

## 2014-12-02 DIAGNOSIS — M722 Plantar fascial fibromatosis: Secondary | ICD-10-CM

## 2014-12-02 DIAGNOSIS — M7051 Other bursitis of knee, right knee: Secondary | ICD-10-CM

## 2014-12-02 DIAGNOSIS — M6289 Other specified disorders of muscle: Secondary | ICD-10-CM

## 2014-12-02 NOTE — Therapy (Signed)
[place], [place], [address] Phone: [phone]   Fax:  [phone]  Physical Therapy Treatment  Patient Details  Name: Mallory [name] MRN: [id number] Date of Birth: [birth year] Referring Provider:  Aletha Halim., PA-C  Encounter Date: 12/02/2014      PT End of Session - 12/02/14 1729    Visit Number 11   Number of Visits 18   Date for PT Re-Evaluation 12/25/14   PT Start Time 0254   PT Stop Time 1638   PT Time Calculation (min) 53 min   Activity Tolerance Patient limited by pain      Past Medical History  Diagnosis Date  . Pulmonary embolism     5 yrs ago (unknown region)  . GERD (gastroesophageal reflux disease)     Past Surgical History  Procedure Laterality Date  . Cesarean section  1991    mc  . Cholecystectomy  15 yrs ago    mc  . Ercp w/ sphicterotomy  08/12/11    Dr Rehman-?microlithiasis, SOD  . Ercp  08/12/2011    Procedure: ENDOSCOPIC RETROGRADE CHOLANGIOPANCREATOGRAPHY (ERCP);  Surgeon: Rogene Houston, MD;  [facility]: [facility];  Service: Endoscopy;  Laterality: N/A;  . Sphincterotomy  08/12/2011    Procedure: SPHINCTEROTOMY;  Surgeon: Rogene Houston, MD;  [facility]: [facility];  Service: Endoscopy;;  with ballon passage  . Esophagogastroduodenoscopy  11/25/2011    Procedure: ESOPHAGOGASTRODUODENOSCOPY (EGD);  Surgeon: Rogene Houston, MD;  [facility];  Service: Endoscopy;  Laterality: N/A;  830  . Eus  01/19/2012    Procedure: UPPER ENDOSCOPIC ULTRASOUND (EUS) LINEAR;  Surgeon: Milus Banister, MD;  [facility];  Service: Endoscopy;  Laterality: N/A;  pt moved up a hour early by AW , Patty @ [place] office ok'd / pt was called by AW  . Colonoscopy  05/24/2012    Procedure: COLONOSCOPY;  Surgeon: Rogene Houston, MD;  [facility];  Service: Endoscopy;  Laterality: N/A;  220    LMP 04/09/2011  Visit Diagnosis:  Plantar fasciitis, left  Muscle  tightness  Pes anserinus bursitis of right knee      Subjective Assessment - 12/02/14 1547    Symptoms Sore in heel after walking 3 hours on Saturday.  Waking me up at night.  I feel back to square one.  Patient expresses frustration that she flares up.  Antalgic gait.  My knee hurts a little.     Currently in Pain? Yes   Pain Score 7    Pain Location Heel   Pain Orientation Left   Pain Descriptors / Indicators Aching   Pain Type Chronic pain   Pain Onset More than a month ago   Aggravating Factors  Night time, walking                    [facility] Adult PT Treatment/Exercise - 12/02/14 1722    Moist Heat Therapy   Number Minutes Moist Heat 12 Minutes   Moist Heat Location --  calf and heel/arch supine   Manual Therapy   Manual Therapy Myofascial release;Joint mobilization   Joint Mobilization Subtalar distract 3x; Talocrual AP and PA, inv/eversion   Myofascial Release gastroc, soleus, posterior tibialis          Trigger Point Dry Needling - 12/02/14 1728    Consent Given? Yes   Muscles Treated Lower Body Quadratus plantae;Gastrocnemius  tibialis posterior   Gastrocnemius Response Palpable increased muscle  length   Quadatus Plantae Response Palpable increased muscle length       Left only.         PT Short Term Goals - 12/02/14 1733    PT SHORT TERM GOAL #1   Title Pt will be independent with initial HEP   Status Achieved   PT SHORT TERM GOAL #2   Title Pt will reduce L plantar fasciitis to 5/10     Status Achieved   PT SHORT TERM GOAL #3   Title Take LEFS next visit and make goal   Status Achieved           PT Long Term Goals - 12/02/14 1733    PT LONG TERM GOAL #1   Title Pt will be able to walk without widened based compensatory gait for pain   Status Achieved   PT LONG TERM GOAL #2   Title "Pain will decrease to less than 3/10 with all functional activities   Time 6   Period Weeks   Status Partially Met   PT LONG TERM GOAL #3    Title improve LEFS score from 2nd visit by at least 25%   Time 6   Period Weeks   Status On-going   PT LONG TERM GOAL #4   Title Pt will be able to perform single limb stance for 30 seconds on Left /Right LE in order to improve gait   Baseline 10 best   Time 6   Period Weeks   Status On-going   PT LONG TERM GOAL #5   Title "Demonstrate and verbalize techniques to reduce the risk of re-injury including: lifting, posture, body mechanics and improve stance without compensatory movements   Status Achieved               Plan - 12/02/14 1730    Clinical Impression Statement Patient with a set back after shopping for 3 hours on Saturday.  Antalgic gait.  Patient with tender points in gastroc, tibialis posterior and quadruatus plantae muscles.  Post session soreness as expected.  Reassess in 2 days.     PT Next Visit Plan Reassess pain, continue dry needling, manual therapy; closed chain exercises        Problem List Patient Active Problem List   Diagnosis Date Noted  . IBS (irritable bowel syndrome) 04/26/2012  . Abdominal pain, acute, periumbilical 62/70/3500  . Duodenal perforation 08/14/2011  . GERD (gastroesophageal reflux disease) 08/14/2011  . PE (pulmonary embolism) 08/14/2011    Alvera Singh 12/02/2014, 5:34 PM  [facility] [address] [place], [place], [address] Phone: [phone]   Fax:  [phone]    Ruben Im, PT 12/02/2014 5:35 PM Phone: [phone] Fax: [phone]

## 2014-12-04 ENCOUNTER — Ambulatory Visit: Payer: Managed Care, Other (non HMO) | Admitting: Physical Therapy

## 2014-12-04 DIAGNOSIS — M722 Plantar fascial fibromatosis: Secondary | ICD-10-CM

## 2014-12-04 DIAGNOSIS — M7051 Other bursitis of knee, right knee: Secondary | ICD-10-CM

## 2014-12-04 DIAGNOSIS — M6289 Other specified disorders of muscle: Secondary | ICD-10-CM

## 2014-12-04 NOTE — Therapy (Signed)
[place], [place], [address] Phone: [phone]   Fax:  [phone]  Physical Therapy Treatment  Patient Details  Name: Mallory [name] MRN: [id number] Date of Birth: 1956-09-02 Referring Provider:  Aletha Halim., PA-C  Encounter Date: 12/04/2014      PT End of Session - 12/04/14 1904    Visit Number 12   Number of Visits 18   Date for PT Re-Evaluation 12/25/14   PT Start Time 8889   PT Stop Time 1694   PT Time Calculation (min) 46 min   Activity Tolerance Patient limited by pain      Past Medical History  Diagnosis Date  . Pulmonary embolism     5 yrs ago (unknown region)  . GERD (gastroesophageal reflux disease)     Past Surgical History  Procedure Laterality Date  . Cesarean section  1991    mc  . Cholecystectomy  15 yrs ago    mc  . Ercp w/ sphicterotomy  08/12/11    Dr Rehman-?microlithiasis, SOD  . Ercp  08/12/2011    Procedure: ENDOSCOPIC RETROGRADE CHOLANGIOPANCREATOGRAPHY (ERCP);  Surgeon: Rogene Houston, MD;  [facility]: [facility];  Service: Endoscopy;  Laterality: N/A;  . Sphincterotomy  08/12/2011    Procedure: SPHINCTEROTOMY;  Surgeon: Rogene Houston, MD;  Location: [facility];  Service: Endoscopy;;  with ballon passage  . Esophagogastroduodenoscopy  11/25/2011    Procedure: ESOPHAGOGASTRODUODENOSCOPY (EGD);  Surgeon: Rogene Houston, MD;  [facility];  Service: Endoscopy;  Laterality: N/A;  830  . Eus  01/19/2012    Procedure: UPPER ENDOSCOPIC ULTRASOUND (EUS) LINEAR;  Surgeon: Milus Banister, MD;  [facility];  Service: Endoscopy;  Laterality: N/A;  pt moved up a hour early by AW , Patty @ [place] office ok'd / pt was called by AW  . Colonoscopy  05/24/2012    Procedure: COLONOSCOPY;  Surgeon: Rogene Houston, MD;  [facility];  Service: Endoscopy;  Laterality: N/A;  220    LMP 04/09/2011  Visit Diagnosis:  Plantar fasciitis, left  Muscle  tightness  Pes anserinus bursitis of right knee      Subjective Assessment - 12/04/14 1151    Symptoms All heel pain.  Slept OK.  Sore this morning when I got up.  Had some calf soreness yesterday but better today.     Currently in Pain? Yes   Pain Score 6    Pain Location Heel   Pain Orientation Left   Pain Descriptors / Indicators Sore   Aggravating Factors  prolonged walking   Pain Relieving Factors needling                    [facility] Adult PT Treatment/Exercise - 12/04/14 1902    Moist Heat Therapy   Number Minutes Moist Heat 6 Minutes   Moist Heat Location --  calf and heel   Manual Therapy   Manual Therapy Myofascial release;Joint mobilization   Joint Mobilization ankle talocural PA, Inv/EV, talocural AP; calcaneal distraction grade 3   Myofascial Release gastrocnemius,soleus, posterior tibialis          Trigger Point Dry Needling - 12/04/14 1903    Consent Given? Yes   Muscles Treated Lower Body Quadratus plantae  tibialis posterior, medial gastroc   Gastrocnemius Response Palpable increased muscle length   Quadatus Plantae Response Palpable increased muscle length  PT Short Term Goals - 12/04/14 1907    PT SHORT TERM GOAL #1   Title Pt will be independent with initial HEP   Status Achieved   PT SHORT TERM GOAL #2   Title Pt will reduce L plantar fasciitis to 5/10     Status Achieved   PT SHORT TERM GOAL #3   Title Take LEFS next visit and make goal   Status Achieved           PT Long Term Goals - 12/04/14 1907    PT LONG TERM GOAL #1   Title Pt will be able to walk without widened based compensatory gait for pain   Status Achieved   PT LONG TERM GOAL #2   Title "Pain will decrease to less than 3/10 with all functional activities   Time 6   Period Weeks   Status Partially Met   PT LONG TERM GOAL #3   Title improve LEFS score from 2nd visit by at least 25%   Time 6   Period Weeks   Status On-going   PT LONG  TERM GOAL #4   Title Pt will be able to perform single limb stance for 30 seconds on Left /Right LE in order to improve gait   Time 6   Period Weeks   Status On-going   PT LONG TERM GOAL #5   Title "Demonstrate and verbalize techniques to reduce the risk of re-injury including: lifting, posture, body mechanics and improve stance without compensatory movements   Status Achieved               Plan - 12/04/14 1905    Clinical Impression Statement Patient continues to be sore from exacerbation of symptoms since shopping excursion on Saturday.  Decreased tender point number and size from 2 days ago.  Gait still antalgic.     PT Next Visit Plan Reassess pain and response to  dry needling, manual therapy; resume closed chain exercises and dynamic exercise; taping as needed        Problem List Patient Active Problem List   Diagnosis Date Noted  . IBS (irritable bowel syndrome) 04/26/2012  . Abdominal pain, acute, periumbilical 40/68/4033  . Duodenal perforation 08/14/2011  . GERD (gastroesophageal reflux disease) 08/14/2011  . PE (pulmonary embolism) 08/14/2011    Alvera Singh 12/04/2014, 7:09 PM  [facility] [address] [place], [place], [address] Phone: [phone]   Fax:  [phone]     Ruben Im, PT 12/04/2014 7:10 PM Phone: [phone] Fax: [phone]

## 2014-12-09 ENCOUNTER — Ambulatory Visit: Payer: Managed Care, Other (non HMO) | Attending: Orthopaedic Surgery | Admitting: Physical Therapy

## 2014-12-09 DIAGNOSIS — M7051 Other bursitis of knee, right knee: Secondary | ICD-10-CM | POA: Diagnosis not present

## 2014-12-09 DIAGNOSIS — M722 Plantar fascial fibromatosis: Secondary | ICD-10-CM | POA: Insufficient documentation

## 2014-12-09 DIAGNOSIS — G729 Myopathy, unspecified: Secondary | ICD-10-CM | POA: Insufficient documentation

## 2014-12-09 DIAGNOSIS — M6289 Other specified disorders of muscle: Secondary | ICD-10-CM

## 2014-12-09 NOTE — Therapy (Signed)
[place], [place], [address] Phone: [phone]   Fax:  [phone]  Physical Therapy Treatment  Patient Details  Name: Mallory [name] MRN: [id number] Date of Birth: 1955/11/22 Referring Provider:  Aletha Halim., PA-C  Encounter Date: 12/09/2014      PT End of Session - 12/09/14 3546    Visit Number 13   Number of Visits 18   Date for PT Re-Evaluation 12/25/14   PT Start Time 5681   PT Stop Time 1625   PT Time Calculation (min) 40 min   Activity Tolerance Patient tolerated treatment well;Patient limited by pain;Patient limited by fatigue      Past Medical History  Diagnosis Date  . Pulmonary embolism     5 yrs ago (unknown region)  . GERD (gastroesophageal reflux disease)     Past Surgical History  Procedure Laterality Date  . Cesarean section  1991    mc  . Cholecystectomy  15 yrs ago    mc  . Ercp w/ sphicterotomy  08/12/11    Dr Rehman-?microlithiasis, SOD  . Ercp  08/12/2011    Procedure: ENDOSCOPIC RETROGRADE CHOLANGIOPANCREATOGRAPHY (ERCP);  Surgeon: Rogene Houston, MD;  [facility]: [facility];  Service: Endoscopy;  Laterality: N/A;  . Sphincterotomy  08/12/2011    Procedure: SPHINCTEROTOMY;  Surgeon: Rogene Houston, MD;  Location: [facility];  Service: Endoscopy;;  with ballon passage  . Esophagogastroduodenoscopy  11/25/2011    Procedure: ESOPHAGOGASTRODUODENOSCOPY (EGD);  Surgeon: Rogene Houston, MD;  [facility];  Service: Endoscopy;  Laterality: N/A;  830  . Eus  01/19/2012    Procedure: UPPER ENDOSCOPIC ULTRASOUND (EUS) LINEAR;  Surgeon: Milus Banister, MD;  [facility];  Service: Endoscopy;  Laterality: N/A;  pt moved up a hour early by AW , Patty @ [place] office ok'd / pt was called by AW  . Colonoscopy  05/24/2012    Procedure: COLONOSCOPY;  Surgeon: Rogene Houston, MD;  [facility];  Service: Endoscopy;  Laterality: N/A;  220    LMP  04/09/2011  Visit Diagnosis:  Muscle tightness  Plantar fasciitis, left      Subjective Assessment - 12/09/14 1548    Symptoms pain improved.  better dry needle and time off feet.   3/10 am, 2/10 now  "Tingy" feeling   worse with extra time on feet.  still doing her exercises.   Able to rest a little more last 2 days.                    [facility] Adult PT Treatment/Exercise - 12/09/14 1551    Knee/Hip Exercises: Standing   Heel Raises --  bilateral 10 reps 1 very difficult.  added to home exercise.   Forward Step Up --  10 reps 2 sets 6 inches, Lt   Rocker Board --  pro stretch 10 reps min assist for trunk   Manual Therapy   Manual Therapy Myofascial release;Joint mobilization   Joint Mobilization ankle talocural PA, Inv/EV, talocural AP; calcaneal distraction grade 3   Myofascial Release plantar at heel   Ankle Exercises: Aerobic   Stationary Bike 5 minutes   Ankle Exercises: Stretches   Plantar Fascia Stretch 3 reps;30 seconds   Gastroc Stretch 3 reps;30 seconds   Slant Board Stretch --   Ankle Exercises: Standing   Heel Walk (Round Trip) --   Toe Walk (Round Trip) 35 feet, low height noted bilateral  PT Education - 12/09/14 1633    Education provided Yes   Education Details ecentric lowering gastroc, toe walking    Person(s) Educated Patient   Methods Explanation;Demonstration;Verbal cues   Comprehension Verbalized understanding;Returned demonstration          PT Short Term Goals - 12/04/14 1907    PT SHORT TERM GOAL #1   Title Pt will be independent with initial HEP   Status Achieved   PT SHORT TERM GOAL #2   Title Pt will reduce L plantar fasciitis to 5/10     Status Achieved   PT SHORT TERM GOAL #3   Title Take LEFS next visit and make goal   Status Achieved           PT Long Term Goals - 12/09/14 1637    PT LONG TERM GOAL #2   Status On-going   PT LONG TERM GOAL #3   Status Unable to assess   PT LONG TERM GOAL #4    Status Unable to assess               Plan - 12/09/14 1634    Clinical Impression Statement Last session with needleing and mobilization helpful.  able to progress to more closed chain.  Calf weak.   PT Next Visit Plan review gastroc stretches, dry needling as needed. closed chain   Consulted and Agree with Plan of Care Patient        Problem List Patient Active Problem List   Diagnosis Date Noted  . IBS (irritable bowel syndrome) 04/26/2012  . Abdominal pain, acute, periumbilical 84/16/6063  . Duodenal perforation 08/14/2011  . GERD (gastroesophageal reflux disease) 08/14/2011  . PE (pulmonary embolism) 08/14/2011  Melvenia Needles, PTA 12/09/2014 4:44 PM Phone: [phone] Fax: [phone]   [facility] 12/09/2014, 4:44 PM  [facility] [address] [place], [place], [address] Phone: [phone]   Fax:  [phone]

## 2014-12-11 ENCOUNTER — Ambulatory Visit: Payer: Managed Care, Other (non HMO) | Admitting: Physical Therapy

## 2014-12-11 DIAGNOSIS — M722 Plantar fascial fibromatosis: Secondary | ICD-10-CM | POA: Diagnosis not present

## 2014-12-11 DIAGNOSIS — M6289 Other specified disorders of muscle: Secondary | ICD-10-CM

## 2014-12-11 NOTE — Therapy (Signed)
[place], [place], [address] Phone: [phone]   Fax:  [phone]  Physical Therapy Treatment  Patient Details  Name: Mallory [name] MRN: [id number] Date of Birth: 1956-07-20 Referring Provider:  Aletha Halim., PA-C  Encounter Date: 12/11/2014      PT End of Session - 12/11/14 1721    Visit Number 14   Number of Visits 18   Date for PT Re-Evaluation 12/25/14   PT Start Time 1625   PT Stop Time 1735   PT Time Calculation (min) 70 min   Activity Tolerance Patient tolerated treatment well      Past Medical History  Diagnosis Date  . Pulmonary embolism     5 yrs ago (unknown region)  . GERD (gastroesophageal reflux disease)     Past Surgical History  Procedure Laterality Date  . Cesarean section  1991    mc  . Cholecystectomy  15 yrs ago    mc  . Ercp w/ sphicterotomy  08/12/11    Dr Rehman-?microlithiasis, SOD  . Ercp  08/12/2011    Procedure: ENDOSCOPIC RETROGRADE CHOLANGIOPANCREATOGRAPHY (ERCP);  Surgeon: Rogene Houston, MD;  [facility]: [facility];  Service: Endoscopy;  Laterality: N/A;  . Sphincterotomy  08/12/2011    Procedure: SPHINCTEROTOMY;  Surgeon: Rogene Houston, MD;  [facility]: [facility];  Service: Endoscopy;;  with ballon passage  . Esophagogastroduodenoscopy  11/25/2011    Procedure: ESOPHAGOGASTRODUODENOSCOPY (EGD);  Surgeon: Rogene Houston, MD;  [facility];  Service: Endoscopy;  Laterality: N/A;  830  . Eus  01/19/2012    Procedure: UPPER ENDOSCOPIC ULTRASOUND (EUS) LINEAR;  Surgeon: Milus Banister, MD;  [facility];  Service: Endoscopy;  Laterality: N/A;  pt moved up a hour early by AW , Patty @ [place] office ok'd / pt was called by AW  . Colonoscopy  05/24/2012    Procedure: COLONOSCOPY;  Surgeon: Rogene Houston, MD;  [facility];  Service: Endoscopy;  Laterality: N/A;  220    LMP 04/09/2011  Visit Diagnosis:  Muscle tightness  Plantar  fasciitis, left      Subjective Assessment - 12/11/14 1715    Symptoms Patient states she is doing better.  2-3/10 heel pain.     Currently in Pain? Yes   Pain Score 3    Pain Location Heel   Pain Orientation Left   Aggravating Factors  first thing in the AM; prolonged walking   Pain Relieving Factors needling                    [facility] Adult PT Treatment/Exercise - 12/11/14 1717    Knee/Hip Exercises: Stretches   Gastroc Stretch 3 reps;30 seconds  Prostretch   Knee/Hip Exercises: Standing   Heel Raises 10 reps  eccentric lowering   Other Standing Knee Exercises Tai Chi tibial nerve glide: bend knee to heel tap 10x   Knee/Hip Exercises: Seated   Other Seated Knee Exercises towel scrunches 3 min   Other Seated Knee Exercises long sitting tibial nerve glide 10x   Knee/Hip Exercises: Supine   Other Supine Knee Exercises ankle inv and eversion 15x red band   Other Supine Knee Exercises tibial nerve floss 10x   Moist Heat Therapy   Number Minutes Moist Heat 10 Minutes   Moist Heat Location --  calf and plantar fascia   Manual Therapy   Manual Therapy Joint mobilization;Myofascial release;Other (comment)   Joint Mobilization ankle talocural PA,  Inv/EV, talocural AP; calcaneal distraction grade 3   Myofascial Release plantar at heel   Other Manual Therapy fascia taping with strapping tape 3 strips          Trigger Point Dry Needling - 12/11/14 1720    Consent Given? Yes   Muscles Treated Lower Body Quadratus plantae  left tibialis posterior   Quadatus Plantae Response Palpable increased muscle length      Left LE.          PT Short Term Goals - 12/11/14 1736    PT SHORT TERM GOAL #1   Title Pt will be independent with initial HEP   Status Achieved   PT SHORT TERM GOAL #2   Title Pt will reduce L plantar fasciitis to 5/10     Status Achieved   PT SHORT TERM GOAL #3   Title Take LEFS next visit and make goal   Status Achieved           PT  Long Term Goals - 12/11/14 1736    PT LONG TERM GOAL #1   Title Pt will be able to walk without widened based compensatory gait for pain   Status Achieved   PT LONG TERM GOAL #2   Title "Pain will decrease to less than 3/10 with all functional activities   Time 6   Period Weeks   Status On-going   PT LONG TERM GOAL #3   Title improve LEFS score from 2nd visit by at least 25%   Time 6   Period Weeks   Status On-going   PT LONG TERM GOAL #4   Title Pt will be able to perform single limb stance for 30 seconds on Left /Right LE in order to improve gait   Time 6   Period Weeks   Status On-going   PT LONG TERM GOAL #5   Title "Demonstrate and verbalize techniques to reduce the risk of re-injury including: lifting, posture, body mechanics and improve stance without compensatory movements   Status Achieved               Plan - 12/11/14 1721    Clinical Impression Statement Patient reports decreased pain this week.  Added tibial nerve glides in standing, long sitting and supine with quite a bit of pull produced. Continues to have decreased gastroc muscle length and weakness.  Therapist closely monitoring pain throughout treatment session.     PT Next Visit Plan Assess response to needling and taping; continue LE stretching and strengthening.  Should meet remaining goals in 2 weeks.          Problem List Patient Active Problem List   Diagnosis Date Noted  . IBS (irritable bowel syndrome) 04/26/2012  . Abdominal pain, acute, periumbilical 61/22/4497  . Duodenal perforation 08/14/2011  . GERD (gastroesophageal reflux disease) 08/14/2011  . PE (pulmonary embolism) 08/14/2011    Alvera Singh 12/11/2014, 5:38 PM  [facility] [address] [place], [place], [address] Phone: [phone]   Fax:  [phone]  Ruben Im, PT 12/11/2014 5:39 PM Phone: [phone] Fax: [phone]

## 2014-12-17 ENCOUNTER — Ambulatory Visit: Payer: Managed Care, Other (non HMO) | Admitting: Physical Therapy

## 2014-12-17 DIAGNOSIS — M722 Plantar fascial fibromatosis: Secondary | ICD-10-CM

## 2014-12-17 DIAGNOSIS — M6289 Other specified disorders of muscle: Secondary | ICD-10-CM

## 2014-12-17 NOTE — Therapy (Signed)
[place], [place], [address] Phone: [phone]   Fax:  [phone]  Physical Therapy Treatment  Patient Details  Name: Mallory [name] MRN: [id number] Date of Birth: 1956/02/20 Referring Provider:  Aletha Halim., PA-C  Encounter Date: 12/17/2014      PT End of Session - 12/17/14 7048    Visit Number 15   Number of Visits 18   Date for PT Re-Evaluation 12/25/14   PT Start Time 1500   PT Stop Time 1546   PT Time Calculation (min) 46 min   Activity Tolerance Patient tolerated treatment well      Past Medical History  Diagnosis Date  . Pulmonary embolism     5 yrs ago (unknown region)  . GERD (gastroesophageal reflux disease)     Past Surgical History  Procedure Laterality Date  . Cesarean section  1991    mc  . Cholecystectomy  15 yrs ago    [facility]  . Ercp w/ sphicterotomy  08/12/11    Dr Rehman-?microlithiasis, SOD  . Ercp  08/12/2011    Procedure: ENDOSCOPIC RETROGRADE CHOLANGIOPANCREATOGRAPHY (ERCP);  Surgeon: Rogene Houston, MD;  [facility]: [facility];  Service: Endoscopy;  Laterality: N/A;  . Sphincterotomy  08/12/2011    Procedure: SPHINCTEROTOMY;  Surgeon: Rogene Houston, MD;  Location: [facility];  Service: Endoscopy;;  with ballon passage  . Esophagogastroduodenoscopy  11/25/2011    Procedure: ESOPHAGOGASTRODUODENOSCOPY (EGD);  Surgeon: Rogene Houston, MD;  [facility];  Service: Endoscopy;  Laterality: N/A;  830  . Eus  01/19/2012    Procedure: UPPER ENDOSCOPIC ULTRASOUND (EUS) LINEAR;  Surgeon: Milus Banister, MD;  [facility];  Service: Endoscopy;  Laterality: N/A;  pt moved up a hour early by AW , Patty @ [place] office ok'd / pt was called by AW  . Colonoscopy  05/24/2012    Procedure: COLONOSCOPY;  Surgeon: Rogene Houston, MD;  [facility];  Service: Endoscopy;  Laterality: N/A;  220    LMP 04/09/2011  Visit Diagnosis:  Muscle tightness  Plantar  fasciitis, left      Subjective Assessment - 12/17/14 1504    Symptoms no pain now, 2/10 pain first thing in am..  Has been doing her exercises   Legs ach at night with restless leg syndrome,     Currently in Pain? No/denies   Aggravating Factors  shopping.   If she can rest frequently she is OK.   Pain Relieving Factors stretches, nerve glides   Effect of Pain on Daily Activities limits shopping   Multiple Pain Sites No                    [facility] Adult PT Treatment/Exercise - 12/17/14 1514    Knee/Hip Exercises: Stretches   Gastroc Stretch 3 reps;30 seconds   Knee/Hip Exercises: Standing   Heel Raises --  10 reps eccentric lowering.   SLS with Vectors --  Hip hinge, single leg, 5 reps each, minimal assist hip posit   Other Standing Knee Exercises Tai Chi Nerve glides 10 reps, cues   Knee/Hip Exercises: Seated   Other Seated Knee Exercises Tibial nerve floss  10 reps, long sitting  Long sitting, cues   Manual Therapy   Joint Mobilization calcaneal   Ankle Exercises: Standing   Heel Raises --  10 reps, both on step eccentric lowering, less pain reported   Toe Walk (Round Trip) --  heel to toe  forward.   Stepping over cones lateral   Ankle Exercises: Supine   T-Band IV/EV 15 reps, red band   Other Supine Ankle Exercises Tibial nerve floss 10 reps,    Ankle Exercises: Seated   Towel Crunch --  3 minutes, small crampy sensation                  PT Short Term Goals - 12/11/14 1736    PT SHORT TERM GOAL #1   Title Pt will be independent with initial HEP   Status Achieved   PT SHORT TERM GOAL #2   Title Pt will reduce L plantar fasciitis to 5/10     Status Achieved   PT SHORT TERM GOAL #3   Title Take LEFS next visit and make goal   Status Achieved           PT Long Term Goals - 12/17/14 1558    PT LONG TERM GOAL #2   Title "Pain will decrease to less than 3/10 with all functional activities   Time 6   Period Weeks   Status On-going   PT  LONG TERM GOAL #3   Title improve LEFS score from 2nd visit by at least 25%   Time 6   Period Weeks   Status Unable to assess   PT LONG TERM GOAL #4   Title Pt will be able to perform single limb stance for 30 seconds on Left /Right LE in order to improve gait   Baseline 7 best todat   Time 6   Period Weeks   Status On-going               Plan - 12/17/14 1550    Clinical Impression Statement decreased pain this week.  No modalities needed this session  Walking feels almost normal post exercise.  Neural glides helpful.  Taping helped get her through a birthday party then she took it off.  Needling always helps.   PT Next Visit Plan ; continue LE stretching and strengthening.     Consulted and Agree with Plan of Care Patient        Problem List Patient Active Problem List   Diagnosis Date Noted  . IBS (irritable bowel syndrome) 04/26/2012  . Abdominal pain, acute, periumbilical 09/38/1829  . Duodenal perforation 08/14/2011  . GERD (gastroesophageal reflux disease) 08/14/2011  . PE (pulmonary embolism) 08/14/2011   Melvenia Needles, PTA 12/17/2014 4:01 PM Phone: [phone] Fax: [phone]  [facility] 12/17/2014, 4:01 PM  [facility] [address] [place], [place], [address] Phone: [phone]   Fax:  [phone]

## 2014-12-18 ENCOUNTER — Encounter: Payer: Managed Care, Other (non HMO) | Admitting: Physical Therapy

## 2014-12-22 ENCOUNTER — Ambulatory Visit: Payer: Managed Care, Other (non HMO) | Admitting: Physical Therapy

## 2014-12-22 DIAGNOSIS — M6289 Other specified disorders of muscle: Secondary | ICD-10-CM

## 2014-12-22 DIAGNOSIS — M722 Plantar fascial fibromatosis: Secondary | ICD-10-CM | POA: Diagnosis not present

## 2014-12-22 NOTE — Therapy (Signed)
[place], [place], [address] Phone: [phone]   Fax:  [phone]  Physical Therapy Treatment  Patient Details  Name: Mallory [name] MRN: [id number] Date of Birth: 03/16/1956 Referring Provider:  Aletha Halim., PA-C  Encounter Date: 12/22/2014      PT End of Session - 12/22/14 1741    Visit Number 16   Number of Visits 18   Date for PT Re-Evaluation 12/25/14   PT Start Time 1630   PT Stop Time 7494   PT Time Calculation (min) 47 min   Activity Tolerance Patient tolerated treatment well      Past Medical History  Diagnosis Date  . Pulmonary embolism     5 yrs ago (unknown region)  . GERD (gastroesophageal reflux disease)     Past Surgical History  Procedure Laterality Date  . Cesarean section  1991    mc  . Cholecystectomy  15 yrs ago    mc  . Ercp w/ sphicterotomy  08/12/11    Dr Rehman-?microlithiasis, SOD  . Ercp  08/12/2011    Procedure: ENDOSCOPIC RETROGRADE CHOLANGIOPANCREATOGRAPHY (ERCP);  Surgeon: Rogene Houston, MD;  [facility]: [facility];  Service: Endoscopy;  Laterality: N/A;  . Sphincterotomy  08/12/2011    Procedure: SPHINCTEROTOMY;  Surgeon: Rogene Houston, MD;  Location: [facility];  Service: Endoscopy;;  with ballon passage  . Esophagogastroduodenoscopy  11/25/2011    Procedure: ESOPHAGOGASTRODUODENOSCOPY (EGD);  Surgeon: Rogene Houston, MD;  [facility];  Service: Endoscopy;  Laterality: N/A;  830  . Eus  01/19/2012    Procedure: UPPER ENDOSCOPIC ULTRASOUND (EUS) LINEAR;  Surgeon: Milus Banister, MD;  [facility];  Service: Endoscopy;  Laterality: N/A;  pt moved up a hour early by AW , Patty @ [place] office ok'd / pt was called by AW  . Colonoscopy  05/24/2012    Procedure: COLONOSCOPY;  Surgeon: Rogene Houston, MD;  [facility];  Service: Endoscopy;  Laterality: N/A;  220    There were no vitals filed for this visit.  Visit Diagnosis:   Muscle tightness      Subjective Assessment - 12/22/14 1630    Symptoms 3/10  tingles  intermittantly,  wentshopping 1.5 hours off and on 8/10.   Did not not ice, eased with proped feet up in recliner.   Yesterday 8/10 hurt all day.   Iced yesterday, got off it.  Today the pain is not that bad.   Pain Score 3    Pain Location Heel   Pain Orientation Left   Pain Descriptors / Indicators Tingling   Pain Type Chronic pain   Aggravating Factors  shopping,    Pain Relieving Factors Nothing really helped.   Effect of Pain on Daily Activities limits shopping, activities on weekends ,  She is not on it at work.   Multiple Pain Sites No                       [facility] Adult PT Treatment/Exercise - 12/22/14 1644    Knee/Hip Exercises: Stretches   Gastroc Stretch 3 reps;30 seconds   Knee/Hip Exercises: Supine   Other Supine Knee Exercises Ankle IV with SLR  10 reps   Other Supine Knee Exercises Tibial nerve floss, Kenn straight   Manual Therapy   Manual Therapy Myofascial release   Joint Mobilization --  Fibula, ankle   Myofascial Release plantar,   Other Manual Therapy passive  PF 54 degrees active improved to 62 degrees LT, DF 10 degrees LT   Ankle Exercises: Seated   Other Seated Ankle Exercises Heel ER 10 reps.   Ankle Exercises: Standing   SLS 7 seconds, each several tries   Rocker Board 1 minute  soft fitter   Heel Raises 10 reps  eccentric lowering   Tai Chi 10 reps with floss   Ankle Exercises: Supine   T-Band EV/ IV manual resistance.                  PT Short Term Goals - 12/11/14 1736    PT SHORT TERM GOAL #1   Title Pt will be independent with initial HEP   Status Achieved   PT SHORT TERM GOAL #2   Title Pt will reduce L plantar fasciitis to 5/10     Status Achieved   PT SHORT TERM GOAL #3   Title Take LEFS next visit and make goal   Status Achieved           PT Long Term Goals - 12/22/14 1745    PT LONG TERM GOAL #2   Baseline 8/10 -  shopping   Status On-going   PT LONG TERM GOAL #3   Title improve LEFS score from 2nd visit by at least 25%   Status Unable to assess   PT LONG TERM GOAL #4   Baseline 7 seconds   Time 6   Period Weeks   Status On-going               Plan - 12/22/14 1743    Clinical Impression Statement Pain flare with shopping lasting 2 days.  Not sure why she did not use Ice Sat. after shopping. today pain better 3/10.  Marland Kitchen    PT Next Visit Plan MD note, continue stretches   Consulted and Agree with Plan of Care Patient        Problem List Patient Active Problem List   Diagnosis Date Noted  . IBS (irritable bowel syndrome) 04/26/2012  . Abdominal pain, acute, periumbilical 84/78/4128  . Duodenal perforation 08/14/2011  . GERD (gastroesophageal reflux disease) 08/14/2011  . PE (pulmonary embolism) 08/14/2011  Melvenia Needles, PTA 12/22/2014 5:46 PM Phone: [phone] Fax: [phone]   [facility] 12/22/2014, 5:46 PM  [facility] [address] [place], [place], [address] Phone: [phone]   Fax:  [phone]

## 2014-12-23 ENCOUNTER — Ambulatory Visit (INDEPENDENT_AMBULATORY_CARE_PROVIDER_SITE_OTHER): Payer: Managed Care, Other (non HMO) | Admitting: Internal Medicine

## 2014-12-23 ENCOUNTER — Encounter: Payer: Self-pay | Admitting: Internal Medicine

## 2014-12-23 VITALS — BP 110/78 | HR 54 | Temp 97.8°F | Wt 182.0 lb

## 2014-12-23 DIAGNOSIS — K219 Gastro-esophageal reflux disease without esophagitis: Secondary | ICD-10-CM

## 2014-12-23 DIAGNOSIS — Z23 Encounter for immunization: Secondary | ICD-10-CM

## 2014-12-23 DIAGNOSIS — Z0000 Encounter for general adult medical examination without abnormal findings: Secondary | ICD-10-CM

## 2014-12-23 NOTE — Assessment & Plan Note (Signed)
Not well controlled Continue Prilosec and Levsin for now Will refer to [facility] for further evaluation of your stomach issues

## 2014-12-23 NOTE — Patient Instructions (Signed)

## 2014-12-23 NOTE — Progress Notes (Signed)
HPI  Pt presents to the clinic today to establish care and for management of the conditions listed below. She has not had a PCP in many years. She would like to get her physical exam today if she could.  Flu: 07/2014  Tetanus: unsure the last one Mammogram: 07/2014- [facility] Pap Smear: 07/2014- hx of abnormal pap smear Colon Screening: 2013 Vision Screening: has appt scheduled 02/2015 Dentist: as needed  History of PE: She was on coumadin x 1 year. Has not had any complications since that time.  GERD: She is taking Protonix twice daily. She reports she is having breakthrough symptoms on the Protonix. Certain foods make it worse, such as spicy and greasy foods. She will occasionally take Levsin prn for cramping. She is being followed by Dr. Laural Golden in [place]. She reports he is refilling her medications but not requesting that she follow up. She would like referral to another GI, preferably Dr. Ardis Hughs with [facility].   Past Medical History  Diagnosis Date  . Pulmonary embolism     5 yrs ago (unknown region)  . GERD (gastroesophageal reflux disease)     Current Outpatient Prescriptions  Medication Sig Dispense Refill  . hyoscyamine (LEVSIN SL) 0.125 MG SL tablet TAKE ONE TABLET DAILY AS NEEDED FOR CRAMPING. 90 tablet 3  . ibuprofen (ADVIL,MOTRIN) 200 MG tablet Take 200-400 mg by mouth daily as needed for pain. Pain    . pantoprazole (PROTONIX) 20 MG tablet Take 1 tablet (20 mg total) by mouth 2 (two) times daily. 60 tablet 3  . Phenyleph-Diphenhyd-GG-APAP (MUCINEX SINUS-MAX DAY/NIGHT PO) Take 1 tablet by mouth once as needed (FOR RELIEF OF SYMPTOMS).     No current facility-administered medications for this visit.    Allergies  Allergen Reactions  . Promethazine Hcl Hives  . Hydromorphone Itching  . Nitrofurantoin Monohyd Macro Itching    Family History  Problem Relation Age of Onset  . Diabetes Mother   . Heart failure Father   . Diabetes Brother   . Anesthesia problems  Neg Hx   . Hypotension Neg Hx   . Malignant hyperthermia Neg Hx   . Pseudochol deficiency Neg Hx     History   Social History  . Marital Status: Single    Spouse Name: N/A  . Number of Children: 2  . Years of Education: N/A   Occupational History  . [employer]    Social History Main Topics  . Smoking status: Never Smoker   . Smokeless tobacco: Not on file  . Alcohol Use: No  . Drug Use: No  . Sexual Activity: Yes    Birth Control/ Protection: Post-menopausal   Other Topics Concern  . Not on file   Social History Narrative   Lives w/ 29 yr old daughter.  2 daughters          ROS:  Constitutional: Denies fever, malaise, fatigue, headache or abrupt weight changes.  HEENT: Denies eye pain, eye redness, ear pain, ringing in the ears, wax buildup, runny nose, nasal congestion, bloody nose, or sore throat. Respiratory: Denies difficulty breathing, shortness of breath, cough or sputum production.   Cardiovascular: Denies chest pain, chest tightness, palpitations or swelling in the hands or feet.  Gastrointestinal: Pt reports reflux and abdominal cramping. Denies bloating, constipation, diarrhea or blood in the stool.  GU: Pt reports stress incontinence. Denies frequency, urgency, pain with urination, blood in urine, odor or discharge. Musculoskeletal: Denies decrease in range of motion, difficulty with gait, muscle pain or joint  pain and swelling.  Skin: Denies redness, rashes, lesions or ulcercations.  Neurological: Denies dizziness, difficulty with memory, difficulty with speech or problems with balance and coordination.  Psych: She denies anxiety, depression, SI/HI.  No other specific complaints in a complete review of systems (except as listed in HPI above).  PE:  BP 110/78 mmHg  Pulse 54  Temp(Src) 97.8 F (36.6 C) (Oral)  Wt 182 lb (82.555 kg)  SpO2 97%  LMP 04/09/2011   Wt Readings from Last 3 Encounters:  12/23/14 182 lb (82.555 kg)  11/21/13 182 lb 3.2 oz  (82.645 kg)  06/20/13 181 lb 6.4 oz (82.283 kg)    General: Appears her stated age, obese in NAD. HEENT: Head: normal shape and size; Eyes: sclera white, no icterus, conjunctiva pink, PERRLA and EOMs intact; Ears: Tm's gray and intact, normal light reflex; Throat/Mouth: Teeth present, mucosa pink and moist, no lesions or ulcerations noted.  Neck:  Neck supple, trachea midline. No masses, lumps or thyromegaly present.  Cardiovascular: Normal rate and rhythm. S1,S2 noted.  No murmur, rubs or gallops noted. No JVD or BLE edema. No carotid bruits noted. Pulmonary/Chest: Normal effort and positive vesicular breath sounds. No respiratory distress. No wheezes, rales or ronchi noted.  Abdomen: Soft and nontender. Normal bowel sounds, no bruits noted. No distention or masses noted. Liver, spleen and kidneys non palpable. Musculoskeletal: Normal range of motion. Strength 5/5 BUE/BLE. No signs of joint swelling. No difficulty with gait.  Neurological: Alert and oriented. Cranial nerves II-XII grossly intact. Coordination normal.  Psychiatric: Mood and affect normal. Behavior is normal. Judgment and thought content normal.    BMET    Component Value Date/Time   NA 142 12/12/2013 2101   K 4.1 12/12/2013 2101   CL 103 12/12/2013 2101   CO2 29 12/12/2013 2101   GLUCOSE 110* 12/12/2013 2101   BUN 9 12/12/2013 2101   CREATININE 0.80 12/12/2013 2101   CREATININE 0.85 11/03/2011 1205   CALCIUM 9.4 12/12/2013 2101   GFRNONAA 81* 12/12/2013 2101   GFRAA >90 12/12/2013 2101    Lipid Panel  No results found for: CHOL, TRIG, HDL, CHOLHDL, VLDL, LDLCALC  CBC    Component Value Date/Time   WBC 8.2 12/12/2013 2101   RBC 5.60* 12/12/2013 2101   HGB 15.9* 12/12/2013 2101   HCT 45.9 12/12/2013 2101   PLT 221 12/12/2013 2101   MCV 82.0 12/12/2013 2101   MCH 28.4 12/12/2013 2101   MCHC 34.6 12/12/2013 2101   RDW 13.9 12/12/2013 2101   LYMPHSABS 2.6 12/12/2013 2101   MONOABS 0.8 12/12/2013 2101    EOSABS 0.2 12/12/2013 2101   BASOSABS 0.0 12/12/2013 2101    Hgb A1C No results found for: HGBA1C   Assessment and Plan:  Preventative Health Maintenance:  Tetanus UTD Encouraged her to visit a dentist on at least a yearly basis Encouraged her to work on diet and exercise Will check CBC, CMET, Lipid and A1C today  RTC in 1 year or sooner if needed

## 2014-12-23 NOTE — Progress Notes (Signed)
Pre visit review using our clinic review tool, if applicable. No additional management support is needed unless otherwise documented below in the visit note. 

## 2014-12-24 LAB — HEMOGLOBIN A1C: Hgb A1c MFr Bld: 6.3 % (ref 4.6–6.5)

## 2014-12-24 LAB — COMPREHENSIVE METABOLIC PANEL
ALK PHOS: 88 U/L (ref 39–117)
ALT: 25 U/L (ref 0–35)
AST: 21 U/L (ref 0–37)
Albumin: 4.2 g/dL (ref 3.5–5.2)
BILIRUBIN TOTAL: 0.4 mg/dL (ref 0.2–1.2)
BUN: 14 mg/dL (ref 6–23)
CO2: 29 meq/L (ref 19–32)
Calcium: 9.6 mg/dL (ref 8.4–10.5)
Chloride: 106 mEq/L (ref 96–112)
Creatinine, Ser: 0.88 mg/dL (ref 0.40–1.20)
GFR: 70.53 mL/min (ref >60.00)
GLUCOSE: 111 mg/dL — AB (ref 70–99)
POTASSIUM: 4.1 meq/L (ref 3.5–5.1)
SODIUM: 142 meq/L (ref 135–145)
Total Protein: 6.9 g/dL (ref 6.0–8.3)

## 2014-12-24 LAB — CBC
HEMATOCRIT: 45 % (ref 36.0–46.0)
HEMOGLOBIN: 15.5 g/dL — AB (ref 12.0–15.0)
MCHC: 34.5 g/dL (ref 30.0–36.0)
MCV: 82.2 fl (ref 78.0–100.0)
Platelets: 258 10*3/uL (ref 150.0–400.0)
RBC: 5.48 Mil/uL — ABNORMAL HIGH (ref 3.87–5.11)
RDW: 14 % (ref 11.5–15.5)
WBC: 7.4 10*3/uL (ref 4.0–10.5)

## 2014-12-24 LAB — LIPID PANEL
CHOLESTEROL: 227 mg/dL — AB (ref 0–200)
HDL: 45.9 mg/dL (ref >39.00)
LDL Cholesterol: 144 mg/dL — ABNORMAL HIGH (ref 0–99)
NONHDL: 181.1
Total CHOL/HDL Ratio: 5
Triglycerides: 186 mg/dL — ABNORMAL HIGH (ref 0.0–149.0)
VLDL: 37.2 mg/dL (ref 0.0–40.0)

## 2014-12-24 LAB — VITAMIN D 25 HYDROXY (VIT D DEFICIENCY, FRACTURES): VITD: 21.29 ng/mL — AB (ref 30.00–100.00)

## 2014-12-25 ENCOUNTER — Ambulatory Visit: Payer: Managed Care, Other (non HMO) | Admitting: Physical Therapy

## 2014-12-25 ENCOUNTER — Encounter: Payer: Self-pay | Admitting: Internal Medicine

## 2014-12-25 DIAGNOSIS — M722 Plantar fascial fibromatosis: Secondary | ICD-10-CM | POA: Diagnosis not present

## 2014-12-25 DIAGNOSIS — M6289 Other specified disorders of muscle: Secondary | ICD-10-CM

## 2014-12-25 NOTE — Therapy (Signed)
[place], [place], [address] Phone: [phone]   Fax:  [phone]  Physical Therapy Treatment  Patient Details  Name: Mallory [name] MRN: [id number] Date of Birth: 02-Jun-1956 Referring Provider:  Aletha Halim., PA-C  Encounter Date: 12/25/2014      PT End of Session - 12/25/14 0762    Visit Number 17   Number of Visits 18   Date for PT Re-Evaluation 12/25/14   PT Start Time 33   PT Stop Time 2633   PT Time Calculation (min) 45 min   Activity Tolerance Patient tolerated treatment well      Past Medical History  Diagnosis Date  . Pulmonary embolism     5 yrs ago (unknown region)  . GERD (gastroesophageal reflux disease)     Past Surgical History  Procedure Laterality Date  . Cesarean section  1991    [facility]  . Cholecystectomy  15 yrs ago    [facility]  . Ercp w/ sphicterotomy  08/12/11    Dr Rehman-?microlithiasis, SOD  . Ercp  08/12/2011    Procedure: ENDOSCOPIC RETROGRADE CHOLANGIOPANCREATOGRAPHY (ERCP);  Surgeon: Rogene Houston, MD;  Location: [facility];  Service: Endoscopy;  Laterality: N/A;  . Sphincterotomy  08/12/2011    Procedure: SPHINCTEROTOMY;  Surgeon: Rogene Houston, MD;  Location: [facility];  Service: Endoscopy;;  with ballon passage  . Esophagogastroduodenoscopy  11/25/2011    Procedure: ESOPHAGOGASTRODUODENOSCOPY (EGD);  Surgeon: Rogene Houston, MD;  [facility];  Service: Endoscopy;  Laterality: N/A;  830  . Eus  01/19/2012    Procedure: UPPER ENDOSCOPIC ULTRASOUND (EUS) LINEAR;  Surgeon: Milus Banister, MD;  Location: [facility];  Service: Endoscopy;  Laterality: N/A;  pt moved up a hour early by AW , Patty @ [employer] office ok'd / pt was called by AW  . Colonoscopy  05/24/2012    Procedure: COLONOSCOPY;  Surgeon: Rogene Houston, MD;  [facility]: [facility];  Service: Endoscopy;  Laterality: N/A;  220    There were no vitals filed for this visit.  Visit Diagnosis:   Muscle tightness      Subjective Assessment - 12/25/14 1626    Symptoms No knee pain for a couple weeks.  Foot 2-3/10 pain  when she is off it at work.  When shopping pain increases to 8/10.   Currently in Pain? Yes   Pain Score 3    Pain Location Heel   Pain Orientation Left   Pain Descriptors / Indicators --  Little pains  now and when flaredaches   Pain Type Chronic pain   Pain Onset More than a month ago   Pain Frequency Intermittent   Aggravating Factors  increased time on feet.   Pain Relieving Factors Dry needle, ice, time off feet.   Multiple Pain Sites No                       [facility] Adult PT Treatment/Exercise - 12/25/14 1645    Knee/Hip Exercises: Seated   Other Seated Knee Exercises MMT Rt knee  5/5 quads and Hamstrings.   Manual Therapy   Manual Therapy Joint mobilization;Myofascial release   Myofascial Release plantar, calcaneous, distraction  EV Painful 1 rep   Ankle Exercises: Stretches   Other Stretch DF10 degrees, PF 50 degrees    Ankle Exercises: Aerobic   Stationary Bike Nustep 6 minutes.   Ankle Exercises: Standing   SLS --  Rt 7  seconds, Lt 11 seconds.   Heel Raises --  20 reps , moderate use of hands.                  PT Short Term Goals - 12/25/14 1744    PT SHORT TERM GOAL #1   Title Pt will be independent with initial HEP   Time 2   Period Weeks   Status Achieved   PT SHORT TERM GOAL #2   Title Pt will reduce L plantar fasciitis to 5/10     Time 2   Period Weeks   Status Achieved   PT SHORT TERM GOAL #3   Title Take LEFS next visit and make goal   Time 2   Period Weeks   Status Achieved           PT Long Term Goals - 12/25/14 1744    PT LONG TERM GOAL #1   Title Pt will be able to walk without widened based compensatory gait for pain   Time 6   Period Weeks   Status Achieved   PT LONG TERM GOAL #2   Title "Pain will decrease to less than 3/10 with all functional activities   Baseline 8/10 shopping,  3/10 weekday   Time 6   Period Weeks   PT LONG TERM GOAL #3   Title improve LEFS score from 2nd visit by at least 25%   Baseline 65/80   Time 6   Period Weeks               Plan - 12/25/14 1736    Clinical Impression Statement Patient has had 17 treatments of PT,  she has had 5 dry needle treatments, stretching, strengthening and other.  Knee Rt 5/5 quads, hamstrings, strength Lt foot 4/5 gastroc, 4+/5 DF, IV/EV test 4=?5   PT Next Visit Plan See what MD says.  She will schedule with Marzetta Board if she needs more PT.        Problem List Patient Active Problem List   Diagnosis Date Noted  . GERD (gastroesophageal reflux disease) 08/14/2011  . PE (pulmonary embolism) 08/14/2011   Melvenia Needles, PTA 12/25/2014 5:47 PM Phone: [phone] Fax: [phone]  [facility] 12/25/2014, 5:47 PM  [place] [facility] [address] [place], [place], [address] Phone: [phone]   Fax:  [phone]

## 2014-12-25 NOTE — Patient Instructions (Signed)
Home exercise program reviewed and final home exercise included, consistent daily stretches of fascia, hamstrings, calf and strengthening.  Self care use of ice, orthotics.  Do not walk barefoot.  Community exercise suggestions, : Pool.

## 2014-12-26 MED ORDER — SIMVASTATIN 10 MG PO TABS: 10.0000 mg | ORAL_TABLET | Freq: Every day | ORAL | Status: DC

## 2014-12-26 NOTE — Addendum Note (Signed)
Addended by: Lurlean Nanny on: 12/26/2014 11:06 AM   Modules accepted: Orders

## 2015-01-06 ENCOUNTER — Encounter: Payer: Self-pay | Admitting: Gastroenterology

## 2015-02-27 ENCOUNTER — Ambulatory Visit (INDEPENDENT_AMBULATORY_CARE_PROVIDER_SITE_OTHER): Payer: Managed Care, Other (non HMO) | Admitting: Gastroenterology

## 2015-02-27 ENCOUNTER — Encounter: Payer: Self-pay | Admitting: Gastroenterology

## 2015-02-27 VITALS — BP 112/70 | HR 76 | Ht 64.0 in | Wt 176.2 lb

## 2015-02-27 DIAGNOSIS — K219 Gastro-esophageal reflux disease without esophagitis: Secondary | ICD-10-CM

## 2015-02-27 DIAGNOSIS — K589 Irritable bowel syndrome without diarrhea: Secondary | ICD-10-CM | POA: Diagnosis not present

## 2015-02-27 MED ORDER — HYOSCYAMINE SULFATE 0.125 MG SL SUBL: 0.1250 mg | SUBLINGUAL_TABLET | SUBLINGUAL | Status: DC | PRN

## 2015-02-27 NOTE — Progress Notes (Signed)
HPI: This is a  very pleasant 59 year old woman    who was referred to me by Jearld Fenton, NP  to evaluate  GERD and abdominal pains .    Chief complaint is still having abdominal pains  periumb pains periodically, will occur intermittently, often in AM, can be late at night. Can last 2 minutes.  Takes a sublingual antispasm med and the pain goes away.  Eating Chinese will bring the pains on fairly reliably.  She has also has 'acid reflux bad.'  Takes protonix 40mg  once daily.  She takes this in the morning before breakfast.  Overall she has intentionally been losing weight, 6-7 pounds.    Gallbladder removed remotely 15 years; for abdominal pains.    She has had extensive testing. I reviewed all of the reports and images below  08/2011 ERCP with Dr. Laural Golden, for right upper quadrant pain, elevated liver tests. ERCP was normal. He performed biliary sphincterotomy for possible center of OD dysfunction.  Complicated by post procedure pains, event. 11/2011 EGD with Dr. Laural Golden for epigastric pain, right upper quadrant pain. Some small polyps were removed from the stomach that were hyperplastic on pathology. The examination was otherwise normal 01/2012 endoscopic ultrasound, Dr. Ardis Hughs, referred by Dr. Jearld Adjutant for evaluation of persistent right upper quadrant pain. The examination was normal. 08/2012 Colonoscopy Dr. Laural Golden, for screening, persistent abdominal pain, essentially normal. 2015 CT scan abdomen for abdominal pain was essentially normal  Labs 12/2014 cbc, cmet normal LFTs 2012-2016 persistently normal except mild elevated in single trnasaminase        Review of systems: Pertinent positive and negative review of systems were noted in the above HPI section. Complete review of systems was performed and was otherwise normal.   Past Medical History  Diagnosis Date  . Pulmonary embolism     5 yrs ago (unknown region)  . GERD (gastroesophageal reflux disease)   . HLD  (hyperlipidemia)     Past Surgical History  Procedure Laterality Date  . Cesarean section  1991    [facility]  . Cholecystectomy  15 yrs ago    [facility]  . Ercp w/ sphicterotomy  08/12/11    Dr Rehman-?microlithiasis, SOD  . Ercp  08/12/2011    Procedure: ENDOSCOPIC RETROGRADE CHOLANGIOPANCREATOGRAPHY (ERCP);  Surgeon: Rogene Houston, MD;  Location: [facility];  Service: Endoscopy;  Laterality: N/A;  . Sphincterotomy  08/12/2011    Procedure: SPHINCTEROTOMY;  Surgeon: Rogene Houston, MD;  Location: [facility];  Service: Endoscopy;;  with ballon passage  . Esophagogastroduodenoscopy  11/25/2011    Procedure: ESOPHAGOGASTRODUODENOSCOPY (EGD);  Surgeon: Rogene Houston, MD;  [facility];  Service: Endoscopy;  Laterality: N/A;  830  . Eus  01/19/2012    Procedure: UPPER ENDOSCOPIC ULTRASOUND (EUS) LINEAR;  Surgeon: Milus Banister, MD;  [facility];  Service: Endoscopy;  Laterality: N/A;  pt moved up a hour early by AW , Patty @ [place] office ok'd / pt was called by AW  . Colonoscopy  05/24/2012    Procedure: COLONOSCOPY;  Surgeon: Rogene Houston, MD;  [facility];  Service: Endoscopy;  Laterality: N/A;  220    Current Outpatient Prescriptions  Medication Sig Dispense Refill  . hyoscyamine (LEVSIN SL) 0.125 MG SL tablet TAKE ONE TABLET DAILY AS NEEDED FOR CRAMPING. 90 tablet 3  . ibuprofen (ADVIL,MOTRIN) 200 MG tablet Take 200-400 mg by mouth daily as needed for pain. Pain    . pantoprazole (PROTONIX) 20 MG  tablet Take 1 tablet (20 mg total) by mouth 2 (two) times daily. 60 tablet 3  . simvastatin (ZOCOR) 10 MG tablet Take 1 tablet (10 mg total) by mouth at bedtime. 30 tablet 2   No current facility-administered medications for this visit.    Allergies as of 02/27/2015 - Review Complete 02/27/2015  Allergen Reaction Noted  . Promethazine hcl Hives 08/14/2011  . Hydromorphone Itching 08/08/2011  . Nitrofurantoin monohyd macro Itching 01/19/2012    Family History   Problem Relation Age of Onset  . Diabetes Mother   . Heart failure Father   . Diabetes Brother   . Anesthesia problems Neg Hx   . Hypotension Neg Hx   . Malignant hyperthermia Neg Hx   . Pseudochol deficiency Neg Hx   . Stroke Neg Hx   . Heart disease Sister   . Heart disease Maternal Grandmother   . Cancer Maternal Grandfather     ? pancreatic or lung    History   Social History  . Marital Status: Single    Spouse Name: N/A  . Number of Children: 2  . Years of Education: N/A   Occupational History  . Hydrologist    Social History Main Topics  . Smoking status: Never Smoker   . Smokeless tobacco: Never Used  . Alcohol Use: 0.0 oz/week    0 Standard drinks or equivalent per week     Comment: occasional wine  . Drug Use: No  . Sexual Activity: Yes    Birth Control/ Protection: Post-menopausal   Other Topics Concern  . Not on file   Social History Narrative   Lives w/ 48 yr old daughter.  2 daughters           Physical Exam: BP 112/70 mmHg  Pulse 76  Ht 5\' 4"  (1.626 m)  Wt 176 lb 4 oz (79.946 kg)  BMI 30.24 kg/m2  LMP 04/09/2011 Constitutional: generally well-appearing Psychiatric: alert and oriented x3 Eyes: extraocular movements intact Mouth: oral pharynx moist, no lesions Neck: supple no lymphadenopathy Cardiovascular: heart regular rate and rhythm Lungs: clear to auscultation bilaterally Abdomen: soft, nontender, nondistended, no obvious ascites, no peritoneal signs, normal bowel sounds Extremities: no lower extremity edema bilaterally Skin: no lesions on visible extremities   Assessment and plan: 59 y.o. female with  functional, IBS pains, also GERD  I reassured her that I do not think at all she has anything serious going on. She has had extensive testing including x-ray test, endoscopic tests, lab tests. None appointment anything serious. Her crampy pain lasts only about 2 minutes and is improved with antispasmodic medicines. This is  typical for irritable bowel-like pains. I tried to reassure her of this. She does have some GERD as well, proton pump inhibitor helps and I'm happy to refill this for. She has no alarm symptoms. She will return on an as-needed basis.   Owens Loffler, MD [facility] 02/27/2015, 1:17 PM  Cc: Jearld Fenton, NP

## 2015-02-27 NOTE — Patient Instructions (Signed)
Your pains are likely from IBS, this is common. Take antispasm medicines as needed for cramps. Take protonix is best taken 20-30 min before a meal. Return as needed.

## 2015-03-03 ENCOUNTER — Telehealth: Payer: Self-pay | Admitting: Gastroenterology

## 2015-03-03 DIAGNOSIS — K219 Gastro-esophageal reflux disease without esophagitis: Secondary | ICD-10-CM

## 2015-03-03 MED ORDER — PANTOPRAZOLE SODIUM 20 MG PO TBEC: 20.0000 mg | DELAYED_RELEASE_TABLET | Freq: Two times a day (BID) | ORAL | Status: DC

## 2015-03-03 NOTE — Telephone Encounter (Signed)
Refill has been sent as requested.

## 2015-03-30 ENCOUNTER — Emergency Department (HOSPITAL_COMMUNITY): Payer: Managed Care, Other (non HMO)

## 2015-03-30 ENCOUNTER — Emergency Department (HOSPITAL_COMMUNITY)
Admission: EM | Admit: 2015-03-30 | Discharge: 2015-03-30 | Disposition: A | Discharge status: Home / Self Care | Payer: Managed Care, Other (non HMO) | Attending: Emergency Medicine | Admitting: Emergency Medicine

## 2015-03-30 ENCOUNTER — Encounter (HOSPITAL_COMMUNITY): Payer: Self-pay | Admitting: *Deleted

## 2015-03-30 ENCOUNTER — Telehealth: Payer: Self-pay | Admitting: Internal Medicine

## 2015-03-30 DIAGNOSIS — Z79899 Other long term (current) drug therapy: Secondary | ICD-10-CM | POA: Diagnosis not present

## 2015-03-30 DIAGNOSIS — K219 Gastro-esophageal reflux disease without esophagitis: Secondary | ICD-10-CM | POA: Insufficient documentation

## 2015-03-30 DIAGNOSIS — R42 Dizziness and giddiness: Secondary | ICD-10-CM | POA: Insufficient documentation

## 2015-03-30 DIAGNOSIS — R05 Cough: Secondary | ICD-10-CM | POA: Insufficient documentation

## 2015-03-30 DIAGNOSIS — R079 Chest pain, unspecified: Secondary | ICD-10-CM | POA: Diagnosis not present

## 2015-03-30 DIAGNOSIS — E785 Hyperlipidemia, unspecified: Secondary | ICD-10-CM | POA: Insufficient documentation

## 2015-03-30 DIAGNOSIS — Z86711 Personal history of pulmonary embolism: Secondary | ICD-10-CM | POA: Insufficient documentation

## 2015-03-30 LAB — BASIC METABOLIC PANEL
Anion gap: 6 (ref 5–15)
BUN: 15 mg/dL (ref 6–20)
CHLORIDE: 109 mmol/L (ref 101–111)
CO2: 27 mmol/L (ref 22–32)
Calcium: 9.4 mg/dL (ref 8.9–10.3)
Creatinine, Ser: 0.86 mg/dL (ref 0.44–1.00)
GFR calc Af Amer: >60 mL/min (ref >60)
GFR calc non Af Amer: >60 mL/min (ref >60)
Glucose, Bld: 128 mg/dL — ABNORMAL HIGH (ref 65–99)
Potassium: 3.6 mmol/L (ref 3.5–5.1)
SODIUM: 142 mmol/L (ref 135–145)

## 2015-03-30 LAB — I-STAT TROPONIN, ED: Troponin i, poc: 0 ng/mL (ref 0.00–0.08)

## 2015-03-30 LAB — CBC
HEMATOCRIT: 46.9 % — AB (ref 36.0–46.0)
HEMOGLOBIN: 15.8 g/dL — AB (ref 12.0–15.0)
MCH: 28.4 pg (ref 26.0–34.0)
MCHC: 33.7 g/dL (ref 30.0–36.0)
MCV: 84.2 fL (ref 78.0–100.0)
Platelets: 188 10*3/uL (ref 150–400)
RBC: 5.57 MIL/uL — AB (ref 3.87–5.11)
RDW: 13.5 % (ref 11.5–15.5)
WBC: 7.9 10*3/uL (ref 4.0–10.5)

## 2015-03-30 LAB — BRAIN NATRIURETIC PEPTIDE: B Natriuretic Peptide: 71.3 pg/mL (ref 0.0–100.0)

## 2015-03-30 MED ORDER — IOHEXOL 350 MG/ML SOLN
100.0000 mL | Freq: Once | INTRAVENOUS | Status: AC | PRN
  Administered 2015-03-30: 100 mL via INTRAVENOUS

## 2015-03-30 NOTE — Telephone Encounter (Signed)
Patient Name: Mallory [name]  DOB: 1956-06-03    Initial Comment Caller states, chest started hurting last weekend, that stopped, but now she doesn't feel right. She thinks it could be high blood pressure. Feels tired and has a headache.    Nurse Assessment  Nurse: Leilani Merl, RN, Heather Date/Time (Eastern Time): 03/30/2015 1:57:54 PM  Confirm and document reason for call. If symptomatic, describe symptoms. ---Caller states that she had chest pain off and on Saturday, today her chest does not hurt, but she feels uncomfortable, she has a slight headache  Has the patient traveled out of the country within the last 30 days? ---Not Applicable  Does the patient require triage? ---Yes  Related visit to physician within the last 2 weeks? ---No  Does the PT have any chronic conditions? (i.e. diabetes, asthma, etc.) ---Yes  List chronic conditions. ---high cholesterol, PE     Guidelines    Guideline Title Affirmed Question Affirmed Notes  Chest Pain Dizziness or lightheadedness    Final Disposition User   Go to ED Now [place], RN, [employer]

## 2015-03-30 NOTE — ED Notes (Signed)
Patient transported to CT 

## 2015-03-30 NOTE — ED Notes (Signed)
Pt states that she has had chest pain, headache, and dizziness for several days. Pt states that chest pain is the center and constant.

## 2015-03-30 NOTE — Discharge Instructions (Signed)

## 2015-03-30 NOTE — ED Provider Notes (Signed)
CSN: [id number]     Arrival date & time 03/30/15  1504 History   First MD Initiated Contact with Patient 03/30/15 1657     Chief Complaint  Patient presents with  . Chest Pain  . Dizziness     (Consider location/radiation/quality/duration/timing/severity/associated sxs/prior Treatment) Patient is a 59 y.o. female presenting with chest pain and dizziness.  Chest Pain Pain location:  Substernal area Pain quality: aching   Pain radiates to:  Does not radiate Pain radiates to the back: no   Pain severity:  Mild Onset quality:  Gradual Duration:  3 days Timing:  Constant Progression:  Unchanged Chronicity:  New Context: at rest   Context: not breathing and no trauma   Relieved by:  Nothing Worsened by:  Nothing tried Ineffective treatments:  None tried Associated symptoms: cough (dry cough today) and dizziness   Associated symptoms: no fever, no nausea, no shortness of breath, no syncope and not vomiting   Associated symptoms comment:  Dizziness  Risk factors: prior DVT/PE   Dizziness Associated symptoms: chest pain   Associated symptoms: no nausea, no shortness of breath, no syncope and no vomiting     Past Medical History  Diagnosis Date  . Pulmonary embolism     5 yrs ago (unknown region)  . GERD (gastroesophageal reflux disease)   . HLD (hyperlipidemia)    Past Surgical History  Procedure Laterality Date  . Cesarean section  1991    [facility]  . Cholecystectomy  15 yrs ago    [facility]  . Ercp w/ sphicterotomy  08/12/11    Dr Rehman-?microlithiasis, SOD  . Ercp  08/12/2011    Procedure: ENDOSCOPIC RETROGRADE CHOLANGIOPANCREATOGRAPHY (ERCP);  Surgeon: Rogene Houston, MD;  Location: [facility];  Service: Endoscopy;  Laterality: N/A;  . Sphincterotomy  08/12/2011    Procedure: SPHINCTEROTOMY;  Surgeon: Rogene Houston, MD;  Location: [facility];  Service: Endoscopy;;  with ballon passage  . Esophagogastroduodenoscopy  11/25/2011    Procedure: ESOPHAGOGASTRODUODENOSCOPY (EGD);  Surgeon:  Rogene Houston, MD;  [facility];  Service: Endoscopy;  Laterality: N/A;  830  . Eus  01/19/2012    Procedure: UPPER ENDOSCOPIC ULTRASOUND (EUS) LINEAR;  Surgeon: Milus Banister, MD;  [facility]: [facility];  Service: Endoscopy;  Laterality: N/A;  pt moved up a hour early by AW , Patty @ [place] office ok'd / pt was called by AW  . Colonoscopy  05/24/2012    Procedure: COLONOSCOPY;  Surgeon: Rogene Houston, MD;  [facility];  Service: Endoscopy;  Laterality: N/A;  61   Family History  Problem Relation Age of Onset  . Diabetes Mother   . Heart failure Father   . Diabetes Brother   . Anesthesia problems Neg Hx   . Hypotension Neg Hx   . Malignant hyperthermia Neg Hx   . Pseudochol deficiency Neg Hx   . Stroke Neg Hx   . Heart disease Sister   . Heart disease Maternal Grandmother   . Cancer Maternal Grandfather     ? pancreatic or lung   History  Substance Use Topics  . Smoking status: Never Smoker   . Smokeless tobacco: Never Used  . Alcohol Use: 0.0 oz/week    0 Standard drinks or equivalent per week     Comment: occasional wine   OB History    No data available     Review of Systems  Constitutional: Negative for fever.  Respiratory: Positive for cough (dry cough today). Negative  for shortness of breath.   Cardiovascular: Positive for chest pain. Negative for syncope.  Gastrointestinal: Negative for nausea and vomiting.  Neurological: Positive for dizziness.  All other systems reviewed and are negative.     Allergies  Promethazine hcl; Hydromorphone; and Nitrofurantoin monohyd macro  Home Medications   Prior to Admission medications   Medication Sig Start Date End Date Taking? Authorizing Provider  hyoscyamine (LEVSIN SL) 0.125 MG SL tablet Take 1 tablet (0.125 mg total) by mouth every 4 (four) hours as needed for cramping. 02/27/15  Yes Milus Banister, MD  ibuprofen (ADVIL,MOTRIN) 200 MG tablet Take 400 mg by mouth daily as needed for mild  pain or moderate pain. Pain   Yes Historical Provider, MD  pantoprazole (PROTONIX) 20 MG tablet Take 1 tablet (20 mg total) by mouth 2 (two) times daily. 03/03/15  Yes Milus Banister, MD  simvastatin (ZOCOR) 10 MG tablet Take 1 tablet (10 mg total) by mouth at bedtime. 12/26/14  Yes Jearld Fenton, NP   BP 146/82 mmHg  Pulse 61  Temp(Src) 98 F (36.7 C) (Oral)  Resp 21  SpO2 98%  LMP 04/09/2011 Physical Exam  Constitutional: She is oriented to person, place, and time. She appears well-developed and well-nourished. No distress.  HENT:  Head: Normocephalic.  Eyes: Conjunctivae are normal.  Neck: Neck supple. No tracheal deviation present.  Cardiovascular: Normal rate, regular rhythm and normal heart sounds.   Pulmonary/Chest: Effort normal and breath sounds normal. No respiratory distress.  Abdominal: Soft. She exhibits no distension. There is no tenderness.  Neurological: She is alert and oriented to person, place, and time. She has normal strength. Coordination normal. GCS eye subscore is 4. GCS verbal subscore is 5. GCS motor subscore is 6.  Skin: Skin is warm and dry.  Psychiatric: She has a normal mood and affect.    ED Course  Procedures (including critical care time) Labs Review Labs Reviewed  CBC - Abnormal; Notable for the following:    RBC 5.57 (*)    Hemoglobin 15.8 (*)    HCT 46.9 (*)    All other components within normal limits  BASIC METABOLIC PANEL - Abnormal; Notable for the following:    Glucose, Bld 128 (*)    All other components within normal limits  BRAIN NATRIURETIC PEPTIDE  I-STAT TROPOININ, ED    Imaging Review Dg Chest 2 View  03/30/2015   CLINICAL DATA:  Chest pain, headache, and dizziness for several days.  EXAM: CHEST  2 VIEW  COMPARISON:  Chest radiographs and CTA 06/15/2013  FINDINGS: Cardiac silhouette is upper limits of normal in size, unchanged. Minimal atelectasis or scarring is noted in the lung bases. There is no evidence of segmental  airspace consolidation, edema, pleural effusion, or pneumothorax. Right upper quadrant abdominal surgical clips are noted. No acute osseous abnormality.  IMPRESSION: No active cardiopulmonary disease.   Electronically Signed   By: Logan Bores   On: 03/30/2015 16:39   Ct Angio Chest Pe W/cm &/or Wo Cm  03/30/2015   CLINICAL DATA:  Chest pain and shortness of breath for several days  EXAM: CT ANGIOGRAPHY CHEST WITH CONTRAST  TECHNIQUE: Multidetector CT imaging of the chest was performed using the standard protocol during bolus administration of intravenous contrast. Multiplanar CT image reconstructions and MIPs were obtained to evaluate the vascular anatomy.  CONTRAST:  149mL OMNIPAQUE IOHEXOL 350 MG/ML SOLN  COMPARISON:  None.  FINDINGS: The lungs are well aerated bilaterally. Some very minimal dependent atelectatic  changes are seen. No focal infiltrate or effusion is noted.  The thoracic inlet and thoracic aorta are within normal limits although contrast opacification is limited. The pulmonary artery shows a normal branching pattern without intraluminal filling defect to suggest pulmonary embolism.  The visualized upper abdomen demonstrates pneumobilia likely related to prior sphincterotomy. The gallbladder has been surgically removed. The osseous structures are within normal limits.  Review of the MIP images confirms the above findings.  IMPRESSION: No evidence of pulmonary emboli.  No acute abnormality is noted.   Electronically Signed   By: Inez Catalina M.D.   On: 03/30/2015 19:59     EKG Interpretation   Date/Time:  Monday March 30 2015 15:30:55 EDT Ventricular Rate:  65 PR Interval:  156 QRS Duration: 80 QT Interval:  394 QTC Calculation: 409 R Axis:   58 Text Interpretation:  Normal sinus rhythm Cannot rule out Anterior infarct  , age undetermined Abnormal ECG No significant change since last tracing  Confirmed by YAO  MD, DAVID ([id number]) on 03/30/2015 5:44:31 PM      MDM   Final  diagnoses:  Chest pain, unspecified chest pain type    59 year old female presents with chest pain over the last 3 days that is in the center of her chest and described as a pressure. She has very few risk factors for ACS and her pain does not appear typical for this. A single troponin analysis was ordered for rule out given the prolonged nature of the pain. She states it initially felt like a reflux-related pain but stayed there. She states that during her previous pulmonary embolism she presented initially in this manner and then later became short of breath and was diagnosed on a second emergency department visit. PE scan was ordered for today to rule out thrombosis or embolus and was negative for any acute findings. At this point in time I feel that her chest pain is low risk and she can follow up closely with her primary care physician or return to the emergency Department with any acute worsening of her symptoms.  Leo Grosser, MD 03/31/15 9562  Wandra Arthurs, MD 03/31/15 2106

## 2015-04-01 ENCOUNTER — Ambulatory Visit (INDEPENDENT_AMBULATORY_CARE_PROVIDER_SITE_OTHER): Payer: Managed Care, Other (non HMO) | Admitting: Family Medicine

## 2015-04-01 ENCOUNTER — Ambulatory Visit: Payer: Managed Care, Other (non HMO) | Admitting: Internal Medicine

## 2015-04-01 ENCOUNTER — Encounter: Payer: Self-pay | Admitting: Family Medicine

## 2015-04-01 VITALS — BP 124/72 | HR 56 | Temp 97.8°F | Wt 174.5 lb

## 2015-04-01 DIAGNOSIS — H5712 Ocular pain, left eye: Secondary | ICD-10-CM

## 2015-04-01 NOTE — Assessment & Plan Note (Signed)
New- she thought this was pink eye and therefore came here instead of her eye doctor. Explained that her symptoms certainly could be pink eye but more consistent with a corneal abrasion and needs to be seen ASAP by optho.  Urgent referral placed to get her in with eye doctor today for steroid/abx drops and dilated eye exam. The patient indicates understanding of these issues and agrees with the plan.

## 2015-04-01 NOTE — Progress Notes (Signed)
Subjective:   Patient ID: Mallory [name], female    DOB: June 06, 1956, 59 y.o.   MRN: [id number]  Mallory [name] is a pleasant 59 y.o. year old female pt of Webb Silversmith, new to me, who presents to clinic today with Eye Pain  on 04/01/2015  HPI:  Woke up with "severe left eye pain."  + perfuse watering of her left eye, photophobia and redness.  Wears contact lenses.  Does not remember getting anything in her eye.  No discharge or mattering of her eye lashes this morning. She is complaining of blurred vision as well.  Has never had anything like this.  Current Outpatient Prescriptions on File Prior to Visit  Medication Sig Dispense Refill  . hyoscyamine (LEVSIN SL) 0.125 MG SL tablet Take 1 tablet (0.125 mg total) by mouth every 4 (four) hours as needed for cramping. 90 tablet 3  . ibuprofen (ADVIL,MOTRIN) 200 MG tablet Take 400 mg by mouth daily as needed for mild pain or moderate pain. Pain    . pantoprazole (PROTONIX) 20 MG tablet Take 1 tablet (20 mg total) by mouth 2 (two) times daily. 180 tablet 3  . simvastatin (ZOCOR) 10 MG tablet Take 1 tablet (10 mg total) by mouth at bedtime. 30 tablet 2   No current facility-administered medications on file prior to visit.    Allergies  Allergen Reactions  . Promethazine Hcl Hives  . Hydromorphone Itching  . Nitrofurantoin Monohyd Macro Itching    Past Medical History  Diagnosis Date  . Pulmonary embolism     5 yrs ago (unknown region)  . GERD (gastroesophageal reflux disease)   . HLD (hyperlipidemia)     Past Surgical History  Procedure Laterality Date  . Cesarean section  1991    [facility]  . Cholecystectomy  15 yrs ago    [facility]  . Ercp w/ sphicterotomy  08/12/11    Dr Rehman-?microlithiasis, SOD  . Ercp  08/12/2011    Procedure: ENDOSCOPIC RETROGRADE CHOLANGIOPANCREATOGRAPHY (ERCP);  Surgeon: Rogene Houston, MD;  Location: [facility];  Service: Endoscopy;  Laterality: N/A;  . Sphincterotomy  08/12/2011    Procedure: SPHINCTEROTOMY;  Surgeon:  Rogene Houston, MD;  Location: [facility];  Service: Endoscopy;;  with ballon passage  . Esophagogastroduodenoscopy  11/25/2011    Procedure: ESOPHAGOGASTRODUODENOSCOPY (EGD);  Surgeon: Rogene Houston, MD;  [facility];  Service: Endoscopy;  Laterality: N/A;  830  . Eus  01/19/2012    Procedure: UPPER ENDOSCOPIC ULTRASOUND (EUS) LINEAR;  Surgeon: Milus Banister, MD;  [facility];  Service: Endoscopy;  Laterality: N/A;  pt moved up a hour early by AW , Patty @ [place] office ok'd / pt was called by AW  . Colonoscopy  05/24/2012    Procedure: COLONOSCOPY;  Surgeon: Rogene Houston, MD;  [facility];  Service: Endoscopy;  Laterality: N/A;  58    Family History  Problem Relation Age of Onset  . Diabetes Mother   . Heart failure Father   . Diabetes Brother   . Anesthesia problems Neg Hx   . Hypotension Neg Hx   . Malignant hyperthermia Neg Hx   . Pseudochol deficiency Neg Hx   . Stroke Neg Hx   . Heart disease Sister   . Heart disease Maternal Grandmother   . Cancer Maternal Grandfather     ? pancreatic or lung    History   Social History  . Marital Status: Single    Spouse Name: N/A  .  Number of Children: 2  . Years of Education: N/A   Occupational History  . Hydrologist    Social History Main Topics  . Smoking status: Never Smoker   . Smokeless tobacco: Never Used  . Alcohol Use: 0.0 oz/week    0 Standard drinks or equivalent per week     Comment: occasional wine  . Drug Use: No  . Sexual Activity: Yes    Birth Control/ Protection: Post-menopausal   Other Topics Concern  . Not on file   Social History Narrative   Lives w/ 41 yr old daughter.  2 daughters         The PMH, PSH, Social History, Family History, Medications, and allergies have been reviewed in [facility], and have been updated if relevant.   Review of Systems  Constitutional: Negative.   Eyes: Positive for photophobia, pain, redness and visual disturbance. Negative  for discharge.  All other systems reviewed and are negative.      Objective:    BP 124/72 mmHg  Pulse 56  Temp(Src) 97.8 F (36.6 C) (Oral)  Wt 174 lb 8 oz (79.153 kg)  SpO2 97%  LMP 04/09/2011   Physical Exam  Constitutional: She is oriented to person, place, and time. She appears well-developed and well-nourished. No distress.  HENT:  Head: Normocephalic.  Eyes: Pupils are equal, round, and reactive to light. Left conjunctiva is injected. No scleral icterus.  Cardiovascular: Normal rate.   Pulmonary/Chest: Effort normal.  Neurological: She is alert and oriented to person, place, and time.  Skin: Skin is warm and dry.  Psychiatric: She has a normal mood and affect. Her behavior is normal. Judgment and thought content normal.  Nursing note and vitals reviewed.         Assessment & Plan:   Eye pain, left - Plan: Ambulatory referral to Ophthalmology No Follow-up on file.

## 2015-04-01 NOTE — Patient Instructions (Signed)
Great to meet you. Please stop by to see Marion on your way out. 

## 2015-04-01 NOTE — Progress Notes (Signed)
Pre visit review using our clinic review tool, if applicable. No additional management support is needed unless otherwise documented below in the visit note. 

## 2015-05-04 ENCOUNTER — Telehealth: Payer: Self-pay

## 2015-05-04 NOTE — Telephone Encounter (Signed)
Called and spoke with patient, and notified them that they were due for a Mammogram. Patient wanted information on Mallory [name]. Patient will call and schedule an appointment.

## 2015-05-07 ENCOUNTER — Encounter: Payer: Self-pay | Admitting: *Deleted

## 2015-05-07 NOTE — Progress Notes (Signed)
[facility] MAMMOGRAPHY - Final result (01/28/2014) [place] ([place])

## 2015-05-25 ENCOUNTER — Telehealth: Payer: Self-pay

## 2015-05-25 NOTE — Telephone Encounter (Signed)
PLEASE NOTE: All timestamps contained within this report are represented as [place] Standard Time. CONFIDENTIALTY NOTICE: This fax transmission is intended only for the addressee. It contains information that is legally privileged, confidential or otherwise protected from use or disclosure. If you are not the intended recipient, you are strictly prohibited from reviewing, disclosing, copying using or disseminating any of this information or taking any action in reliance on or regarding this information. If you have received this fax in error, please notify us immediately by telephone so that we can arrange for its return to [place]. Phone: [phone], Toll-Free: [phone], Fax: [phone] Page: 1 of 2 Call Id: [id number] [place] Patient Name: Mallory [name] Gender: Female DOB: Aug 20, 1956 Age: 59 Y 10 M 23 D Return Phone Number: [phone] (Primary) Address: City/State/Zip: [place] [address] Client [facility] - Client Client Site [facility] - Day Physician Webb Silversmith Contact Type Call Call Type Triage / Clinical Relationship To Patient Self Appointment Disposition EMR Appointment Not Necessary Info pasted into Epic Yes Return Phone Number [phone] (Primary) Chief Complaint Vomiting Initial Comment Caller states she has been having stomach pains and vomiting with diarrhea. PreDisposition Home Care Nurse Assessment Nurse: Vallery Sa, RN, [facility] [place] Date/Time Eilene Ghazi Time): 05/25/2015 10:23:53 AM Confirm and document reason for call. If symptomatic, describe symptoms. ---Caller states she developed vomiting (5 episodes) and diarrhea (5 episodes) this morning. Her abdominal pain gets better after an episodes of vomiting and diarrhea. No fever. No blood in her emesis or diarrhea. Has the patient traveled out of the country within the last  30 days? ---No Does the patient require triage? ---Yes Related visit to physician within the last 2 weeks? ---No Does the PT have any chronic conditions? (i.e. diabetes, asthma, etc.) ---Yes List chronic conditions. ---Irritable Bowel Disease Guidelines Guideline Title Affirmed Question Affirmed Notes Nurse Date/Time Eilene Ghazi Time) Vomiting MILD or MODERATE vomiting (e.g., 1 - 5 times / day) (all triage questions negative) [place], RN, [facility] 05/25/2015 10:25:59 AM Diarrhea Mild diarrhea (all triage questions negative) Trumbull, RN, [facility] 05/25/2015 10:30:50 AM Disp. Time Eilene Ghazi Time) Disposition Final User 05/25/2015 10:30:22 AM [place], RN, [facility] [place] 05/25/2015 10:33:38 AM Home Care Yes Trumbull, RN, [place] PLEASE NOTE: All timestamps contained within this report are represented as [place] Standard Time. CONFIDENTIALTY NOTICE: This fax transmission is intended only for the addressee. It contains information that is legally privileged, confidential or otherwise protected from use or disclosure. If you are not the intended recipient, you are strictly prohibited from reviewing, disclosing, copying using or disseminating any of this information or taking any action in reliance on or regarding this information. If you have received this fax in error, please notify us immediately by telephone so that we can arrange for its return to [place]. Phone: [phone], Toll-Free: [phone], Fax: [phone] Page: 2 of 2 Call Id: [id number] [place] Understands: Yes Disagree/Comply: Comply Caller Understands: Yes Disagree/Comply: Comply Care Advice Given Per Guideline HOME CARE: You should be able to treat this at home. REASSURANCE: Vomiting can be caused by many things. It is often caused by a stomach virus (stomach flu) or mild food poisoning. Staying well-hydrated is the most important thing. CLEAR LIQUIDS: Try to sip small amounts (1 tablespoon or 15 ml) of liquid frequently (every 5  minutes) for 8 hours, rather than trying to drink a lot of liquid all at one time. * Sip water or  a rehydration drink (e.g., Gatorade or Powerade). * Other options: 1/2 strength flat lemon-lime soda or ginger ale. * After 4 hours without vomiting, increase the amount. SOLID FOOD: * You may begin eating bland foods after 8 hours without vomiting. * Start with saltine crackers, white bread, rice, mashed potatoes, cereal, applesauce, etc. * You can resume a normal diet in 24-48 hours. AVOID NON-ESSENTIAL MEDS: * Discontinue all vitamins and non-prescription medicines for 24 hours. (Reason: may make vomiting worse.) * Avoid NSAIDs, which can cause gastritis EXPECTED COURSE: Vomiting from viral gastritis (stomach flu) usually stops in 12 to 48 hours. If diarrhea is present, it usually continues for several days. CALL BACK IF: * Vomiting lasts over 48 hours * Signs of dehydration (e.g., no urination over 12 hours, very lightheaded) * You become worse. CARE ADVICE per Vomiting (Adult) guideline. HOME CARE: You should be able to treat this at home. REASSURANCE: * In healthy adults, most new onset diarrhea is caused by a viral infection of the intestines. * Diarrhea is the body's way of getting rid of the germs. FLUID THERAPY DURING MILDMODERATE DIARRHEA: * Drink more fluids, at least 8-10 glasses (8 oz) daily. * Supplement this with saltine crackers or soups, to make certain that you are getting sufficient fluid and salt to meet your body's needs. * Avoid caffeinated beverages (Reason: caffeine is mildly dehydrating). CALL BACK IF: * Signs of dehydration occur (e.g., no urine over 12 hours, very dry mouth, lightheaded, etc.) * Diarrhea persists over 7 days * You become worse. CARE ADVICE given per Diarrhea (Adult) guideline. CONTAGIOUSNESS: * Be certain to wash your hands after using the restroom. EXPECTED COURSE: Viral diarrhea lasts 4-7 days. Always worse on days 1 and 2. After Care Instructions  Given Call Event Type User Date / Time Description

## 2015-07-08 ENCOUNTER — Telehealth: Payer: Self-pay

## 2015-07-08 DIAGNOSIS — E785 Hyperlipidemia, unspecified: Secondary | ICD-10-CM

## 2015-07-08 NOTE — Telephone Encounter (Signed)
Pt left v/m; pt established care 12/23/14; simvastatin causing h/a daily; pt has been taking simvastatin for short period and request different med to take place of simvastatin to [employer]. No future appt scheduled. Pt was supposed to have had repeat lipid 03/2015 per result note.Please advise.

## 2015-07-09 ENCOUNTER — Other Ambulatory Visit: Payer: Self-pay | Admitting: Internal Medicine

## 2015-07-09 MED ORDER — ATORVASTATIN CALCIUM 10 MG PO TABS: 10.0000 mg | ORAL_TABLET | Freq: Every day | ORAL | Status: DC

## 2015-07-09 NOTE — Telephone Encounter (Signed)
Lipitor sent to pharmacy. She needs to repeat CMET and Lipid Profile in 6 weeks (please order future labs)

## 2015-07-10 NOTE — Addendum Note (Signed)
Addended by: Lurlean Nanny on: 07/10/2015 01:42 PM   Modules accepted: Orders

## 2015-07-10 NOTE — Telephone Encounter (Signed)
Pt scheduled lab only appt and labs future ordered

## 2015-08-05 MED ORDER — PRAVASTATIN SODIUM 10 MG PO TABS: 10.0000 mg | ORAL_TABLET | Freq: Every day | ORAL | Status: DC

## 2015-08-05 NOTE — Telephone Encounter (Signed)
Pt is aware of new Rx and will let us know if she does not tolerate new medication---Rx sent to pharmacy

## 2015-08-05 NOTE — Telephone Encounter (Signed)
Pt calling asking for something else besides Lipitor, per pt she's having increased fatigue and she feels worse than when she was on the simvastatin. Please advise

## 2015-08-05 NOTE — Telephone Encounter (Signed)
Send in RX for pravastatin 10 mg daily # 30 ,2 refills

## 2015-08-05 NOTE — Addendum Note (Signed)
Addended by: Lurlean Nanny on: 08/05/2015 04:14 PM   Modules accepted: Orders, Medications

## 2015-08-25 ENCOUNTER — Other Ambulatory Visit (INDEPENDENT_AMBULATORY_CARE_PROVIDER_SITE_OTHER): Payer: Managed Care, Other (non HMO)

## 2015-08-25 DIAGNOSIS — E785 Hyperlipidemia, unspecified: Secondary | ICD-10-CM

## 2015-08-25 LAB — COMPREHENSIVE METABOLIC PANEL
ALK PHOS: 84 U/L (ref 39–117)
ALT: 28 U/L (ref 0–35)
AST: 21 U/L (ref 0–37)
Albumin: 4.1 g/dL (ref 3.5–5.2)
BILIRUBIN TOTAL: 0.6 mg/dL (ref 0.2–1.2)
BUN: 14 mg/dL (ref 6–23)
CALCIUM: 9.6 mg/dL (ref 8.4–10.5)
CO2: 31 meq/L (ref 19–32)
Chloride: 104 mEq/L (ref 96–112)
Creatinine, Ser: 0.95 mg/dL (ref 0.40–1.20)
GFR: 64.41 mL/min (ref >60.00)
Glucose, Bld: 111 mg/dL — ABNORMAL HIGH (ref 70–99)
Potassium: 3.9 mEq/L (ref 3.5–5.1)
Sodium: 142 mEq/L (ref 135–145)
Total Protein: 7 g/dL (ref 6.0–8.3)

## 2015-08-25 LAB — LIPID PANEL
CHOL/HDL RATIO: 4
Cholesterol: 171 mg/dL (ref 0–200)
HDL: 42 mg/dL (ref >39.00)
LDL Cholesterol: 102 mg/dL — ABNORMAL HIGH (ref 0–99)
NonHDL: 128.89
Triglycerides: 136 mg/dL (ref 0.0–149.0)
VLDL: 27.2 mg/dL (ref 0.0–40.0)

## 2015-11-16 ENCOUNTER — Encounter: Payer: Self-pay | Admitting: Internal Medicine

## 2015-11-16 ENCOUNTER — Ambulatory Visit (INDEPENDENT_AMBULATORY_CARE_PROVIDER_SITE_OTHER): Payer: Managed Care, Other (non HMO) | Admitting: Internal Medicine

## 2015-11-16 VITALS — BP 118/80 | HR 75 | Temp 97.5°F | Ht 64.0 in | Wt 168.0 lb

## 2015-11-16 DIAGNOSIS — K219 Gastro-esophageal reflux disease without esophagitis: Secondary | ICD-10-CM

## 2015-11-16 DIAGNOSIS — I2699 Other pulmonary embolism without acute cor pulmonale: Secondary | ICD-10-CM | POA: Diagnosis not present

## 2015-11-16 DIAGNOSIS — E785 Hyperlipidemia, unspecified: Secondary | ICD-10-CM | POA: Diagnosis not present

## 2015-11-16 DIAGNOSIS — Z0000 Encounter for general adult medical examination without abnormal findings: Secondary | ICD-10-CM

## 2015-11-16 LAB — LIPID PANEL
CHOL/HDL RATIO: 4
CHOLESTEROL: 185 mg/dL (ref 0–200)
HDL: 48.6 mg/dL (ref >39.00)
LDL Cholesterol: 121 mg/dL — ABNORMAL HIGH (ref 0–99)
NonHDL: 135.98
Triglycerides: 75 mg/dL (ref 0.0–149.0)
VLDL: 15 mg/dL (ref 0.0–40.0)

## 2015-11-16 LAB — CBC
HCT: 50.6 % — ABNORMAL HIGH (ref 36.0–46.0)
Hemoglobin: 16.7 g/dL — ABNORMAL HIGH (ref 12.0–15.0)
MCHC: 33 g/dL (ref 30.0–36.0)
MCV: 84.4 fl (ref 78.0–100.0)
PLATELETS: 233 10*3/uL (ref 150.0–400.0)
RBC: 5.99 Mil/uL — AB (ref 3.87–5.11)
RDW: 13.9 % (ref 11.5–15.5)
WBC: 9.4 10*3/uL (ref 4.0–10.5)

## 2015-11-16 LAB — COMPREHENSIVE METABOLIC PANEL
ALBUMIN: 4.3 g/dL (ref 3.5–5.2)
ALT: 31 U/L (ref 0–35)
AST: 24 U/L (ref 0–37)
Alkaline Phosphatase: 94 U/L (ref 39–117)
BILIRUBIN TOTAL: 0.6 mg/dL (ref 0.2–1.2)
BUN: 19 mg/dL (ref 6–23)
CALCIUM: 9.8 mg/dL (ref 8.4–10.5)
CHLORIDE: 103 meq/L (ref 96–112)
CO2: 29 mEq/L (ref 19–32)
CREATININE: 0.79 mg/dL (ref 0.40–1.20)
GFR: 79.62 mL/min (ref >60.00)
Glucose, Bld: 89 mg/dL (ref 70–99)
Potassium: 4.1 mEq/L (ref 3.5–5.1)
SODIUM: 140 meq/L (ref 135–145)
TOTAL PROTEIN: 7.4 g/dL (ref 6.0–8.3)

## 2015-11-16 NOTE — Assessment & Plan Note (Signed)
Component of IBS Chronic but stable on Protonix and Levsin Continue to follow with Dr. Ardis Hughs

## 2015-11-16 NOTE — Progress Notes (Signed)
Pre visit review using our clinic review tool, if applicable. No additional management support is needed unless otherwise documented below in the visit note. 

## 2015-11-16 NOTE — Patient Instructions (Signed)

## 2015-11-16 NOTE — Assessment & Plan Note (Signed)
LDL at goal on Pravastatin Will check CMET and Lipid Profile today Encouraged her to consume a low fat diet

## 2015-11-16 NOTE — Assessment & Plan Note (Signed)
No reoccurrence Will continue to monitor 

## 2015-11-16 NOTE — Progress Notes (Signed)
HPI  Pt presents to the clinic today for her annual exam. She is also due for follow up of chronic conditions.  Flu: 09/2015  Tetanus: 12/2014 Mammogram: 07/2015- [facility]  Pap Smear: 07/2014- hx of abnormal pap smear, has scheduled with Dr. Adella Nissen Colon Screening: 2013 Vision Screening: yearly Dentist: as needed  History of PE: She was on Coumadin x 1 year. Has not had any complications since that time.  GERD: She is taking Protonix twice daily. She reports she is having breakthrough symptoms on the Protonix. Certain foods make it worse, such as spicy and greasy foods. She will occasionally take Levsin prn for cramping. She is being followed by Dr. Ardis Hughs with [facility].  HLD: Her last LDL was 102. She denies myalgias on Pravastatin. She tries to consume a low fat.  Diet: She does consume meat. She eats fruits and veggies daily. She tries to avoid fried foods. She drinks mostly coke zero or water. Exercise: None  Past Medical History  Diagnosis Date  . Pulmonary embolism ([place])     5 yrs ago (unknown region)  . GERD (gastroesophageal reflux disease)   . HLD (hyperlipidemia)     Current Outpatient Prescriptions  Medication Sig Dispense Refill  . hyoscyamine (LEVSIN SL) 0.125 MG SL tablet Take 1 tablet (0.125 mg total) by mouth every 4 (four) hours as needed for cramping. 90 tablet 3  . ibuprofen (ADVIL,MOTRIN) 200 MG tablet Take 400 mg by mouth daily as needed for mild pain or moderate pain. Pain    . pantoprazole (PROTONIX) 20 MG tablet Take 1 tablet (20 mg total) by mouth 2 (two) times daily. 180 tablet 3  . pravastatin (PRAVACHOL) 10 MG tablet Take 1 tablet (10 mg total) by mouth daily. 30 tablet 1   No current facility-administered medications for this visit.    Allergies  Allergen Reactions  . Promethazine Hcl Hives  . Hydromorphone Itching  . Nitrofurantoin Monohyd Macro Itching    Family History  Problem Relation Age of Onset  . Diabetes Mother   . Heart failure  Father   . Diabetes Brother   . Anesthesia problems Neg Hx   . Hypotension Neg Hx   . Malignant hyperthermia Neg Hx   . Pseudochol deficiency Neg Hx   . Stroke Neg Hx   . Heart disease Sister   . Heart disease Maternal Grandmother   . Cancer Maternal Grandfather     ? pancreatic or lung    Social History   Social History  . Marital Status: Single    Spouse Name: N/A  . Number of Children: 2  . Years of Education: N/A   Occupational History  . Hydrologist    Social History Main Topics  . Smoking status: Never Smoker   . Smokeless tobacco: Never Used  . Alcohol Use: 0.0 oz/week    0 Standard drinks or equivalent per week     Comment: occasional wine  . Drug Use: No  . Sexual Activity: Yes    Birth Control/ Protection: Post-menopausal   Other Topics Concern  . Not on file   Social History Narrative   Lives w/ 58 yr old daughter.  2 daughters          ROS:  Constitutional: Denies fever, malaise, fatigue, headache or abrupt weight changes.  HEENT: Denies eye pain, eye redness, ear pain, ringing in the ears, wax buildup, runny nose, nasal congestion, bloody nose, or sore throat. Respiratory: Denies difficulty breathing, shortness of breath, cough  or sputum production.   Cardiovascular: Denies chest pain, chest tightness, palpitations or swelling in the hands or feet.  Gastrointestinal: Pt reports reflux and abdominal cramping. Denies bloating, constipation, diarrhea or blood in the stool.  GU: Pt reports stress incontinence. Denies frequency, urgency, pain with urination, blood in urine, odor or discharge. Musculoskeletal: Denies decrease in range of motion, difficulty with gait, muscle pain or joint pain and swelling.  Skin: Denies redness, rashes, lesions or ulcercations.  Neurological: Denies dizziness, difficulty with memory, difficulty with speech or problems with balance and coordination.  Psych: She denies anxiety, depression, SI/HI.  No other  specific complaints in a complete review of systems (except as listed in HPI above).  PE:  Ht 5\' 4"  (1.626 m)  Wt 168 lb (76.204 kg)  BMI 28.82 kg/m2  LMP 04/09/2011   Wt Readings from Last 3 Encounters:  11/16/15 168 lb (76.204 kg)  04/01/15 174 lb 8 oz (79.153 kg)  02/27/15 176 lb 4 oz (79.946 kg)    General: Appears her stated age, obese in NAD. HEENT: Head: normal shape and size; Eyes: sclera white, no icterus, conjunctiva pink, PERRLA and EOMs intact; Ears: Tm's gray and intact, normal light reflex; Throat/Mouth: Teeth present, mucosa pink and moist, no lesions or ulcerations noted.  Neck:  Neck supple, trachea midline. No masses, lumps or thyromegaly present.  Cardiovascular: Normal rate and rhythm. S1,S2 noted.  No murmur, rubs or gallops noted. No JVD or BLE edema. No carotid bruits noted. Pulmonary/Chest: Normal effort and positive vesicular breath sounds. No respiratory distress. No wheezes, rales or ronchi noted.  Abdomen: Soft and nontender. Normal bowel sounds. No distention or masses noted. Liver, spleen and kidneys non palpable. Musculoskeletal: Normal range of motion. Strength 5/5 BUE/BLE. No signs of joint swelling. No difficulty with gait.  Neurological: Alert and oriented. Cranial nerves II-XII grossly intact. Coordination normal.  Psychiatric: Mood and affect normal. Behavior is normal. Judgment and thought content normal.    BMET    Component Value Date/Time   NA 142 08/25/2015 0759   K 3.9 08/25/2015 0759   CL 104 08/25/2015 0759   CO2 31 08/25/2015 0759   GLUCOSE 111* 08/25/2015 0759   BUN 14 08/25/2015 0759   CREATININE 0.95 08/25/2015 0759   CREATININE 0.85 11/03/2011 1205   CALCIUM 9.6 08/25/2015 0759   GFRNONAA >60 03/30/2015 1536   GFRAA >60 03/30/2015 1536    Lipid Panel     Component Value Date/Time   CHOL 171 08/25/2015 0759   TRIG 136.0 08/25/2015 0759   HDL 42.00 08/25/2015 0759   CHOLHDL 4 08/25/2015 0759   VLDL 27.2 08/25/2015 0759    LDLCALC 102* 08/25/2015 0759    CBC    Component Value Date/Time   WBC 7.9 03/30/2015 1536   RBC 5.57* 03/30/2015 1536   HGB 15.8* 03/30/2015 1536   HCT 46.9* 03/30/2015 1536   PLT 188 03/30/2015 1536   MCV 84.2 03/30/2015 1536   MCH 28.4 03/30/2015 1536   MCHC 33.7 03/30/2015 1536   RDW 13.5 03/30/2015 1536   LYMPHSABS 2.6 12/12/2013 2101   MONOABS 0.8 12/12/2013 2101   EOSABS 0.2 12/12/2013 2101   BASOSABS 0.0 12/12/2013 2101    Hgb A1C Lab Results  Component Value Date   HGBA1C 6.3 12/23/2014     Assessment and Plan:  Preventative Health Maintenance:  Flu shot UTD Tetanus UTD Colon Screening, Mammogram and Pap Smear UTD Encouraged her to visit a dentist and eye doctor on at least  a yearly basis Encouraged her to work on diet and exercise Will check CBC, CMET, Lipid today  RTC in 1 year or sooner if needed

## 2015-11-17 ENCOUNTER — Other Ambulatory Visit: Payer: Self-pay | Admitting: Internal Medicine

## 2015-11-17 MED ORDER — PRAVASTATIN SODIUM 10 MG PO TABS: 10.0000 mg | ORAL_TABLET | Freq: Every day | ORAL | Status: DC

## 2015-11-20 ENCOUNTER — Telehealth: Payer: Self-pay

## 2015-11-20 NOTE — Telephone Encounter (Signed)
Wellness form for insurance completed and faxed to Christella Scheuermann [id number] send original to pt via mail

## 2016-01-07 ENCOUNTER — Encounter: Payer: Self-pay | Admitting: Internal Medicine

## 2016-01-07 ENCOUNTER — Ambulatory Visit (INDEPENDENT_AMBULATORY_CARE_PROVIDER_SITE_OTHER): Payer: Managed Care, Other (non HMO) | Admitting: Internal Medicine

## 2016-01-07 VITALS — BP 118/84 | HR 76 | Temp 97.8°F | Wt 173.0 lb

## 2016-01-07 DIAGNOSIS — J069 Acute upper respiratory infection, unspecified: Secondary | ICD-10-CM | POA: Diagnosis not present

## 2016-01-07 DIAGNOSIS — B9789 Other viral agents as the cause of diseases classified elsewhere: Principal | ICD-10-CM

## 2016-01-07 MED ORDER — HYDROCOD POLST-CPM POLST ER 10-8 MG/5ML PO SUER: 5.0000 mL | Freq: Every evening | ORAL | Status: DC | PRN

## 2016-01-07 NOTE — Progress Notes (Signed)
HPI  Pt presents to the clinic today with c/o headache, runny nose, sore throat and cough. This started 5 days ago. She is blowing clear mucous out of her nose. The cough is productive of yellow mucous. She denies fever, chills or shortness of breath. She has taken Thera Flu, Mucinex with minimal relief. She has no history of allergies or breathing problems. She has not had sick contacts that she is aware of. She did get her flu shot.  Review of Systems      Past Medical History  Diagnosis Date  . Pulmonary embolism ([place])     5 yrs ago (unknown region)  . GERD (gastroesophageal reflux disease)   . HLD (hyperlipidemia)     Family History  Problem Relation Age of Onset  . Diabetes Mother   . Heart failure Father   . Diabetes Brother   . Anesthesia problems Neg Hx   . Hypotension Neg Hx   . Malignant hyperthermia Neg Hx   . Pseudochol deficiency Neg Hx   . Stroke Neg Hx   . Heart disease Sister   . Heart disease Maternal Grandmother   . Cancer Maternal Grandfather     ? pancreatic or lung    Social History   Social History  . Marital Status: Single    Spouse Name: N/A  . Number of Children: 2  . Years of Education: N/A   Occupational History  . Hydrologist    Social History Main Topics  . Smoking status: Never Smoker   . Smokeless tobacco: Never Used  . Alcohol Use: 0.0 oz/week    0 Standard drinks or equivalent per week     Comment: occasional wine  . Drug Use: No  . Sexual Activity: Yes    Birth Control/ Protection: Post-menopausal   Other Topics Concern  . Not on file   Social History Narrative   Lives w/ 30 yr old daughter.  2 daughters          Allergies  Allergen Reactions  . Promethazine Hcl Hives  . Hydromorphone Itching  . Nitrofurantoin Monohyd Macro Itching     Constitutional: Positive headache. Denies fatigue, fever or abrupt weight changes.  HEENT:  Positive runny nose, sore throat. Denies eye redness, eye pain, pressure behind  the eyes, facial pain, nasal congestion, ear pain, ringing in the ears, wax buildup, or bloody nose. Respiratory: Positive cough. Denies difficulty breathing or shortness of breath.  Cardiovascular: Denies chest pain, chest tightness, palpitations or swelling in the hands or feet.   No other specific complaints in a complete review of systems (except as listed in HPI above).  Objective:   BP 118/84 mmHg  Pulse 76  Temp(Src) 97.8 F (36.6 C) (Oral)  Wt 173 lb (78.472 kg)  SpO2 98%  LMP 04/09/2011 Wt Readings from Last 3 Encounters:  01/07/16 173 lb (78.472 kg)  11/16/15 168 lb (76.204 kg)  04/01/15 174 lb 8 oz (79.153 kg)     General: Appears her stated age, ill appearing, in NAD. HEENT: Head: normal shape and size, no sinus tenderness noted; Eyes: sclera white, no icterus, conjunctiva pink; Ears: Tm's pink and intact, normal light reflex; Nose: mucosa pink and moist, septum midline; Throat/Mouth: Teeth present, mucosa erythematous and moist, no exudate noted, no lesions or ulcerations noted.  Neck: No cervical lymphadenopathy.  Cardiovascular: Normal rate and rhythm. S1,S2 noted.  No murmur, rubs or gallops noted.  Pulmonary/Chest: Normal effort and positive vesicular breath sounds. No respiratory distress. No  wheezes, rales or ronchi noted.      Assessment & Plan:   Viral Upper Respiratory Infection with Cough:  Get some rest and drink plenty of water Do salt water gargles for the sore throat Continue Mucinex Add in Flonase Rx for Tussionex cough syrup  RTC as needed or if symptoms persist.

## 2016-01-07 NOTE — Patient Instructions (Signed)
Upper Respiratory Infection, Adult Most upper respiratory infections (URIs) are a viral infection of the air passages leading to the lungs. A URI affects the nose, throat, and upper air passages. The most common type of URI is nasopharyngitis and is typically referred to as "the common cold." URIs run their course and usually go away on their own. Most of the time, a URI does not require medical attention, but sometimes a bacterial infection in the upper airways can follow a viral infection. This is called a secondary infection. Sinus and middle ear infections are common types of secondary upper respiratory infections. Bacterial pneumonia can also complicate a URI. A URI can worsen asthma and chronic obstructive pulmonary disease (COPD). Sometimes, these complications can require emergency medical care and may be life threatening.  CAUSES Almost all URIs are caused by viruses. A virus is a type of germ and can spread from one person to another.  RISKS FACTORS You may be at risk for a URI if:   You smoke.   You have chronic heart or lung disease.  You have a weakened defense (immune) system.   You are very young or very old.   You have nasal allergies or asthma.  You work in crowded or poorly ventilated areas.  You work in health care facilities or schools. SIGNS AND SYMPTOMS  Symptoms typically develop 2-3 days after you come in contact with a cold virus. Most viral URIs last 7-10 days. However, viral URIs from the influenza virus (flu virus) can last 14-18 days and are typically more severe. Symptoms may include:   Runny or stuffy (congested) nose.   Sneezing.   Cough.   Sore throat.   Headache.   Fatigue.   Fever.   Loss of appetite.   Pain in your forehead, behind your eyes, and over your cheekbones (sinus pain).  Muscle aches.  DIAGNOSIS  Your health care provider may diagnose a URI by:  Physical exam.  Tests to check that your symptoms are not due to  another condition such as:  Strep throat.  Sinusitis.  Pneumonia.  Asthma. TREATMENT  A URI goes away on its own with time. It cannot be cured with medicines, but medicines may be prescribed or recommended to relieve symptoms. Medicines may help:  Reduce your fever.  Reduce your cough.  Relieve nasal congestion. HOME CARE INSTRUCTIONS   Take medicines only as directed by your health care provider.   Gargle warm saltwater or take cough drops to comfort your throat as directed by your health care provider.  Use a warm mist humidifier or inhale steam from a shower to increase air moisture. This may make it easier to breathe.  Drink enough fluid to keep your urine clear or pale yellow.   Eat soups and other clear broths and maintain good nutrition.   Rest as needed.   Return to work when your temperature has returned to normal or as your health care provider advises. You may need to stay home longer to avoid infecting others. You can also use a face mask and careful hand washing to prevent spread of the virus.  Increase the usage of your inhaler if you have asthma.   Do not use any tobacco products, including cigarettes, chewing tobacco, or electronic cigarettes. If you need help quitting, ask your health care provider. PREVENTION  The best way to protect yourself from getting a cold is to practice good hygiene.   Avoid oral or hand contact with people with cold   symptoms.   Wash your hands often if contact occurs.  There is no clear evidence that vitamin C, vitamin E, echinacea, or exercise reduces the chance of developing a cold. However, it is always recommended to get plenty of rest, exercise, and practice good nutrition.  SEEK MEDICAL CARE IF:   You are getting worse rather than better.   Your symptoms are not controlled by medicine.   You have chills.  You have worsening shortness of breath.  You have brown or red mucus.  You have yellow or brown nasal  discharge.  You have pain in your face, especially when you bend forward.  You have a fever.  You have swollen neck glands.  You have pain while swallowing.  You have white areas in the back of your throat. SEEK IMMEDIATE MEDICAL CARE IF:   You have severe or persistent:  Headache.  Ear pain.  Sinus pain.  Chest pain.  You have chronic lung disease and any of the following:  Wheezing.  Prolonged cough.  Coughing up blood.  A change in your usual mucus.  You have a stiff neck.  You have changes in your:  Vision.  Hearing.  Thinking.  Mood. MAKE SURE YOU:   Understand these instructions.  Will watch your condition.  Will get help right away if you are not doing well or get worse.   This information is not intended to replace advice given to you by your health care provider. Make sure you discuss any questions you have with your health care provider.   Document Released: 03/22/2001 Document Revised: 02/10/2015 Document Reviewed: 01/01/2014 Elsevier Interactive Patient Education 2016 [employer]  

## 2016-01-07 NOTE — Progress Notes (Signed)
Pre visit review using our clinic review tool, if applicable. No additional management support is needed unless otherwise documented below in the visit note. 

## 2016-01-11 ENCOUNTER — Telehealth: Payer: Self-pay

## 2016-01-11 NOTE — Telephone Encounter (Signed)
Pt left v/m pt was seen 01/07/16 and pt taking tussionex; cough is no better and pt wants to know what else can do or take for the cough. Pt request cb. [place].

## 2016-01-11 NOTE — Telephone Encounter (Signed)
Left message on voicemail.

## 2016-01-11 NOTE — Telephone Encounter (Signed)
Is she still coughing up yellow mucous. Any fever, chills or shortness of breath. Verify doing Mucinex and Flonase as well.

## 2016-02-19 ENCOUNTER — Other Ambulatory Visit: Payer: Self-pay | Admitting: Gastroenterology

## 2016-03-24 ENCOUNTER — Telehealth: Payer: Self-pay | Admitting: Gastroenterology

## 2016-03-24 NOTE — Telephone Encounter (Signed)
The pt states she has had shoulder pain and pain under her left breast that is sore to touch.  No other symptoms.  She has had this same pain before.  She thought she was constipated and used an enema and the pain persisted. She was advised to follow up with PCP and call back if they feel she needs to see GI.Marland Kitchen  If the pain becomes worse or any other symptoms develop including shortness of breath, dizziness, or chest pain she will go to the ED for evaluation.

## 2016-03-25 ENCOUNTER — Telehealth: Payer: Self-pay

## 2016-03-25 ENCOUNTER — Ambulatory Visit (INDEPENDENT_AMBULATORY_CARE_PROVIDER_SITE_OTHER): Payer: Managed Care, Other (non HMO) | Admitting: Primary Care

## 2016-03-25 ENCOUNTER — Encounter: Payer: Self-pay | Admitting: Primary Care

## 2016-03-25 VITALS — BP 120/82 | HR 60 | Temp 97.8°F | Ht 64.0 in | Wt 171.4 lb

## 2016-03-25 DIAGNOSIS — R1010 Upper abdominal pain, unspecified: Secondary | ICD-10-CM

## 2016-03-25 LAB — COMPREHENSIVE METABOLIC PANEL
ALBUMIN: 4.2 g/dL (ref 3.5–5.2)
ALT: 26 U/L (ref 0–35)
AST: 19 U/L (ref 0–37)
Alkaline Phosphatase: 86 U/L (ref 39–117)
BILIRUBIN TOTAL: 0.4 mg/dL (ref 0.2–1.2)
BUN: 16 mg/dL (ref 6–23)
CHLORIDE: 104 meq/L (ref 96–112)
CO2: 32 meq/L (ref 19–32)
CREATININE: 0.83 mg/dL (ref 0.40–1.20)
Calcium: 9.8 mg/dL (ref 8.4–10.5)
GFR: 75.12 mL/min (ref >60.00)
Glucose, Bld: 95 mg/dL (ref 70–99)
Potassium: 4.1 mEq/L (ref 3.5–5.1)
SODIUM: 140 meq/L (ref 135–145)
Total Protein: 7.2 g/dL (ref 6.0–8.3)

## 2016-03-25 LAB — CBC WITH DIFFERENTIAL/PLATELET
BASOS ABS: 0 10*3/uL (ref 0.0–0.1)
BASOS PCT: 0.5 % (ref 0.0–3.0)
Eosinophils Absolute: 0.2 10*3/uL (ref 0.0–0.7)
Eosinophils Relative: 2 % (ref 0.0–5.0)
HEMATOCRIT: 46.8 % — AB (ref 36.0–46.0)
Hemoglobin: 15.8 g/dL — ABNORMAL HIGH (ref 12.0–15.0)
LYMPHS ABS: 2.6 10*3/uL (ref 0.7–4.0)
Lymphocytes Relative: 31.5 % (ref 12.0–46.0)
MCHC: 33.7 g/dL (ref 30.0–36.0)
MCV: 83.4 fl (ref 78.0–100.0)
MONOS PCT: 8.4 % (ref 3.0–12.0)
Monocytes Absolute: 0.7 10*3/uL (ref 0.1–1.0)
Neutro Abs: 4.7 10*3/uL (ref 1.4–7.7)
Neutrophils Relative %: 57.6 % (ref 43.0–77.0)
Platelets: 247 10*3/uL (ref 150.0–400.0)
RBC: 5.61 Mil/uL — ABNORMAL HIGH (ref 3.87–5.11)
RDW: 14.2 % (ref 11.5–15.5)
WBC: 8.1 10*3/uL (ref 4.0–10.5)

## 2016-03-25 LAB — LIPASE: LIPASE: 28 U/L (ref 11.0–59.0)

## 2016-03-25 LAB — H. PYLORI ANTIBODY, IGG: H Pylori IgG: NEGATIVE

## 2016-03-25 NOTE — Telephone Encounter (Signed)
Pt has appt with Allie Bossier NP on 03/25/16 at 10 AM.

## 2016-03-25 NOTE — Telephone Encounter (Signed)
PLEASE NOTE: All timestamps contained within this report are represented as [place] Standard Time. CONFIDENTIALTY NOTICE: This fax transmission is intended only for the addressee. It contains information that is legally privileged, confidential or otherwise protected from use or disclosure. If you are not the intended recipient, you are strictly prohibited from reviewing, disclosing, copying using or disseminating any of this information or taking any action in reliance on or regarding this information. If you have received this fax in error, please notify us immediately by telephone so that we can arrange for its return to [place]. Phone: [phone], Toll-Free: [phone], Fax: [phone] Page: 1 of 2 Call Id: [id number] [place] Patient Name: OLLA [name] Gender: Female DOB: 04-03-1956 Age: 60 Y 21 M 23 D Return Phone Number: [phone] (Primary) Address: City/State/Zip: [place] Client [place] [facility] - Client Client Site [place] Physician Webb Silversmith - NP Contact Type Call Who Is Calling Patient / Member / Family / Caregiver Call Type Triage / Clinical Relationship To Patient Self Return Phone Number [phone] (Primary) Chief Complaint Breast Symptoms Reason for Call Symptomatic / Request for [place] has soreness under left breast. PreDisposition Call Doctor Translation No Nurse Assessment Nurse: Laurena Bering, RN, Helene Kelp Date/Time (Eastern Time): 03/24/2016 5:03:36 PM Confirm and document reason for call. If symptomatic, describe symptoms. You must click the next button to save text entered. ---Caller states that she is having soreness under left breast. No redness or bruising Has the patient traveled out of the country within the last 30 days? ---No Does the patient have any new or worsening  symptoms? ---Yes Will a triage be completed? ---Yes Related visit to physician within the last 2 weeks? ---No Does the PT have any chronic conditions? (i.e. diabetes, asthma, etc.) ---Yes List chronic conditions. ---ibs Is this a behavioral health or substance abuse call? ---No Guidelines Guideline Title Affirmed Question Affirmed Notes Nurse Date/Time Eilene Ghazi Time) Chest Injury [1] Chest wall swelling or pain AND [2] present > 7 days Milton Ferguson 03/24/2016 5:06:21 PM Disp. Time Eilene Ghazi Time) Disposition Final User 03/24/2016 5:08:43 PM See PCP When Office is Open (within 3 days) Yes Laurena Bering, RN, Helene Kelp PLEASE NOTE: All timestamps contained within this report are represented as [place] Standard Time. CONFIDENTIALTY NOTICE: This fax transmission is intended only for the addressee. It contains information that is legally privileged, confidential or otherwise protected from use or disclosure. If you are not the intended recipient, you are strictly prohibited from reviewing, disclosing, copying using or disseminating any of this information or taking any action in reliance on or regarding this information. If you have received this fax in error, please notify us immediately by telephone so that we can arrange for its return to [place]. Phone: [phone], Toll-Free: [phone], Fax: [phone] Page: 2 of 2 Call Id: [id number] Caller Understands: Yes Disagree/Comply: Comply Care Advice Given Per Guideline SEE PCP WITHIN 3 DAYS: PAIN MEDICINES: ACETAMINOPHEN (E.G., TYLENOL): CALL BACK IF: * You become worse. CARE ADVICE given per Chest Injury (Adult) guideline. * For pain relief, take acetaminophen, ibuprofen, or naproxen. * You need to be seen within 2 or 3 days. Call your doctor during regular office hours and make an appointment. An urgent care center is often the best source of care if your doctor's office is closed or you can't get an appointment. NOTE: If office will be  open tomorrow, tell  caller to call then, not in 3 days. * Use the lowest amount that makes your pain feel better. Referrals REFERRED TO PCP OFFICE

## 2016-03-25 NOTE — Patient Instructions (Signed)
Complete lab work prior to leaving today. I will notify you of your results once received.   Start Zantac 150 mg once daily for 2 weeks. Continue your pantoprazole.  I will be in touch later today or Monday.   It was a pleasure meeting you!

## 2016-03-25 NOTE — Telephone Encounter (Signed)
Noted. Evaluated.

## 2016-03-25 NOTE — Progress Notes (Signed)
Subjective:    Patient ID: Mallory [name], female    DOB: 1956-09-11, 60 y.o.   MRN: [id number]  HPI  Mallory [name] is a 60 year old female who presents today with a chief complaint of abdominal pain. The pain is located to the LUQ and mid lower abdomen. She has a history of IBS and takes hyoscyamine. She describes her LUQ pain as "sore" to the touch and her lower quadrant pain feels like burning. She first noticed the discomfort Sunday this week which has not improvement.   She is currently managed on pantoprazole 20 mg and has been taking ibuprofen temporary improvement. Eating and drinking does not effect her pain. She does not have her gallbladder.   Denies nausea, vomiting, diarrhea, increased stress, changes in diet, any new medications, injury/trauma. Her last bowel movement last night. She did use an Enema (with success) one night last week as she felt bloated and thought her symptoms were related to constipation, but this did not help with abdominal pain. She's recently been walking more than she typically does since Wednesday last week.  Review of Systems  Constitutional: Negative for fever, chills, fatigue and unexpected weight change.  Gastrointestinal: Positive for abdominal pain. Negative for nausea, vomiting, diarrhea and constipation.  Genitourinary: Negative for dysuria.       Past Medical History  Diagnosis Date  . Pulmonary embolism ([place])     5 yrs ago (unknown region)  . GERD (gastroesophageal reflux disease)   . HLD (hyperlipidemia)      Social History   Social History  . Marital Status: Single    Spouse Name: N/A  . Number of Children: 2  . Years of Education: N/A   Occupational History  . Hydrologist    Social History Main Topics  . Smoking status: Never Smoker   . Smokeless tobacco: Never Used  . Alcohol Use: 0.0 oz/week    0 Standard drinks or equivalent per week     Comment: occasional wine  . Drug Use: No  . Sexual Activity: Yes    Birth  Control/ Protection: Post-menopausal   Other Topics Concern  . Not on file   Social History Narrative   Lives w/ 59 yr old daughter.  2 daughters          Past Surgical History  Procedure Laterality Date  . Cesarean section  1991    mc  . Cholecystectomy  15 yrs ago    [facility]  . Ercp w/ sphicterotomy  08/12/11    Dr Rehman-?microlithiasis, SOD  . Ercp  08/12/2011    Procedure: ENDOSCOPIC RETROGRADE CHOLANGIOPANCREATOGRAPHY (ERCP);  Surgeon: Rogene Houston, MD;  Location: [facility];  Service: Endoscopy;  Laterality: N/A;  . Sphincterotomy  08/12/2011    Procedure: SPHINCTEROTOMY;  Surgeon: Rogene Houston, MD;  Location: [facility];  Service: Endoscopy;;  with ballon passage  . Esophagogastroduodenoscopy  11/25/2011    Procedure: ESOPHAGOGASTRODUODENOSCOPY (EGD);  Surgeon: Rogene Houston, MD;  [facility];  Service: Endoscopy;  Laterality: N/A;  830  . Eus  01/19/2012    Procedure: UPPER ENDOSCOPIC ULTRASOUND (EUS) LINEAR;  Surgeon: Milus Banister, MD;  [facility];  Service: Endoscopy;  Laterality: N/A;  pt moved up a hour early by AW , Patty @ [place] office ok'd / pt was called by AW  . Colonoscopy  05/24/2012    Procedure: COLONOSCOPY;  Surgeon: Rogene Houston, MD;  [facility];  Service: Endoscopy;  Laterality:  N/A;  220    Family History  Problem Relation Age of Onset  . Diabetes Mother   . Heart failure Father   . Diabetes Brother   . Anesthesia problems Neg Hx   . Hypotension Neg Hx   . Malignant hyperthermia Neg Hx   . Pseudochol deficiency Neg Hx   . Stroke Neg Hx   . Heart disease Sister   . Heart disease Maternal Grandmother   . Cancer Maternal Grandfather     ? pancreatic or lung    Allergies  Allergen Reactions  . Promethazine Hcl Hives  . Hydromorphone Itching  . Nitrofurantoin Monohyd Macro Itching    Current Outpatient Prescriptions on File Prior to Visit  Medication Sig Dispense Refill  . hyoscyamine (LEVSIN SL) 0.125 MG  SL tablet TAKE ONE TABLET DAILY EVERY 4 HOURS AS NEEDED FOR CRAMPING. 90 tablet 0  . ibuprofen (ADVIL,MOTRIN) 200 MG tablet Take 400 mg by mouth daily as needed for mild pain or moderate pain. Pain    . pantoprazole (PROTONIX) 20 MG tablet Take 1 tablet (20 mg total) by mouth 2 (two) times daily. 180 tablet 3  . pravastatin (PRAVACHOL) 10 MG tablet Take 1 tablet (10 mg total) by mouth daily. 30 tablet 5   No current facility-administered medications on file prior to visit.    BP 120/82 mmHg  Pulse 60  Temp(Src) 97.8 F (36.6 C) (Oral)  Ht 5\' 4"  (1.626 m)  Wt 171 lb 6.4 oz (77.747 kg)  BMI 29.41 kg/m2  SpO2 98%  LMP 04/09/2011    Objective:   Physical Exam  Constitutional: She appears well-nourished.  Cardiovascular: Normal rate and regular rhythm.   Pulmonary/Chest: Effort normal and breath sounds normal.  Abdominal: Soft. Bowel sounds are normal. There is tenderness in the right upper quadrant, epigastric area and left upper quadrant. There is no rebound and no CVA tenderness.  Skin: Skin is warm and dry.          Assessment & Plan:  Abdominal pain:  Located to left upper quadrant and mid lower abdomen since Sunday this week. Exam with tenderness to right upper quadrant, epigastric, left upper quadrant regions of abdomen. She had a cholecystectomy over 15 years ago. She does not appear acutely ill or toxic. She does have a history of IBS and GERD, would like to rule out other causes of discomfort. CMP, CBC with differential, lipase, H. pylori pending. Will also have her take Zantac 150 milligrams with her pantoprazole once daily for the next 2 weeks. If no improvement then we'll consider abdominal ultrasound. Emergency department precautions provided if symptoms persist over the weekend.

## 2016-03-25 NOTE — Progress Notes (Signed)
Pre visit review using our clinic review tool, if applicable. No additional management support is needed unless otherwise documented below in the visit note. 

## 2016-05-03 ENCOUNTER — Other Ambulatory Visit: Payer: Self-pay | Admitting: Gastroenterology

## 2016-05-03 DIAGNOSIS — K219 Gastro-esophageal reflux disease without esophagitis: Secondary | ICD-10-CM

## 2016-06-27 ENCOUNTER — Other Ambulatory Visit: Payer: Self-pay | Admitting: Gastroenterology

## 2016-06-27 DIAGNOSIS — K219 Gastro-esophageal reflux disease without esophagitis: Secondary | ICD-10-CM

## 2016-07-04 ENCOUNTER — Encounter: Payer: Self-pay | Admitting: Internal Medicine

## 2016-07-04 ENCOUNTER — Ambulatory Visit (INDEPENDENT_AMBULATORY_CARE_PROVIDER_SITE_OTHER): Payer: Managed Care, Other (non HMO) | Admitting: Internal Medicine

## 2016-07-04 VITALS — BP 116/74 | HR 51 | Temp 97.7°F | Wt 165.0 lb

## 2016-07-04 DIAGNOSIS — M542 Cervicalgia: Secondary | ICD-10-CM

## 2016-07-04 DIAGNOSIS — G44211 Episodic tension-type headache, intractable: Secondary | ICD-10-CM | POA: Diagnosis not present

## 2016-07-04 MED ORDER — METHOCARBAMOL 500 MG PO TABS: 500.0000 mg | ORAL_TABLET | Freq: Every evening | ORAL | 0 refills | Status: DC | PRN

## 2016-07-04 MED ORDER — PRAVASTATIN SODIUM 10 MG PO TABS: 10.0000 mg | ORAL_TABLET | Freq: Every day | ORAL | 1 refills | Status: DC

## 2016-07-04 NOTE — Patient Instructions (Signed)

## 2016-07-04 NOTE — Progress Notes (Signed)
Subjective:    Patient ID: Mallory [name], female    DOB: 1956/01/23, 60 y.o.   MRN: [id number]  HPI  Pt presents to the clinic today with c/o left side neck pain. She reports this started 2 months ago. She describes the pain as achy, but it can be sharp at times. The pain radiates into the let shoulder. She denies numbness, tingling or weakness in her left arm. She denies any injury to the area.  She reports she recently got a new pillow to see if that helped, but it has not. She has taken Ibuprofen and Biofreeze without any relief. She has also tried a heating pad with minimal relief. She reports she has been more stressed lately.  She also c/o a headache. This started 3 days ago. The pain is located in her forehead and behind her eyes. She describes the pain as pressure. She denies dizziness, visual changes, sensitivity to light or sound, nausea or vomiting. She denies runny nose, nasal congestion or ear pain She has tried Claritin and Ibuprofen without relief. She did get new contacts in July.  Review of Systems      Past Medical History:  Diagnosis Date  . GERD (gastroesophageal reflux disease)   . HLD (hyperlipidemia)   . Pulmonary embolism ([place])    5 yrs ago (unknown region)    Current Outpatient Prescriptions  Medication Sig Dispense Refill  . hyoscyamine (LEVSIN SL) 0.125 MG SL tablet TAKE ONE TABLET DAILY EVERY 4 HOURS AS NEEDED FOR CRAMPING. 90 tablet 0  . ibuprofen (ADVIL,MOTRIN) 200 MG tablet Take 400 mg by mouth daily as needed for mild pain or moderate pain. Pain    . pantoprazole (PROTONIX) 20 MG tablet TAKE (1) TABLET BY MOUTH TWICE DAILY. 180 tablet 0  . pravastatin (PRAVACHOL) 10 MG tablet Take 1 tablet (10 mg total) by mouth daily. 30 tablet 5   No current facility-administered medications for this visit.     Allergies  Allergen Reactions  . Promethazine Hcl Hives  . Hydromorphone Itching  . Nitrofurantoin Monohyd Macro Itching    Family History  Problem  Relation Age of Onset  . Diabetes Mother   . Heart failure Father   . Diabetes Brother   . Anesthesia problems Neg Hx   . Hypotension Neg Hx   . Malignant hyperthermia Neg Hx   . Pseudochol deficiency Neg Hx   . Stroke Neg Hx   . Heart disease Sister   . Heart disease Maternal Grandmother   . Cancer Maternal Grandfather     ? pancreatic or lung    Social History   Social History  . Marital status: Single    Spouse name: N/A  . Number of children: 2  . Years of education: N/A   Occupational History  . Designer, fashion/clothing   Social History Main Topics  . Smoking status: Never Smoker  . Smokeless tobacco: Never Used  . Alcohol use 0.0 oz/week     Comment: occasional wine  . Drug use: No  . Sexual activity: Yes    Birth control/ protection: Post-menopausal   Other Topics Concern  . Not on file   Social History Narrative   Lives w/ 61 yr old daughter.  2 daughters           Constitutional: Pt reports headache. Denies fever, malaise, fatigue, or abrupt weight changes.  HEENT: Denies eye pain, eye redness, ear pain, ringing in the ears, wax buildup, runny nose, nasal congestion, bloody  nose, or sore throat. Respiratory: Denies difficulty breathing, shortness of breath, cough or sputum production.   Cardiovascular: Denies chest pain, chest tightness, palpitations or swelling in the hands or feet.  Musculoskeletal: Pt reports neck pain. Denies decrease in range of motion, difficulty with gait, or joint swelling.  Skin: Denies redness, rashes, lesions or ulcercations.  Neurological: Denies dizziness, difficulty with memory, difficulty with speech or problems with balance and coordination.  Psych: Pt reports stress. Denies anxiety, depression, SI/HI.  No other specific complaints in a complete review of systems (except as listed in HPI above).  Objective:   Physical Exam  BP 116/74   Pulse (!) 51   Temp 97.7 F (36.5 C) (Oral)   Wt 165 lb (74.8 kg)   LMP  05/11/2011   SpO2 98%   BMI 28.32 kg/m  Wt Readings from Last 3 Encounters:  07/04/16 165 lb (74.8 kg)  03/25/16 171 lb 6.4 oz (77.7 kg)  01/07/16 173 lb (78.5 kg)    General: Appears her stated age, in NAD. Skin: Warm, dry and intact. No rashesons noted. HEENT: Head: normal shape and size; Eyes: sclera white, no icterus, conjunctiva pink, PERRLA and EOMs intact; Ears: Tm's gray and intact, normal light reflex;  Cardiovascular: Normal rate and rhythm. S1,S2 noted.   Pulmonary/Chest: Normal effort and positive vesicular breath sounds. No respiratory distress. No wheezes, rales or ronchi noted.  MSK: Decreased flexion and extension of the cervical spine. Normal rotation and lateral bending. No bony tenderness noted over the spine. Pain with palpation of the left sternocleidomastoid and left trapezius. Normal internal and external rotation of the left shoulder. Strength 5/5 BUE/BLE.   Neurological: Alert and oriented.  Coordination normal.     BMET    Component Value Date/Time   NA 140 03/25/2016 1036   K 4.1 03/25/2016 1036   CL 104 03/25/2016 1036   CO2 32 03/25/2016 1036   GLUCOSE 95 03/25/2016 1036   BUN 16 03/25/2016 1036   CREATININE 0.83 03/25/2016 1036   CREATININE 0.85 11/03/2011 1205   CALCIUM 9.8 03/25/2016 1036   GFRNONAA >60 03/30/2015 1536   GFRAA >60 03/30/2015 1536    Lipid Panel     Component Value Date/Time   CHOL 185 11/16/2015 1513   TRIG 75.0 11/16/2015 1513   HDL 48.60 11/16/2015 1513   CHOLHDL 4 11/16/2015 1513   VLDL 15.0 11/16/2015 1513   LDLCALC 121 (H) 11/16/2015 1513    CBC    Component Value Date/Time   WBC 8.1 03/25/2016 1036   RBC 5.61 (H) 03/25/2016 1036   HGB 15.8 (H) 03/25/2016 1036   HCT 46.8 (H) 03/25/2016 1036   PLT 247.0 03/25/2016 1036   MCV 83.4 03/25/2016 1036   MCH 28.4 03/30/2015 1536   MCHC 33.7 03/25/2016 1036   RDW 14.2 03/25/2016 1036   LYMPHSABS 2.6 03/25/2016 1036   MONOABS 0.7 03/25/2016 1036   EOSABS 0.2  03/25/2016 1036   BASOSABS 0.0 03/25/2016 1036    Hgb A1C Lab Results  Component Value Date   HGBA1C 6.3 12/23/2014       Assessment & Plan:   Neck pain:  Appears to be muscle, not a bony issue Continue Ibuprofen and heat Add in massage eRx for Robaxin 500 QHS prn  Headache:  ? Tension Continue Ibuprofen eRx for Robaxin 500 mg QHS prn  RTC as needed or if symptoms persist or worsen Cerinity Zynda, NP

## 2016-07-11 ENCOUNTER — Telehealth: Payer: Self-pay | Admitting: Internal Medicine

## 2016-07-11 NOTE — Telephone Encounter (Signed)
Spoke to pt and she states that she would like to go ahead with the xray forst--pt is advised that you will order and she can come to the office to have that done

## 2016-07-11 NOTE — Telephone Encounter (Signed)
Our next step would either be xray of the cervical spine or referral to PT. Does she have a preference?

## 2016-07-11 NOTE — Telephone Encounter (Signed)
Pt is still having trouble with her neck. cb number is [phone].

## 2016-07-12 ENCOUNTER — Other Ambulatory Visit: Payer: Self-pay | Admitting: Internal Medicine

## 2016-07-12 DIAGNOSIS — M542 Cervicalgia: Secondary | ICD-10-CM

## 2016-07-12 NOTE — Telephone Encounter (Signed)
X-ray ordered.

## 2016-07-13 ENCOUNTER — Ambulatory Visit (INDEPENDENT_AMBULATORY_CARE_PROVIDER_SITE_OTHER)
Admission: RE | Admit: 2016-07-13 | Discharge: 2016-07-13 | Disposition: A | Discharge status: Home / Self Care | Payer: Managed Care, Other (non HMO) | Source: Ambulatory Visit | Attending: Internal Medicine | Admitting: Internal Medicine

## 2016-07-13 DIAGNOSIS — M542 Cervicalgia: Secondary | ICD-10-CM | POA: Diagnosis not present

## 2016-07-25 ENCOUNTER — Other Ambulatory Visit: Payer: Self-pay | Admitting: Internal Medicine

## 2016-07-25 DIAGNOSIS — M542 Cervicalgia: Secondary | ICD-10-CM

## 2016-08-24 ENCOUNTER — Encounter: Payer: Self-pay | Admitting: Gastroenterology

## 2016-08-24 ENCOUNTER — Ambulatory Visit (INDEPENDENT_AMBULATORY_CARE_PROVIDER_SITE_OTHER): Payer: Managed Care, Other (non HMO) | Admitting: Gastroenterology

## 2016-08-24 VITALS — BP 108/62 | HR 66 | Ht 63.0 in | Wt 162.5 lb

## 2016-08-24 DIAGNOSIS — K219 Gastro-esophageal reflux disease without esophagitis: Secondary | ICD-10-CM | POA: Diagnosis not present

## 2016-08-24 DIAGNOSIS — K589 Irritable bowel syndrome without diarrhea: Secondary | ICD-10-CM | POA: Diagnosis not present

## 2016-08-24 MED ORDER — PANTOPRAZOLE SODIUM 20 MG PO TBEC: DELAYED_RELEASE_TABLET | ORAL | 11 refills | Status: DC

## 2016-08-24 MED ORDER — HYOSCYAMINE SULFATE 0.125 MG SL SUBL: 0.1250 mg | SUBLINGUAL_TABLET | Freq: Two times a day (BID) | SUBLINGUAL | 11 refills | Status: DC

## 2016-08-24 NOTE — Progress Notes (Signed)
08/2011 ERCP with Dr. Laural Golden, for right upper quadrant pain, elevated liver tests. ERCP was normal. He performed biliary sphincterotomy for possible sphincter of Oddi dysfunction.  Complicated by post procedure pains, event. 11/2011 EGD with Dr. Laural Golden for epigastric pain, right upper quadrant pain. Some small polyps were removed from the stomach that were hyperplastic on pathology. The examination was otherwise normal 01/2012 endoscopic ultrasound, Dr. Ardis Hughs, referred by Dr. Jearld Adjutant for evaluation of persistent right upper quadrant pain. The examination was normal. 08/2012 Colonoscopy Dr. Laural Golden, for screening, persistent abdominal pain, essentially normal. 2015 CT scan abdomen for abdominal pain was essentially normal Labs 12/2014 cbc, cmet normal LFTs 2012-2016 persistently normal except mild elevated in single trnasaminase  HPI: This is a chronic GERD, irritable bowel syndrome    Chief complaint is chronic GERD, irritable bowel syndrome  She is taking Protonix 20 mg twice daily and on that regimen her acid symptoms are under very good control. She can often skip the evening dose.   Weight was 176 pounds here in GI office 02/2015  Down 14 pounds intentionally since then.  She is watching what she eats.  She has abd pains in AM, cramping unless she eats very soon afer eating.    She is getting better at managing her IBS cramps.  She will take a sublingual antispasm medicine at the first sign of cramps and this will usually stop them from getting worse. She takes about one sublingual antispasm medicine per day.   ROS: complete GI ROS as described in HPI.  Constitutional:  No unintentional weight loss   Past Medical History:  Diagnosis Date  . GERD (gastroesophageal reflux disease)   . HLD (hyperlipidemia)   . Pulmonary embolism ([place])    5 yrs ago (unknown region)    Past Surgical History:  Procedure Laterality Date  . [place]   mc  . CHOLECYSTECTOMY  15 yrs ago    mc  . COLONOSCOPY  05/24/2012   Procedure: COLONOSCOPY;  Surgeon: Rogene Houston, MD;  [facility];  Service: Endoscopy;  Laterality: N/A;  220  . ERCP  08/12/2011   Procedure: ENDOSCOPIC RETROGRADE CHOLANGIOPANCREATOGRAPHY (ERCP);  Surgeon: Rogene Houston, MD;  Location[facility];  Service: Endoscopy;  Laterality: N/A;  . ERCP W/ SPHICTEROTOMY  08/12/11   Dr Rehman-?microlithiasis, SOD  . ESOPHAGOGASTRODUODENOSCOPY  11/25/2011   Procedure: ESOPHAGOGASTRODUODENOSCOPY (EGD);  Surgeon: Rogene Houston, MD;  [facility];  Service: Endoscopy;  Laterality: N/A;  830  . EUS  01/19/2012   Procedure: UPPER ENDOSCOPIC ULTRASOUND (EUS) LINEAR;  Surgeon: Milus Banister, MD;  [facility];  Service: Endoscopy;  Laterality: N/A;  pt moved up a hour early by AW , Patty @ [place] office ok'd / pt was called by AW  . SPHINCTEROTOMY  08/12/2011   Procedure: SPHINCTEROTOMY;  Surgeon: Rogene Houston, MD;  Location[facility];  Service: Endoscopy;;  with ballon passage    Current Outpatient Prescriptions  Medication Sig Dispense Refill  . hyoscyamine (LEVSIN SL) 0.125 MG SL tablet TAKE ONE TABLET DAILY EVERY 4 HOURS AS NEEDED FOR CRAMPING. 90 tablet 0  . ibuprofen (ADVIL,MOTRIN) 200 MG tablet Take 400 mg by mouth daily as needed for mild pain or moderate pain. Pain    . methocarbamol (ROBAXIN) 500 MG tablet Take 1 tablet (500 mg total) by mouth at bedtime as needed for muscle spasms. 150 tablet 0  . pantoprazole (PROTONIX) 20 MG tablet TAKE (1) TABLET BY  MOUTH TWICE DAILY. 180 tablet 0  . pravastatin (PRAVACHOL) 10 MG tablet Take 1 tablet (10 mg total) by mouth daily. 90 tablet 1   No current facility-administered medications for this visit.     Allergies as of 08/24/2016 - Review Complete 08/24/2016  Allergen Reaction Noted  . Promethazine hcl Hives 08/14/2011  . Hydromorphone Itching 08/08/2011  . Nitrofurantoin monohyd macro Itching 01/19/2012    Family History  Problem  Relation Age of Onset  . Diabetes Mother   . Heart failure Father   . Diabetes Brother   . Heart disease Sister   . Heart disease Maternal Grandmother   . Cancer Maternal Grandfather     ? pancreatic or lung  . Anesthesia problems Neg Hx   . Hypotension Neg Hx   . Malignant hyperthermia Neg Hx   . Pseudochol deficiency Neg Hx   . Stroke Neg Hx     Social History   Social History  . Marital status: Single    Spouse name: N/A  . Number of children: 2  . Years of education: N/A   Occupational History  . Designer, fashion/clothing   Social History Main Topics  . Smoking status: Never Smoker  . Smokeless tobacco: Never Used  . Alcohol use 0.0 oz/week     Comment: occasional wine  . Drug use: No  . Sexual activity: Yes    Birth control/ protection: Post-menopausal   Other Topics Concern  . Not on file   Social History Narrative   Lives w/ 86 yr old daughter.  2 daughters           Physical Exam: BP 108/62   Pulse 66   Ht 5\' 3"  (1.6 m)   Wt 162 lb 8 oz (73.7 kg)   LMP 05/11/2011   BMI 28.79 kg/m  Constitutional: generally well-appearing Psychiatric: alert and oriented x3 Abdomen: soft, nontender, nondistended, no obvious ascites, no peritoneal signs, normal bowel sounds No peripheral edema noted in lower extremities  Assessment and plan: 60 y.o. female with Chronic GERD, irritable bowel syndrome, overweight  She has really been doing well for the past year and a half since her last visit here. She is dieting, watching what she eats. She has lost  about 15 pounds. She is needing less antispasm medicine and less proton pump inhibitor for her irritable bowel cramping in her GERD respectively. I encouraged her to continue these medicines that she's been doing and continue to try to lose a bit more weight. Commended her on her efforts. She will return to see me in one year and sooner if needed and I am happy to refill her medicines now.  Owens Loffler,  MD [facility] 08/24/2016, 3:38 PM

## 2016-08-24 NOTE — Patient Instructions (Addendum)
Refills on levsin SL 0.125, take one twice daily as needed (disp 60 with 11 refills). Refills on the protonix 20mg , take one pill before BF and dinner (disp 60 with 11 refills). Please return to see Dr. Ardis Hughs in  1 year.

## 2016-08-31 ENCOUNTER — Other Ambulatory Visit: Payer: Self-pay | Admitting: Gastroenterology

## 2016-09-05 ENCOUNTER — Ambulatory Visit (INDEPENDENT_AMBULATORY_CARE_PROVIDER_SITE_OTHER): Payer: Managed Care, Other (non HMO) | Admitting: Orthopaedic Surgery

## 2016-09-05 DIAGNOSIS — M25561 Pain in right knee: Secondary | ICD-10-CM | POA: Diagnosis not present

## 2016-09-05 DIAGNOSIS — G8929 Other chronic pain: Secondary | ICD-10-CM | POA: Diagnosis not present

## 2016-09-05 DIAGNOSIS — M1711 Unilateral primary osteoarthritis, right knee: Secondary | ICD-10-CM | POA: Diagnosis not present

## 2016-09-05 MED ORDER — LIDOCAINE HCL 1 % IJ SOLN
3.0000 mL | INTRAMUSCULAR | Status: AC | PRN
  Administered 2016-09-05: 3 mL

## 2016-09-05 MED ORDER — METHYLPREDNISOLONE ACETATE 40 MG/ML IJ SUSP
40.0000 mg | INTRAMUSCULAR | Status: AC | PRN
  Administered 2016-09-05: 40 mg via INTRA_ARTICULAR

## 2016-09-05 NOTE — Progress Notes (Signed)
Office Visit Note   Patient: Mallory [name]           Date of Birth: 1956/03/11           MRN: [id number] Visit Date: 09/05/2016              Requested by: Jearld Fenton, NP [place], [place] [address] PCP: Webb Silversmith, NP   Assessment & Plan: Visit Diagnoses:  1. Chronic pain of right knee   2. Unilateral primary osteoarthritis, right knee     Plan: He tolerated the steroid injection well and her right knee. She'll follow up as needed but if this flares up again for her she'll come back we can always try another steroid injection and then definitely hyaluronic acid. At her next visit I would like an AP and lateral standing of her right knee.  Follow-Up Instructions: Return if symptoms worsen or fail to improve.   Orders:  No orders of the defined types were placed in this encounter.  No orders of the defined types were placed in this encounter.     Procedures: Large Joint Inj Date/Time: 09/05/2016 3:42 PM Performed by: Mcarthur Rossetti Authorized by: Mcarthur Rossetti   Location:  Knee Site:  R knee Ultrasound Guidance: No   Fluoroscopic Guidance: No   Arthrogram: No   Medications:  3 mL lidocaine 1 %; 40 mg methylPREDNISolone acetate 40 MG/ML     Clinical Data: No additional findings.   Subjective: Chief Complaint  Patient presents with  . Right Knee - Pain    Patient here today for maybe another injection in the knee. Her last injection was 08/17/15.     HPI Last time we injected her knee was just over 1 year ago. Its always been right knee pain and is mainly been in the medial joint line with known medial compartment osteoarthritis. Review of Systems Denies any chest pain shortness of breath or headache. She denies any right knee locking catching.  Objective: Vital Signs: LMP 05/11/2011   Physical Exam  Ortho Exam Her right knee is slightly warm. She has medial joint line tenderness. There is a mild effusion.  Range of motion is normal. Her Lockman's and McMurray's exam are negative. Specialty Comments:  No specialty comments available.  Imaging: No results found.   PMFS History: Patient Active Problem List   Diagnosis Date Noted  . HLD (hyperlipidemia) 11/16/2015  . GERD (gastroesophageal reflux disease) 08/14/2011  . PE (pulmonary embolism) 08/14/2011   Past Medical History:  Diagnosis Date  . GERD (gastroesophageal reflux disease)   . HLD (hyperlipidemia)   . Pulmonary embolism ([facility])    5 yrs ago (unknown region)    Family History  Problem Relation Age of Onset  . Diabetes Mother   . Heart failure Father   . Diabetes Brother   . Heart disease Sister   . Heart disease Maternal Grandmother   . Cancer Maternal Grandfather     ? pancreatic or lung  . Anesthesia problems Neg Hx   . Hypotension Neg Hx   . Malignant hyperthermia Neg Hx   . Pseudochol deficiency Neg Hx   . Stroke Neg Hx     Past Surgical History:  Procedure Laterality Date  . [place]   mc  . CHOLECYSTECTOMY  15 yrs ago   mc  . COLONOSCOPY  05/24/2012   Procedure: COLONOSCOPY;  Surgeon: Rogene Houston, MD;  [facility];  Service:  Endoscopy;  Laterality: N/A;  220  . ERCP  08/12/2011   Procedure: ENDOSCOPIC RETROGRADE CHOLANGIOPANCREATOGRAPHY (ERCP);  Surgeon: Rogene Houston, MD;  [facility];  Service: Endoscopy;  Laterality: N/A;  . ERCP W/ SPHICTEROTOMY  08/12/11   Dr Rehman-?microlithiasis, SOD  . ESOPHAGOGASTRODUODENOSCOPY  11/25/2011   Procedure: ESOPHAGOGASTRODUODENOSCOPY (EGD);  Surgeon: Rogene Houston, MD;  [facility];  Service: Endoscopy;  Laterality: N/A;  830  . EUS  01/19/2012   Procedure: UPPER ENDOSCOPIC ULTRASOUND (EUS) LINEAR;  Surgeon: Milus Banister, MD;  [facility];  Service: Endoscopy;  Laterality: N/A;  pt moved up a hour early by AW , Patty @ [place] office ok'd / pt was called by AW  . SPHINCTEROTOMY  08/12/2011   Procedure:  SPHINCTEROTOMY;  Surgeon: Rogene Houston, MD;  Location: [facility];  Service: Endoscopy;;  with ballon passage   Social History   Occupational History  . Designer, fashion/clothing   Social History Main Topics  . Smoking status: Never Smoker  . Smokeless tobacco: Never Used  . Alcohol use 0.0 oz/week     Comment: occasional wine  . Drug use: No  . Sexual activity: Yes    Birth control/ protection: Post-menopausal

## 2016-09-09 ENCOUNTER — Telehealth: Payer: Self-pay | Admitting: *Deleted

## 2016-09-09 NOTE — Telephone Encounter (Signed)
PT wrote in [place] asking about getting a Hep C Screening and Flu Vaccine. Please advise.

## 2016-09-09 NOTE — Telephone Encounter (Signed)
Ok for flu vaccine. Way past due for physical, she needs to schedule and we can talk about Hep C screening at that time

## 2016-09-14 NOTE — Telephone Encounter (Signed)
mychart msg sent to pt 

## 2016-09-22 ENCOUNTER — Telehealth (INDEPENDENT_AMBULATORY_CARE_PROVIDER_SITE_OTHER): Payer: Self-pay | Admitting: Orthopaedic Surgery

## 2016-09-22 NOTE — Telephone Encounter (Signed)
Patient would like to get the auth started for the gel injection.

## 2016-09-23 NOTE — Telephone Encounter (Signed)
order sent to [facility]

## 2016-09-28 ENCOUNTER — Telehealth (INDEPENDENT_AMBULATORY_CARE_PROVIDER_SITE_OTHER): Payer: Self-pay | Admitting: Orthopaedic Surgery

## 2016-09-28 NOTE — Telephone Encounter (Signed)
I called to advise patient I would check on this approval for her. She should not have problems with monovisc for feather allergy advised this shot does not have rooster comb. Emailed Autumn Patty to check on status, and advised her I would call her back.

## 2016-09-28 NOTE — Telephone Encounter (Signed)
Monovisc was approved, I called left patient vm to have her come in for injection. Asked she please call back and we could schedule.

## 2016-09-28 NOTE — Telephone Encounter (Signed)
Patient called this morning to ask about the injection she is receiving, she is allergic to feathers and wants to make sure she will not have a reaction.  At this point she is in severe pain and nothing is working.  Please call back: [phone]

## 2016-09-28 NOTE — Telephone Encounter (Signed)
I called made her appt for CB tomorrow at 130.

## 2016-09-29 ENCOUNTER — Ambulatory Visit (INDEPENDENT_AMBULATORY_CARE_PROVIDER_SITE_OTHER): Payer: Self-pay | Admitting: Orthopaedic Surgery

## 2016-09-29 DIAGNOSIS — M25561 Pain in right knee: Secondary | ICD-10-CM

## 2016-09-29 DIAGNOSIS — G8929 Other chronic pain: Secondary | ICD-10-CM

## 2016-09-29 NOTE — Progress Notes (Signed)
The patient is here for a scheduled hyaluronic acid injection in her right knee. She has some chronic pain in that knee as well as some mild to moderate arthritic changes. She has tried and failed other forms of conservative treatment.  On examination of her knee there is no effusion of the right knee with good range of motion but is painful only on the medial joint line but somewhat laterally as well.  Cleaned her knee with Betadine and alcohol and was able to place the hyaluronic acid injection in her knee without difficulty. At this point she will follow-up as needed. Letter noted that she can always have a steroid injection again her knee evaluated at least 2-3 months. If she has any other issues were happy to see her back at any time.

## 2016-11-11 ENCOUNTER — Ambulatory Visit (INDEPENDENT_AMBULATORY_CARE_PROVIDER_SITE_OTHER): Payer: Managed Care, Other (non HMO)

## 2016-11-11 DIAGNOSIS — Z23 Encounter for immunization: Secondary | ICD-10-CM | POA: Diagnosis not present

## 2016-11-18 ENCOUNTER — Encounter: Payer: Self-pay | Admitting: Primary Care

## 2016-11-18 ENCOUNTER — Ambulatory Visit (INDEPENDENT_AMBULATORY_CARE_PROVIDER_SITE_OTHER): Payer: Managed Care, Other (non HMO) | Admitting: Primary Care

## 2016-11-18 VITALS — BP 142/90 | HR 57 | Temp 98.2°F | Ht 63.0 in | Wt 167.8 lb

## 2016-11-18 DIAGNOSIS — M5442 Lumbago with sciatica, left side: Secondary | ICD-10-CM | POA: Diagnosis not present

## 2016-11-18 MED ORDER — METHOCARBAMOL 500 MG PO TABS
500.0000 mg | ORAL_TABLET | Freq: Three times a day (TID) | ORAL | 0 refills | Status: DC | PRN
Start: 2016-11-18 — End: 2016-12-06

## 2016-11-18 NOTE — Progress Notes (Signed)
Pre visit review using our clinic review tool, if applicable. No additional management support is needed unless otherwise documented below in the visit note. 

## 2016-11-18 NOTE — Patient Instructions (Signed)
Continue Ibuprofen. Take 600 mg three times daily as needed for pain.  Start methocarbamol muscle relaxer. Take 1 tablet by mouth every 8 hours as needed for back pain/muscle spasm. Caution as this medication may cause drowsiness.  Continue use of the heating pad. Refrain from laying still too long as this may cause increase in symptoms.  Please notify us if no improvement in 1 week.  It was a pleasure meeting you!

## 2016-11-18 NOTE — Progress Notes (Signed)
Subjective:    Patient ID: Mallory [name], female    DOB: [birth year], 61 y.o.   MRN: [id number]  HPI  Mallory [name] is a 61 year old female who presents today with a chief complaint of back pain. Her back pain is located to the lower mid/left back which has been present for the past 2-3 days. The pain will radiate down to her left lower posterior knee when she stands. She's been taking Advil and resting on a heating pad with temporary improvement. Her pain is worse after getting up after sitting for prolonged periods of time, and also with prolonged sitting. She denies injury/trauma, weakness, numbness/tingling.   Review of Systems  Musculoskeletal: Positive for back pain.  Skin: Negative for color change.  Neurological: Negative for weakness and numbness.       Past Medical History:  Diagnosis Date  . GERD (gastroesophageal reflux disease)   . HLD (hyperlipidemia)   . Pulmonary embolism ([place])    5 yrs ago (unknown region)     Social History   Social History  . Marital status: Single    Spouse name: N/A  . Number of children: 2  . Years of education: N/A   Occupational History  . Designer, fashion/clothing   Social History Main Topics  . Smoking status: Never Smoker  . Smokeless tobacco: Never Used  . Alcohol use 0.0 oz/week     Comment: occasional wine  . Drug use: No  . Sexual activity: Yes    Birth control/ protection: Post-menopausal   Other Topics Concern  . Not on file   Social History Narrative   Lives w/ 28 yr old daughter.  2 daughters          Past Surgical History:  Procedure Laterality Date  . [place]   [facility]  . CHOLECYSTECTOMY  15 yrs ago   [facility]  . COLONOSCOPY  05/24/2012   Procedure: COLONOSCOPY;  Surgeon: Rogene Houston, MD;  Location: [facility];  Service: Endoscopy;  Laterality: N/A;  220  . ERCP  08/12/2011   Procedure: ENDOSCOPIC RETROGRADE CHOLANGIOPANCREATOGRAPHY (ERCP);  Surgeon: Rogene Houston, MD;  Location: [facility];   Service: Endoscopy;  Laterality: N/A;  . ERCP W/ SPHICTEROTOMY  08/12/11   Dr Rehman-?microlithiasis, SOD  . ESOPHAGOGASTRODUODENOSCOPY  11/25/2011   Procedure: ESOPHAGOGASTRODUODENOSCOPY (EGD);  Surgeon: Rogene Houston, MD;  Location: [facility];  Service: Endoscopy;  Laterality: N/A;  830  . EUS  01/19/2012   Procedure: UPPER ENDOSCOPIC ULTRASOUND (EUS) LINEAR;  Surgeon: Milus Banister, MD;  Location: [facility];  Service: Endoscopy;  Laterality: N/A;  pt moved up a hour early by AW , Patty @ [place] office ok'd / pt was called by AW  . SPHINCTEROTOMY  08/12/2011   Procedure: SPHINCTEROTOMY;  Surgeon: Rogene Houston, MD;  Location: [facility];  Service: Endoscopy;;  with ballon passage    Family History  Problem Relation Age of Onset  . Diabetes Mother   . Heart failure Father   . Diabetes Brother   . Heart disease Sister   . Heart disease Maternal Grandmother   . Cancer Maternal Grandfather     ? pancreatic or lung  . Anesthesia problems Neg Hx   . Hypotension Neg Hx   . Malignant hyperthermia Neg Hx   . Pseudochol deficiency Neg Hx   . Stroke Neg Hx     Allergies  Allergen Reactions  . Promethazine Hcl Hives  . Hydromorphone Itching  .  Nitrofurantoin Monohyd Macro Itching    Current Outpatient Prescriptions on File Prior to Visit  Medication Sig Dispense Refill  . hyoscyamine (LEVSIN SL) 0.125 MG SL tablet TAKE ONE TABLET DAILY EVERY 4 HOURS AS NEEDED FOR CRAMPING. 90 tablet 0  . ibuprofen (ADVIL,MOTRIN) 200 MG tablet Take 400 mg by mouth daily as needed for mild pain or moderate pain. Pain    . pantoprazole (PROTONIX) 20 MG tablet TAKE (1) TABLET BY MOUTH TWICE DAILY. 60 tablet 11  . pravastatin (PRAVACHOL) 10 MG tablet Take 1 tablet (10 mg total) by mouth daily. 90 tablet 1   No current facility-administered medications on file prior to visit.     BP (!) 142/90   Pulse (!) 57   Temp 98.2 F (36.8 C) (Oral)   Ht 5\' 3"  (1.6 m)   Wt 167 lb 12.8 oz (76.1 kg)   LMP  05/11/2011   SpO2 99%   BMI 29.72 kg/m    Objective:   Physical Exam  Constitutional: She appears well-nourished.  Neck: Neck supple.  Cardiovascular: Normal rate and regular rhythm.   Pulmonary/Chest: Effort normal and breath sounds normal.  Musculoskeletal:       Lumbar back: She exhibits decreased range of motion, tenderness, pain and spasm. She exhibits no bony tenderness.  Skin: Skin is warm and dry.          Assessment & Plan:  Acute Low Back Pain:  Located to left/mid lower back x 2-3 days. No injury/trauma, numbness/tingling. Exam today with decreased ROM, tenderness upon palpation.  Suspect either muscle spasm or arthritic flare. Rx for Robaxin sent to pharmacy. Discussed to continue Ibuprofen and heating pad. Discussed to refrain from sitting too long to avoid stiffness. Follow up PRN.  Sheral Flow, NP

## 2016-12-06 ENCOUNTER — Encounter: Payer: Self-pay | Admitting: Internal Medicine

## 2016-12-06 ENCOUNTER — Ambulatory Visit (INDEPENDENT_AMBULATORY_CARE_PROVIDER_SITE_OTHER): Payer: Managed Care, Other (non HMO) | Admitting: Internal Medicine

## 2016-12-06 VITALS — BP 114/70 | HR 54 | Temp 97.5°F | Ht 64.0 in | Wt 166.0 lb

## 2016-12-06 DIAGNOSIS — G8929 Other chronic pain: Secondary | ICD-10-CM | POA: Diagnosis not present

## 2016-12-06 DIAGNOSIS — K219 Gastro-esophageal reflux disease without esophagitis: Secondary | ICD-10-CM | POA: Diagnosis not present

## 2016-12-06 DIAGNOSIS — Z1159 Encounter for screening for other viral diseases: Secondary | ICD-10-CM

## 2016-12-06 DIAGNOSIS — Z86711 Personal history of pulmonary embolism: Secondary | ICD-10-CM

## 2016-12-06 DIAGNOSIS — K589 Irritable bowel syndrome without diarrhea: Secondary | ICD-10-CM | POA: Diagnosis not present

## 2016-12-06 DIAGNOSIS — E7800 Pure hypercholesterolemia, unspecified: Secondary | ICD-10-CM | POA: Diagnosis not present

## 2016-12-06 DIAGNOSIS — M545 Low back pain, unspecified: Secondary | ICD-10-CM

## 2016-12-06 DIAGNOSIS — M25561 Pain in right knee: Secondary | ICD-10-CM

## 2016-12-06 DIAGNOSIS — Z114 Encounter for screening for human immunodeficiency virus [HIV]: Secondary | ICD-10-CM

## 2016-12-06 DIAGNOSIS — Z0001 Encounter for general adult medical examination with abnormal findings: Secondary | ICD-10-CM | POA: Diagnosis not present

## 2016-12-06 LAB — CBC
HCT: 46.7 % — ABNORMAL HIGH (ref 36.0–46.0)
Hemoglobin: 15.8 g/dL — ABNORMAL HIGH (ref 12.0–15.0)
MCHC: 33.9 g/dL (ref 30.0–36.0)
MCV: 84.3 fl (ref 78.0–100.0)
Platelets: 235 K/uL (ref 150.0–400.0)
RBC: 5.54 Mil/uL — ABNORMAL HIGH (ref 3.87–5.11)
RDW: 14 % (ref 11.5–15.5)
WBC: 8.1 K/uL (ref 4.0–10.5)

## 2016-12-06 LAB — LIPID PANEL
CHOLESTEROL: 190 mg/dL (ref 0–200)
HDL: 51 mg/dL (ref >39.00)
LDL CALC: 122 mg/dL — AB (ref 0–99)
NonHDL: 138.61
Total CHOL/HDL Ratio: 4
Triglycerides: 85 mg/dL (ref 0.0–149.0)
VLDL: 17 mg/dL (ref 0.0–40.0)

## 2016-12-06 LAB — COMPREHENSIVE METABOLIC PANEL
ALBUMIN: 4.2 g/dL (ref 3.5–5.2)
ALK PHOS: 93 U/L (ref 39–117)
ALT: 29 U/L (ref 0–35)
AST: 20 U/L (ref 0–37)
BILIRUBIN TOTAL: 0.5 mg/dL (ref 0.2–1.2)
BUN: 17 mg/dL (ref 6–23)
CALCIUM: 9.8 mg/dL (ref 8.4–10.5)
CO2: 30 mEq/L (ref 19–32)
CREATININE: 0.79 mg/dL (ref 0.40–1.20)
Chloride: 104 mEq/L (ref 96–112)
GFR: 79.33 mL/min (ref >60.00)
Glucose, Bld: 99 mg/dL (ref 70–99)
Potassium: 4.3 mEq/L (ref 3.5–5.1)
Sodium: 142 mEq/L (ref 135–145)
TOTAL PROTEIN: 7.1 g/dL (ref 6.0–8.3)

## 2016-12-06 MED ORDER — METHOCARBAMOL 500 MG PO TABS: 1000.0000 mg | ORAL_TABLET | Freq: Three times a day (TID) | ORAL | 0 refills | Status: DC | PRN

## 2016-12-06 MED ORDER — PREDNISONE 10 MG PO TABS: ORAL_TABLET | ORAL | 0 refills | Status: DC

## 2016-12-06 NOTE — Assessment & Plan Note (Signed)
Currently not an issue Will monitor 

## 2016-12-06 NOTE — Assessment & Plan Note (Signed)
CMET and Lipid profile today Encouraged her to consume a low fat diet Continue Pravastatin for now, will adjust if needed

## 2016-12-06 NOTE — Assessment & Plan Note (Signed)
Continue Protonix BID.  

## 2016-12-06 NOTE — Assessment & Plan Note (Signed)
She will continue to follow with Dr. Rush Farmer She reports the next step is knee replacement

## 2016-12-06 NOTE — Patient Instructions (Signed)
Health Maintenance for Postmenopausal Women Menopause is a normal process in which your reproductive ability comes to an end. This process happens gradually over a span of months to years, usually between the ages of 63 and 5. Menopause is complete when you have missed 12 consecutive menstrual periods. It is important to talk with your health care provider about some of the most common conditions that affect postmenopausal women, such as heart disease, cancer, and bone loss (osteoporosis). Adopting a healthy lifestyle and getting preventive care can help to promote your health and wellness. Those actions can also lower your chances of developing some of these common conditions. What should I know about menopause? During menopause, you may experience a number of symptoms, such as:  Moderate-to-severe hot flashes.  Night sweats.  Decrease in sex drive.  Mood swings.  Headaches.  Tiredness.  Irritability.  Memory problems.  Insomnia. Choosing to treat or not to treat menopausal changes is an individual decision that you make with your health care provider. What should I know about hormone replacement therapy and supplements? Hormone therapy products are effective for treating symptoms that are associated with menopause, such as hot flashes and night sweats. Hormone replacement carries certain risks, especially as you become older. If you are thinking about using estrogen or estrogen with progestin treatments, discuss the benefits and risks with your health care provider. What should I know about heart disease and stroke? Heart disease, heart attack, and stroke become more likely as you age. This may be due, in part, to the hormonal changes that your body experiences during menopause. These can affect how your body processes dietary fats, triglycerides, and cholesterol. Heart attack and stroke are both medical emergencies. There are many things that you can do to help prevent heart disease  and stroke:  Have your blood pressure checked at least every 1-2 years. High blood pressure causes heart disease and increases the risk of stroke.  If you are 40-71 years old, ask your health care provider if you should take aspirin to prevent a heart attack or a stroke.  Do not use any tobacco products, including cigarettes, chewing tobacco, or electronic cigarettes. If you need help quitting, ask your health care provider.  It is important to eat a healthy diet and maintain a healthy weight.  Be sure to include plenty of vegetables, fruits, low-fat dairy products, and lean protein.  Avoid eating foods that are high in solid fats, added sugars, or salt (sodium).  Get regular exercise. This is one of the most important things that you can do for your health.  Try to exercise for at least 150 minutes each week. The type of exercise that you do should increase your heart rate and make you sweat. This is known as moderate-intensity exercise.  Try to do strengthening exercises at least twice each week. Do these in addition to the moderate-intensity exercise.  Know your numbers.Ask your health care provider to check your cholesterol and your blood glucose. Continue to have your blood tested as directed by your health care provider. What should I know about cancer screening? There are several types of cancer. Take the following steps to reduce your risk and to catch any cancer development as early as possible. Breast Cancer  Practice breast self-awareness.  This means understanding how your breasts normally appear and feel.  It also means doing regular breast self-exams. Let your health care provider know about any changes, no matter how small.  If you are 40 or older,  have a clinician do a breast exam (clinical breast exam or CBE) every year. Depending on your age, family history, and medical history, it may be recommended that you also have a yearly breast X-ray (mammogram).  If you  have a family history of breast cancer, talk with your health care provider about genetic screening.  If you are at high risk for breast cancer, talk with your health care provider about having an MRI and a mammogram every year.  Breast cancer (BRCA) gene test is recommended for women who have family members with BRCA-related cancers. Results of the assessment will determine the need for genetic counseling and BRCA1 and for BRCA2 testing. BRCA-related cancers include these types:  Breast. This occurs in males or females.  Ovarian.  Tubal. This may also be called fallopian tube cancer.  Cancer of the abdominal or pelvic lining (peritoneal cancer).  Prostate.  Pancreatic. Cervical, Uterine, and Ovarian Cancer  Your health care provider may recommend that you be screened regularly for cancer of the pelvic organs. These include your ovaries, uterus, and vagina. This screening involves a pelvic exam, which includes checking for microscopic changes to the surface of your cervix (Pap test).  For women ages 21-65, health care providers may recommend a pelvic exam and a Pap test every three years. For women ages 23-65, they may recommend the Pap test and pelvic exam, combined with testing for human papilloma virus (HPV), every five years. Some types of HPV increase your risk of cervical cancer. Testing for HPV may also be done on women of any age who have unclear Pap test results.  Other health care providers may not recommend any screening for nonpregnant women who are considered low risk for pelvic cancer and have no symptoms. Ask your health care provider if a screening pelvic exam is right for you.  If you have had past treatment for cervical cancer or a condition that could lead to cancer, you need Pap tests and screening for cancer for at least 20 years after your treatment. If Pap tests have been discontinued for you, your risk factors (such as having a new sexual partner) need to be reassessed  to determine if you should start having screenings again. Some women have medical problems that increase the chance of getting cervical cancer. In these cases, your health care provider may recommend that you have screening and Pap tests more often.  If you have a family history of uterine cancer or ovarian cancer, talk with your health care provider about genetic screening.  If you have vaginal bleeding after reaching menopause, tell your health care provider.  There are currently no reliable tests available to screen for ovarian cancer. Lung Cancer  Lung cancer screening is recommended for adults 99-83 years old who are at high risk for lung cancer because of a history of smoking. A yearly low-dose CT scan of the lungs is recommended if you:  Currently smoke.  Have a history of at least 30 pack-years of smoking and you currently smoke or have quit within the past 15 years. A pack-year is smoking an average of one pack of cigarettes per day for one year. Yearly screening should:  Continue until it has been 15 years since you quit.  Stop if you develop a health problem that would prevent you from having lung cancer treatment. Colorectal Cancer  This type of cancer can be detected and can often be prevented.  Routine colorectal cancer screening usually begins at age 72 and continues  through age 31.  If you have risk factors for colon cancer, your health care provider may recommend that you be screened at an earlier age.  If you have a family history of colorectal cancer, talk with your health care provider about genetic screening.  Your health care provider may also recommend using home test kits to check for hidden blood in your stool.  A small camera at the end of a tube can be used to examine your colon directly (sigmoidoscopy or colonoscopy). This is done to check for the earliest forms of colorectal cancer.  Direct examination of the colon should be repeated every 5-10 years until  age 79. However, if early forms of precancerous polyps or small growths are found or if you have a family history or genetic risk for colorectal cancer, you may need to be screened more often. Skin Cancer  Check your skin from head to toe regularly.  Monitor any moles. Be sure to tell your health care provider:  About any new moles or changes in moles, especially if there is a change in a mole's shape or color.  If you have a mole that is larger than the size of a pencil eraser.  If any of your family members has a history of skin cancer, especially at a young age, talk with your health care provider about genetic screening.  Always use sunscreen. Apply sunscreen liberally and repeatedly throughout the day.  Whenever you are outside, protect yourself by wearing long sleeves, pants, a wide-brimmed hat, and sunglasses. What should I know about osteoporosis? Osteoporosis is a condition in which bone destruction happens more quickly than new bone creation. After menopause, you may be at an increased risk for osteoporosis. To help prevent osteoporosis or the bone fractures that can happen because of osteoporosis, the following is recommended:  If you are 38-13 years old, get at least 1,000 mg of calcium and at least 600 mg of vitamin D per day.  If you are older than age 38 but younger than age 36, get at least 1,200 mg of calcium and at least 600 mg of vitamin D per day.  If you are older than age 48, get at least 1,200 mg of calcium and at least 800 mg of vitamin D per day. Smoking and excessive alcohol intake increase the risk of osteoporosis. Eat foods that are rich in calcium and vitamin D, and do weight-bearing exercises several times each week as directed by your health care provider. What should I know about how menopause affects my mental health? Depression may occur at any age, but it is more common as you become older. Common symptoms of depression include:  Low or sad  mood.  Changes in sleep patterns.  Changes in appetite or eating patterns.  Feeling an overall lack of motivation or enjoyment of activities that you previously enjoyed.  Frequent crying spells. Talk with your health care provider if you think that you are experiencing depression. What should I know about immunizations? It is important that you get and maintain your immunizations. These include:  Tetanus, diphtheria, and pertussis (Tdap) booster vaccine.  Influenza every year before the flu season begins.  Pneumonia vaccine.  Shingles vaccine. Your health care provider may also recommend other immunizations. This information is not intended to replace advice given to you by your health care provider. Make sure you discuss any questions you have with your health care provider. Document Released: 11/18/2005 Document Revised: 04/15/2016 Document Reviewed: 06/30/2015 Elsevier Interactive Patient  Education  2017 [employer].

## 2016-12-06 NOTE — Progress Notes (Signed)
Subjective:    Patient ID: Mallory [name], female    DOB: [birth year], 61 y.o.   MRN: [id number]  HPI  Pt presents to the clinic today for her annual exam. She is also due to follow up chronic conditions.  History of PE: Occurred in 2012. She was on Coumadin x 1 year. Negative workup. Has not had any complications since that time.  GERD: Triggered by spicy and greasy foods. Symptoms controlled with weight loss and Protonix 20 mg BID. She follows with Dr. Ardis Hughs, note from 08/2016 reviewed. EGD from 11/2011 reviewed.  IBS: She usually has abdominal cramping after each meal. When it gets really bad, she takes Levsin as needed with good relief. She follows with Dr. Ardis Hughs, note from 08/2016 reviewed.  HLD: Her last LDL was 121, 11/2015. She is taking Pravastatin as prescribed. She denies myalgias. She tries to consume a low fat diet.   Chronic Right Knee Pain: She recently received a hyaluronic acid injection, with some relief. She is following with Dr. Ninfa Linden, note from 09/2016.  She also c/o right side back pain. This started 3 days ago. She describes the pain sore and achy. The pain does not radiate down her leg. She denies numbness and tingling in her leg. The pain seems worse with sitting and laying down. The pain seems better with standing. She denies any injury to the area. She has tried Robaxin and Advil with minimal relief.   Flu: 11/2016 Tetanus: 12/2014 Pap Smear: 2015, scheduled 01/2017 with Dr. Braulio Bosch Mammogram: 07/2016 at [place]: 04/2012 Vision Screening: annually Dentist: as needed  Diet: She does eat lean meat. She consumes fruits and veggies daily. She tries to avoid fried foods. She drinks mostly Coke Zero or half sweet half unsweet tea. Exercise: None recently  Review of Systems      Past Medical History:  Diagnosis Date  . GERD (gastroesophageal reflux disease)   . HLD (hyperlipidemia)   . Pulmonary embolism ([place])    5 yrs ago (unknown region)     Current Outpatient Prescriptions  Medication Sig Dispense Refill  . hyoscyamine (LEVSIN SL) 0.125 MG SL tablet TAKE ONE TABLET DAILY EVERY 4 HOURS AS NEEDED FOR CRAMPING. 90 tablet 0  . ibuprofen (ADVIL,MOTRIN) 200 MG tablet Take 400 mg by mouth daily as needed for mild pain or moderate pain. Pain    . methocarbamol (ROBAXIN) 500 MG tablet Take 1 tablet (500 mg total) by mouth every 8 (eight) hours as needed for muscle spasms. 30 tablet 0  . pantoprazole (PROTONIX) 20 MG tablet TAKE (1) TABLET BY MOUTH TWICE DAILY. 60 tablet 11  . pravastatin (PRAVACHOL) 10 MG tablet Take 1 tablet (10 mg total) by mouth daily. 90 tablet 1   No current facility-administered medications for this visit.     Allergies  Allergen Reactions  . Promethazine Hcl Hives  . Hydromorphone Itching  . Nitrofurantoin Monohyd Macro Itching    Family History  Problem Relation Age of Onset  . Diabetes Mother   . Heart failure Father   . Diabetes Brother   . Heart disease Sister   . Heart disease Maternal Grandmother   . Cancer Maternal Grandfather     ? pancreatic or lung  . Anesthesia problems Neg Hx   . Hypotension Neg Hx   . Malignant hyperthermia Neg Hx   . Pseudochol deficiency Neg Hx   . Stroke Neg Hx     Social History   Social History  . Marital status:  Single    Spouse name: N/A  . Number of children: 2  . Years of education: N/A   Occupational History  . Designer, fashion/clothing   Social History Main Topics  . Smoking status: Never Smoker  . Smokeless tobacco: Never Used  . Alcohol use 0.0 oz/week     Comment: occasional wine  . Drug use: No  . Sexual activity: Yes    Birth control/ protection: Post-menopausal   Other Topics Concern  . Not on file   Social History Narrative   Lives w/ 68 yr old daughter.  2 daughters           Constitutional: Denies fever, malaise, fatigue, headache or abrupt weight changes.  HEENT: Denies eye pain, eye redness, ear pain, ringing in  the ears, wax buildup, runny nose, nasal congestion, bloody nose, or sore throat. Respiratory: Denies difficulty breathing, shortness of breath, cough or sputum production.   Cardiovascular: Denies chest pain, chest tightness, palpitations or swelling in the hands or feet.  Gastrointestinal: Pt reports abdominal cramping. Denies abdominal pain, bloating, constipation, diarrhea or blood in the stool.  GU: Denies urgency, frequency, pain with urination, burning sensation, blood in urine, odor or discharge. Musculoskeletal: Pt reports right side low back pain and chronic right knee pain. Denies difficulty with gait, or joint swelling.  Skin: Denies redness, rashes, lesions or ulcercations.  Neurological: Denies dizziness, difficulty with memory, difficulty with speech or problems with balance and coordination.  Psych: Denies anxiety, depression, SI/HI.  No other specific complaints in a complete review of systems (except as listed in HPI above).  Objective:   Physical Exam    BP 114/70   Pulse (!) 54   Temp 97.5 F (36.4 C) (Oral)   Ht 5\' 4"  (1.626 m)   Wt 166 lb (75.3 kg)   LMP 05/11/2011   SpO2 98%   BMI 28.49 kg/m  Wt Readings from Last 3 Encounters:  12/06/16 166 lb (75.3 kg)  11/18/16 167 lb 12.8 oz (76.1 kg)  08/24/16 162 lb 8 oz (73.7 kg)    General: Appears her stated age, well developed, well nourished in NAD. Skin: Warm, dry and intact.  HEENT: Head: normal shape and size; Eyes: sclera white, no icterus, conjunctiva pink, PERRLA and EOMs intact; Ears: Tm's gray and intact, normal light reflex; Throat/Mouth: Teeth present, mucosa pink and moist, no exudate, lesions or ulcerations noted.  Neck:  Neck supple, trachea midline. No masses, lumps or thyromegaly present.  Cardiovascular: Normal rate and rhythm. S1,S2 noted.  No murmur, rubs or gallops noted. No JVD or BLE edema. No carotid bruits noted. Pulmonary/Chest: Normal effort and positive vesicular breath sounds. No  respiratory distress. No wheezes, rales or ronchi noted.  Abdomen: Soft and nontender. Normal bowel sounds. No distention or masses noted. Liver, spleen and kidneys non palpable. Musculoskeletal: Pain with flexion and extension of the spine. Normal rotation and lateral bending. She has pain over the lumbar spine. Pain with palpation of the right paralumbar muscles. Strength 5/5 BUE/BLE. No difficulty with gait.  Neurological: Alert and oriented. Cranial nerves II-XII grossly intact. Coordination normal.  Psychiatric: Mood and affect normal. Behavior is normal. Judgment and thought content normal.    BMET    Component Value Date/Time   NA 140 03/25/2016 1036   K 4.1 03/25/2016 1036   CL 104 03/25/2016 1036   CO2 32 03/25/2016 1036   GLUCOSE 95 03/25/2016 1036   BUN 16 03/25/2016 1036   CREATININE 0.83 03/25/2016 1036  CREATININE 0.85 11/03/2011 1205   CALCIUM 9.8 03/25/2016 1036   GFRNONAA >60 03/30/2015 1536   GFRAA >60 03/30/2015 1536    Lipid Panel     Component Value Date/Time   CHOL 185 11/16/2015 1513   TRIG 75.0 11/16/2015 1513   HDL 48.60 11/16/2015 1513   CHOLHDL 4 11/16/2015 1513   VLDL 15.0 11/16/2015 1513   LDLCALC 121 (H) 11/16/2015 1513    CBC    Component Value Date/Time   WBC 8.1 03/25/2016 1036   RBC 5.61 (H) 03/25/2016 1036   HGB 15.8 (H) 03/25/2016 1036   HCT 46.8 (H) 03/25/2016 1036   PLT 247.0 03/25/2016 1036   MCV 83.4 03/25/2016 1036   MCH 28.4 03/30/2015 1536   MCHC 33.7 03/25/2016 1036   RDW 14.2 03/25/2016 1036   LYMPHSABS 2.6 03/25/2016 1036   MONOABS 0.7 03/25/2016 1036   EOSABS 0.2 03/25/2016 1036   BASOSABS 0.0 03/25/2016 1036    Hgb A1C Lab Results  Component Value Date   HGBA1C 6.3 12/23/2014          Assessment & Plan:   Preventative Health Maintenance:  Flu and tetanus UTD Pap smear, mammogram and colon screening UTD Encouraged her to consume a balanced diet and exercise regimen Advised her to see an eye doctor and  dentist annually Will check CBC, CMET, Lipid, HIV and Hep C today  Right side low back pain:  Muscular Stretching exercises given Try a heating pad eRx for Pred Taper x 6 days Increase Robaxin to 1000 mg TID prn  RTC in 1 year, sooner if needed

## 2016-12-06 NOTE — Assessment & Plan Note (Signed)
Controlled with Levsin She will continue to follow with Dr. Ardis Hughs

## 2016-12-07 LAB — HIV ANTIBODY (ROUTINE TESTING W REFLEX): HIV 1&2 Ab, 4th Generation: NONREACTIVE

## 2016-12-07 LAB — HEPATITIS C ANTIBODY: HCV Ab: NEGATIVE

## 2017-01-31 ENCOUNTER — Ambulatory Visit (INDEPENDENT_AMBULATORY_CARE_PROVIDER_SITE_OTHER): Payer: Managed Care, Other (non HMO) | Admitting: Physician Assistant

## 2017-01-31 DIAGNOSIS — M1711 Unilateral primary osteoarthritis, right knee: Secondary | ICD-10-CM

## 2017-01-31 MED ORDER — METHYLPREDNISOLONE ACETATE 40 MG/ML IJ SUSP
40.0000 mg | INTRAMUSCULAR | Status: AC | PRN
  Administered 2017-01-31: 40 mg via INTRA_ARTICULAR

## 2017-01-31 MED ORDER — LIDOCAINE HCL 1 % IJ SOLN
3.0000 mL | INTRAMUSCULAR | Status: AC | PRN
  Administered 2017-01-31: 3 mL

## 2017-01-31 NOTE — Progress Notes (Signed)
Office Visit Note   Patient: Mallory [name]           Date of Birth: Apr 22, 1956           MRN: [id number] Visit Date: 01/31/2017              Requested by: Jearld Fenton, NP [place], [place] [id number] PCP: Webb Silversmith, NP   Assessment & Plan: Visit Diagnoses: No diagnosis found.  Plan: We will see her back in some time in June for monitoring this injection of right knee.  Follow-Up Instructions: Return for Supplemental injection.   Orders:  No orders of the defined types were placed in this encounter.  No orders of the defined types were placed in this encounter.     Procedures: Large Joint Inj Date/Time: 01/31/2017 11:20 AM Performed by: Pete Pelt Authorized by: Pete Pelt   Consent Given by:  Patient Indications:  Pain Location:  Knee Site:  R knee Needle Size:  22 G Needle Length:  3.5 inches Approach:  Anterolateral Ultrasound Guidance: No   Fluoroscopic Guidance: No   Medications:  40 mg methylPREDNISolone acetate 40 MG/ML; 3 mL lidocaine 1 % Aspiration Attempted: No   Patient tolerance:  Patient tolerated the procedure well with no immediate complications     Clinical Data: No additional findings.   Subjective: No chief complaint on file.   HPI [name] returns today for her right knee pain. She has known mild to moderate arthritis of the right knee. Last received a mild disc injection on 09/29/2016 states that it helps for a while however pain has now returned and she is having some difficulty getting around. She's had no new injury. Review of Systems   Objective: Vital Signs: LMP 05/11/2011   Physical Exam  Constitutional: She is oriented to person, place, and time. She appears well-developed and well-nourished. No distress.  Neurological: She is alert and oriented to person, place, and time.  Psychiatric: She has a normal mood and affect.    Ortho Exam Right knee full extension and full flexion. No  instability valgus varus stressing. Tenderness of the medial joint line only. No effusion abnormal warmth erythema or edema of the right knee. Specialty Comments:  No specialty comments available.  Imaging: No results found.   PMFS History: Patient Active Problem List   Diagnosis Date Noted  . IBS (irritable bowel syndrome) 12/06/2016  . Chronic pain of right knee 12/06/2016  . HLD (hyperlipidemia) 11/16/2015  . GERD (gastroesophageal reflux disease) 08/14/2011  . History of pulmonary embolus (PE) 08/14/2011   Past Medical History:  Diagnosis Date  . GERD (gastroesophageal reflux disease)   . HLD (hyperlipidemia)   . Pulmonary embolism ([facility])    5 yrs ago (unknown region)    Family History  Problem Relation Age of Onset  . Diabetes Mother   . Heart failure Father   . Diabetes Brother   . Heart disease Sister   . Heart disease Maternal Grandmother   . Cancer Maternal Grandfather     ? pancreatic or lung  . Anesthesia problems Neg Hx   . Hypotension Neg Hx   . Malignant hyperthermia Neg Hx   . Pseudochol deficiency Neg Hx   . Stroke Neg Hx     Past Surgical History:  Procedure Laterality Date  . [place]   mc  . CHOLECYSTECTOMY  15 yrs ago   mc  . COLONOSCOPY  05/24/2012  Procedure: COLONOSCOPY;  Surgeon: Rogene Houston, MD;  [facility];  Service: Endoscopy;  Laterality: N/A;  220  . ERCP  08/12/2011   Procedure: ENDOSCOPIC RETROGRADE CHOLANGIOPANCREATOGRAPHY (ERCP);  Surgeon: Rogene Houston, MD;  Location: [facility];  Service: Endoscopy;  Laterality: N/A;  . ERCP W/ SPHICTEROTOMY  08/12/11   Dr Rehman-?microlithiasis, SOD  . ESOPHAGOGASTRODUODENOSCOPY  11/25/2011   Procedure: ESOPHAGOGASTRODUODENOSCOPY (EGD);  Surgeon: Rogene Houston, MD;  Location: [facility];  Service: Endoscopy;  Laterality: N/A;  830  . EUS  01/19/2012   Procedure: UPPER ENDOSCOPIC ULTRASOUND (EUS) LINEAR;  Surgeon: Milus Banister, MD;  Location: [facility];   Service: Endoscopy;  Laterality: N/A;  pt moved up a hour early by AW , Patty @ [facility] office ok'd / pt was called by AW  . SPHINCTEROTOMY  08/12/2011   Procedure: SPHINCTEROTOMY;  Surgeon: Rogene Houston, MD;  Location: [facility];  Service: Endoscopy;;  with ballon passage   Social History   Occupational History  . Designer, fashion/clothing   Social History Main Topics  . Smoking status: Never Smoker  . Smokeless tobacco: Never Used  . Alcohol use 0.0 oz/week     Comment: occasional wine  . Drug use: No  . Sexual activity: Yes    Birth control/ protection: Post-menopausal

## 2017-02-14 ENCOUNTER — Other Ambulatory Visit: Payer: Self-pay | Admitting: Internal Medicine

## 2017-02-16 ENCOUNTER — Ambulatory Visit (INDEPENDENT_AMBULATORY_CARE_PROVIDER_SITE_OTHER): Payer: Managed Care, Other (non HMO) | Admitting: Orthopaedic Surgery

## 2017-03-09 ENCOUNTER — Telehealth (INDEPENDENT_AMBULATORY_CARE_PROVIDER_SITE_OTHER): Payer: Self-pay

## 2017-03-09 NOTE — Telephone Encounter (Signed)
Can we make her appt with Ninfa Linden or Artis Delay for Monovisc injection

## 2017-03-09 NOTE — Telephone Encounter (Signed)
Patient scheduled for monovisc injection 03/27/17 @ 3:15 pm with Dr Ninfa Linden

## 2017-03-27 ENCOUNTER — Ambulatory Visit (INDEPENDENT_AMBULATORY_CARE_PROVIDER_SITE_OTHER): Payer: Managed Care, Other (non HMO) | Admitting: Physician Assistant

## 2017-03-27 DIAGNOSIS — M1711 Unilateral primary osteoarthritis, right knee: Secondary | ICD-10-CM | POA: Diagnosis not present

## 2017-03-27 MED ORDER — LIDOCAINE HCL 1 % IJ SOLN
3.0000 mL | INTRAMUSCULAR | Status: AC | PRN
  Administered 2017-03-27: 3 mL

## 2017-03-27 MED ORDER — HYALURONAN 88 MG/4ML IX SOSY
88.0000 mg | PREFILLED_SYRINGE | INTRA_ARTICULAR | Status: AC | PRN
  Administered 2017-03-27: 88 mg via INTRA_ARTICULAR

## 2017-03-27 NOTE — Progress Notes (Signed)
   Procedure Note  Patient: Toma Erichsen             Date of Birth: December 07, 1955           MRN: [id number]             Visit Date: 03/27/2017  Mrs. Meda Coffee comes in today for right knee minus injection. She states last cortisone injection on 01/31/17 did not help much with her knee. She comes in today with for Mono-Visc injection  Procedures: Visit Diagnoses: Unilateral primary osteoarthritis, right knee  Large Joint Inj Date/Time: 03/27/2017 3:39 PM Performed by: Pete Pelt Authorized by: Pete Pelt   Location:  Knee Site:  R knee Needle Size:  25 G Needle Length:  1.5 inches and 3.5 inches Approach:  Anterolateral Ultrasound Guidance: No   Fluoroscopic Guidance: No   Arthrogram: No   Medications:  3 mL lidocaine 1 %; 88 mg Hyaluronan 88 MG/4ML Aspiration Attempted: Yes   Aspirate amount (mL):  8 Patient tolerance:  Patient tolerated the procedure well with no immediate complications   Plan:She'll ice the knee tonight. Resume activities as tolerated. Follow-up with [place] in 8 weeks' check progress lack of.

## 2017-05-10 ENCOUNTER — Ambulatory Visit (INDEPENDENT_AMBULATORY_CARE_PROVIDER_SITE_OTHER): Payer: Managed Care, Other (non HMO) | Admitting: Orthopaedic Surgery

## 2017-05-10 DIAGNOSIS — M25561 Pain in right knee: Secondary | ICD-10-CM

## 2017-05-10 DIAGNOSIS — G8929 Other chronic pain: Secondary | ICD-10-CM

## 2017-05-10 NOTE — Progress Notes (Signed)
[name] comes in today for follow-up of her right knee. She had a hyaluronic acid injection place in that knee on June 18 of this year. She says since the fluid was drained of her knee and that injections place and she's feels much better overall. She still is a little sore on occasion. She takes occasional Tylenol for muscle pain and arthritis.  On examination there is no effusion of right knee today. She does have some medial joint line tenderness but excellent range of motion of the knee.  At this point I'll hold off on any other injection and wait at least 1 more month before considering a steroid injection. I'll order try alternating Aleve and Tylenol arthritis and explained to her in detail about this. She agrees with trying this as well with like to hold off on injection since she is not hurting as bad. In a month from now if she is still having pain we can place a steroid injection then.

## 2017-06-19 ENCOUNTER — Encounter (INDEPENDENT_AMBULATORY_CARE_PROVIDER_SITE_OTHER): Payer: Self-pay | Admitting: Orthopaedic Surgery

## 2017-06-19 ENCOUNTER — Ambulatory Visit (INDEPENDENT_AMBULATORY_CARE_PROVIDER_SITE_OTHER): Payer: Managed Care, Other (non HMO) | Admitting: Orthopaedic Surgery

## 2017-06-19 DIAGNOSIS — G8929 Other chronic pain: Secondary | ICD-10-CM

## 2017-06-19 DIAGNOSIS — M25561 Pain in right knee: Secondary | ICD-10-CM

## 2017-06-19 MED ORDER — LIDOCAINE HCL 1 % IJ SOLN
3.0000 mL | INTRAMUSCULAR | Status: AC | PRN
Start: 2017-06-19 — End: 2017-06-19
  Administered 2017-06-19: 3 mL

## 2017-06-19 MED ORDER — METHYLPREDNISOLONE ACETATE 40 MG/ML IJ SUSP
40.0000 mg | INTRAMUSCULAR | Status: AC | PRN
Start: 2017-06-19 — End: 2017-06-19
  Administered 2017-06-19: 40 mg via INTRA_ARTICULAR

## 2017-06-19 NOTE — Progress Notes (Signed)
   Procedure Note  Patient: Mallory [name]             Date of Birth: Nov 29, 1955           MRN: [id number]             Visit Date: 06/19/2017  Procedures: Visit Diagnoses: Chronic pain of right knee  Large Joint Inj Date/Time: 06/19/2017 3:31 PM Performed by: Mcarthur Rossetti Authorized by: Mcarthur Rossetti   Location:  Knee Site:  R knee Ultrasound Guidance: No   Fluoroscopic Guidance: No   Arthrogram: No   Medications:  3 mL lidocaine 1 %; 40 mg methylPREDNISolone acetate 40 MG/ML   The patient is mainly here today to have a steroid injection in her right knee. This is to treat chronic knee pain and osteophytes that knee. She is in between hyaluronic acid injections and felt that a steroid help her quite a bit. She said she is having better days did not. Her pain is better overall but still stays sore.  On examination of her right knee there is no effusion. There is slight tenderness globally with good range of motion. The knee feels stable ligaments.  We did talk about steroid injections and hyaluronic acid. We provided a steroid injection today easily. She says that she can call when he gets closer to needing a hyaluronic acid injections again. She understands she would need to be seen first.

## 2017-08-07 ENCOUNTER — Ambulatory Visit (INDEPENDENT_AMBULATORY_CARE_PROVIDER_SITE_OTHER): Payer: Managed Care, Other (non HMO) | Admitting: Orthopaedic Surgery

## 2017-08-07 ENCOUNTER — Ambulatory Visit (INDEPENDENT_AMBULATORY_CARE_PROVIDER_SITE_OTHER): Payer: Managed Care, Other (non HMO)

## 2017-08-07 ENCOUNTER — Other Ambulatory Visit: Payer: Self-pay | Admitting: Internal Medicine

## 2017-08-07 DIAGNOSIS — G8929 Other chronic pain: Secondary | ICD-10-CM | POA: Diagnosis not present

## 2017-08-07 DIAGNOSIS — M25561 Pain in right knee: Secondary | ICD-10-CM

## 2017-08-07 DIAGNOSIS — M1711 Unilateral primary osteoarthritis, right knee: Secondary | ICD-10-CM

## 2017-08-07 NOTE — Progress Notes (Signed)
The patient is well-known to me.  I followed her many years now for her right knee.  She has known arthritis in that knee and at one point we felt about her being a candidate for a partial knee arthroplasty.  However her knee is gotten worse she has problems going up and down stairs and there is significant grinding of the patellofemoral joint as well.  She has tried and failed multiple injections in her knee including steroids and hyaluronic acid.  She has been to physical therapy as well as a home exercise program.  I have been following her for many years now.  She is working on activity modification and anti-inflammatories.  She has not obese individual either.  Her pain is daily and is 10 out of 10.  At this point is detrimentally affect directed daily living, quality of life, and mobility.  On examination of her right knee there is significant patellofemoral crepitation and severe pain with extension of her knee.  She has significant medial joint line tenderness as well.  Her knee feels ligamentously stable with excellent range of motion but is globally tender and there is a slight effusion.  X-rays of her knee today compared to films that we first obtained in 2011 show worsening of the heel compartment arthritis of her knee as well as significant changes of the patellofemoral joint.  This is tricompartmental at this standpoint and has worsened significantly.  At this point I do not feel she is a candidate for partial knee arthroplasty but is a candidate for total knee arthroplasty given the worsening of her patellofemoral arthritic changes as well as her medial compartment.  Again she has tried and failed all forms of conservative treatment for multiple years now.  At this point she does wish to proceed with the replacement.  I agree with this at this point.  Again her pain is daily and is detrimentally affect directed daily living, quality of life, and her mobility.  Again she is tried for multiple  years steroid injections, hyaluronic acid injections, formal physical therapy and a home exercise program as well as anti-inflammatories and activity modification.  I had a long and thorough discussion with her about knee replacement surgery and showed her knee replacement model and went over her x-rays in detail.  All questions encouraged and answered.  We will work on getting the surgery scheduled soon.

## 2017-08-16 ENCOUNTER — Telehealth: Payer: Self-pay | Admitting: Internal Medicine

## 2017-08-16 MED ORDER — PRAVASTATIN SODIUM 10 MG PO TABS: 10.0000 mg | ORAL_TABLET | Freq: Every day | ORAL | 0 refills | Status: DC

## 2017-08-16 NOTE — Telephone Encounter (Signed)
Copied from [place] #[id number]. Topic: Quick Communication - See Telephone Encounter >> Aug 16, 2017 12:12 PM Burnis Medin, NT wrote: CRM for notification. See Telephone encounter for: Pharmacy is calling about a prescription that was sent back to the office for patient's medication. Medication is pravastatin (PRAVACHOL) 10 MG tablet. Pt. Is at the pharmacy now to pick medication up.  08/16/17.

## 2017-08-25 LAB — HM MAMMOGRAPHY

## 2017-08-28 ENCOUNTER — Encounter: Payer: Self-pay | Admitting: Internal Medicine

## 2017-08-29 ENCOUNTER — Other Ambulatory Visit (INDEPENDENT_AMBULATORY_CARE_PROVIDER_SITE_OTHER): Payer: Self-pay | Admitting: Physician Assistant

## 2017-09-06 NOTE — Pre-Procedure Instructions (Signed)
Mallory [name]  09/06/2017      [facility] - [place], [place] - [address] [place] [address] Phone: [phone] Fax: [phone]  [place], [place] - [address] [place] [address] Phone: [phone] Fax: [phone]    Your procedure is scheduled on December 5  Report to [facility] at [place].M.  Call this number if you have problems the morning of surgery:  [phone]   Remember:  Do not eat food or drink liquids after midnight.  Continue all medications as directed by your physician except follow these medication instructions before surgery below   Take these medicines the morning of surgery with A SIP OF WATER  acetaminophen (TYLENOL) pantoprazole (PROTONIX)    Do not wear jewelry, make-up or nail polish.  Do not wear lotions, powders, or perfumes, or deoderant.  Do not shave 48 hours prior to surgery.  Men may shave face and neck.  Do not bring valuables to the hospital.  [facility] is not responsible for any belongings or valuables.  Contacts, dentures or bridgework may not be worn into surgery.  Leave your suitcase in the car.  After surgery it may be brought to your room.  For patients admitted to the hospital, discharge time will be determined by your treatment team.  Patients discharged the day of surgery will not be allowed to drive home.    Special instructions:   [facility]- Preparing For Surgery  Before surgery, you can play an important role. Because skin is not sterile, your skin needs to be as free of germs as possible. You can reduce the number of germs on your skin by washing with CHG (chlorahexidine gluconate) Soap before surgery.  CHG is an antiseptic cleaner which kills germs and bonds with the skin to continue killing germs even after washing.  Please do not use if you have an allergy to CHG or antibacterial soaps. If  your skin becomes reddened/irritated stop using the CHG.  Do not shave (including legs and underarms) for at least 48 hours prior to first CHG shower. It is OK to shave your face.  Please follow these instructions carefully.   1. Shower the NIGHT BEFORE SURGERY and the MORNING OF SURGERY with CHG.   2. If you chose to wash your hair, wash your hair first as usual with your normal shampoo.  3. After you shampoo, rinse your hair and body thoroughly to remove the shampoo.  4. Use CHG as you would any other liquid soap. You can apply CHG directly to the skin and wash gently with a scrungie or a clean washcloth.   5. Apply the CHG Soap to your body ONLY FROM THE NECK DOWN.  Do not use on open wounds or open sores. Avoid contact with your eyes, ears, mouth and genitals (private parts). Wash Face and genitals (private parts)  with your normal soap.  6. Wash thoroughly, paying special attention to the area where your surgery will be performed.  7. Thoroughly rinse your body with warm water from the neck down.  8. DO NOT shower/wash with your normal soap after using and rinsing off the CHG Soap.  9. Pat yourself dry with a CLEAN TOWEL.  10. Wear CLEAN PAJAMAS to bed the night before surgery, wear comfortable clothes the morning of surgery  11. Place CLEAN SHEETS on your bed the night of  your first shower and DO NOT SLEEP WITH PETS.    Day of Surgery: Do not apply any deodorants/lotions. Please wear clean clothes to the hospital/surgery center.      Please read over the following fact sheets that you were given.

## 2017-09-07 ENCOUNTER — Encounter (HOSPITAL_COMMUNITY)
Admission: RE | Admit: 2017-09-07 | Discharge: 2017-09-07 | Disposition: A | Discharge status: Home / Self Care | Payer: Managed Care, Other (non HMO) | Source: Ambulatory Visit | Attending: Orthopaedic Surgery | Admitting: Orthopaedic Surgery

## 2017-09-07 ENCOUNTER — Other Ambulatory Visit: Payer: Self-pay

## 2017-09-07 ENCOUNTER — Encounter (HOSPITAL_COMMUNITY): Payer: Self-pay

## 2017-09-07 DIAGNOSIS — E785 Hyperlipidemia, unspecified: Secondary | ICD-10-CM | POA: Insufficient documentation

## 2017-09-07 DIAGNOSIS — M25561 Pain in right knee: Secondary | ICD-10-CM | POA: Insufficient documentation

## 2017-09-07 DIAGNOSIS — Z86711 Personal history of pulmonary embolism: Secondary | ICD-10-CM | POA: Insufficient documentation

## 2017-09-07 DIAGNOSIS — K589 Irritable bowel syndrome without diarrhea: Secondary | ICD-10-CM | POA: Diagnosis not present

## 2017-09-07 DIAGNOSIS — K219 Gastro-esophageal reflux disease without esophagitis: Secondary | ICD-10-CM | POA: Diagnosis not present

## 2017-09-07 DIAGNOSIS — Z01812 Encounter for preprocedural laboratory examination: Secondary | ICD-10-CM | POA: Diagnosis present

## 2017-09-07 DIAGNOSIS — G8929 Other chronic pain: Secondary | ICD-10-CM | POA: Diagnosis not present

## 2017-09-07 HISTORY — DX: Irritable bowel syndrome, unspecified: K58.9

## 2017-09-07 HISTORY — DX: Unilateral primary osteoarthritis, unspecified knee: M17.10

## 2017-09-07 HISTORY — DX: Osteoarthritis of knee, unspecified: M17.9

## 2017-09-07 LAB — CBC
HEMATOCRIT: 49.2 % — AB (ref 36.0–46.0)
HEMOGLOBIN: 16.5 g/dL — AB (ref 12.0–15.0)
MCH: 28.1 pg (ref 26.0–34.0)
MCHC: 33.5 g/dL (ref 30.0–36.0)
MCV: 83.8 fL (ref 78.0–100.0)
Platelets: 218 10*3/uL (ref 150–400)
RBC: 5.87 MIL/uL — AB (ref 3.87–5.11)
RDW: 13.7 % (ref 11.5–15.5)
WBC: 7.5 10*3/uL (ref 4.0–10.5)

## 2017-09-07 LAB — BASIC METABOLIC PANEL
ANION GAP: 6 (ref 5–15)
BUN: 11 mg/dL (ref 6–20)
CHLORIDE: 107 mmol/L (ref 101–111)
CO2: 25 mmol/L (ref 22–32)
Calcium: 9.1 mg/dL (ref 8.9–10.3)
Creatinine, Ser: 0.74 mg/dL (ref 0.44–1.00)
GFR calc Af Amer: >60 mL/min (ref >60)
Glucose, Bld: 109 mg/dL — ABNORMAL HIGH (ref 65–99)
POTASSIUM: 4.1 mmol/L (ref 3.5–5.1)
SODIUM: 138 mmol/L (ref 135–145)

## 2017-09-07 LAB — SURGICAL PCR SCREEN
MRSA, PCR: NEGATIVE
Staphylococcus aureus: NEGATIVE

## 2017-09-07 NOTE — Progress Notes (Signed)
PCP - Dr. Daryll Drown  Cardiologist - Denies  Chest x-ray - Denies  EKG - Denies  Stress Test - >10 yrs  ECHO - Denies  Cardiac Cath - Denies  Sleep Study - No CPAP - None   Pt denies having chest pain, sob, or fever at this time. All instructions explained to the pt, with a verbal understanding of the material. Pt agrees to go over the instructions while at home for a better understanding. The opportunity to ask questions was provided.

## 2017-09-11 ENCOUNTER — Other Ambulatory Visit (INDEPENDENT_AMBULATORY_CARE_PROVIDER_SITE_OTHER): Payer: Self-pay

## 2017-09-13 ENCOUNTER — Encounter (HOSPITAL_COMMUNITY)
Admission: RE | Disposition: A | Discharge status: Home Health | Payer: Self-pay | Source: Ambulatory Visit | Attending: Orthopaedic Surgery

## 2017-09-13 ENCOUNTER — Inpatient Hospital Stay (HOSPITAL_COMMUNITY): Payer: Managed Care, Other (non HMO) | Admitting: Anesthesiology

## 2017-09-13 ENCOUNTER — Inpatient Hospital Stay (HOSPITAL_COMMUNITY): Payer: Managed Care, Other (non HMO)

## 2017-09-13 ENCOUNTER — Inpatient Hospital Stay (HOSPITAL_COMMUNITY)
Admission: RE | Admit: 2017-09-13 | Discharge: 2017-09-16 | DRG: 470 | Disposition: A | Discharge status: Home Health | Payer: Managed Care, Other (non HMO) | Source: Ambulatory Visit | Attending: Orthopaedic Surgery | Admitting: Orthopaedic Surgery

## 2017-09-13 ENCOUNTER — Encounter (HOSPITAL_COMMUNITY): Payer: Self-pay | Admitting: Urology

## 2017-09-13 DIAGNOSIS — G8929 Other chronic pain: Secondary | ICD-10-CM | POA: Diagnosis present

## 2017-09-13 DIAGNOSIS — Z885 Allergy status to narcotic agent status: Secondary | ICD-10-CM | POA: Diagnosis not present

## 2017-09-13 DIAGNOSIS — Z86711 Personal history of pulmonary embolism: Secondary | ICD-10-CM | POA: Diagnosis not present

## 2017-09-13 DIAGNOSIS — M1711 Unilateral primary osteoarthritis, right knee: Secondary | ICD-10-CM | POA: Diagnosis present

## 2017-09-13 DIAGNOSIS — K219 Gastro-esophageal reflux disease without esophagitis: Secondary | ICD-10-CM | POA: Diagnosis present

## 2017-09-13 DIAGNOSIS — E785 Hyperlipidemia, unspecified: Secondary | ICD-10-CM | POA: Diagnosis present

## 2017-09-13 DIAGNOSIS — Z888 Allergy status to other drugs, medicaments and biological substances status: Secondary | ICD-10-CM | POA: Diagnosis not present

## 2017-09-13 DIAGNOSIS — Z96651 Presence of right artificial knee joint: Secondary | ICD-10-CM

## 2017-09-13 DIAGNOSIS — Z881 Allergy status to other antibiotic agents status: Secondary | ICD-10-CM

## 2017-09-13 HISTORY — PX: TOTAL KNEE ARTHROPLASTY: SHX125

## 2017-09-13 SURGERY — ARTHROPLASTY, KNEE, TOTAL
Anesthesia: Monitor Anesthesia Care | Site: Knee | Laterality: Right

## 2017-09-13 MED ORDER — PRAVASTATIN SODIUM 20 MG PO TABS
10.0000 mg | ORAL_TABLET | Freq: Every day | ORAL | Status: DC
  Administered 2017-09-13 – 2017-09-16 (×4): 10 mg via ORAL
  Filled 2017-09-13 (×4): qty 1

## 2017-09-13 MED ORDER — ALUM & MAG HYDROXIDE-SIMETH 200-200-20 MG/5ML PO SUSP: 30.0000 mL | ORAL | Status: DC | PRN

## 2017-09-13 MED ORDER — ACETAMINOPHEN 325 MG PO TABS: 650.0000 mg | ORAL_TABLET | ORAL | Status: DC | PRN

## 2017-09-13 MED ORDER — DEXAMETHASONE SODIUM PHOSPHATE 10 MG/ML IJ SOLN
INTRAMUSCULAR | Status: AC
  Filled 2017-09-13: qty 1

## 2017-09-13 MED ORDER — ONDANSETRON HCL 4 MG/2ML IJ SOLN
INTRAMUSCULAR | Status: DC | PRN
  Administered 2017-09-13: 4 mg via INTRAVENOUS

## 2017-09-13 MED ORDER — ROPIVACAINE HCL 7.5 MG/ML IJ SOLN
INTRAMUSCULAR | Status: DC | PRN
  Administered 2017-09-13: 20 mL via PERINEURAL

## 2017-09-13 MED ORDER — PROPOFOL 500 MG/50ML IV EMUL
INTRAVENOUS | Status: DC | PRN
  Administered 2017-09-13: 25 ug/kg/min via INTRAVENOUS

## 2017-09-13 MED ORDER — PHENOL 1.4 % MT LIQD: 1.0000 | OROMUCOSAL | Status: DC | PRN

## 2017-09-13 MED ORDER — ONDANSETRON HCL 4 MG PO TABS: 4.0000 mg | ORAL_TABLET | Freq: Four times a day (QID) | ORAL | Status: DC | PRN

## 2017-09-13 MED ORDER — DIPHENHYDRAMINE HCL 12.5 MG/5ML PO ELIX
12.5000 mg | ORAL_SOLUTION | ORAL | Status: DC | PRN
  Administered 2017-09-14: 25 mg via ORAL
  Filled 2017-09-13: qty 10

## 2017-09-13 MED ORDER — HYDROCODONE-ACETAMINOPHEN 5-325 MG PO TABS
ORAL_TABLET | ORAL | Status: AC
  Administered 2017-09-13: 1 via ORAL
  Filled 2017-09-13: qty 2

## 2017-09-13 MED ORDER — 0.9 % SODIUM CHLORIDE (POUR BTL) OPTIME
TOPICAL | Status: DC | PRN
  Administered 2017-09-13: 1000 mL

## 2017-09-13 MED ORDER — SODIUM CHLORIDE 0.9 % IR SOLN
Status: DC | PRN
  Administered 2017-09-13: 3000 mL

## 2017-09-13 MED ORDER — DEXAMETHASONE SODIUM PHOSPHATE 10 MG/ML IJ SOLN
INTRAMUSCULAR | Status: DC | PRN
  Administered 2017-09-13: 10 mg via INTRAVENOUS

## 2017-09-13 MED ORDER — PROMETHAZINE HCL 25 MG/ML IJ SOLN: 6.2500 mg | INTRAMUSCULAR | Status: DC | PRN

## 2017-09-13 MED ORDER — CEFAZOLIN SODIUM-DEXTROSE 2-4 GM/100ML-% IV SOLN
2.0000 g | INTRAVENOUS | Status: AC
  Administered 2017-09-13: 2 g via INTRAVENOUS
  Filled 2017-09-13: qty 100

## 2017-09-13 MED ORDER — LACTATED RINGERS IV SOLN
INTRAVENOUS | Status: DC | PRN
  Administered 2017-09-13 (×2): via INTRAVENOUS

## 2017-09-13 MED ORDER — HYDROCODONE-ACETAMINOPHEN 5-325 MG PO TABS
1.0000 | ORAL_TABLET | ORAL | Status: DC | PRN
  Administered 2017-09-13: 2 via ORAL
  Administered 2017-09-13: 1 via ORAL
  Administered 2017-09-14 – 2017-09-15 (×3): 2 via ORAL
  Filled 2017-09-13 (×4): qty 2

## 2017-09-13 MED ORDER — FENTANYL CITRATE (PF) 250 MCG/5ML IJ SOLN
INTRAMUSCULAR | Status: AC
  Filled 2017-09-13: qty 5

## 2017-09-13 MED ORDER — SODIUM CHLORIDE 0.9 % IV SOLN
INTRAVENOUS | Status: DC
  Administered 2017-09-13: 13:00:00 via INTRAVENOUS

## 2017-09-13 MED ORDER — METHOCARBAMOL 500 MG PO TABS
ORAL_TABLET | ORAL | Status: AC
  Administered 2017-09-13: 500 mg via ORAL
  Filled 2017-09-13: qty 1

## 2017-09-13 MED ORDER — EPHEDRINE SULFATE 50 MG/ML IJ SOLN
INTRAMUSCULAR | Status: DC | PRN
  Administered 2017-09-13 (×3): 10 mg via INTRAVENOUS

## 2017-09-13 MED ORDER — ASPIRIN EC 325 MG PO TBEC
325.0000 mg | DELAYED_RELEASE_TABLET | Freq: Two times a day (BID) | ORAL | Status: DC
  Administered 2017-09-14 (×2): 325 mg via ORAL
  Filled 2017-09-13 (×2): qty 1

## 2017-09-13 MED ORDER — ACETAMINOPHEN 650 MG RE SUPP: 650.0000 mg | RECTAL | Status: DC | PRN

## 2017-09-13 MED ORDER — ONDANSETRON HCL 4 MG/2ML IJ SOLN
INTRAMUSCULAR | Status: AC
  Filled 2017-09-13: qty 2

## 2017-09-13 MED ORDER — MORPHINE SULFATE (PF) 4 MG/ML IV SOLN
INTRAVENOUS | Status: AC
  Administered 2017-09-13: 2 mg via INTRAVENOUS
  Filled 2017-09-13: qty 1

## 2017-09-13 MED ORDER — MENTHOL 3 MG MT LOZG: 1.0000 | LOZENGE | OROMUCOSAL | Status: DC | PRN

## 2017-09-13 MED ORDER — PROPOFOL 10 MG/ML IV BOLUS
INTRAVENOUS | Status: DC | PRN
  Administered 2017-09-13: 20 mg via INTRAVENOUS

## 2017-09-13 MED ORDER — MORPHINE SULFATE (PF) 2 MG/ML IV SOLN
2.0000 mg | INTRAVENOUS | Status: DC | PRN
  Administered 2017-09-14: 2 mg via INTRAVENOUS
  Filled 2017-09-13: qty 1

## 2017-09-13 MED ORDER — CEFAZOLIN SODIUM-DEXTROSE 1-4 GM/50ML-% IV SOLN
1.0000 g | Freq: Four times a day (QID) | INTRAVENOUS | Status: AC
  Administered 2017-09-13 (×2): 1 g via INTRAVENOUS
  Filled 2017-09-13 (×2): qty 50

## 2017-09-13 MED ORDER — LIDOCAINE 2% (20 MG/ML) 5 ML SYRINGE
INTRAMUSCULAR | Status: AC
  Filled 2017-09-13: qty 5

## 2017-09-13 MED ORDER — PROPOFOL 10 MG/ML IV BOLUS
INTRAVENOUS | Status: AC
  Filled 2017-09-13: qty 40

## 2017-09-13 MED ORDER — ZOLPIDEM TARTRATE 5 MG PO TABS
5.0000 mg | ORAL_TABLET | Freq: Every evening | ORAL | Status: DC | PRN
  Filled 2017-09-13: qty 1

## 2017-09-13 MED ORDER — MIDAZOLAM HCL 5 MG/5ML IJ SOLN
INTRAMUSCULAR | Status: DC | PRN
  Administered 2017-09-13: .5 mg via INTRAVENOUS
  Administered 2017-09-13: 1 mg via INTRAVENOUS
  Administered 2017-09-13: .5 mg via INTRAVENOUS

## 2017-09-13 MED ORDER — DOCUSATE SODIUM 100 MG PO CAPS
100.0000 mg | ORAL_CAPSULE | Freq: Two times a day (BID) | ORAL | Status: DC
  Administered 2017-09-13 – 2017-09-16 (×6): 100 mg via ORAL
  Filled 2017-09-13 (×6): qty 1

## 2017-09-13 MED ORDER — OXYCODONE HCL 5 MG PO TABS
10.0000 mg | ORAL_TABLET | ORAL | Status: DC | PRN
  Administered 2017-09-13 – 2017-09-14 (×4): 10 mg via ORAL
  Filled 2017-09-13 (×4): qty 2

## 2017-09-13 MED ORDER — METHOCARBAMOL 500 MG PO TABS
500.0000 mg | ORAL_TABLET | Freq: Four times a day (QID) | ORAL | Status: DC | PRN
  Administered 2017-09-13 – 2017-09-16 (×5): 500 mg via ORAL
  Filled 2017-09-13 (×4): qty 1

## 2017-09-13 MED ORDER — MIDAZOLAM HCL 2 MG/2ML IJ SOLN
INTRAMUSCULAR | Status: AC
  Filled 2017-09-13: qty 2

## 2017-09-13 MED ORDER — POLYETHYLENE GLYCOL 3350 17 G PO PACK: 17.0000 g | PACK | Freq: Every day | ORAL | Status: DC | PRN

## 2017-09-13 MED ORDER — ONDANSETRON HCL 4 MG/2ML IJ SOLN: 4.0000 mg | Freq: Four times a day (QID) | INTRAMUSCULAR | Status: DC | PRN

## 2017-09-13 MED ORDER — PANTOPRAZOLE SODIUM 20 MG PO TBEC
20.0000 mg | DELAYED_RELEASE_TABLET | Freq: Two times a day (BID) | ORAL | Status: DC
  Administered 2017-09-14 – 2017-09-16 (×4): 20 mg via ORAL
  Filled 2017-09-13 (×6): qty 1

## 2017-09-13 MED ORDER — METOCLOPRAMIDE HCL 5 MG/ML IJ SOLN
5.0000 mg | Freq: Three times a day (TID) | INTRAMUSCULAR | Status: DC | PRN
Start: 2017-09-13 — End: 2017-09-16

## 2017-09-13 MED ORDER — CHLORHEXIDINE GLUCONATE 4 % EX LIQD: 60.0000 mL | Freq: Once | CUTANEOUS | Status: DC

## 2017-09-13 MED ORDER — METHOCARBAMOL 1000 MG/10ML IJ SOLN
500.0000 mg | Freq: Four times a day (QID) | INTRAVENOUS | Status: DC | PRN
  Filled 2017-09-13: qty 5

## 2017-09-13 MED ORDER — METOCLOPRAMIDE HCL 5 MG PO TABS: 5.0000 mg | ORAL_TABLET | Freq: Three times a day (TID) | ORAL | Status: DC | PRN

## 2017-09-13 MED ORDER — FENTANYL CITRATE (PF) 100 MCG/2ML IJ SOLN
INTRAMUSCULAR | Status: DC | PRN
  Administered 2017-09-13 (×5): 50 ug via INTRAVENOUS

## 2017-09-13 MED ORDER — PHENYLEPHRINE HCL 10 MG/ML IJ SOLN
INTRAMUSCULAR | Status: DC | PRN
  Administered 2017-09-13: 40 ug via INTRAVENOUS
  Administered 2017-09-13 (×3): 80 ug via INTRAVENOUS

## 2017-09-13 MED ORDER — MORPHINE SULFATE (PF) 4 MG/ML IV SOLN
1.0000 mg | INTRAVENOUS | Status: DC | PRN
  Administered 2017-09-13: 2 mg via INTRAVENOUS

## 2017-09-13 MED ORDER — LIDOCAINE HCL (CARDIAC) 20 MG/ML IV SOLN
INTRAVENOUS | Status: DC | PRN
  Administered 2017-09-13: 40 mg via INTRAVENOUS
  Administered 2017-09-13: 60 mg via INTRAVENOUS

## 2017-09-13 SURGICAL SUPPLY — 63 items
APL SKNCLS STERI-STRIP NONHPOA (GAUZE/BANDAGES/DRESSINGS) ×1
BANDAGE ACE 6X5 VEL STRL LF (GAUZE/BANDAGES/DRESSINGS) ×2 IMPLANT
BANDAGE ELASTIC 6 VELCRO ST LF (GAUZE/BANDAGES/DRESSINGS) ×1 IMPLANT
BANDAGE ESMARK 6X9 LF (GAUZE/BANDAGES/DRESSINGS) ×1 IMPLANT
BENZOIN TINCTURE PRP APPL 2/3 (GAUZE/BANDAGES/DRESSINGS) ×1 IMPLANT
BLADE SAG 18X100X1.27 (BLADE) ×2 IMPLANT
BNDG CMPR 9X6 STRL LF SNTH (GAUZE/BANDAGES/DRESSINGS) ×1
BNDG ESMARK 6X9 LF (GAUZE/BANDAGES/DRESSINGS) ×2
BOWL SMART MIX CTS (DISPOSABLE) ×1 IMPLANT
CAPT KNEE TRIATH TK-4 ×1 IMPLANT
COVER SURGICAL LIGHT HANDLE (MISCELLANEOUS) ×2 IMPLANT
CUFF TOURNIQUET SINGLE 34IN LL (TOURNIQUET CUFF) ×2 IMPLANT
CUFF TOURNIQUET SINGLE 44IN (TOURNIQUET CUFF) IMPLANT
DRAPE EXTREMITY T 121X128X90 (DRAPE) ×2 IMPLANT
DRAPE HALF SHEET 40X57 (DRAPES) ×2 IMPLANT
DRAPE U-SHAPE 47X51 STRL (DRAPES) ×2 IMPLANT
DRSG PAD ABDOMINAL 8X10 ST (GAUZE/BANDAGES/DRESSINGS) ×2 IMPLANT
DURAPREP 26ML APPLICATOR (WOUND CARE) ×2 IMPLANT
ELECT CAUTERY BLADE 6.4 (BLADE) ×2 IMPLANT
ELECT REM PT RETURN 9FT ADLT (ELECTROSURGICAL) ×2
ELECTRODE REM PT RTRN 9FT ADLT (ELECTROSURGICAL) ×1 IMPLANT
FACESHIELD WRAPAROUND (MASK) ×4 IMPLANT
FACESHIELD WRAPAROUND OR TEAM (MASK) ×2 IMPLANT
GAUZE SPONGE 4X4 12PLY STRL (GAUZE/BANDAGES/DRESSINGS) ×1 IMPLANT
GAUZE XEROFORM 1X8 LF (GAUZE/BANDAGES/DRESSINGS) ×1 IMPLANT
GLOVE BIOGEL PI IND STRL 8 (GLOVE) ×2 IMPLANT
GLOVE BIOGEL PI INDICATOR 8 (GLOVE) ×2
GLOVE ORTHO TXT STRL SZ7.5 (GLOVE) ×2 IMPLANT
GLOVE SURG ORTHO 8.0 STRL STRW (GLOVE) ×2 IMPLANT
GOWN STRL REUS W/ TWL LRG LVL3 (GOWN DISPOSABLE) IMPLANT
GOWN STRL REUS W/ TWL XL LVL3 (GOWN DISPOSABLE) ×2 IMPLANT
GOWN STRL REUS W/TWL LRG LVL3 (GOWN DISPOSABLE)
GOWN STRL REUS W/TWL XL LVL3 (GOWN DISPOSABLE) ×4
HANDPIECE INTERPULSE COAX TIP (DISPOSABLE) ×2
IMMOBILIZER KNEE 22 UNIV (SOFTGOODS) ×2 IMPLANT
KIT BASIN OR (CUSTOM PROCEDURE TRAY) ×2 IMPLANT
KIT ROOM TURNOVER OR (KITS) ×2 IMPLANT
MANIFOLD NEPTUNE II (INSTRUMENTS) ×2 IMPLANT
NDL SAFETY ECLIPSE 18X1.5 (NEEDLE) IMPLANT
NEEDLE HYPO 18GX1.5 SHARP (NEEDLE)
NS IRRIG 1000ML POUR BTL (IV SOLUTION) ×2 IMPLANT
PACK TOTAL JOINT (CUSTOM PROCEDURE TRAY) ×2 IMPLANT
PAD ARMBOARD 7.5X6 YLW CONV (MISCELLANEOUS) ×4 IMPLANT
PADDING CAST COTTON 6X4 STRL (CAST SUPPLIES) ×2 IMPLANT
SET HNDPC FAN SPRY TIP SCT (DISPOSABLE) ×1 IMPLANT
SET PAD KNEE POSITIONER (MISCELLANEOUS) ×2 IMPLANT
STAPLER VISISTAT 35W (STAPLE) IMPLANT
STRIP CLOSURE SKIN 1/2X4 (GAUZE/BANDAGES/DRESSINGS) ×2 IMPLANT
SUCTION FRAZIER HANDLE 10FR (MISCELLANEOUS) ×1
SUCTION TUBE FRAZIER 10FR DISP (MISCELLANEOUS) ×1 IMPLANT
SUT MNCRL AB 4-0 PS2 18 (SUTURE) ×1 IMPLANT
SUT VIC AB 0 CT1 27 (SUTURE) ×2
SUT VIC AB 0 CT1 27XBRD ANBCTR (SUTURE) ×1 IMPLANT
SUT VIC AB 1 CT1 27 (SUTURE) ×4
SUT VIC AB 1 CT1 27XBRD ANBCTR (SUTURE) ×2 IMPLANT
SUT VIC AB 2-0 CT1 27 (SUTURE) ×4
SUT VIC AB 2-0 CT1 TAPERPNT 27 (SUTURE) ×2 IMPLANT
SYR 50ML LL SCALE MARK (SYRINGE) IMPLANT
TOWEL OR 17X24 6PK STRL BLUE (TOWEL DISPOSABLE) ×2 IMPLANT
TOWEL OR 17X26 10 PK STRL BLUE (TOWEL DISPOSABLE) ×2 IMPLANT
TRAY CATH 16FR W/PLASTIC CATH (SET/KITS/TRAYS/PACK) IMPLANT
TRAY FOLEY CATH SILVER 16FR (SET/KITS/TRAYS/PACK) ×1 IMPLANT
WRAP KNEE MAXI GEL POST OP (GAUZE/BANDAGES/DRESSINGS) ×2 IMPLANT

## 2017-09-13 NOTE — Brief Op Note (Signed)
09/13/2017  10:06 AM  PATIENT:  Mallory [name]  61 y.o. female  PRE-OPERATIVE DIAGNOSIS:  osteoarthritis right knee  POST-OPERATIVE DIAGNOSIS:  osteoarthritis right knee  PROCEDURE:  Procedure(s): RIGHT TOTAL KNEE ARTHROPLASTY (Right)  SURGEON:  Surgeon(s) and Role:    Mcarthur Rossetti, MD - Primary  PHYSICIAN ASSISTANT: Benita Stabile, PA-C  ANESTHESIA:   regional and spinal  EBL:  < 100 cc   COUNTS:  YES  TOURNIQUET:   Total Tourniquet Time Documented: Thigh (Right) - 43 minutes Total: Thigh (Right) - 43 minutes   DICTATION: .Other Dictation: Dictation Number [id number]  PLAN OF CARE: Admit to inpatient   PATIENT DISPOSITION:  PACU - hemodynamically stable.   Delay start of Pharmacological VTE agent (>24hrs) due to surgical blood loss or risk of bleeding: no

## 2017-09-13 NOTE — Progress Notes (Signed)
Orthopedic Tech Progress Note Patient Details:  Dailah Opperman Sep 14, 1956 [id number]  CPM Right Knee CPM Right Knee: On Right Knee Flexion (Degrees): 90 Right Knee Extension (Degrees): 0  Post Interventions Patient Tolerated: Well Instructions Provided: Care of device  Hildred Priest 09/13/2017, 11:04 AM ohf not available at this time; additional frames for new hospital beds are yet on order; RN notified

## 2017-09-13 NOTE — Transfer of Care (Signed)
Immediate Anesthesia Transfer of Care Note  Patient: Mallory [name]  Procedure(s) Performed: RIGHT TOTAL KNEE ARTHROPLASTY (Right Knee)  Patient Location: PACU  Anesthesia Type:MAC, Regional and Spinal  Level of Consciousness: awake, alert , oriented and sedated  Airway & Oxygen Therapy: Patient Spontanous Breathing and Patient connected to nasal cannula oxygen  Post-op Assessment: Report given to RN, Post -op Vital signs reviewed and stable and Patient moving all extremities  Post vital signs: Reviewed and stable  Last Vitals:  Vitals:   09/13/17 0701  BP: 113/64  Pulse: 63  Resp: 18  Temp: 36.6 C  SpO2: 95%    Last Pain:  Vitals:   09/13/17 0701  TempSrc: Oral  PainSc:          Complications: No apparent anesthesia complications

## 2017-09-13 NOTE — Anesthesia Procedure Notes (Signed)
Spinal  Patient location during procedure: OR Start time: 09/13/2017 8:32 AM End time: 09/13/2017 8:42 AM Staffing Anesthesiologist: Rica Koyanagi, MD Performed: anesthesiologist  Preanesthetic Checklist Completed: patient identified, site marked, pre-op evaluation, timeout performed, IV checked, risks and benefits discussed and monitors and equipment checked Spinal Block Patient position: sitting Prep: ChloraPrep and site prepped and draped Patient monitoring: heart rate, cardiac monitor, continuous pulse ox and blood pressure Approach: midline Location: L3-4 Injection technique: single-shot Needle Needle type: Pencan  Needle gauge: 24 G Needle length: 9 cm Needle insertion depth: 4 cm Assessment Sensory level: T8

## 2017-09-13 NOTE — Anesthesia Procedure Notes (Addendum)
Anesthesia Regional Block: Adductor canal block   Pre-Anesthetic Checklist: ,, timeout performed, Correct Patient, Correct Site, Correct Laterality, Correct Procedure, Correct Position, site marked, Risks and benefits discussed,  Surgical consent,  Pre-op evaluation,  At surgeon's request and post-op pain management  Laterality: Right and Lower  Prep: chloraprep       Needles:   Needle Type: Echogenic Stimulator Needle     Needle Length: 9cm  Needle Gauge: 21   Needle insertion depth: 6 cm   Additional Needles:   Procedures:,,,, ultrasound used (permanent image in chart),,,,  Narrative:  Start time: 09/13/2017 7:45 AM End time: 09/13/2017 8:00 AM Injection made incrementally with aspirations every 5 mL.  Performed by: Personally  Anesthesiologist: Rica Koyanagi, MD

## 2017-09-13 NOTE — H&P (Signed)
TOTAL KNEE ADMISSION H&P  Patient is being admitted for right total knee arthroplasty.  Subjective:  Chief Complaint:right knee pain.  HPI: Mallory [name], 61 y.o. female, has a history of pain and functional disability in the right knee due to arthritis and has failed non-surgical conservative treatments for greater than 12 weeks to includeNSAID's and/or analgesics, corticosteriod injections, viscosupplementation injections, flexibility and strengthening excercises and activity modification.  Onset of symptoms was gradual, starting 5 years ago with gradually worsening course since that time. The patient noted no past surgery on the right knee(s).  Patient currently rates pain in the right knee(s) at 10 out of 10 with activity. Patient has night pain, worsening of pain with activity and weight bearing, pain that interferes with activities of daily living, pain with passive range of motion, crepitus and joint swelling.  Patient has evidence of subchondral sclerosis, periarticular osteophytes and joint space narrowing by imaging studies. There is no active infection.  Patient Active Problem List   Diagnosis Date Noted  . Status post total right knee replacement 09/13/2017  . IBS (irritable bowel syndrome) 12/06/2016  . Chronic pain of right knee 12/06/2016  . HLD (hyperlipidemia) 11/16/2015  . GERD (gastroesophageal reflux disease) 08/14/2011  . History of pulmonary embolus (PE) 08/14/2011   Past Medical History:  Diagnosis Date  . GERD (gastroesophageal reflux disease)   . HLD (hyperlipidemia)   . IBS (irritable bowel syndrome)   . Osteoarthritis of knee    Right  . Pulmonary embolism ([place])    5 yrs ago (unknown region)    Past Surgical History:  Procedure Laterality Date  . [place]   mc  . CHOLECYSTECTOMY  15 yrs ago   mc  . COLONOSCOPY  05/24/2012   Procedure: COLONOSCOPY;  Surgeon: Rogene Houston, MD;  Location: [facility];  Service: Endoscopy;  Laterality:  N/A;  220  . ERCP  08/12/2011   Procedure: ENDOSCOPIC RETROGRADE CHOLANGIOPANCREATOGRAPHY (ERCP);  Surgeon: Rogene Houston, MD;  Location: [facility];  Service: Endoscopy;  Laterality: N/A;  . ERCP W/ SPHICTEROTOMY  08/12/11   Dr Rehman-?microlithiasis, SOD  . ESOPHAGOGASTRODUODENOSCOPY  11/25/2011   Procedure: ESOPHAGOGASTRODUODENOSCOPY (EGD);  Surgeon: Rogene Houston, MD;  Location: [facility];  Service: Endoscopy;  Laterality: N/A;  830  . EUS  01/19/2012   Procedure: UPPER ENDOSCOPIC ULTRASOUND (EUS) LINEAR;  Surgeon: Milus Banister, MD;  Location: [facility];  Service: Endoscopy;  Laterality: N/A;  pt moved up a hour early by AW , Patty @ [facility] office ok'd / pt was called by AW  . SPHINCTEROTOMY  08/12/2011   Procedure: SPHINCTEROTOMY;  Surgeon: Rogene Houston, MD;  Location: [facility];  Service: Endoscopy;;  with ballon passage  . TUBAL LIGATION      Current Facility-Administered Medications  Medication Dose Route Frequency Provider Last Rate Last Dose  . ceFAZolin (ANCEF) IVPB 2g/100 mL premix  2 g Intravenous On Call to OR Pete Pelt, PA-C      . chlorhexidine (HIBICLENS) 4 % liquid 4 application  60 mL Topical Once Pete Pelt, PA-C       Allergies  Allergen Reactions  . Promethazine Hcl Hives  . Hydromorphone Itching  . Nitrofurantoin Monohyd Macro Itching    Social History   Tobacco Use  . Smoking status: Never Smoker  . Smokeless tobacco: Never Used  Substance Use Topics  . Alcohol use: Yes    Alcohol/week: 0.0 oz    Comment: occasional  wine    Family History  Problem Relation Age of Onset  . Diabetes Mother   . Heart failure Father   . Diabetes Brother   . Heart disease Sister   . Heart disease Maternal Grandmother   . Cancer Maternal Grandfather        ? pancreatic or lung  . Anesthesia problems Neg Hx   . Hypotension Neg Hx   . Malignant hyperthermia Neg Hx   . Pseudochol deficiency Neg Hx   . Stroke Neg Hx      Review of Systems   Musculoskeletal: Positive for joint pain.  All other systems reviewed and are negative.   Objective:  Physical Exam  Constitutional: She is oriented to person, place, and time. She appears well-developed and well-nourished.  HENT:  Head: Normocephalic and atraumatic.  Eyes: EOM are normal. Pupils are equal, round, and reactive to light.  Neck: Normal range of motion. Neck supple.  Cardiovascular: Normal rate and regular rhythm.  Respiratory: Effort normal and breath sounds normal.  GI: Soft. Bowel sounds are normal.  Musculoskeletal:       Right knee: She exhibits decreased range of motion, swelling, effusion and abnormal alignment. Tenderness found. Medial joint line and lateral joint line tenderness noted.  Neurological: She is alert and oriented to person, place, and time.  Skin: Skin is warm and dry.  Psychiatric: She has a normal mood and affect.    Vital signs in last 24 hours: Temp:  [97.8 F (36.6 C)] 97.8 F (36.6 C) (12/05 0701) Pulse Rate:  [63] 63 (12/05 0701) Resp:  [18] 18 (12/05 0701) BP: (113)/(64) 113/64 (12/05 0701) SpO2:  [95 %] 95 % (12/05 0701)  Labs:   Estimated body mass index is 32.25 kg/m as calculated from the following:   Height as of 09/07/17: 5\' 3"  (1.6 m).   Weight as of 09/07/17: 182 lb 1 oz (82.6 kg).   Imaging Review Plain radiographs demonstrate severe degenerative joint disease of the right knee(s). The overall alignment ismild varus. The bone quality appears to be excellent for age and reported activity level.  Assessment/Plan:  End stage arthritis, right knee   The patient history, physical examination, clinical judgment of the provider and imaging studies are consistent with end stage degenerative joint disease of the right knee(s) and total knee arthroplasty is deemed medically necessary. The treatment options including medical management, injection therapy arthroscopy and arthroplasty were discussed at length. The risks and  benefits of total knee arthroplasty were presented and reviewed. The risks due to aseptic loosening, infection, stiffness, patella tracking problems, thromboembolic complications and other imponderables were discussed. The patient acknowledged the explanation, agreed to proceed with the plan and consent was signed. Patient is being admitted for inpatient treatment for surgery, pain control, PT, OT, prophylactic antibiotics, VTE prophylaxis, progressive ambulation and ADL's and discharge planning. The patient is planning to be discharged home with home health services

## 2017-09-13 NOTE — Anesthesia Preprocedure Evaluation (Addendum)
Anesthesia Evaluation  Patient identified by MRN, date of birth, ID band Patient awake    Reviewed: Allergy & Precautions, NPO status , Patient's Chart, lab work & pertinent test results  Airway Mallampati: II  TM Distance: >3 FB Neck ROM: Full    Dental no notable dental hx.    Pulmonary  Hx of pulm embolism   breath sounds clear to auscultation       Cardiovascular negative cardio ROS   Rhythm:Regular Rate:Normal     Neuro/Psych    GI/Hepatic Neg liver ROS, GERD  ,  Endo/Other  negative endocrine ROS  Renal/GU negative Renal ROS     Musculoskeletal  (+) Arthritis ,   Abdominal   Peds  Hematology negative hematology ROS (+)   Anesthesia Other Findings   Reproductive/Obstetrics                            Anesthesia Physical Anesthesia Plan  ASA: II  Anesthesia Plan: Spinal and Regional   Post-op Pain Management:    Induction: Intravenous  PONV Risk Score and Plan:   Airway Management Planned: Simple Face Mask and Natural Airway  Additional Equipment:   Intra-op Plan:   Post-operative Plan:   Informed Consent: I have reviewed the patients History and Physical, chart, labs and discussed the procedure including the risks, benefits and alternatives for the proposed anesthesia with the patient or authorized representative who has indicated his/her understanding and acceptance.     Plan Discussed with: CRNA  Anesthesia Plan Comments:        Anesthesia Quick Evaluation

## 2017-09-13 NOTE — Evaluation (Signed)
Physical Therapy Evaluation Patient Details Name: Mallory [name] MRN: [id number] DOB: 1955-11-26 Today's Date: 09/13/2017   History of Present Illness  Pt is a 61 y/o female s/p elective R TKA. PMH includes PE and GERD.   Clinical Impression  Pt is s/p surgery above with deficits below. PTA, pt was independent with functional mobility. Upon eval, pt presenting with post op pain and weakness, and slightly decreased balance. Pt limited in gait distance secondary to increased pain; RN notified and gave pain meds during session. Reports her boyfriend will be staying with her at d/c. Will need DME below and reports she will be getting HHPT at d/c. Will continue to follow acutely to maximize functional mobility independence and safety.     Follow Up Recommendations DC plan and follow up therapy as arranged by surgeon;Supervision for mobility/OOB    Equipment Recommendations  Rolling walker with 5" wheels;3in1 (PT)    Recommendations for Other Services       Precautions / Restrictions Precautions Precautions: Knee Precaution Booklet Issued: Yes (comment) Precaution Comments: Reviewed supine knee ther ex.  Restrictions Weight Bearing Restrictions: Yes RLE Weight Bearing: Weight bearing as tolerated      Mobility  Bed Mobility Overal bed mobility: Needs Assistance Bed Mobility: Supine to Sit     Supine to sit: Min assist     General bed mobility comments: Min A for RLE assist. Verbal cues for sequencing.   Transfers Overall transfer level: Needs assistance Equipment used: Rolling walker (2 wheeled) Transfers: Sit to/from Stand Sit to Stand: Min guard         General transfer comment: Min guard for safety. Verbal cues for safe hand placement.   Ambulation/Gait Ambulation/Gait assistance: Min guard Ambulation Distance (Feet): 5 Feet Assistive device: Rolling walker (2 wheeled) Gait Pattern/deviations: Step-to pattern;Decreased step length - right;Decreased step length -  left;Decreased weight shift to right;Antalgic Gait velocity: Decreased Gait velocity interpretation: Below normal speed for age/gender General Gait Details: Slow, antalgic gait. Verbal cues for sequencing using RW. Distance limited secondary to pain.   Stairs            Wheelchair Mobility    Modified Rankin (Stroke Patients Only)       Balance Overall balance assessment: Needs assistance Sitting-balance support: No upper extremity supported;Feet supported Sitting balance-Leahy Scale: Good     Standing balance support: Bilateral upper extremity supported;During functional activity Standing balance-Leahy Scale: Poor Standing balance comment: Reliant on UE support.                              Pertinent Vitals/Pain Pain Assessment: 0-10 Pain Score: 6  Pain Location: R knee  Pain Descriptors / Indicators: Aching;Operative site guarding Pain Intervention(s): Limited activity within patient's tolerance;Monitored during session;Repositioned    Home Living Family/patient expects to be discharged to:: Private residence Living Arrangements: Spouse/significant other Available Help at Discharge: Family;Available 24 hours/day Type of Home: House Home Access: Stairs to enter Entrance Stairs-Rails: Left Entrance Stairs-Number of Steps: 2 Home Layout: One level Home Equipment: Walker - 4 wheels;Walker - standard;Shower seat - built in      Prior Function Level of Independence: Independent               Hand Dominance   Dominant Hand: Right    Extremity/Trunk Assessment   Upper Extremity Assessment Upper Extremity Assessment: Defer to OT evaluation    Lower Extremity Assessment Lower Extremity Assessment: RLE deficits/detail RLE Deficits /  Details: Reports numbness at groin. Deficits consistent with post op pain and weakness. Able to perform ther ex below.     Cervical / Trunk Assessment Cervical / Trunk Assessment: Normal  Communication    Communication: No difficulties  Cognition Arousal/Alertness: Awake/alert Behavior During Therapy: WFL for tasks assessed/performed Overall Cognitive Status: Within Functional Limits for tasks assessed                                        General Comments General comments (skin integrity, edema, etc.): Pt's boyfriend present during session. Limited tolerance for exercise secondary to pain in R knee.     Exercises Total Joint Exercises Ankle Circles/Pumps: AROM;Both;20 reps Quad Sets: AROM;Right;10 reps Towel Squeeze: AROM;Both;10 reps   Assessment/Plan    PT Assessment Patient needs continued PT services  PT Problem List Decreased strength;Decreased range of motion;Decreased activity tolerance;Decreased balance;Decreased knowledge of use of DME;Decreased knowledge of precautions;Pain       PT Treatment Interventions DME instruction;Gait training;Stair training;Functional mobility training;Therapeutic activities;Therapeutic exercise;Balance training;Neuromuscular re-education;Patient/family education    PT Goals (Current goals can be found in the Care Plan section)  Acute Rehab PT Goals Patient Stated Goal: to decrease pain  PT Goal Formulation: With patient Time For Goal Achievement: 09/20/17 Potential to Achieve Goals: Good    Frequency 7X/week   Barriers to discharge        Co-evaluation               AM-PAC PT "6 Clicks" Daily Activity  Outcome Measure Difficulty turning over in bed (including adjusting bedclothes, sheets and blankets)?: A Little Difficulty moving from lying on back to sitting on the side of the bed? : Unable Difficulty sitting down on and standing up from a chair with arms (e.g., wheelchair, bedside commode, etc,.)?: Unable Help needed moving to and from a bed to chair (including a wheelchair)?: A Little Help needed walking in hospital room?: A Little Help needed climbing 3-5 steps with a railing? : A Lot 6 Click Score:  13    End of Session Equipment Utilized During Treatment: Gait belt;Right knee immobilizer Activity Tolerance: Patient limited by pain Patient left: in chair;with call bell/phone within reach;with family/visitor present Nurse Communication: Mobility status PT Visit Diagnosis: Other abnormalities of gait and mobility (R26.89);Pain Pain - Right/Left: Right Pain - part of body: Knee    Time: 1411-1445 PT Time Calculation (min) (ACUTE ONLY): 34 min   Charges:   PT Evaluation $PT Eval Low Complexity: 1 Low PT Treatments $Gait Training: 8-22 mins   PT G Codes:        Leighton Ruff, PT, DPT  Acute Rehabilitation Services  Pager: [phone]   Rudean Hitt 09/13/2017, 2:52 PM

## 2017-09-13 NOTE — Anesthesia Postprocedure Evaluation (Signed)
Anesthesia Post Note  Patient: Jetta Murray  Procedure(s) Performed: RIGHT TOTAL KNEE ARTHROPLASTY (Right Knee)     Patient location during evaluation: PACU Anesthesia Type: Regional and Spinal Level of consciousness: oriented and awake and alert Pain management: pain level controlled Vital Signs Assessment: post-procedure vital signs reviewed and stable Respiratory status: spontaneous breathing, respiratory function stable and patient connected to nasal cannula oxygen Cardiovascular status: blood pressure returned to baseline and stable Postop Assessment: no headache, no backache and no apparent nausea or vomiting Anesthetic complications: no    Last Vitals:  Vitals:   09/13/17 1130 09/13/17 1145  BP:  (!) 102/50  Pulse: 64 63  Resp: 16 15  Temp:    SpO2: 96% 98%    Last Pain:  Vitals:   09/13/17 1151  TempSrc:   PainSc: 5                  Mar Walmer,JAMES TERRILL

## 2017-09-13 NOTE — Op Note (Signed)
NAMEBONNELL, PLACZEK NO.:  [id number]  MEDICAL RECORD NO.:  [id number]  LOCATION:  [facility]                         FACILITY:  [facility]  PHYSICIAN:  Lind Guest. Ninfa Linden, M.D.DATE OF BIRTH:  01-23-1956  DATE OF PROCEDURE:  09/13/2017 DATE OF DISCHARGE:                              OPERATIVE REPORT   PREOPERATIVE DIAGNOSIS:  Primary osteoarthritis and degenerative joint disease, right knee.  POSTOPERATIVE DIAGNOSIS:  Primary osteoarthritis and degenerative joint disease, right knee.  PROCEDURE:  Right total knee arthroplasty.  IMPLANTS:  Stryker triathlon press-fit knee system with size 2 press-fit femur, size 2 press-fit tibial tray, 11 mm fix-bearing polyethylene insert, and size 29 press-fit patellar button.  SURGEON:  Lind Guest. Ninfa Linden, MD.  ASSISTANT:  Erskine Emery, PA-C.  ANESTHESIA: 1. Right lower extremity adductor canal block. 2. Spinal.  TOURNIQUET TIME:  Under 1 hour.  ANTIBIOTICS:  IV antibiotics 2 g.  BLOOD LOSS:  Less than 100 mL.  COMPLICATIONS:  None.  INDICATIONS:  Ms. Meda Coffee is a very pleasant 61 year old female, well known to [place].  She has debilitating arthritis involving her right knee and has tried and failed multiple years of conservative treatment including multiple steroid and hyaluronic acid injections.  Her pain has become daily.  It has detrimentally affected her activities of daily living, her quality of life, her mobility.  She has significant swelling in her knee as well.  An MRI did confirm areas of full-thickness cartilage loss on the medial femoral condyle and the trochlea and patella of the knee.  Given these findings, we recommended a total knee arthroplasty given the detrimental effect this knee pain has had on her life.  She does wish to proceed with surgery.  She understands the risk of acute blood loss anemia, nerve and vessel injury, fracture, infection, and DVT.  She understands our goals are to  decrease pain, improve mobility, and overall improved quality of life.  PROCEDURE DESCRIPTION:  After informed consent was obtained, appropriate right knee was marked.  An adductor canal block was obtained of the right lower extremity in the holding room and then, she was brought to the operating room and sat up on the operating table, so spinal anesthesia could be obtained.  She was then laid in a supine position. A Foley catheter was placed and a nonsterile tourniquet was placed around her upper right leg.  Her right leg was then prepped and draped from the thigh down the toes with DuraPrep and sterile drapes.  A time- out was called.  She was identified as correct patient and correct right knee.  We then used an Esmarch to wrap out the leg and tourniquet was inflated to 300 mmHg.  I then made an incision over the patella and carried this proximally and distally.  I dissected down the knee joint and carried out a medial parapatellar arthrotomy finding a very large joint effusion and finding significant cartilage loss throughout her knee.  With the knee in a flexed position, we removed remnants of ACL, PCL, medial and lateral meniscus.  We then used our extramedullary cutting guide, correcting her varus and valgus and neutral slope taking 9 mm off the high side.  We made this cut without difficulty.  We then used the intramedullary cutting guide for making our distal femoral cut. We set this guide at 5 degrees externally rotated for right knee and took 8 mm off the distal femur.  We brought the knee back down to full extension with a 9 mm extension block and obtained full extension.  We then went back to the femur and put our femoral sizing guide based off the epicondylar axis for 3 degrees right and made our sizing based off the ties and size 2 femur based off this.  We put our 4-in-1 cutting block for a size 2 femur and made our anterior and posterior cuts, followed by our chamfer  cuts.  We then made our femoral box cut.  We then went back to the tibia and chose a size 2 tibial tray for coverage of the tibia setting our rotation off the tibial tubercle and the femur. We made our keel punch off this and with the size 2 femur and a size 2 tibial tray, we trialed a 9 mm and 11 mm fix-bearing polyethylene insert and I was pleased with stability of 11 mm insert.  We then made our patellar cut and drilled 3 holes for press-fit patellar button size 29. We then removed all instrumentation and her all trial components.  We irrigated the knee with normal saline solution.  We then placed our real Stryker triathlon press-fit components with a press-fit size 2 tibia, followed by the 2 femur.  We placed an 11 mm fix-bearing polyethylene insert and press fit out patellar button.  We were pleased with range of motion and stability after that and we let the tourniquet down and hemostasis was obtained with electrocautery.  We then irrigated the knee with normal saline solution using pulsatile lavage.  We closed our arthrotomy with interrupted #1 Vicryl suture, followed by 0 Vicryl in the deep tissue, 2-0 Vicryl in the subcutaneous tissue, 4-0 Monocryl in the subcuticular stitch, and Steri-Strips on the skin.  A well-padded sterile dressing was applied.  She was taken to the recovery room in stable condition.  All final counts were correct.  There were no complications noted.     Lind Guest. Ninfa Linden, M.D.     CYB/MEDQ  D:  09/13/2017  T:  09/13/2017  Job:  [id number]

## 2017-09-14 ENCOUNTER — Other Ambulatory Visit: Payer: Self-pay

## 2017-09-14 ENCOUNTER — Encounter (HOSPITAL_COMMUNITY): Payer: Self-pay | Admitting: General Practice

## 2017-09-14 LAB — BASIC METABOLIC PANEL
Anion gap: 10 (ref 5–15)
BUN: 11 mg/dL (ref 6–20)
CALCIUM: 8.8 mg/dL — AB (ref 8.9–10.3)
CHLORIDE: 102 mmol/L (ref 101–111)
CO2: 25 mmol/L (ref 22–32)
CREATININE: 0.75 mg/dL (ref 0.44–1.00)
GFR calc Af Amer: >60 mL/min (ref >60)
GFR calc non Af Amer: >60 mL/min (ref >60)
GLUCOSE: 140 mg/dL — AB (ref 65–99)
Potassium: 3.9 mmol/L (ref 3.5–5.1)
Sodium: 137 mmol/L (ref 135–145)

## 2017-09-14 LAB — CBC
HEMATOCRIT: 43.1 % (ref 36.0–46.0)
Hemoglobin: 14 g/dL (ref 12.0–15.0)
MCH: 27.6 pg (ref 26.0–34.0)
MCHC: 32.5 g/dL (ref 30.0–36.0)
MCV: 84.8 fL (ref 78.0–100.0)
Platelets: 245 10*3/uL (ref 150–400)
RBC: 5.08 MIL/uL (ref 3.87–5.11)
RDW: 13.9 % (ref 11.5–15.5)
WBC: 17.7 10*3/uL — AB (ref 4.0–10.5)

## 2017-09-14 MED ORDER — OXYCODONE HCL 5 MG PO TABS
10.0000 mg | ORAL_TABLET | ORAL | Status: DC | PRN
  Administered 2017-09-15: 15 mg via ORAL
  Administered 2017-09-15: 10 mg via ORAL
  Administered 2017-09-15: 15 mg via ORAL
  Administered 2017-09-15 (×2): 10 mg via ORAL
  Administered 2017-09-16 (×2): 15 mg via ORAL
  Filled 2017-09-14 (×2): qty 3
  Filled 2017-09-14 (×3): qty 2
  Filled 2017-09-14 (×2): qty 3

## 2017-09-14 MED ORDER — RIVAROXABAN 10 MG PO TABS
10.0000 mg | ORAL_TABLET | Freq: Every day | ORAL | Status: DC
  Administered 2017-09-14 – 2017-09-15 (×2): 10 mg via ORAL
  Filled 2017-09-14 (×2): qty 1

## 2017-09-14 MED ORDER — KETOROLAC TROMETHAMINE 15 MG/ML IJ SOLN
15.0000 mg | Freq: Once | INTRAMUSCULAR | Status: AC
  Administered 2017-09-14: 15 mg via INTRAVENOUS
  Filled 2017-09-14: qty 1

## 2017-09-14 NOTE — Progress Notes (Signed)
OT Cancellation Note  Patient Details Name: Aine Strycharz MRN: [id number] DOB: Jun 24, 1956   Cancelled Treatment:    Reason Eval/Treat Not Completed: Other (comment)(nausea). Pt reporting nausea, recently worked with PT. Nursing aware per pt. Will reattempt once pt feeling better as schedule permits.     Tyrone Schimke OTR/L Pager: [phone]  09/14/2017, 12:16 PM

## 2017-09-14 NOTE — Progress Notes (Signed)
Physical Therapy Treatment Patient Details Name: Mallory [name] MRN: [id number] DOB: [birth year] Today's Date: 09/14/2017    History of Present Illness Pt is a 61 y/o female s/p elective R TKA. PMH includes PE and GERD.     PT Comments    Patient is making gradual progress toward mobility goals. Limited by pain and nausea. Pt was able to ambulate 2ft with min guard assist and RW. Continue to progress as tolerated.    Follow Up Recommendations  DC plan and follow up therapy as arranged by surgeon;Supervision for mobility/OOB     Equipment Recommendations  Rolling walker with 5" wheels;3in1 (PT)    Recommendations for Other Services       Precautions / Restrictions Precautions Precautions: Knee Precaution Booklet Issued: Yes (comment) Precaution Comments: precautions/positioning reviewed with pt Restrictions Weight Bearing Restrictions: Yes RLE Weight Bearing: Weight bearing as tolerated    Mobility  Bed Mobility Overal bed mobility: Needs Assistance Bed Mobility: Supine to Sit     Supine to sit: Min assist     General bed mobility comments: assist to bring R LE to EOB; cues for technique  Transfers Overall transfer level: Needs assistance Equipment used: Rolling walker (2 wheeled) Transfers: Sit to/from Stand Sit to Stand: Min guard         General transfer comment: min guard for safety; good hand placement  Ambulation/Gait Ambulation/Gait assistance: Min guard Ambulation Distance (Feet): 20 Feet Assistive device: Rolling walker (2 wheeled) Gait Pattern/deviations: Step-to pattern;Decreased step length - left;Decreased weight shift to right;Antalgic;Decreased stance time - right;Decreased step length - right Gait velocity: Decreased   General Gait Details: cues for sequencing and increased L step length   Stairs            Wheelchair Mobility    Modified Rankin (Stroke Patients Only)       Balance Overall balance assessment: Needs  assistance Sitting-balance support: Feet supported;Bilateral upper extremity supported Sitting balance-Leahy Scale: Fair     Standing balance support: Bilateral upper extremity supported;During functional activity Standing balance-Leahy Scale: Poor Standing balance comment: Reliant on UE support.                             Cognition Arousal/Alertness: Awake/alert Behavior During Therapy: WFL for tasks assessed/performed Overall Cognitive Status: Within Functional Limits for tasks assessed                                        Exercises      General Comments        Pertinent Vitals/Pain Pain Assessment: Faces Faces Pain Scale: Hurts even more Pain Location: R knee  Pain Descriptors / Indicators: Grimacing;Guarding;Moaning;Sore Pain Intervention(s): Limited activity within patient's tolerance;Monitored during session;Premedicated before session;Repositioned;Ice applied    Home Living Family/patient expects to be discharged to:: Private residence Living Arrangements: Non-relatives/Friends                  Prior Function            PT Goals (current goals can now be found in the care plan section) Acute Rehab PT Goals Patient Stated Goal: to decrease pain  PT Goal Formulation: With patient Time For Goal Achievement: 09/20/17 Potential to Achieve Goals: Good Progress towards PT goals: Progressing toward goals    Frequency    7X/week      PT  Plan Current plan remains appropriate    Co-evaluation              AM-PAC PT "6 Clicks" Daily Activity  Outcome Measure  Difficulty turning over in bed (including adjusting bedclothes, sheets and blankets)?: A Lot Difficulty moving from lying on back to sitting on the side of the bed? : Unable Difficulty sitting down on and standing up from a chair with arms (e.g., wheelchair, bedside commode, etc,.)?: Unable Help needed moving to and from a bed to chair (including a  wheelchair)?: A Little Help needed walking in hospital room?: A Little Help needed climbing 3-5 steps with a railing? : Total 6 Click Score: 11    End of Session Equipment Utilized During Treatment: Gait belt Activity Tolerance: Patient limited by pain;Other (comment)(limited by nausea) Patient left: in chair;with call bell/phone within reach Nurse Communication: Mobility status PT Visit Diagnosis: Other abnormalities of gait and mobility (R26.89);Pain Pain - Right/Left: Right Pain - part of body: Knee     Time: 3335-4562 PT Time Calculation (min) (ACUTE ONLY): 25 min  Charges:  $Gait Training: 8-22 mins $Therapeutic Activity: 8-22 mins                    G Codes:       Earney Navy, PTA Pager: [phone]     Darliss Cheney 09/14/2017, 11:54 AM

## 2017-09-14 NOTE — Progress Notes (Signed)
Subjective: 1 Day Post-Op Procedure(s) (LRB): RIGHT TOTAL KNEE ARTHROPLASTY (Right) Patient reports pain as moderate.    Objective: Vital signs in last 24 hours: Temp:  [97 F (36.1 C)-98.6 F (37 C)] 98 F (36.7 C) (12/06 0452) Pulse Rate:  [58-87] 77 (12/06 0452) Resp:  [12-20] 16 (12/06 0452) BP: (99-130)/(50-94) 123/57 (12/06 0452) SpO2:  [92 %-100 %] 92 % (12/06 0452)  Intake/Output from previous day: 12/05 0701 - 12/06 0700 In: 2150.7 [P.O.:480; I.V.:1520.7; IV Piggyback:150] Out: 1025 [Urine:950; Blood:75] Intake/Output this shift: No intake/output data recorded.  Recent Labs    09/14/17 0508  HGB 14.0   Recent Labs    09/14/17 0508  WBC 17.7*  RBC 5.08  HCT 43.1  PLT 245   No results for input(s): NA, K, CL, CO2, BUN, CREATININE, GLUCOSE, CALCIUM in the last 72 hours. No results for input(s): LABPT, INR in the last 72 hours.  Sensation intact distally Intact pulses distally Dorsiflexion/Plantar flexion intact Incision: dressing C/D/I No cellulitis present Compartment soft  Assessment/Plan: 1 Day Post-Op Procedure(s) (LRB): RIGHT TOTAL KNEE ARTHROPLASTY (Right) Up with therapy Plan for discharge tomorrow Discharge home with home health  Mallory [name] 09/14/2017, 7:10 AM

## 2017-09-14 NOTE — Progress Notes (Signed)
Orthopedic Tech Progress Note Patient Details:  Mallory [name] 1956-03-25 [id number]  CPM Right Knee CPM Right Knee: On Right Knee Flexion (Degrees): 90 Right Knee Extension (Degrees): 0  Post Interventions Patient Tolerated: Well Instructions Provided: Care of device  [name] 09/14/2017, 3:12 PM

## 2017-09-14 NOTE — Care Management Note (Signed)
Case Management Note  Patient Details  Name: Mallory [name] MRN: [id number] Date of Birth: 07-19-1956  Subjective/Objective:  61 yr old female s/p right total knee arthroplasty.                  Action/Plan:  Case manager spoke with patient concerning discharge plan and DME. Patient was preoperatively setup with [facility], no changes.    Expected Discharge Date:   09/15/17               Expected Discharge Plan:  [name]  In-House Referral:     Discharge planning Services  CM Consult  Post Acute Care Choice:  Durable Medical Equipment, Home Health Choice offered to:  Patient  DME Arranged:  3-N-1, Walker rolling DME Agency:  [place]:  PT [place]:  [facility] ([facility])  Status of Service:  Completed, signed off  If discussed at [employer], dates discussed:    Additional Comments:  Ninfa Meeker, RN 09/14/2017, 11:21 AM

## 2017-09-14 NOTE — Progress Notes (Signed)
Physical Therapy Treatment Patient Details Name: Mallory [name] MRN: [id number] DOB: Aug 12, 1956 Today's Date: 09/14/2017    History of Present Illness Pt is a 61 y/o female s/p elective R TKA. PMH includes PE and GERD.     PT Comments    Patient tolerated increased gait distance and some R LE therex this session. Pt continues to be limited by R LE pain and heaviness. Continue to progress as tolerated.    Follow Up Recommendations  DC plan and follow up therapy as arranged by surgeon;Supervision for mobility/OOB     Equipment Recommendations  Rolling walker with 5" wheels;3in1 (PT)    Recommendations for Other Services       Precautions / Restrictions Precautions Precautions: Knee Precaution Booklet Issued: Yes (comment) Precaution Comments: precautions/positioning reviewed with pt Restrictions Weight Bearing Restrictions: Yes RLE Weight Bearing: Weight bearing as tolerated    Mobility  Bed Mobility Overal bed mobility: Needs Assistance Bed Mobility: Sit to Supine       Sit to supine: Min assist   General bed mobility comments: assist to bring R LE into bed; cues for technique  Transfers Overall transfer level: Needs assistance Equipment used: Rolling walker (2 wheeled) Transfers: Sit to/from Stand Sit to Stand: Min guard         General transfer comment: min guard for safety; good hand placement  Ambulation/Gait Ambulation/Gait assistance: Min guard Ambulation Distance (Feet): 80 Feet Assistive device: Rolling walker (2 wheeled) Gait Pattern/deviations: Step-to pattern;Decreased step length - left;Decreased weight shift to right;Antalgic;Decreased stance time - right;Decreased step length - right Gait velocity: Decreased   General Gait Details: cues for posture, sequencing, and R heel strike; pt beginning to increase weight bearing on R LE and increase L step length   Stairs            Wheelchair Mobility    Modified Rankin (Stroke Patients  Only)       Balance Overall balance assessment: Needs assistance Sitting-balance support: Feet supported;Bilateral upper extremity supported Sitting balance-Leahy Scale: Fair     Standing balance support: Bilateral upper extremity supported;During functional activity Standing balance-Leahy Scale: Poor Standing balance comment: Reliant on UE support.                             Cognition Arousal/Alertness: Awake/alert Behavior During Therapy: WFL for tasks assessed/performed Overall Cognitive Status: Within Functional Limits for tasks assessed                                        Exercises Total Joint Exercises Quad Sets: AROM;Right;10 reps Short Arc QuadSinclair [name];Right;5 reps Heel Slides: AAROM;Right;10 reps    General Comments        Pertinent Vitals/Pain Pain Assessment: Faces Faces Pain Scale: Hurts even more Pain Location: R knee  Pain Descriptors / Indicators: Grimacing;Guarding;Sore Pain Intervention(s): Limited activity within patient's tolerance;Monitored during session;Premedicated before session;Repositioned    Home Living                      Prior Function            PT Goals (current goals can now be found in the care plan section) Acute Rehab PT Goals Patient Stated Goal: to decrease pain  PT Goal Formulation: With patient Time For Goal Achievement: 09/20/17 Potential to Achieve Goals: Good Progress towards PT  goals: Progressing toward goals    Frequency    7X/week      PT Plan Current plan remains appropriate    Co-evaluation              AM-PAC PT "6 Clicks" Daily Activity  Outcome Measure  Difficulty turning over in bed (including adjusting bedclothes, sheets and blankets)?: A Lot Difficulty moving from lying on back to sitting on the side of the bed? : Unable Difficulty sitting down on and standing up from a chair with arms (e.g., wheelchair, bedside commode, etc,.)?: Unable Help needed  moving to and from a bed to chair (including a wheelchair)?: A Little Help needed walking in hospital room?: A Little Help needed climbing 3-5 steps with a railing? : Total 6 Click Score: 11    End of Session Equipment Utilized During Treatment: Gait belt;Right knee immobilizer Activity Tolerance: Patient limited by pain Patient left: with call bell/phone within reach;in bed;with family/visitor present Nurse Communication: Mobility status PT Visit Diagnosis: Other abnormalities of gait and mobility (R26.89);Pain Pain - Right/Left: Right Pain - part of body: Knee     Time: 0355-9741 PT Time Calculation (min) (ACUTE ONLY): 35 min  Charges:  $Gait Training: 8-22 mins $Therapeutic Exercise: 8-22 mins                    G Codes:       Earney Navy, PTA Pager: [phone]     Darliss Cheney 09/14/2017, 4:39 PM

## 2017-09-15 MED ORDER — METHOCARBAMOL 500 MG PO TABS: 500.0000 mg | ORAL_TABLET | Freq: Four times a day (QID) | ORAL | 0 refills | Status: DC | PRN

## 2017-09-15 MED ORDER — RIVAROXABAN 10 MG PO TABS: 10.0000 mg | ORAL_TABLET | Freq: Every day | ORAL | 0 refills | Status: DC

## 2017-09-15 MED ORDER — OXYCODONE-ACETAMINOPHEN 5-325 MG PO TABS: 1.0000 | ORAL_TABLET | ORAL | 0 refills | Status: DC | PRN

## 2017-09-15 NOTE — Progress Notes (Signed)
Physical Therapy Treatment Patient Details Name: Mallory [name] MRN: [id number] DOB: July 09, 1956 Today's Date: 09/15/2017    History of Present Illness Pt is a 61 y/o female s/p elective R TKA. PMH includes PE and GERD.     PT Comments    Patient received in bed in CPM, pleasant and willing to participate in skilled PT services, reporting pain 5/10 however. She continues to require Min assist for bed mobility to support R LE, able to perform all other functional mobility with min guard however. Ambulated to bathroom, patient able to complete toileting with distant S. Also performed gait training in hallway today, noting improving gait mechanics and speed this morning, but limited by fatigue. Knee AROM 9-60 degrees. Patient left in chair with all questions/concerns addressed and all needs met this morning.     Follow Up Recommendations  DC plan and follow up therapy as arranged by surgeon;Supervision for mobility/OOB     Equipment Recommendations  Rolling walker with 5" wheels;3in1 (PT)    Recommendations for Other Services       Precautions / Restrictions Precautions Precautions: Knee Precaution Booklet Issued: Yes (comment) Precaution Comments: precautions/positioning reviewed with pt Restrictions Weight Bearing Restrictions: Yes RLE Weight Bearing: Weight bearing as tolerated    Mobility  Bed Mobility Overal bed mobility: Needs Assistance Bed Mobility: Supine to Sit     Supine to sit: Min assist     General bed mobility comments: supporting R knee  Transfers Overall transfer level: Needs assistance Equipment used: Rolling walker (2 wheeled) Transfers: Sit to/from Stand Sit to Stand: Min guard         General transfer comment: min guard for safety; cues for hand placement   Ambulation/Gait Ambulation/Gait assistance: Min guard Ambulation Distance (Feet): 60 Feet Assistive device: Rolling walker (2 wheeled) Gait Pattern/deviations: Step-to pattern;Decreased  step length - left;Decreased weight shift to right;Antalgic;Decreased stance time - right;Decreased step length - right     General Gait Details: cues for posture, sequencing, and R heel strike; pt beginning to increase weight bearing on R LE and increase L step length   Stairs            Wheelchair Mobility    Modified Rankin (Stroke Patients Only)       Balance Overall balance assessment: Needs assistance Sitting-balance support: Feet supported;Bilateral upper extremity supported Sitting balance-Leahy Scale: Good     Standing balance support: Bilateral upper extremity supported;During functional activity Standing balance-Leahy Scale: Fair                              Cognition Arousal/Alertness: Awake/alert Behavior During Therapy: WFL for tasks assessed/performed Overall Cognitive Status: Within Functional Limits for tasks assessed                                        Exercises      General Comments        Pertinent Vitals/Pain Pain Assessment: 0-10 Pain Score: 5  Pain Location: R knee  Pain Descriptors / Indicators: Grimacing;Guarding;Sore Pain Intervention(s): Limited activity within patient's tolerance;Monitored during session    Home Living                      Prior Function            PT Goals (current goals can now be found  in the care plan section) Acute Rehab PT Goals Patient Stated Goal: to decrease pain  PT Goal Formulation: With patient Time For Goal Achievement: 09/20/17 Potential to Achieve Goals: Good Progress towards PT goals: Progressing toward goals    Frequency    7X/week      PT Plan Current plan remains appropriate    Co-evaluation              AM-PAC PT "6 Clicks" Daily Activity  Outcome Measure  Difficulty turning over in bed (including adjusting bedclothes, sheets and blankets)?: Unable Difficulty moving from lying on back to sitting on the side of the bed? :  Unable Difficulty sitting down on and standing up from a chair with arms (e.g., wheelchair, bedside commode, etc,.)?: Unable Help needed moving to and from a bed to chair (including a wheelchair)?: A Little Help needed walking in hospital room?: A Little Help needed climbing 3-5 steps with a railing? : A Lot 6 Click Score: 11    End of Session Equipment Utilized During Treatment: Gait belt;Right knee immobilizer Activity Tolerance: Patient limited by fatigue Patient left: in chair;with call bell/phone within reach   PT Visit Diagnosis: Other abnormalities of gait and mobility (R26.89);Pain Pain - Right/Left: Right Pain - part of body: Knee     Time: 8978-4784 PT Time Calculation (min) (ACUTE ONLY): 23 min  Charges:  $Gait Training: 8-22 mins $Therapeutic Activity: 8-22 mins                    G Codes:       Deniece Ree PT, DPT, CBIS  Supplemental Physical Therapist [place]

## 2017-09-15 NOTE — Discharge Instructions (Addendum)
INSTRUCTIONS AFTER JOINT REPLACEMENT  ° °o Remove items at home which could result in a fall. This includes throw rugs or furniture in walking pathways °o ICE to the affected joint every three hours while awake for 30 minutes at a time, for at least the first 3-5 days, and then as needed for pain and swelling.  Continue to use ice for pain and swelling. You may notice swelling that will progress down to the foot and ankle.  This is normal after surgery.  Elevate your leg when you are not up walking on it.   °o Continue to use the breathing machine you got in the hospital (incentive spirometer) which will help keep your temperature down.  It is common for your temperature to cycle up and down following surgery, especially at night when you are not up moving around and exerting yourself.  The breathing machine keeps your lungs expanded and your temperature down. ° ° °DIET:  As you were doing prior to hospitalization, we recommend a well-balanced diet. ° °DRESSING / WOUND CARE / SHOWERING ° °Keep the surgical dressing until follow up.  The dressing is water proof, so you can shower without any extra covering.  IF THE DRESSING FALLS OFF or the wound gets wet inside, change the dressing with sterile gauze.  Please use good hand washing techniques before changing the dressing.  Do not use any lotions or creams on the incision until instructed by your surgeon.   ° °ACTIVITY ° °o Increase activity slowly as tolerated, but follow the weight bearing instructions below.   °o No driving for 6 weeks or until further direction given by your physician.  You cannot drive while taking narcotics.  °o No lifting or carrying greater than 10 lbs. until further directed by your surgeon. °o Avoid periods of inactivity such as sitting longer than an hour when not asleep. This helps prevent blood clots.  °o You may return to work once you are authorized by your doctor.  ° ° ° °WEIGHT BEARING  ° °Weight bearing as tolerated with assist  device (walker, cane, etc) as directed, use it as long as suggested by your surgeon or therapist, typically at least 4-6 weeks. ° ° °EXERCISES ° °Results after joint replacement surgery are often greatly improved when you follow the exercise, range of motion and muscle strengthening exercises prescribed by your doctor. Safety measures are also important to protect the joint from further injury. Any time any of these exercises cause you to have increased pain or swelling, decrease what you are doing until you are comfortable again and then slowly increase them. If you have problems or questions, call your caregiver or physical therapist for advice.  ° °Rehabilitation is important following a joint replacement. After just a few days of immobilization, the muscles of the leg can become weakened and shrink (atrophy).  These exercises are designed to build up the tone and strength of the thigh and leg muscles and to improve motion. Often times heat used for twenty to thirty minutes before working out will loosen up your tissues and help with improving the range of motion but do not use heat for the first two weeks following surgery (sometimes heat can increase post-operative swelling).  ° °These exercises can be done on a training (exercise) mat, on the floor, on a table or on a bed. Use whatever works the best and is most comfortable for you.    Use music or television while you are exercising so that   the exercises are a pleasant break in your day. This will make your life better with the exercises acting as a break in your routine that you can look forward to.   Perform all exercises about fifteen times, three times per day or as directed.  You should exercise both the operative leg and the other leg as well. ° °Exercises include: °  °• Quad Sets - Tighten up the muscle on the front of the thigh (Quad) and hold for 5-10 seconds.   °• Straight Leg Raises - With your knee straight (if you were given a brace, keep it on),  lift the leg to 60 degrees, hold for 3 seconds, and slowly lower the leg.  Perform this exercise against resistance later as your leg gets stronger.  °• Leg Slides: Lying on your back, slowly slide your foot toward your buttocks, bending your knee up off the floor (only go as far as is comfortable). Then slowly slide your foot back down until your leg is flat on the floor again.  °• Angel Wings: Lying on your back spread your legs to the side as far apart as you can without causing discomfort.  °• Hamstring Strength:  Lying on your back, push your heel against the floor with your leg straight by tightening up the muscles of your buttocks.  Repeat, but this time bend your knee to a comfortable angle, and push your heel against the floor.  You may put a pillow under the heel to make it more comfortable if necessary.  ° °A rehabilitation program following joint replacement surgery can speed recovery and prevent re-injury in the future due to weakened muscles. Contact your doctor or a physical therapist for more information on knee rehabilitation.  ° ° °CONSTIPATION ° °Constipation is defined medically as fewer than three stools per week and severe constipation as less than one stool per week.  Even if you have a regular bowel pattern at home, your normal regimen is likely to be disrupted due to multiple reasons following surgery.  Combination of anesthesia, postoperative narcotics, change in appetite and fluid intake all can affect your bowels.  ° °YOU MUST use at least one of the following options; they are listed in order of increasing strength to get the job done.  They are all available over the counter, and you may need to use some, POSSIBLY even all of these options:   ° °Drink plenty of fluids (prune juice may be helpful) and high fiber foods °Colace 100 mg by mouth twice a day  °Senokot for constipation as directed and as needed Dulcolax (bisacodyl), take with full glass of water  °Miralax (polyethylene glycol)  once or twice a day as needed. ° °If you have tried all these things and are unable to have a bowel movement in the first 3-4 days after surgery call either your surgeon or your primary doctor.   ° °If you experience loose stools or diarrhea, hold the medications until you stool forms back up.  If your symptoms do not get better within 1 week or if they get worse, check with your doctor.  If you experience "the worst abdominal pain ever" or develop nausea or vomiting, please contact the office immediately for further recommendations for treatment. ° ° °ITCHING:  If you experience itching with your medications, try taking only a single pain pill, or even half a pain pill at a time.  You can also use Benadryl over the counter for itching or also to   help with sleep.   TED HOSE STOCKINGS:  Use stockings on both legs until for at least 2 weeks or as directed by physician office. They may be removed at night for sleeping.  MEDICATIONS:  See your medication summary on the After Visit Summary that nursing will review with you.  You may have some home medications which will be placed on hold until you complete the course of blood thinner medication.  It is important for you to complete the blood thinner medication as prescribed.  PRECAUTIONS:  If you experience chest pain or shortness of breath - call 911 immediately for transfer to the hospital emergency department.   If you develop a fever greater that 101 F, purulent drainage from wound, increased redness or drainage from wound, foul odor from the wound/dressing, or calf pain - CONTACT YOUR SURGEON.                                                   FOLLOW-UP APPOINTMENTS:  If you do not already have a post-op appointment, please call the office for an appointment to be seen by your surgeon.  Guidelines for how soon to be seen are listed in your After Visit Summary, but are typically between 1-4 weeks after surgery.  OTHER INSTRUCTIONS:   Knee  Replacement:  Do not place pillow under knee, focus on keeping the knee straight while resting. CPM instructions: 0-90 degrees, 2 hours in the morning, 2 hours in the afternoon, and 2 hours in the evening. Place foam block, curve side up under heel at all times except when in CPM or when walking.  DO NOT modify, tear, cut, or change the foam block in any way.  MAKE SURE YOU:   Understand these instructions.   Get help right away if you are not doing well or get worse.    Thank you for letting us be a part of your medical care team.  It is a privilege we respect greatly.  We hope these instructions will help you stay on track for a fast and full recovery!    Information on my medicine - XARELTO (Rivaroxaban)  This medication education was reviewed with me or my healthcare representative as part of my discharge preparation.  The pharmacist that spoke with me during my hospital stay was:  [facility], Margot Chimes, [facility]  Why was Xarelto prescribed for you? Xarelto was prescribed for you to reduce the risk of blood clots forming after orthopedic surgery. The medical term for these abnormal blood clots is venous thromboembolism (VTE).  What do you need to know about xarelto ? Take your Xarelto ONCE DAILY at the same time every day. You may take it either with or without food.  If you have difficulty swallowing the tablet whole, you may crush it and mix in applesauce just prior to taking your dose.  Take Xarelto exactly as prescribed by your doctor and DO NOT stop taking Xarelto without talking to the doctor who prescribed the medication.  Stopping without other VTE prevention medication to take the place of Xarelto may increase your risk of developing a clot.  After discharge, you should have regular check-up appointments with your healthcare provider that is prescribing your Xarelto.    What do you do if you miss a dose? If you miss a dose, take it as soon as  you remember on the  same day then continue your regularly scheduled once daily regimen the next day. Do not take two doses of Xarelto on the same day.   Important Safety Information A possible side effect of Xarelto is bleeding. You should call your healthcare provider right away if you experience any of the following: ? Bleeding from an injury or your nose that does not stop. ? Unusual colored urine (red or dark brown) or unusual colored stools (red or black). ? Unusual bruising for unknown reasons. ? A serious fall or if you hit your head (even if there is no bleeding).  Some medicines may interact with Xarelto and might increase your risk of bleeding while on Xarelto. To help avoid this, consult your healthcare provider or pharmacist prior to using any new prescription or non-prescription medications, including herbals, vitamins, non-steroidal anti-inflammatory drugs (NSAIDs) and supplements.  This website has more information on Xarelto: [email or web address]

## 2017-09-15 NOTE — Evaluation (Signed)
Occupational Therapy Evaluation and discharge Patient Details Name: Mallory [name] MRN: [id number] DOB: 1956-03-21 Today's Date: 09/15/2017    History of Present Illness Pt is a 61 y/o female s/p elective R TKA. PMH includes PE and GERD.    Clinical Impression   This 61 yo female admitted and underwent above presents to acute OT with all education completed, we will D/C from acute OT.    Follow Up Recommendations  No OT follow up;Supervision/Assistance - 24 hour    Equipment Recommendations  3 in 1 bedside commode(already delieverd)       Precautions / Restrictions Precautions Precautions: Knee Restrictions Weight Bearing Restrictions: Yes RLE Weight Bearing: Weight bearing as tolerated             ADL either performed or assessed with clinical judgement   ADL Overall ADL's : Needs assistance/impaired Eating/Feeding: Independent;Sitting   Grooming: Set up;Sitting   Upper Body Bathing: Set up;Sitting   Lower Body Bathing: Moderate assistance   Upper Body Dressing : Set up;Sitting   Lower Body Dressing: Moderate assistance     Toilet Transfer Details (indicate cue type and reason): Pt reports she has been getting up to 3n1 in bathroom with staff, backing up to it and using hands for sit<>stand       Tub/Shower Transfer Details (indicate cue type and reason): Pt has great set up for showering (walk in shower with hand held shower head and tub bench and multiple grab bars). We discussed that she may either have to back into shower stall with RW or go sideways depending on which way is easier for her.   General ADL Comments: Educated pt and boyfriend on most efficient sequence of getting dressed.     Vision Patient Visual Report: No change from baseline              Pertinent Vitals/Pain Pain Assessment: 0-10 Pain Score: 5  Pain Location: R knee  Pain Descriptors / Indicators: Grimacing;Guarding;Sore Pain Intervention(s): Monitored during session      Hand Dominance Right   Extremity/Trunk Assessment Upper Extremity Assessment Upper Extremity Assessment: Overall WFL for tasks assessed           Communication Communication Communication: No difficulties   Cognition Arousal/Alertness: Awake/alert Behavior During Therapy: WFL for tasks assessed/performed Overall Cognitive Status: Within Functional Limits for tasks assessed                                                Home Living Family/patient expects to be discharged to:: Private residence Living Arrangements: Non-relatives/Friends Available Help at Discharge: Family;Available 24 hours/day Type of Home: House Home Access: Stairs to enter [employer] of Steps: 2 Entrance Stairs-Rails: Left Home Layout: One level     Bathroom Shower/Tub: Occupational psychologist: Standard     Home Equipment: Environmental consultant - 4 wheels;Walker - standard;Shower seat - built in          Prior Functioning/Environment Level of Independence: Independent                 OT Problem List: Decreased range of motion;Pain         OT Goals(Current goals can be found in the care plan section) Acute Rehab OT Goals Patient Stated Goal: to decrease pain   OT Frequency:  AM-PAC PT "6 Clicks" Daily Activity     Outcome Measure Help from another person eating meals?: None Help from another person taking care of personal grooming?: A Little Help from another person toileting, which includes using toliet, bedpan, or urinal?: A Little Help from another person bathing (including washing, rinsing, drying)?: A Lot Help from another person to put on and taking off regular upper body clothing?: A Little Help from another person to put on and taking off regular lower body clothing?: A Lot 6 Click Score: 17   End of Session  Activity Tolerance: Patient tolerated treatment well(to have pain meds in about 30 minutes) Patient left: in  chair;with call bell/phone within reach;with family/visitor present  OT Visit Diagnosis: Pain;Muscle weakness (generalized) (M62.81) Pain - Right/Left: Right Pain - part of body: Knee                Time: 0737-1062 OT Time Calculation (min): 8 min Charges:  OT General Charges $OT Visit: 1 Visit OT Evaluation $OT Eval Low Complexity: 1 Low [place], OTR/L [phone] 09/15/2017

## 2017-09-15 NOTE — Progress Notes (Signed)
Physical Therapy Treatment Patient Details Name: Mallory [name] MRN: [id number] DOB: October 26, 1955 Today's Date: 09/15/2017    History of Present Illness Pt is a 61 y/o female s/p elective R TKA. PMH includes PE and GERD.     PT Comments    Patient received this afternoon doing well, pleasant and ready to participate in PT. Began with ambulation to bathroom, patient able to perform with distant S, and then proceeded with gait training in hallway approximately 173f today with general S and continuing to be limited by fatigue but gait mechanics and tolerance overall improving. Performed functional ROM and strengthening exercises in the bed today as well, with Min assist from PT for more challenging exercises. Patient left in bed with all questions/concerns addressed and needs met.     Follow Up Recommendations  DC plan and follow up therapy as arranged by surgeon;Supervision for mobility/OOB     Equipment Recommendations  Rolling walker with 5" wheels;3in1 (PT)    Recommendations for Other Services       Precautions / Restrictions Precautions Precautions: Knee Precaution Booklet Issued: Yes (comment) Precaution Comments: precautions/positioning reviewed with pt Restrictions Weight Bearing Restrictions: Yes RLE Weight Bearing: Weight bearing as tolerated    Mobility  Bed Mobility Overal bed mobility: Needs Assistance Bed Mobility: Sit to Supine       Sit to supine: Min assist   General bed mobility comments: supporting R knee  Transfers Overall transfer level: Needs assistance Equipment used: Rolling walker (2 wheeled) Transfers: Sit to/from Stand Sit to Stand: Supervision            Ambulation/Gait Ambulation/Gait assistance: Supervision Ambulation Distance (Feet): 100 Feet Assistive device: Rolling walker (2 wheeled) Gait Pattern/deviations: Step-to pattern;Decreased step length - left;Decreased weight shift to right;Antalgic;Decreased stance time -  right;Decreased step length - right     General Gait Details: improved weight bearing R LE, improved gait tolerance    Stairs            Wheelchair Mobility    Modified Rankin (Stroke Patients Only)       Balance Overall balance assessment: Needs assistance Sitting-balance support: Bilateral upper extremity supported;Feet supported Sitting balance-Leahy Scale: Good     Standing balance support: Bilateral upper extremity supported Standing balance-Leahy Scale: Good                              Cognition Arousal/Alertness: Awake/alert Behavior During Therapy: WFL for tasks assessed/performed Overall Cognitive Status: Within Functional Limits for tasks assessed                                        Exercises Total Joint Exercises Quad Sets: Right;Strengthening;10 reps Heel Slides: AAROM;Right;10 reps Straight Leg Raises: Strengthening;Right;10 reps    General Comments        Pertinent Vitals/Pain Pain Assessment: 0-10 Pain Score: 2  Pain Location: R knee  Pain Descriptors / Indicators: Grimacing;Guarding;Sore Pain Intervention(s): Limited activity within patient's tolerance;Monitored during session    [place] Private residence Living Arrangements: Non-relatives/Friends Available Help at Discharge: Family;Available 24 hours/day Type of Home: House Home Access: Stairs to enter Entrance Stairs-Rails: Left Home Layout: One level Home Equipment: Walker - 4 wheels;Walker - standard;Shower seat - built in      Prior Function Level of Independence: Independent  PT Goals (current goals can now be found in the care plan section) Acute Rehab PT Goals Patient Stated Goal: to decrease pain  PT Goal Formulation: With patient Time For Goal Achievement: 09/20/17 Potential to Achieve Goals: Good Progress towards PT goals: Progressing toward goals    Frequency     7X/week      PT Plan Current plan remains appropriate    Co-evaluation              AM-PAC PT "6 Clicks" Daily Activity  Outcome Measure  Difficulty turning over in bed (including adjusting bedclothes, sheets and blankets)?: Unable Difficulty moving from lying on back to sitting on the side of the bed? : Unable Difficulty sitting down on and standing up from a chair with arms (e.g., wheelchair, bedside commode, etc,.)?: Unable Help needed moving to and from a bed to chair (including a wheelchair)?: A Little Help needed walking in hospital room?: A Little Help needed climbing 3-5 steps with a railing? : A Lot 6 Click Score: 11    End of Session Equipment Utilized During Treatment: Gait belt;Right knee immobilizer Activity Tolerance: Patient tolerated treatment well Patient left: in bed;with call bell/phone within reach   PT Visit Diagnosis: Other abnormalities of gait and mobility (R26.89);Pain Pain - Right/Left: Right Pain - part of body: Knee     Time: 1330-1355 PT Time Calculation (min) (ACUTE ONLY): 25 min  Charges:  $Gait Training: 8-22 mins $Therapeutic Exercise: 8-22 mins                    G Codes:       Deniece Ree PT, DPT, CBIS  Supplemental Physical Therapist [place]

## 2017-09-15 NOTE — Progress Notes (Signed)
Subjective: 2 Days Post-Op Procedure(s) (LRB): RIGHT TOTAL KNEE ARTHROPLASTY (Right) Patient reports pain as moderate.    Objective: Vital signs in last 24 hours: Temp:  [97.9 F (36.6 C)-98.4 F (36.9 C)] 98.4 F (36.9 C) (12/07 1400) Pulse Rate:  [78-89] 83 (12/07 1400) Resp:  [16-18] 18 (12/07 1400) BP: (117-135)/(55-69) 128/69 (12/07 1400) SpO2:  [91 %] 91 % (12/07 1400) Weight:  [182 lb (82.6 kg)] 182 lb (82.6 kg) (12/07 1400)  Intake/Output from previous day: 12/06 0701 - 12/07 0700 In: 660 [P.O.:660] Out: 250 [Urine:250] Intake/Output this shift: Total I/O In: 360 [P.O.:360] Out: -   Recent Labs    09/14/17 0508  HGB 14.0   Recent Labs    09/14/17 0508  WBC 17.7*  RBC 5.08  HCT 43.1  PLT 245   Recent Labs    09/14/17 0508  NA 137  K 3.9  CL 102  CO2 25  BUN 11  CREATININE 0.75  GLUCOSE 140*  CALCIUM 8.8*   No results for input(s): LABPT, INR in the last 72 hours.  Sensation intact distally Intact pulses distally Dorsiflexion/Plantar flexion intact Incision: dressing C/D/I No cellulitis present Compartment soft  Assessment/Plan: 2 Days Post-Op Procedure(s) (LRB): RIGHT TOTAL KNEE ARTHROPLASTY (Right) Up with therapy Plan for discharge tomorrow Discharge home with home health  Mcarthur Rossetti 09/15/2017, 3:47 PM

## 2017-09-16 NOTE — Progress Notes (Signed)
Discharge Instructions    Call MD / Call 911   Complete by:  As directed    If you experience chest pain or shortness of breath, CALL 911 and be transported to the hospital emergency room.  If you develope a fever above 101 F, pus (white drainage) or increased drainage or redness at the wound, or calf pain, call your surgeon's office.   Constipation Prevention   Complete by:  As directed    Drink plenty of fluids.  Prune juice may be helpful.  You may use a stool softener, such as Colace (over the counter) 100 mg twice a day.  Use MiraLax (over the counter) for constipation as needed.   Diet - low sodium heart healthy   Complete by:  As directed    Discharge instructions   Complete by:  As directed    Keep knee incision dry for 5 days post op then may wet while bathing. Therapy daily and CPM goal full extension and greater than 90 degrees flexion. Call if fever or chills or increased drainage. Go to ER if acutely short of breath or call for ambulance. Return for follow up in 2 weeks. May full weight bear on the surgical leg unless told otherwise. Use knee immobilizer until able to straight leg raise off bed with knee stable. In house walking for first 2 weeks. INSTRUCTIONS AFTER JOINT REPLACEMENT   Remove items at home which could result in a fall. This includes throw rugs or furniture in walking pathways ICE to the affected joint every three hours while awake for 30 minutes at a time, for at least the first 3-5 days, and then as needed for pain and swelling.  Continue to use ice for pain and swelling. You may notice swelling that will progress down to the foot and ankle.  This is normal after surgery.  Elevate your leg when you are not up walking on it.   Continue to use the breathing machine you got in the hospital (incentive spirometer) which will help keep your temperature down.  It is common for your temperature to cycle up and down following surgery, especially at night when you are not  up moving around and exerting yourself.  The breathing machine keeps your lungs expanded and your temperature down.   DIET:  As you were doing prior to hospitalization, we recommend a well-balanced diet.  DRESSING / WOUND CARE / SHOWERING  Keep the surgical dressing until follow up.  The dressing is water proof, so you can shower without any extra covering.  IF THE DRESSING FALLS OFF or the wound gets wet inside, change the dressing with sterile gauze.  Please use good hand washing techniques before changing the dressing.  Do not use any lotions or creams on the incision until instructed by your surgeon.    ACTIVITY  Increase activity slowly as tolerated, but follow the weight bearing instructions below.   No driving for 6 weeks or until further direction given by your physician.  You cannot drive while taking narcotics.  No lifting or carrying greater than 10 lbs. until further directed by your surgeon. Avoid periods of inactivity such as sitting longer than an hour when not asleep. This helps prevent blood clots.  You may return to work once you are authorized by your doctor.     WEIGHT BEARING   Weight bearing as tolerated with assist device (walker, cane, etc) as directed, use it as long as suggested by your surgeon or therapist, typically  at least 4-6 weeks.   EXERCISES  Results after joint replacement surgery are often greatly improved when you follow the exercise, range of motion and muscle strengthening exercises prescribed by your doctor. Safety measures are also important to protect the joint from further injury. Any time any of these exercises cause you to have increased pain or swelling, decrease what you are doing until you are comfortable again and then slowly increase them. If you have problems or questions, call your caregiver or physical therapist for advice.   Rehabilitation is important following a joint replacement. After just a few days of immobilization, the muscles  of the leg can become weakened and shrink (atrophy).  These exercises are designed to build up the tone and strength of the thigh and leg muscles and to improve motion. Often times heat used for twenty to thirty minutes before working out will loosen up your tissues and help with improving the range of motion but do not use heat for the first two weeks following surgery (sometimes heat can increase post-operative swelling).   These exercises can be done on a training (exercise) mat, on the floor, on a table or on a bed. Use whatever works the best and is most comfortable for you.    Use music or television while you are exercising so that the exercises are a pleasant break in your day. This will make your life better with the exercises acting as a break in your routine that you can look forward to.   Perform all exercises about fifteen times, three times per day or as directed.  You should exercise both the operative leg and the other leg as well.   Exercises include:   Quad Sets - Tighten up the muscle on the front of the thigh (Quad) and hold for 5-10 seconds.   Straight Leg Raises - With your knee straight (if you were given a brace, keep it on), lift the leg to 60 degrees, hold for 3 seconds, and slowly lower the leg.  Perform this exercise against resistance later as your leg gets stronger.  Leg Slides: Lying on your back, slowly slide your foot toward your buttocks, bending your knee up off the floor (only go as far as is comfortable). Then slowly slide your foot back down until your leg is flat on the floor again.  Angel Wings: Lying on your back spread your legs to the side as far apart as you can without causing discomfort.  Hamstring Strength:  Lying on your back, push your heel against the floor with your leg straight by tightening up the muscles of your buttocks.  Repeat, but this time bend your knee to a comfortable angle, and push your heel against the floor.  You may put a pillow under the  heel to make it more comfortable if necessary.   A rehabilitation program following joint replacement surgery can speed recovery and prevent re-injury in the future due to weakened muscles. Contact your doctor or a physical therapist for more information on knee rehabilitation.    CONSTIPATION  Constipation is defined medically as fewer than three stools per week and severe constipation as less than one stool per week.  Even if you have a regular bowel pattern at home, your normal regimen is likely to be disrupted due to multiple reasons following surgery.  Combination of anesthesia, postoperative narcotics, change in appetite and fluid intake all can affect your bowels.   YOU MUST use at least one of the following  options; they are listed in order of increasing strength to get the job done.  They are all available over the counter, and you may need to use some, POSSIBLY even all of these options:    Drink plenty of fluids (prune juice may be helpful) and high fiber foods Colace 100 mg by mouth twice a day  Senokot for constipation as directed and as needed Dulcolax (bisacodyl), take with full glass of water  Miralax (polyethylene glycol) once or twice a day as needed.  If you have tried all these things and are unable to have a bowel movement in the first 3-4 days after surgery call either your surgeon or your primary doctor.    If you experience loose stools or diarrhea, hold the medications until you stool forms back up.  If your symptoms do not get better within 1 week or if they get worse, check with your doctor.  If you experience "the worst abdominal pain ever" or develop nausea or vomiting, please contact the office immediately for further recommendations for treatment.   ITCHING:  If you experience itching with your medications, try taking only a single pain pill, or even half a pain pill at a time.  You can also use Benadryl over the counter for itching or also to help with sleep.    TED HOSE STOCKINGS:  Use stockings on both legs until for at least 2 weeks or as directed by physician office. They may be removed at night for sleeping.  MEDICATIONS:  See your medication summary on the "After Visit Summary" that nursing will review with you.  You may have some home medications which will be placed on hold until you complete the course of blood thinner medication.  It is important for you to complete the blood thinner medication as prescribed.  PRECAUTIONS:  If you experience chest pain or shortness of breath - call 911 immediately for transfer to the hospital emergency department.   If you develop a fever greater that 101 F, purulent drainage from wound, increased redness or drainage from wound, foul odor from the wound/dressing, or calf pain - CONTACT YOUR SURGEON.                                                   FOLLOW-UP APPOINTMENTS:  If you do not already have a post-op appointment, please call the office for an appointment to be seen by your surgeon.  Guidelines for how soon to be seen are listed in your "After Visit Summary", but are typically between 1-4 weeks after surgery.  OTHER INSTRUCTIONS:   Knee Replacement:  Do not place pillow under knee, focus on keeping the knee straight while resting. CPM instructions: 0-90 degrees, 2 hours in the morning, 2 hours in the afternoon, and 2 hours in the evening. Place foam block, curve side up under heel at all times except when in CPM or when walking.  DO NOT modify, tear, cut, or change the foam block in any way.  MAKE SURE YOU:  Understand these instructions.  Get help right away if you are not doing well or get worse.    Thank you for letting us be a part of your medical care team.  It is a privilege we respect greatly.  We hope these instructions will help you stay on track for a fast and  full recovery!   Driving restrictions   Complete by:  As directed    No driving for 6 weeks   Increase activity slowly as  tolerated   Complete by:  As directed    Lifting restrictions   Complete by:  As directed    No lifting for 8 weeks     Pt given discharge instructions and verbalized understanding. Pt not in any distress, IV removed.

## 2017-09-16 NOTE — Progress Notes (Signed)
Physical Therapy Treatment Patient Details Name: Mallory [name] MRN: [id number] DOB: [birth year] Today's Date: 09/16/2017    History of Present Illness Pt is a 61 y/o female s/p elective R TKA. PMH includes PE and GERD.     PT Comments    AM session today focusing on improving safety and independence with functional mobility with transfers and gait. Patient able to transfer and ambulate to bathroom with supervision and VC required for hand placement as well as LE placement to reduce pain. Patient ambulating well this morning, however with limited gait mechanics due to R knee immobilizer in place. Continued assistance needed for LE there ex, likely due to post op strength deficits. Will continue to follow acutely to maximize functional mobility prior to d/c.   Follow Up Recommendations  DC plan and follow up therapy as arranged by surgeon;Supervision for mobility/OOB     Equipment Recommendations  Rolling walker with 5" wheels;3in1 (PT)    Recommendations for Other Services       Precautions / Restrictions Restrictions Weight Bearing Restrictions: Yes RLE Weight Bearing: Weight bearing as tolerated    Mobility  Bed Mobility Overal bed mobility: Needs Assistance Bed Mobility: Supine to Sit;Sit to Supine     Supine to sit: Min assist Sit to supine: Min assist   General bed mobility comments: supporting R LE and maneuvering onto and off of bed  Transfers Overall transfer level: Needs assistance Equipment used: Rolling walker (2 wheeled) Transfers: Sit to/from Stand Sit to Stand: Supervision         General transfer comment: close Supervision for safety with immediate standing balance with VC for hand placement upon transfer as well as R LE placemetn wtih knee immobilizer in place  Ambulation/Gait Ambulation/Gait assistance: Supervision Ambulation Distance (Feet): 120 Feet Assistive device: Rolling walker (2 wheeled) Gait Pattern/deviations: Step-to  pattern;Decreased step length - left;Decreased stance time - right;Decreased dorsiflexion - right;Decreased weight shift to right;Antalgic     General Gait Details: patient requesting knee immobilizer use for support, therefore difficult to allow for normal knee flexion through swing phase of gait; limited due to pain   Stairs            Wheelchair Mobility    Modified Rankin (Stroke Patients Only)       Balance Overall balance assessment: Needs assistance Sitting-balance support: Single extremity supported;Feet supported Sitting balance-Leahy Scale: Good     Standing balance support: Bilateral upper extremity supported;During functional activity                                Cognition Arousal/Alertness: Awake/alert Behavior During Therapy: WFL for tasks assessed/performed Overall Cognitive Status: Within Functional Limits for tasks assessed                                        Exercises Total Joint Exercises Long Arc QuadSinclair Ship;Right;10 reps;Seated    General Comments        Pertinent Vitals/Pain Pain Assessment: 0-10 Pain Score: 4  Pain Location: R knee  Pain Descriptors / Indicators: Aching;Grimacing;Guarding Pain Intervention(s): Limited activity within patient's tolerance;Monitored during session;Repositioned    Home Living                      Prior Function            PT  Goals (current goals can now be found in the care plan section) Acute Rehab PT Goals Patient Stated Goal: to decrease pain  PT Goal Formulation: With patient Time For Goal Achievement: 09/20/17 Potential to Achieve Goals: Good Progress towards PT goals: Progressing toward goals    Frequency    7X/week      PT Plan Current plan remains appropriate    Co-evaluation              AM-PAC PT "6 Clicks" Daily Activity  Outcome Measure  Difficulty turning over in bed (including adjusting bedclothes, sheets and blankets)?: A  Lot Difficulty moving from lying on back to sitting on the side of the bed? : Unable Difficulty sitting down on and standing up from a chair with arms (e.g., wheelchair, bedside commode, etc,.)?: Unable Help needed moving to and from a bed to chair (including a wheelchair)?: A Little Help needed walking in hospital room?: A Little Help needed climbing 3-5 steps with a railing? : A Lot 6 Click Score: 12    End of Session Equipment Utilized During Treatment: Gait belt;Right knee immobilizer Activity Tolerance: Patient tolerated treatment well Patient left: in bed;with call bell/phone within reach;with family/visitor present Nurse Communication: Mobility status PT Visit Diagnosis: Other abnormalities of gait and mobility (R26.89);Pain Pain - Right/Left: Right Pain - part of body: Knee     Time: 0817-0850 PT Time Calculation (min) (ACUTE ONLY): 33 min  Charges:  $Gait Training: 8-22 mins $Therapeutic Activity: 8-22 mins                     Lanney Gins, PT, DPT 09/16/17 9:55 AM

## 2017-09-16 NOTE — Progress Notes (Signed)
     Subjective: 3 Days Post-Op Procedure(s) (LRB): RIGHT TOTAL KNEE ARTHROPLASTY (Right) Awake,alert and oriented x 4. Right knee with dressing intact dry no drainage.  Patient reports pain as moderate.    Objective:   VITALS:  Temp:  [98.4 F (36.9 C)-99.5 F (37.5 C)] 98.8 F (37.1 C) (12/08 0531) Pulse Rate:  [83-89] 88 (12/08 0531) Resp:  [18] 18 (12/07 1400) BP: (122-145)/(69-71) 145/71 (12/08 0531) SpO2:  [90 %-93 %] 93 % (12/08 0531) Weight:  [182 lb (82.6 kg)] 182 lb (82.6 kg) (12/07 1400)  Neurologically intact ABD soft Neurovascular intact Sensation intact distally Intact pulses distally Dorsiflexion/Plantar flexion intact Incision: dressing C/D/I   LABS Recent Labs    09/14/17 0508  HGB 14.0  WBC 17.7*  PLT 245   Recent Labs    09/14/17 0508  NA 137  K 3.9  CL 102  CO2 25  BUN 11  CREATININE 0.75  GLUCOSE 140*   No results for input(s): LABPT, INR in the last 72 hours.   Assessment/Plan: 3 Days Post-Op Procedure(s) (LRB): RIGHT TOTAL KNEE ARTHROPLASTY (Right)  Advance diet Up with therapy D/C IV fluids Discharge home with home health  [name] 09/16/2017, 11:53 AMPatient ID: Mallory [name], female   DOB: 1955/12/24, 61 y.o.   MRN: [id number]

## 2017-09-20 NOTE — Discharge Summary (Signed)
Patient ID: Mallory [name] MRN: [id number] DOB/AGE: May 18, 1956 61 y.o.  Admit date: 09/13/2017 Discharge date: 09/20/2017  Admission Diagnoses:  Principal Problem:   Status post total right knee replacement   Discharge Diagnoses:  Same  Past Medical History:  Diagnosis Date  . GERD (gastroesophageal reflux disease)   . HLD (hyperlipidemia)   . IBS (irritable bowel syndrome)   . Osteoarthritis of knee    Right  . Pulmonary embolism ([place])    5 yrs ago (unknown region)    Surgeries: Procedure(s): RIGHT TOTAL KNEE ARTHROPLASTY on 09/13/2017   Consultants:   Discharged Condition: Improved  Hospital Course: Mallory [name] is an 61 y.o. female who was admitted 09/13/2017 for operative treatment ofStatus post total right knee replacement. Patient has severe unremitting pain that affects sleep, daily activities, and work/hobbies. After pre-op clearance the patient was taken to the operating room on 09/13/2017 and underwent  Procedure(s): RIGHT TOTAL KNEE ARTHROPLASTY.    Patient was given perioperative antibiotics:  Anti-infectives (From admission, onward)   Start     Dose/Rate Route Frequency Ordered Stop   09/13/17 1430  ceFAZolin (ANCEF) IVPB 1 g/50 mL premix     1 g 100 mL/hr over 30 Minutes Intravenous Every 6 hours 09/13/17 1249 09/13/17 2230   09/13/17 0606  ceFAZolin (ANCEF) IVPB 2g/100 mL premix     2 g 200 mL/hr over 30 Minutes Intravenous On call to O.R. 09/13/17 0606 09/13/17 4010       Patient was given sequential compression devices, early ambulation, and chemoprophylaxis to prevent DVT.  Patient benefited maximally from hospital stay and there were no complications.    Recent vital signs: No data found.   Recent laboratory studies: No results for input(s): WBC, HGB, HCT, PLT, NA, K, CL, CO2, BUN, CREATININE, GLUCOSE, INR, CALCIUM in the last 72 hours.  Invalid input(s): PT, 2   Discharge Medications:   Allergies as of 09/16/2017      Reactions   Promethazine Hcl Hives   Hydromorphone Itching   Nitrofurantoin Monohyd Macro Itching      Medication List    TAKE these medications   acetaminophen 500 MG tablet Commonly known as:  TYLENOL Take 500 mg by mouth every 8 (eight) hours as needed for mild pain or moderate pain.   hyoscyamine 0.125 MG SL tablet Commonly known as:  LEVSIN SL TAKE ONE TABLET DAILY EVERY 4 HOURS AS NEEDED FOR CRAMPING.   ibuprofen 200 MG tablet Commonly known as:  ADVIL,MOTRIN Take 400 mg by mouth daily as needed for mild pain or moderate pain. Pain   methocarbamol 500 MG tablet Commonly known as:  ROBAXIN Take 1 tablet (500 mg total) by mouth every 6 (six) hours as needed for muscle spasms.   oxyCODONE-acetaminophen 5-325 MG tablet Commonly known as:  ROXICET Take 1-2 tablets by mouth every 4 (four) hours as needed.   pantoprazole 20 MG tablet Commonly known as:  PROTONIX TAKE (1) TABLET BY MOUTH TWICE DAILY.   pravastatin 10 MG tablet Commonly known as:  PRAVACHOL Take 1 tablet (10 mg total) daily by mouth.   rivaroxaban 10 MG Tabs tablet Commonly known as:  XARELTO Take 1 tablet (10 mg total) by mouth daily at 6 PM.   Turmeric 500 MG Caps Take 1 capsule by mouth daily as needed (anti-inflammatory).       Diagnostic Studies: Dg Knee Right Port  Result Date: 09/13/2017 CLINICAL DATA:  Right knee replacement EXAM: PORTABLE RIGHT KNEE - 1-2 VIEW COMPARISON:  None. FINDINGS: The right knee demonstrates a total knee arthroplasty without evidence of hardware failure complication. There is no significant joint effusion. There is no fracture or dislocation. The alignment is anatomic. Post-surgical changes noted in the surrounding soft tissues. IMPRESSION: Interval total right knee arthroplasty. Electronically Signed   By: Kathreen Devoid   On: 09/13/2017 11:08    Disposition: 01-Home or Self Care  Discharge Instructions    Call MD / Call 911   Complete by:  As directed    If you experience  chest pain or shortness of breath, CALL 911 and be transported to the hospital emergency room.  If you develope a fever above 101 F, pus (white drainage) or increased drainage or redness at the wound, or calf pain, call your surgeon's office.   Constipation Prevention   Complete by:  As directed    Drink plenty of fluids.  Prune juice may be helpful.  You may use a stool softener, such as Colace (over the counter) 100 mg twice a day.  Use MiraLax (over the counter) for constipation as needed.   Diet - low sodium heart healthy   Complete by:  As directed    Discharge instructions   Complete by:  As directed    Keep knee incision dry for 5 days post op then may wet while bathing. Therapy daily and CPM goal full extension and greater than 90 degrees flexion. Call if fever or chills or increased drainage. Go to ER if acutely short of breath or call for ambulance. Return for follow up in 2 weeks. May full weight bear on the surgical leg unless told otherwise. Use knee immobilizer until able to straight leg raise off bed with knee stable. In house walking for first 2 weeks. INSTRUCTIONS AFTER JOINT REPLACEMENT   Remove items at home which could result in a fall. This includes throw rugs or furniture in walking pathways ICE to the affected joint every three hours while awake for 30 minutes at a time, for at least the first 3-5 days, and then as needed for pain and swelling.  Continue to use ice for pain and swelling. You may notice swelling that will progress down to the foot and ankle.  This is normal after surgery.  Elevate your leg when you are not up walking on it.   Continue to use the breathing machine you got in the hospital (incentive spirometer) which will help keep your temperature down.  It is common for your temperature to cycle up and down following surgery, especially at night when you are not up moving around and exerting yourself.  The breathing machine keeps your lungs expanded and your  temperature down.   DIET:  As you were doing prior to hospitalization, we recommend a well-balanced diet.  DRESSING / WOUND CARE / SHOWERING  Keep the surgical dressing until follow up.  The dressing is water proof, so you can shower without any extra covering.  IF THE DRESSING FALLS OFF or the wound gets wet inside, change the dressing with sterile gauze.  Please use good hand washing techniques before changing the dressing.  Do not use any lotions or creams on the incision until instructed by your surgeon.    ACTIVITY  Increase activity slowly as tolerated, but follow the weight bearing instructions below.   No driving for 6 weeks or until further direction given by your physician.  You cannot drive while taking narcotics.  No lifting or carrying greater than 10 lbs. until  further directed by your surgeon. Avoid periods of inactivity such as sitting longer than an hour when not asleep. This helps prevent blood clots.  You may return to work once you are authorized by your doctor.     WEIGHT BEARING   Weight bearing as tolerated with assist device (walker, cane, etc) as directed, use it as long as suggested by your surgeon or therapist, typically at least 4-6 weeks.   EXERCISES  Results after joint replacement surgery are often greatly improved when you follow the exercise, range of motion and muscle strengthening exercises prescribed by your doctor. Safety measures are also important to protect the joint from further injury. Any time any of these exercises cause you to have increased pain or swelling, decrease what you are doing until you are comfortable again and then slowly increase them. If you have problems or questions, call your caregiver or physical therapist for advice.   Rehabilitation is important following a joint replacement. After just a few days of immobilization, the muscles of the leg can become weakened and shrink (atrophy).  These exercises are designed to build up the  tone and strength of the thigh and leg muscles and to improve motion. Often times heat used for twenty to thirty minutes before working out will loosen up your tissues and help with improving the range of motion but do not use heat for the first two weeks following surgery (sometimes heat can increase post-operative swelling).   These exercises can be done on a training (exercise) mat, on the floor, on a table or on a bed. Use whatever works the best and is most comfortable for you.    Use music or television while you are exercising so that the exercises are a pleasant break in your day. This will make your life better with the exercises acting as a break in your routine that you can look forward to.   Perform all exercises about fifteen times, three times per day or as directed.  You should exercise both the operative leg and the other leg as well.   Exercises include:   Quad Sets - Tighten up the muscle on the front of the thigh (Quad) and hold for 5-10 seconds.   Straight Leg Raises - With your knee straight (if you were given a brace, keep it on), lift the leg to 60 degrees, hold for 3 seconds, and slowly lower the leg.  Perform this exercise against resistance later as your leg gets stronger.  Leg Slides: Lying on your back, slowly slide your foot toward your buttocks, bending your knee up off the floor (only go as far as is comfortable). Then slowly slide your foot back down until your leg is flat on the floor again.  Angel Wings: Lying on your back spread your legs to the side as far apart as you can without causing discomfort.  Hamstring Strength:  Lying on your back, push your heel against the floor with your leg straight by tightening up the muscles of your buttocks.  Repeat, but this time bend your knee to a comfortable angle, and push your heel against the floor.  You may put a pillow under the heel to make it more comfortable if necessary.   A rehabilitation program following joint  replacement surgery can speed recovery and prevent re-injury in the future due to weakened muscles. Contact your doctor or a physical therapist for more information on knee rehabilitation.    CONSTIPATION  Constipation is defined medically as  fewer than three stools per week and severe constipation as less than one stool per week.  Even if you have a regular bowel pattern at home, your normal regimen is likely to be disrupted due to multiple reasons following surgery.  Combination of anesthesia, postoperative narcotics, change in appetite and fluid intake all can affect your bowels.   YOU MUST use at least one of the following options; they are listed in order of increasing strength to get the job done.  They are all available over the counter, and you may need to use some, POSSIBLY even all of these options:    Drink plenty of fluids (prune juice may be helpful) and high fiber foods Colace 100 mg by mouth twice a day  Senokot for constipation as directed and as needed Dulcolax (bisacodyl), take with full glass of water  Miralax (polyethylene glycol) once or twice a day as needed.  If you have tried all these things and are unable to have a bowel movement in the first 3-4 days after surgery call either your surgeon or your primary doctor.    If you experience loose stools or diarrhea, hold the medications until you stool forms back up.  If your symptoms do not get better within 1 week or if they get worse, check with your doctor.  If you experience "the worst abdominal pain ever" or develop nausea or vomiting, please contact the office immediately for further recommendations for treatment.   ITCHING:  If you experience itching with your medications, try taking only a single pain pill, or even half a pain pill at a time.  You can also use Benadryl over the counter for itching or also to help with sleep.   TED HOSE STOCKINGS:  Use stockings on both legs until for at least 2 weeks or as directed by  physician office. They may be removed at night for sleeping.  MEDICATIONS:  See your medication summary on the "After Visit Summary" that nursing will review with you.  You may have some home medications which will be placed on hold until you complete the course of blood thinner medication.  It is important for you to complete the blood thinner medication as prescribed.  PRECAUTIONS:  If you experience chest pain or shortness of breath - call 911 immediately for transfer to the hospital emergency department.   If you develop a fever greater that 101 F, purulent drainage from wound, increased redness or drainage from wound, foul odor from the wound/dressing, or calf pain - CONTACT YOUR SURGEON.                                                   FOLLOW-UP APPOINTMENTS:  If you do not already have a post-op appointment, please call the office for an appointment to be seen by your surgeon.  Guidelines for how soon to be seen are listed in your "After Visit Summary", but are typically between 1-4 weeks after surgery.  OTHER INSTRUCTIONS:   Knee Replacement:  Do not place pillow under knee, focus on keeping the knee straight while resting. CPM instructions: 0-90 degrees, 2 hours in the morning, 2 hours in the afternoon, and 2 hours in the evening. Place foam block, curve side up under heel at all times except when in CPM or when walking.  DO NOT modify, tear, cut,  or change the foam block in any way.  MAKE SURE YOU:  Understand these instructions.  Get help right away if you are not doing well or get worse.    Thank you for letting us be a part of your medical care team.  It is a privilege we respect greatly.  We hope these instructions will help you stay on track for a fast and full recovery!   Driving restrictions   Complete by:  As directed    No driving for 6 weeks   Increase activity slowly as tolerated   Complete by:  As directed    Lifting restrictions   Complete by:  As directed    No  lifting for 8 weeks      Follow-up Information    [facility] Follow up.   Specialty:  [place] Why:  A representative from [facility] will contact you to arrange start date and time for your therapy. Contact information: [address] [place] [address] [phone]        Mcarthur Rossetti, MD Follow up in 2 week(s).   Specialty:  Orthopedic Surgery Contact information: [place] [address] [phone]            Signed: Mcarthur Rossetti 09/20/2017, 8:42 AM

## 2017-09-21 ENCOUNTER — Other Ambulatory Visit: Payer: Self-pay | Admitting: *Deleted

## 2017-09-21 ENCOUNTER — Encounter: Payer: Self-pay | Admitting: *Deleted

## 2017-09-21 NOTE — Patient Outreach (Signed)
[facility]) Care Management  09/21/2017  Mallory [name] 1955-12-09 [id number]   Subjective: Telephone call to patient's home /mobile  number, spoke with patient, and HIPAA verified.  Discussed [facility] Care Management [employer] Transition of care follow up, patient voiced understanding, and is in agreement to follow up.  Patient states she is doing very well, receiving home health therapy, able to participate without difficulty, and has a follow up appointment with surgeon on 09/28/17.  Patient states she is able to manage self care and has assistance as needed.  Patient voices understanding of medical diagnosis, surgery,  and treatment plan.  States she is accessing her [employer]uermann benefits as needed via member services number on back of card or through [email or web address]   States she is very appreciative of the follow up and is in agreement to receive [place] Management information.     Objective: Per KPN (Knowledge Performance Now, point of care tool), [employer] iCollaborate, and chart review, patient hospitalized  09/13/17 -09/16/17 for Primary osteoarthritis and degenerative joint disease, right knee.   Status post Right total knee arthroplasty on 09/13/17.   Patient has a history of IBS (irritable bowel syndrome), HLD (hyperlipidemia), and pulmonary embolus ([place]).        Assessment: Received [employer] Transition of care referral on 09/19/17.   Transition of care follow up completed, no care management needs, and will proceed with case closure.      Plan: RNCM will send patient successful outreach letter, [facility] pamphlet, and magnet. RNCM will send case closure due to follow up completed / no care management needs request to [facility].     Bertran Zeimet H. Annia Friendly, BSN, [place] Management [facility] Telephonic CM Phone: [phone] Fax: [phone]

## 2017-09-27 ENCOUNTER — Telehealth (INDEPENDENT_AMBULATORY_CARE_PROVIDER_SITE_OTHER): Payer: Self-pay | Admitting: Orthopaedic Surgery

## 2017-09-27 ENCOUNTER — Other Ambulatory Visit (INDEPENDENT_AMBULATORY_CARE_PROVIDER_SITE_OTHER): Payer: Self-pay | Admitting: Orthopaedic Surgery

## 2017-09-27 MED ORDER — OXYCODONE-ACETAMINOPHEN 5-325 MG PO TABS: 1.0000 | ORAL_TABLET | Freq: Four times a day (QID) | ORAL | 0 refills | Status: DC | PRN

## 2017-09-27 NOTE — Telephone Encounter (Signed)
Patient called requesting an RX refill on her Oxycodone.  She has 4 pills left.  She wants to know if you can't call in the Oxycodone, can he call in something else that similar to the Oxycodone.  CB#[phone].  Thank you.

## 2017-09-27 NOTE — Telephone Encounter (Signed)
Can come and pick up a script 

## 2017-09-27 NOTE — Telephone Encounter (Signed)
Patient aware Rx ready at front desk  

## 2017-09-27 NOTE — Telephone Encounter (Signed)
Please advise 

## 2017-09-28 ENCOUNTER — Encounter (INDEPENDENT_AMBULATORY_CARE_PROVIDER_SITE_OTHER): Payer: Self-pay | Admitting: Physician Assistant

## 2017-09-28 ENCOUNTER — Ambulatory Visit (INDEPENDENT_AMBULATORY_CARE_PROVIDER_SITE_OTHER): Payer: Managed Care, Other (non HMO) | Admitting: Physician Assistant

## 2017-09-28 DIAGNOSIS — Z96651 Presence of right artificial knee joint: Secondary | ICD-10-CM

## 2017-09-28 MED ORDER — OXYCODONE-ACETAMINOPHEN 5-325 MG PO TABS: 1.0000 | ORAL_TABLET | Freq: Four times a day (QID) | ORAL | 0 refills | Status: DC | PRN

## 2017-09-28 NOTE — Progress Notes (Signed)
[name] returns today status post right total knee arthroplasty 09/13/2017.  She is overall doing well therapy note states that she is -5-92 degrees of flexion actively.  She has had no chest pain shortness of breath fevers chills.    Review of systems: Please see HPI otherwise negative  Physical exam right knee she has full extension flexion 85 degrees.  Calf supple nontender.  No instability valgus varus stressing.  Surgical incisions healing well without any signs of infection or dehiscence.  Impression: Status post right total knee arthroplasty  Plan: She will continue to work on range of motion strengthening.  She is given a prescription for outpatient physical therapy.  Refill of her Percocet was given today.  We will see her back in 1 month check her progress lack of.

## 2017-09-29 ENCOUNTER — Telehealth (INDEPENDENT_AMBULATORY_CARE_PROVIDER_SITE_OTHER): Payer: Self-pay

## 2017-09-29 NOTE — Telephone Encounter (Signed)
Katie from [facility]called stating patient to start P.T. Next week for knee. Requested op note from patients surgery. I faxed it to her [phone] per request

## 2017-10-04 ENCOUNTER — Telehealth (INDEPENDENT_AMBULATORY_CARE_PROVIDER_SITE_OTHER): Payer: Self-pay | Admitting: Orthopaedic Surgery

## 2017-10-04 NOTE — Telephone Encounter (Signed)
Sending back to you, I cannot find PT Rx to send with other notes.

## 2017-10-04 NOTE — Telephone Encounter (Signed)
Request Office note, Op note & PT Rx  [facility] call back # [id number] # [phone]

## 2017-10-05 ENCOUNTER — Other Ambulatory Visit (INDEPENDENT_AMBULATORY_CARE_PROVIDER_SITE_OTHER): Payer: Self-pay

## 2017-10-05 ENCOUNTER — Telehealth (INDEPENDENT_AMBULATORY_CARE_PROVIDER_SITE_OTHER): Payer: Self-pay | Admitting: Orthopaedic Surgery

## 2017-10-05 MED ORDER — METHOCARBAMOL 500 MG PO TABS: 500.0000 mg | ORAL_TABLET | Freq: Four times a day (QID) | ORAL | 0 refills | Status: DC | PRN

## 2017-10-05 NOTE — Telephone Encounter (Signed)
Faxed to provided number  

## 2017-10-05 NOTE — Telephone Encounter (Signed)
Please advise 

## 2017-10-05 NOTE — Telephone Encounter (Signed)
Called into pharmacy

## 2017-10-05 NOTE — Telephone Encounter (Signed)
[facility] called this morning and left VM pertaining to opp notes and progress notes

## 2017-10-05 NOTE — Telephone Encounter (Signed)
Patient called requesting a refill on her methocarbamol so she'll have it while she is in PT. CB #  [phone]

## 2017-10-05 NOTE — Telephone Encounter (Signed)
Ok to refill 

## 2017-10-11 DIAGNOSIS — M25561 Pain in right knee: Secondary | ICD-10-CM | POA: Diagnosis not present

## 2017-10-11 DIAGNOSIS — R262 Difficulty in walking, not elsewhere classified: Secondary | ICD-10-CM | POA: Diagnosis not present

## 2017-10-11 DIAGNOSIS — M25661 Stiffness of right knee, not elsewhere classified: Secondary | ICD-10-CM | POA: Diagnosis not present

## 2017-10-11 DIAGNOSIS — M25461 Effusion, right knee: Secondary | ICD-10-CM | POA: Diagnosis not present

## 2017-10-12 DIAGNOSIS — M25461 Effusion, right knee: Secondary | ICD-10-CM | POA: Diagnosis not present

## 2017-10-12 DIAGNOSIS — R262 Difficulty in walking, not elsewhere classified: Secondary | ICD-10-CM | POA: Diagnosis not present

## 2017-10-12 DIAGNOSIS — M25561 Pain in right knee: Secondary | ICD-10-CM | POA: Diagnosis not present

## 2017-10-12 DIAGNOSIS — M25661 Stiffness of right knee, not elsewhere classified: Secondary | ICD-10-CM | POA: Diagnosis not present

## 2017-10-17 DIAGNOSIS — M25661 Stiffness of right knee, not elsewhere classified: Secondary | ICD-10-CM | POA: Diagnosis not present

## 2017-10-17 DIAGNOSIS — M25461 Effusion, right knee: Secondary | ICD-10-CM | POA: Diagnosis not present

## 2017-10-17 DIAGNOSIS — M25561 Pain in right knee: Secondary | ICD-10-CM | POA: Diagnosis not present

## 2017-10-17 DIAGNOSIS — R262 Difficulty in walking, not elsewhere classified: Secondary | ICD-10-CM | POA: Diagnosis not present

## 2017-10-18 DIAGNOSIS — R262 Difficulty in walking, not elsewhere classified: Secondary | ICD-10-CM | POA: Diagnosis not present

## 2017-10-18 DIAGNOSIS — M25561 Pain in right knee: Secondary | ICD-10-CM | POA: Diagnosis not present

## 2017-10-18 DIAGNOSIS — M25461 Effusion, right knee: Secondary | ICD-10-CM | POA: Diagnosis not present

## 2017-10-18 DIAGNOSIS — M25661 Stiffness of right knee, not elsewhere classified: Secondary | ICD-10-CM | POA: Diagnosis not present

## 2017-10-19 DIAGNOSIS — M25561 Pain in right knee: Secondary | ICD-10-CM | POA: Diagnosis not present

## 2017-10-19 DIAGNOSIS — M25661 Stiffness of right knee, not elsewhere classified: Secondary | ICD-10-CM | POA: Diagnosis not present

## 2017-10-19 DIAGNOSIS — M25461 Effusion, right knee: Secondary | ICD-10-CM | POA: Diagnosis not present

## 2017-10-19 DIAGNOSIS — R262 Difficulty in walking, not elsewhere classified: Secondary | ICD-10-CM | POA: Diagnosis not present

## 2017-10-24 DIAGNOSIS — M25661 Stiffness of right knee, not elsewhere classified: Secondary | ICD-10-CM | POA: Diagnosis not present

## 2017-10-24 DIAGNOSIS — M25561 Pain in right knee: Secondary | ICD-10-CM | POA: Diagnosis not present

## 2017-10-24 DIAGNOSIS — M25461 Effusion, right knee: Secondary | ICD-10-CM | POA: Diagnosis not present

## 2017-10-24 DIAGNOSIS — R262 Difficulty in walking, not elsewhere classified: Secondary | ICD-10-CM | POA: Diagnosis not present

## 2017-10-25 ENCOUNTER — Telehealth (INDEPENDENT_AMBULATORY_CARE_PROVIDER_SITE_OTHER): Payer: Self-pay

## 2017-10-25 DIAGNOSIS — M25461 Effusion, right knee: Secondary | ICD-10-CM | POA: Diagnosis not present

## 2017-10-25 DIAGNOSIS — M25661 Stiffness of right knee, not elsewhere classified: Secondary | ICD-10-CM | POA: Diagnosis not present

## 2017-10-25 DIAGNOSIS — R262 Difficulty in walking, not elsewhere classified: Secondary | ICD-10-CM | POA: Diagnosis not present

## 2017-10-25 DIAGNOSIS — M25561 Pain in right knee: Secondary | ICD-10-CM | POA: Diagnosis not present

## 2017-10-25 NOTE — Telephone Encounter (Signed)
FYI

## 2017-10-25 NOTE — Telephone Encounter (Signed)
Patient called Triage phone. States she had SU 09/13/17 R TKA,   wanted to let us know that she had small amount of pus and was red on incision before. Doing better now no redness nor pus coming out now. She states she was putting neosporin and peroxide. Has scheduled appt on Monday.   CB [place]

## 2017-10-26 DIAGNOSIS — M25461 Effusion, right knee: Secondary | ICD-10-CM | POA: Diagnosis not present

## 2017-10-26 DIAGNOSIS — M25561 Pain in right knee: Secondary | ICD-10-CM | POA: Diagnosis not present

## 2017-10-26 DIAGNOSIS — M25661 Stiffness of right knee, not elsewhere classified: Secondary | ICD-10-CM | POA: Diagnosis not present

## 2017-10-26 DIAGNOSIS — R262 Difficulty in walking, not elsewhere classified: Secondary | ICD-10-CM | POA: Diagnosis not present

## 2017-10-30 ENCOUNTER — Encounter (INDEPENDENT_AMBULATORY_CARE_PROVIDER_SITE_OTHER): Payer: Self-pay | Admitting: Physician Assistant

## 2017-10-30 ENCOUNTER — Ambulatory Visit (INDEPENDENT_AMBULATORY_CARE_PROVIDER_SITE_OTHER): Payer: BLUE CROSS/BLUE SHIELD | Admitting: Physician Assistant

## 2017-10-30 DIAGNOSIS — Z96651 Presence of right artificial knee joint: Secondary | ICD-10-CM

## 2017-10-30 NOTE — Progress Notes (Signed)
Mrs. Meda Coffee returns today follow-up of her right total knee arthroplasty.  She is now 47 days postop.  She states she is having some achiness at night and ambulate with a cane but feels overall she is trending towards improvement.  She did have a little area of possible spit stitch which is healed.  She denies any calf pain.  Physical exam: General well-developed well-nourished female no acute distress. Right knee full extension flexion to 105 degrees.  No instability.  Surgical incisions healing well no signs of infection.  Calf supple nontender.  Impression: 47 days status post right total knee arthroplasty  Plan: This point time patient would like to return to work full duties as of 11/06/2017.  She is given a note to do this.  She is to continue to work with physical therapy for range of motion strengthening knee.  We will see her back in 1 month check her progress lack of.  Questions were encouraged and answered at length.  She is no longer on any pain medication at.

## 2017-10-31 DIAGNOSIS — M25461 Effusion, right knee: Secondary | ICD-10-CM | POA: Diagnosis not present

## 2017-10-31 DIAGNOSIS — M25661 Stiffness of right knee, not elsewhere classified: Secondary | ICD-10-CM | POA: Diagnosis not present

## 2017-10-31 DIAGNOSIS — R262 Difficulty in walking, not elsewhere classified: Secondary | ICD-10-CM | POA: Diagnosis not present

## 2017-10-31 DIAGNOSIS — M25561 Pain in right knee: Secondary | ICD-10-CM | POA: Diagnosis not present

## 2017-11-03 DIAGNOSIS — R262 Difficulty in walking, not elsewhere classified: Secondary | ICD-10-CM | POA: Diagnosis not present

## 2017-11-03 DIAGNOSIS — M25461 Effusion, right knee: Secondary | ICD-10-CM | POA: Diagnosis not present

## 2017-11-03 DIAGNOSIS — M25561 Pain in right knee: Secondary | ICD-10-CM | POA: Diagnosis not present

## 2017-11-03 DIAGNOSIS — M25661 Stiffness of right knee, not elsewhere classified: Secondary | ICD-10-CM | POA: Diagnosis not present

## 2017-11-07 DIAGNOSIS — M25561 Pain in right knee: Secondary | ICD-10-CM | POA: Diagnosis not present

## 2017-11-07 DIAGNOSIS — M25461 Effusion, right knee: Secondary | ICD-10-CM | POA: Diagnosis not present

## 2017-11-07 DIAGNOSIS — M25661 Stiffness of right knee, not elsewhere classified: Secondary | ICD-10-CM | POA: Diagnosis not present

## 2017-11-07 DIAGNOSIS — R262 Difficulty in walking, not elsewhere classified: Secondary | ICD-10-CM | POA: Diagnosis not present

## 2017-11-09 DIAGNOSIS — M25561 Pain in right knee: Secondary | ICD-10-CM | POA: Diagnosis not present

## 2017-11-09 DIAGNOSIS — M25661 Stiffness of right knee, not elsewhere classified: Secondary | ICD-10-CM | POA: Diagnosis not present

## 2017-11-09 DIAGNOSIS — M25461 Effusion, right knee: Secondary | ICD-10-CM | POA: Diagnosis not present

## 2017-11-09 DIAGNOSIS — R262 Difficulty in walking, not elsewhere classified: Secondary | ICD-10-CM | POA: Diagnosis not present

## 2017-11-10 ENCOUNTER — Other Ambulatory Visit: Payer: Self-pay | Admitting: Gastroenterology

## 2017-11-10 ENCOUNTER — Other Ambulatory Visit: Payer: Self-pay | Admitting: Internal Medicine

## 2017-11-10 DIAGNOSIS — K219 Gastro-esophageal reflux disease without esophagitis: Secondary | ICD-10-CM

## 2017-11-10 MED ORDER — PANTOPRAZOLE SODIUM 20 MG PO TBEC: DELAYED_RELEASE_TABLET | ORAL | 0 refills | Status: DC

## 2017-11-10 MED ORDER — HYOSCYAMINE SULFATE 0.125 MG SL SUBL: 0.1250 mg | SUBLINGUAL_TABLET | Freq: Two times a day (BID) | SUBLINGUAL | 0 refills | Status: DC

## 2017-11-10 NOTE — Telephone Encounter (Signed)
Received a faxed refill request from [facility] for pantoprazole. Refilled medication for 1 month, pt needs an appointment. Was last seen 08/2016 and was told to return in 1 year.

## 2017-11-10 NOTE — Telephone Encounter (Signed)
Received a faxed refill request from [facility] for Hyoscyamine. Refilled medication for 1 month, pt needs an appointment. Was last seen 08/2016 and was told to return in 1 year.

## 2017-11-14 DIAGNOSIS — M25661 Stiffness of right knee, not elsewhere classified: Secondary | ICD-10-CM | POA: Diagnosis not present

## 2017-11-14 DIAGNOSIS — R262 Difficulty in walking, not elsewhere classified: Secondary | ICD-10-CM | POA: Diagnosis not present

## 2017-11-14 DIAGNOSIS — M25561 Pain in right knee: Secondary | ICD-10-CM | POA: Diagnosis not present

## 2017-11-14 DIAGNOSIS — M25461 Effusion, right knee: Secondary | ICD-10-CM | POA: Diagnosis not present

## 2017-11-16 DIAGNOSIS — M25661 Stiffness of right knee, not elsewhere classified: Secondary | ICD-10-CM | POA: Diagnosis not present

## 2017-11-16 DIAGNOSIS — M25461 Effusion, right knee: Secondary | ICD-10-CM | POA: Diagnosis not present

## 2017-11-16 DIAGNOSIS — M25561 Pain in right knee: Secondary | ICD-10-CM | POA: Diagnosis not present

## 2017-11-16 DIAGNOSIS — R262 Difficulty in walking, not elsewhere classified: Secondary | ICD-10-CM | POA: Diagnosis not present

## 2017-11-22 DIAGNOSIS — R262 Difficulty in walking, not elsewhere classified: Secondary | ICD-10-CM | POA: Diagnosis not present

## 2017-11-22 DIAGNOSIS — M25661 Stiffness of right knee, not elsewhere classified: Secondary | ICD-10-CM | POA: Diagnosis not present

## 2017-11-22 DIAGNOSIS — M25561 Pain in right knee: Secondary | ICD-10-CM | POA: Diagnosis not present

## 2017-11-22 DIAGNOSIS — M25461 Effusion, right knee: Secondary | ICD-10-CM | POA: Diagnosis not present

## 2017-11-23 DIAGNOSIS — M25661 Stiffness of right knee, not elsewhere classified: Secondary | ICD-10-CM | POA: Diagnosis not present

## 2017-11-23 DIAGNOSIS — R262 Difficulty in walking, not elsewhere classified: Secondary | ICD-10-CM | POA: Diagnosis not present

## 2017-11-23 DIAGNOSIS — M25461 Effusion, right knee: Secondary | ICD-10-CM | POA: Diagnosis not present

## 2017-11-23 DIAGNOSIS — M25561 Pain in right knee: Secondary | ICD-10-CM | POA: Diagnosis not present

## 2017-11-28 DIAGNOSIS — M25661 Stiffness of right knee, not elsewhere classified: Secondary | ICD-10-CM | POA: Diagnosis not present

## 2017-11-28 DIAGNOSIS — M25561 Pain in right knee: Secondary | ICD-10-CM | POA: Diagnosis not present

## 2017-11-28 DIAGNOSIS — M25461 Effusion, right knee: Secondary | ICD-10-CM | POA: Diagnosis not present

## 2017-11-28 DIAGNOSIS — R262 Difficulty in walking, not elsewhere classified: Secondary | ICD-10-CM | POA: Diagnosis not present

## 2017-11-30 ENCOUNTER — Ambulatory Visit (INDEPENDENT_AMBULATORY_CARE_PROVIDER_SITE_OTHER): Payer: BLUE CROSS/BLUE SHIELD | Admitting: Physician Assistant

## 2017-11-30 ENCOUNTER — Encounter (INDEPENDENT_AMBULATORY_CARE_PROVIDER_SITE_OTHER): Payer: Self-pay | Admitting: Physician Assistant

## 2017-11-30 DIAGNOSIS — Z96651 Presence of right artificial knee joint: Secondary | ICD-10-CM

## 2017-11-30 NOTE — Progress Notes (Signed)
HPI: [name] returns today 78 days status post right total knee arthroplasty.  States overall the knee is progressing towards improvement.  She is much better than she was preop.  She is finished with physical therapy.  She does have some pain at times in her quad muscles.  No chest pain shortness breath fevers chills.  Taking ibuprofen which helps with her pain.  She continues her home exercise program.  Physical exam: Right knee she has full extension flexion to 115 degrees.  No instability valgus varus stressing.  Surgical incisions healing well no signs of infection.  Calf supple nontender.  Impression: Status post right total knee arthroplasty  Plan: She will continue her home exercise program.  She will follow with [place] in December of this year had a one year anniversary and will obtain AP and lateral views of the knee.  We will see her sooner if there is any questions or concerns.

## 2017-12-08 ENCOUNTER — Telehealth: Payer: Self-pay | Admitting: Gastroenterology

## 2017-12-08 NOTE — Telephone Encounter (Signed)
The pt advised that she will have the insurance company send a prior auth to our office to try and get it covered

## 2017-12-10 ENCOUNTER — Other Ambulatory Visit: Payer: Self-pay | Admitting: Gastroenterology

## 2017-12-10 DIAGNOSIS — K219 Gastro-esophageal reflux disease without esophagitis: Secondary | ICD-10-CM

## 2017-12-12 NOTE — Telephone Encounter (Signed)
The pt provided the insurance number to call and get more information regarding the medication. [phone]

## 2017-12-12 NOTE — Telephone Encounter (Signed)
Pt states the reason her insurance will not approve hycosamine because it is not FDA approved. Pt is asking what she needs to do at this point.Best call back# [phone].

## 2017-12-13 NOTE — Telephone Encounter (Signed)
The pt has been advised that the insurance will not cover hyoscyamine.  I also called the pharmacy and asked what her cash price would be.  $ 45 is the cash price and I did advise the pt and she will decide what she wants to do and call me if needed

## 2018-01-09 ENCOUNTER — Other Ambulatory Visit: Payer: Self-pay | Admitting: Gastroenterology

## 2018-01-09 DIAGNOSIS — K219 Gastro-esophageal reflux disease without esophagitis: Secondary | ICD-10-CM

## 2018-01-30 ENCOUNTER — Encounter: Payer: Self-pay | Admitting: Internal Medicine

## 2018-01-30 ENCOUNTER — Ambulatory Visit (INDEPENDENT_AMBULATORY_CARE_PROVIDER_SITE_OTHER): Payer: BLUE CROSS/BLUE SHIELD | Admitting: Internal Medicine

## 2018-01-30 VITALS — BP 116/72 | HR 60 | Temp 97.8°F | Ht 64.0 in | Wt 181.0 lb

## 2018-01-30 DIAGNOSIS — M1711 Unilateral primary osteoarthritis, right knee: Secondary | ICD-10-CM

## 2018-01-30 DIAGNOSIS — K582 Mixed irritable bowel syndrome: Secondary | ICD-10-CM

## 2018-01-30 DIAGNOSIS — K219 Gastro-esophageal reflux disease without esophagitis: Secondary | ICD-10-CM | POA: Diagnosis not present

## 2018-01-30 DIAGNOSIS — E7800 Pure hypercholesterolemia, unspecified: Secondary | ICD-10-CM

## 2018-01-30 DIAGNOSIS — Z0000 Encounter for general adult medical examination without abnormal findings: Secondary | ICD-10-CM

## 2018-01-30 LAB — COMPREHENSIVE METABOLIC PANEL
ALT: 22 U/L (ref 0–35)
AST: 18 U/L (ref 0–37)
Albumin: 4 g/dL (ref 3.5–5.2)
Alkaline Phosphatase: 82 U/L (ref 39–117)
BUN: 14 mg/dL (ref 6–23)
CO2: 30 meq/L (ref 19–32)
Calcium: 9.4 mg/dL (ref 8.4–10.5)
Chloride: 103 mEq/L (ref 96–112)
Creatinine, Ser: 0.75 mg/dL (ref 0.40–1.20)
GFR: 83.9 mL/min (ref >60.00)
GLUCOSE: 104 mg/dL — AB (ref 70–99)
POTASSIUM: 3.9 meq/L (ref 3.5–5.1)
SODIUM: 139 meq/L (ref 135–145)
TOTAL PROTEIN: 6.9 g/dL (ref 6.0–8.3)
Total Bilirubin: 0.5 mg/dL (ref 0.2–1.2)

## 2018-01-30 LAB — CBC
HEMATOCRIT: 47 % — AB (ref 36.0–46.0)
HEMOGLOBIN: 15.9 g/dL — AB (ref 12.0–15.0)
MCHC: 33.8 g/dL (ref 30.0–36.0)
MCV: 83.1 fl (ref 78.0–100.0)
Platelets: 250 10*3/uL (ref 150.0–400.0)
RBC: 5.65 Mil/uL — ABNORMAL HIGH (ref 3.87–5.11)
RDW: 14.7 % (ref 11.5–15.5)
WBC: 8.1 10*3/uL (ref 4.0–10.5)

## 2018-01-30 LAB — LIPID PANEL
CHOL/HDL RATIO: 4
Cholesterol: 192 mg/dL (ref 0–200)
HDL: 46.4 mg/dL (ref >39.00)
LDL Cholesterol: 120 mg/dL — ABNORMAL HIGH (ref 0–99)
NONHDL: 145.25
Triglycerides: 128 mg/dL (ref 0.0–149.0)
VLDL: 25.6 mg/dL (ref 0.0–40.0)

## 2018-01-30 LAB — VITAMIN D 25 HYDROXY (VIT D DEFICIENCY, FRACTURES): VITD: 48.84 ng/mL (ref 30.00–100.00)

## 2018-01-30 NOTE — Patient Instructions (Signed)
Health Maintenance for Postmenopausal Women Menopause is a normal process in which your reproductive ability comes to an end. This process happens gradually over a span of months to years, usually between the ages of 22 and 9. Menopause is complete when you have missed 12 consecutive menstrual periods. It is important to talk with your health care provider about some of the most common conditions that affect postmenopausal women, such as heart disease, cancer, and bone loss (osteoporosis). Adopting a healthy lifestyle and getting preventive care can help to promote your health and wellness. Those actions can also lower your chances of developing some of these common conditions. What should I know about menopause? During menopause, you may experience a number of symptoms, such as:  Moderate-to-severe hot flashes.  Night sweats.  Decrease in sex drive.  Mood swings.  Headaches.  Tiredness.  Irritability.  Memory problems.  Insomnia.  Choosing to treat or not to treat menopausal changes is an individual decision that you make with your health care provider. What should I know about hormone replacement therapy and supplements? Hormone therapy products are effective for treating symptoms that are associated with menopause, such as hot flashes and night sweats. Hormone replacement carries certain risks, especially as you become older. If you are thinking about using estrogen or estrogen with progestin treatments, discuss the benefits and risks with your health care provider. What should I know about heart disease and stroke? Heart disease, heart attack, and stroke become more likely as you age. This may be due, in part, to the hormonal changes that your body experiences during menopause. These can affect how your body processes dietary fats, triglycerides, and cholesterol. Heart attack and stroke are both medical emergencies. There are many things that you can do to help prevent heart disease  and stroke:  Have your blood pressure checked at least every 1-2 years. High blood pressure causes heart disease and increases the risk of stroke.  If you are 53-22 years old, ask your health care provider if you should take aspirin to prevent a heart attack or a stroke.  Do not use any tobacco products, including cigarettes, chewing tobacco, or electronic cigarettes. If you need help quitting, ask your health care provider.  It is important to eat a healthy diet and maintain a healthy weight. ? Be sure to include plenty of vegetables, fruits, low-fat dairy products, and lean protein. ? Avoid eating foods that are high in solid fats, added sugars, or salt (sodium).  Get regular exercise. This is one of the most important things that you can do for your health. ? Try to exercise for at least 150 minutes each week. The type of exercise that you do should increase your heart rate and make you sweat. This is known as moderate-intensity exercise. ? Try to do strengthening exercises at least twice each week. Do these in addition to the moderate-intensity exercise.  Know your numbers.Ask your health care provider to check your cholesterol and your blood glucose. Continue to have your blood tested as directed by your health care provider.  What should I know about cancer screening? There are several types of cancer. Take the following steps to reduce your risk and to catch any cancer development as early as possible. Breast Cancer  Practice breast self-awareness. ? This means understanding how your breasts normally appear and feel. ? It also means doing regular breast self-exams. Let your health care provider know about any changes, no matter how small.  If you are 40  or older, have a clinician do a breast exam (clinical breast exam or CBE) every year. Depending on your age, family history, and medical history, it may be recommended that you also have a yearly breast X-ray (mammogram).  If you  have a family history of breast cancer, talk with your health care provider about genetic screening.  If you are at high risk for breast cancer, talk with your health care provider about having an MRI and a mammogram every year.  Breast cancer (BRCA) gene test is recommended for women who have family members with BRCA-related cancers. Results of the assessment will determine the need for genetic counseling and BRCA1 and for BRCA2 testing. BRCA-related cancers include these types: ? Breast. This occurs in males or females. ? Ovarian. ? Tubal. This may also be called fallopian tube cancer. ? Cancer of the abdominal or pelvic lining (peritoneal cancer). ? Prostate. ? Pancreatic.  Cervical, Uterine, and Ovarian Cancer Your health care provider may recommend that you be screened regularly for cancer of the pelvic organs. These include your ovaries, uterus, and vagina. This screening involves a pelvic exam, which includes checking for microscopic changes to the surface of your cervix (Pap test).  For women ages 21-65, health care providers may recommend a pelvic exam and a Pap test every three years. For women ages 79-65, they may recommend the Pap test and pelvic exam, combined with testing for human papilloma virus (HPV), every five years. Some types of HPV increase your risk of cervical cancer. Testing for HPV may also be done on women of any age who have unclear Pap test results.  Other health care providers may not recommend any screening for nonpregnant women who are considered low risk for pelvic cancer and have no symptoms. Ask your health care provider if a screening pelvic exam is right for you.  If you have had past treatment for cervical cancer or a condition that could lead to cancer, you need Pap tests and screening for cancer for at least 20 years after your treatment. If Pap tests have been discontinued for you, your risk factors (such as having a new sexual partner) need to be  reassessed to determine if you should start having screenings again. Some women have medical problems that increase the chance of getting cervical cancer. In these cases, your health care provider may recommend that you have screening and Pap tests more often.  If you have a family history of uterine cancer or ovarian cancer, talk with your health care provider about genetic screening.  If you have vaginal bleeding after reaching menopause, tell your health care provider.  There are currently no reliable tests available to screen for ovarian cancer.  Lung Cancer Lung cancer screening is recommended for adults 69-62 years old who are at high risk for lung cancer because of a history of smoking. A yearly low-dose CT scan of the lungs is recommended if you:  Currently smoke.  Have a history of at least 30 pack-years of smoking and you currently smoke or have quit within the past 15 years. A pack-year is smoking an average of one pack of cigarettes per day for one year.  Yearly screening should:  Continue until it has been 15 years since you quit.  Stop if you develop a health problem that would prevent you from having lung cancer treatment.  Colorectal Cancer  This type of cancer can be detected and can often be prevented.  Routine colorectal cancer screening usually begins at  age 42 and continues through age 45.  If you have risk factors for colon cancer, your health care provider may recommend that you be screened at an earlier age.  If you have a family history of colorectal cancer, talk with your health care provider about genetic screening.  Your health care provider may also recommend using home test kits to check for hidden blood in your stool.  A small camera at the end of a tube can be used to examine your colon directly (sigmoidoscopy or colonoscopy). This is done to check for the earliest forms of colorectal cancer.  Direct examination of the colon should be repeated every  5-10 years until age 71. However, if early forms of precancerous polyps or small growths are found or if you have a family history or genetic risk for colorectal cancer, you may need to be screened more often.  Skin Cancer  Check your skin from head to toe regularly.  Monitor any moles. Be sure to tell your health care provider: ? About any new moles or changes in moles, especially if there is a change in a mole's shape or color. ? If you have a mole that is larger than the size of a pencil eraser.  If any of your family members has a history of skin cancer, especially at a young age, talk with your health care provider about genetic screening.  Always use sunscreen. Apply sunscreen liberally and repeatedly throughout the day.  Whenever you are outside, protect yourself by wearing long sleeves, pants, a wide-brimmed hat, and sunglasses.  What should I know about osteoporosis? Osteoporosis is a condition in which bone destruction happens more quickly than new bone creation. After menopause, you may be at an increased risk for osteoporosis. To help prevent osteoporosis or the bone fractures that can happen because of osteoporosis, the following is recommended:  If you are 46-71 years old, get at least 1,000 mg of calcium and at least 600 mg of vitamin D per day.  If you are older than age 55 but younger than age 65, get at least 1,200 mg of calcium and at least 600 mg of vitamin D per day.  If you are older than age 54, get at least 1,200 mg of calcium and at least 800 mg of vitamin D per day.  Smoking and excessive alcohol intake increase the risk of osteoporosis. Eat foods that are rich in calcium and vitamin D, and do weight-bearing exercises several times each week as directed by your health care provider. What should I know about how menopause affects my mental health? Depression may occur at any age, but it is more common as you become older. Common symptoms of depression  include:  Low or sad mood.  Changes in sleep patterns.  Changes in appetite or eating patterns.  Feeling an overall lack of motivation or enjoyment of activities that you previously enjoyed.  Frequent crying spells.  Talk with your health care provider if you think that you are experiencing depression. What should I know about immunizations? It is important that you get and maintain your immunizations. These include:  Tetanus, diphtheria, and pertussis (Tdap) booster vaccine.  Influenza every year before the flu season begins.  Pneumonia vaccine.  Shingles vaccine.  Your health care provider may also recommend other immunizations. This information is not intended to replace advice given to you by your health care provider. Make sure you discuss any questions you have with your health care provider. Document Released: 11/18/2005  Document Revised: 04/15/2016 Document Reviewed: 06/30/2015 Elsevier Interactive Patient Education  2018 [employer] Inc[employer]  

## 2018-01-30 NOTE — Progress Notes (Signed)
Subjective:    Patient ID: Mallory [name], female    DOB: 1955/12/20, 62 y.o.   MRN: [id number]  HPI  Pt presents to the clinic today for her annual exam. She is also due to follow up chronic conditions.  GERD: Triggered by fried, greasy and spicy foods. She denies breakthrough on her current dose of Pantoprazole.   HLD: Her last LDL was 122,11/2016. She denies myalgias on Pravastatin. She tries to consume a balanced diet but reports she could be better at it.  IBS: She reports insurance is not covering her Bentyl.  She is currently paying out of pocket for it..  OA: Mainly in her knees. S/p right TKR 09/2017. She takes Tylenol or Advil as needed for joint pains.  Flu: 07/2017 Tetanus: 12/2014 Pap Smear: scheduled 02/2018 Mammogram: 08/2017 Colon Screening: 04/2012 Vision Screening: annually Dentist: biannually  Diet: She does eat meat. She consumes fruits and veggies daily. She tries to avoid fried foods. She drinks tea, Coke Zero Exercise: Getting back into after TKR.  Review of Systems      Past Medical History:  Diagnosis Date  . GERD (gastroesophageal reflux disease)   . HLD (hyperlipidemia)   . IBS (irritable bowel syndrome)   . Osteoarthritis of knee    Right  . Pulmonary embolism ([place])    5 yrs ago (unknown region)    Current Outpatient Medications  Medication Sig Dispense Refill  . acetaminophen (TYLENOL) 500 MG tablet Take 500 mg by mouth every 8 (eight) hours as needed for mild pain or moderate pain.    . hyoscyamine (LEVSIN SL) 0.125 MG SL tablet Take 1 tablet (0.125 mg total) by mouth 2 (two) times daily. 60 tablet 0  . pantoprazole (PROTONIX) 20 MG tablet TAKE (1) TABLET BY MOUTH TWICE DAILY. 60 tablet 0  . pravastatin (PRAVACHOL) 10 MG tablet Take 1 tablet (10 mg total) by mouth daily. MUST SCHEDULE ANNUAL PHYSICAL 90 tablet 0  . ibuprofen (ADVIL,MOTRIN) 200 MG tablet Take 400 mg by mouth daily as needed for mild pain or moderate pain. Pain     No  current facility-administered medications for this visit.     Allergies  Allergen Reactions  . Promethazine Hcl Hives  . Hydromorphone Itching  . Macrolides And Ketolides Itching  . Nitrofurantoin Monohyd Macro Itching    Family History  Problem Relation Age of Onset  . Diabetes Mother   . Heart failure Father   . Diabetes Brother   . Heart disease Sister   . Heart disease Maternal Grandmother   . Cancer Maternal Grandfather        ? pancreatic or lung  . Anesthesia problems Neg Hx   . Hypotension Neg Hx   . Malignant hyperthermia Neg Hx   . Pseudochol deficiency Neg Hx   . Stroke Neg Hx     Social History   Socioeconomic History  . Marital status: Single    Spouse name: Not on file  . Number of children: 2  . Years of education: Not on file  . Highest education level: Not on file  Occupational History  . Occupation: Museum/gallery exhibitions officer: [employer]  Social Needs  . Financial resource strain: Not on file  . Food insecurity:    Worry: Not on file    Inability: Not on file  . Transportation needs:    Medical: Not on file    Non-medical: Not on file  Tobacco Use  . Smoking status: Never Smoker  .  Smokeless tobacco: Never Used  Substance and Sexual Activity  . Alcohol use: Yes    Alcohol/week: 0.0 oz    Comment: occasional wine  . Drug use: No  . Sexual activity: Yes    Birth control/protection: Post-menopausal  Lifestyle  . Physical activity:    Days per week: Not on file    Minutes per session: Not on file  . Stress: Not on file  Relationships  . Social connections:    Talks on phone: Not on file    Gets together: Not on file    Attends religious service: Not on file    Active member of club or organization: Not on file    Attends meetings of clubs or organizations: Not on file    Relationship status: Not on file  . Intimate partner violence:    Fear of current or ex partner: Not on file    Emotionally abused: Not on file    Physically  abused: Not on file    Forced sexual activity: Not on file  Other Topics Concern  . Not on file  Social History Narrative   Lives w/ 74 yr old daughter.  2 daughters           Constitutional: Denies fever, malaise, fatigue, headache or abrupt weight changes.  HEENT: Denies eye pain, eye redness, ear pain, ringing in the ears, wax buildup, runny nose, nasal congestion, bloody nose, or sore throat. Respiratory: Denies difficulty breathing, shortness of breath, cough or sputum production.   Cardiovascular: Denies chest pain, chest tightness, palpitations or swelling in the hands or feet.  Gastrointestinal: Pt reports alternating constipation and diarrhea. Denies abdominal pain, bloating, or blood in the stool.  GU: Denies urgency, frequency, pain with urination, burning sensation, blood in urine, odor or discharge. Musculoskeletal: Pt reports intermittent joint pain. Denies decrease in range of motion, difficulty with gait, muscle pain or joint  swelling.  Skin: Denies redness, rashes, lesions or ulcercations.  Neurological: Denies dizziness, difficulty with memory, difficulty with speech or problems with balance and coordination.  Psych: Denies anxiety, depression, SI/HI.  No other specific complaints in a complete review of systems (except as listed in HPI above).  Objective:   Physical Exam   BP 116/72 (BP Location: Right Arm, Patient Position: Sitting, Cuff Size: Normal)   Pulse 60   Temp 97.8 F (36.6 C) (Oral)   Ht 5\' 4"  (1.626 m)   Wt 181 lb (82.1 kg)   LMP 05/11/2011   SpO2 99%   BMI 31.07 kg/m  Wt Readings from Last 3 Encounters:  01/30/18 181 lb (82.1 kg)  09/15/17 182 lb (82.6 kg)  09/07/17 182 lb 1 oz (82.6 kg)    General: Appears her stated age, well developed, well nourished in NAD. Skin: Warm, dry and intact.  HEENT: Head: normal shape and size; Eyes: sclera white, no icterus, conjunctiva pink, PERRLA and EOMs intact; Ears: Tm's gray and intact, normal light  reflex; Throat/Mouth: Teeth present, mucosa pink and moist, no exudate, lesions or ulcerations noted.  Neck:  Neck supple, trachea midline. No masses, lumps or thyromegaly present.  Cardiovascular: Normal rate and rhythm. S1,S2 noted.  No murmur, rubs or gallops noted. No JVD or BLE edema. No carotid bruits noted. Pulmonary/Chest: Normal effort and positive vesicular breath sounds. No respiratory distress. No wheezes, rales or ronchi noted.  Abdomen: Soft and nontender. Normal bowel sounds. No distention or masses noted. Liver, spleen and kidneys non palpable. Musculoskeletal: Strength 5/5 BUE/BLE.Marland Kitchen No difficulty with  gait.  Neurological: Alert and oriented. Cranial nerves II-XII grossly intact. Coordination normal.  Psychiatric: Mood and affect normal. Behavior is normal. Judgment and thought content normal.     BMET    Component Value Date/Time   NA 137 09/14/2017 0508   K 3.9 09/14/2017 0508   CL 102 09/14/2017 0508   CO2 25 09/14/2017 0508   GLUCOSE 140 (H) 09/14/2017 0508   BUN 11 09/14/2017 0508   CREATININE 0.75 09/14/2017 0508   CREATININE 0.85 11/03/2011 1205   CALCIUM 8.8 (L) 09/14/2017 0508   GFRNONAA >60 09/14/2017 0508   GFRAA >60 09/14/2017 0508    Lipid Panel     Component Value Date/Time   CHOL 190 12/06/2016 0842   TRIG 85.0 12/06/2016 0842   HDL 51.00 12/06/2016 0842   CHOLHDL 4 12/06/2016 0842   VLDL 17.0 12/06/2016 0842   LDLCALC 122 (H) 12/06/2016 0842    CBC    Component Value Date/Time   WBC 17.7 (H) 09/14/2017 0508   RBC 5.08 09/14/2017 0508   HGB 14.0 09/14/2017 0508   HCT 43.1 09/14/2017 0508   PLT 245 09/14/2017 0508   MCV 84.8 09/14/2017 0508   MCH 27.6 09/14/2017 0508   MCHC 32.5 09/14/2017 0508   RDW 13.9 09/14/2017 0508   LYMPHSABS 2.6 03/25/2016 1036   MONOABS 0.7 03/25/2016 1036   EOSABS 0.2 03/25/2016 1036   BASOSABS 0.0 03/25/2016 1036    Hgb A1C Lab Results  Component Value Date   HGBA1C 6.3 12/23/2014             Assessment & Plan:    Preventative Health Maintenance:  Encouraged her to get a flu shot in the fall Tetanus UTD Pap smear scheduled Mammogram UTD Colon screening UTD Encouraged her to consume a balanced diet and exercise regimen Advised her to see an eye doctor and dentist annually Will check CBC, CMET, Lipid, and Vit D today  RTC in 1 year, sooner if needed Webb Silversmith, NP

## 2018-01-31 DIAGNOSIS — M1990 Unspecified osteoarthritis, unspecified site: Secondary | ICD-10-CM | POA: Insufficient documentation

## 2018-01-31 MED ORDER — PRAVASTATIN SODIUM 10 MG PO TABS: 10.0000 mg | ORAL_TABLET | Freq: Every day | ORAL | 3 refills | Status: DC

## 2018-01-31 MED ORDER — PANTOPRAZOLE SODIUM 20 MG PO TBEC: 20.0000 mg | DELAYED_RELEASE_TABLET | Freq: Two times a day (BID) | ORAL | 3 refills | Status: DC

## 2018-01-31 NOTE — Assessment & Plan Note (Signed)
CMET and Lipid profile today Encouraged her to consume a low fat diet Pravastatin refilled today

## 2018-01-31 NOTE — Assessment & Plan Note (Signed)
Will check CBC and CMET today Discussed avoiding foods that trigger reflux Discussed how weight loss can help improve reflux Pantoprazole refilled today

## 2018-01-31 NOTE — Assessment & Plan Note (Signed)
Controlled on Bentyl Will monitor

## 2018-01-31 NOTE — Assessment & Plan Note (Signed)
Advised her to continue Tylenol and Ibuprofen as needed

## 2018-02-07 ENCOUNTER — Telehealth: Payer: Self-pay

## 2018-02-07 NOTE — Telephone Encounter (Signed)
Pt is aware it could be from dehydration as she had not had anything to eat or drink since the night before.... Also there could be many other factorstoo many red blood cells, high lipid levels, and other conditions  Pt advised to increase water intake

## 2018-02-14 DIAGNOSIS — Z01419 Encounter for gynecological examination (general) (routine) without abnormal findings: Secondary | ICD-10-CM | POA: Diagnosis not present

## 2018-02-14 DIAGNOSIS — Z6831 Body mass index (BMI) 31.0-31.9, adult: Secondary | ICD-10-CM | POA: Diagnosis not present

## 2018-02-14 DIAGNOSIS — Z1151 Encounter for screening for human papillomavirus (HPV): Secondary | ICD-10-CM | POA: Diagnosis not present

## 2018-04-17 ENCOUNTER — Telehealth: Payer: Self-pay | Admitting: Gastroenterology

## 2018-04-18 DIAGNOSIS — L280 Lichen simplex chronicus: Secondary | ICD-10-CM | POA: Diagnosis not present

## 2018-04-18 DIAGNOSIS — L292 Pruritus vulvae: Secondary | ICD-10-CM | POA: Diagnosis not present

## 2018-04-18 NOTE — Telephone Encounter (Signed)
Spoke to patient. Informed her that she needed an appointment for any further medication refills. An appointment has been scheduled for September, patient states she has enough to get her through until then.

## 2018-07-04 ENCOUNTER — Ambulatory Visit (INDEPENDENT_AMBULATORY_CARE_PROVIDER_SITE_OTHER): Payer: BLUE CROSS/BLUE SHIELD | Admitting: Gastroenterology

## 2018-07-04 ENCOUNTER — Encounter: Payer: Self-pay | Admitting: Gastroenterology

## 2018-07-04 VITALS — BP 114/68 | HR 62 | Ht 63.0 in | Wt 187.1 lb

## 2018-07-04 DIAGNOSIS — K589 Irritable bowel syndrome without diarrhea: Secondary | ICD-10-CM | POA: Diagnosis not present

## 2018-07-04 MED ORDER — HYOSCYAMINE SULFATE 0.125 MG SL SUBL: 0.1250 mg | SUBLINGUAL_TABLET | SUBLINGUAL | 6 refills | Status: DC | PRN

## 2018-07-04 NOTE — Patient Instructions (Addendum)
Levsin SL .125mg  tabs, one tab as needed for abdominal cramping, disp 60 with 6 refills.  Normal BMI (Body Mass Index- based on height and weight) is between 19 and 25. Your BMI today is Body mass index is 33.15 kg/m. Marland Kitchen Please consider follow up  regarding your BMI with your Primary Care Provider.  Thank you for entrusting me with your care and choosing [facility].  Dr Ardis Hughs

## 2018-07-04 NOTE — Progress Notes (Signed)
Previous GI testing:  08/2011 ERCPwith Dr. Laural Golden, for right upper quadrant pain, elevated liver tests. ERCP was normal. He performed biliary sphincterotomy for possible sphincter of Oddi dysfunction. Complicated by post procedure pains, event. 11/2011 EGDwith Dr. Laural Golden for epigastric pain, right upper quadrant pain. Some small polyps were removed from the stomach that were hyperplastic on pathology. The examination was otherwise normal 01/2012 endoscopic ultrasound,Dr. Ardis Hughs, referred by Dr. Jearld Adjutant for evaluation of persistent right upper quadrant pain. The examination was normal. 08/2012 ColonoscopyDr. Laural Golden, for screening, persistent abdominal pain, essentially normal. 2015 CT scan abdomenfor abdominal pain was essentially normal Labs 12/2014 cbc, cmet normal LFTs 2012-2016persistently normal except mild elevated in single trnasaminase   HPI: This is a very pleasant 62 year old woman whom I last saw about 2 years ago  She has chronic intermittent abdominal pains consistent with irritable bowel syndrome.  She is here for refills of her antispasmodic medicine.  She takes levsin SL after spicy food or if she gets too hunger.Marland Kitchen  She will have epigastric crampy pains. The SL pill works very fast and completely she only takes it twice a week.    Chief complaint is irritable bowel syndrome ROS: complete GI ROS as described in HPI, all other review negative.  Constitutional:  No unintentional weight loss  Her weight is up 25 pounds in 2 years since her last visit here, same scale in our office   Past Medical History:  Diagnosis Date  . GERD (gastroesophageal reflux disease)   . HLD (hyperlipidemia)   . IBS (irritable bowel syndrome)   . Osteoarthritis of knee    Right  . Pulmonary embolism ([place])    5 yrs ago (unknown region)    Past Surgical History:  Procedure Laterality Date  . [place]   mc  . CHOLECYSTECTOMY  15 yrs ago   mc  . COLONOSCOPY   05/24/2012   Procedure: COLONOSCOPY;  Surgeon: Rogene Houston, MD;  [facility];  Service: Endoscopy;  Laterality: N/A;  220  . ERCP  08/12/2011   Procedure: ENDOSCOPIC RETROGRADE CHOLANGIOPANCREATOGRAPHY (ERCP);  Surgeon: Rogene Houston, MD;  Location: [facility];  Service: Endoscopy;  Laterality: N/A;  . ERCP W/ SPHICTEROTOMY  08/12/11   Dr Rehman-?microlithiasis, SOD  . ESOPHAGOGASTRODUODENOSCOPY  11/25/2011   Procedure: ESOPHAGOGASTRODUODENOSCOPY (EGD);  Surgeon: Rogene Houston, MD;  [facility];  Service: Endoscopy;  Laterality: N/A;  830  . EUS  01/19/2012   Procedure: UPPER ENDOSCOPIC ULTRASOUND (EUS) LINEAR;  Surgeon: Milus Banister, MD;  [facility];  Service: Endoscopy;  Laterality: N/A;  pt moved up a hour early by AW , Patty @ [place] office ok'd / pt was called by AW  . SPHINCTEROTOMY  08/12/2011   Procedure: SPHINCTEROTOMY;  Surgeon: Rogene Houston, MD;  [facility]: [facility];  Service: Endoscopy;;  with ballon passage  . TOTAL KNEE ARTHROPLASTY Right 09/13/2017  . TOTAL KNEE ARTHROPLASTY Right 09/13/2017   Procedure: RIGHT TOTAL KNEE ARTHROPLASTY;  Surgeon: Mcarthur Rossetti, MD;  Location: [place];  Service: Orthopedics;  Laterality: Right;  . TUBAL LIGATION      Current Outpatient Medications  Medication Sig Dispense Refill  . acetaminophen (TYLENOL) 500 MG tablet Take 500 mg by mouth every 8 (eight) hours as needed for mild pain or moderate pain.    . hyoscyamine (LEVSIN SL) 0.125 MG SL tablet Take 1 tablet (0.125 mg total) by mouth 2 (two) times daily. 60 tablet 0  . ibuprofen (  ADVIL,MOTRIN) 200 MG tablet Take 400 mg by mouth daily as needed for mild pain or moderate pain. Pain    . pantoprazole (PROTONIX) 20 MG tablet Take 1 tablet (20 mg total) by mouth 2 (two) times daily. 180 tablet 3  . pravastatin (PRAVACHOL) 10 MG tablet Take 1 tablet (10 mg total) by mouth daily. 90 tablet 3   No current facility-administered medications for this visit.      Allergies as of 07/04/2018 - Review Complete 07/04/2018  Allergen Reaction Noted  . Promethazine hcl Hives 08/14/2011  . Hydromorphone Itching 08/08/2011  . Macrolides and ketolides Itching 08/04/2014  . Nitrofurantoin monohyd macro Itching 01/19/2012    Family History  Problem Relation Age of Onset  . Diabetes Mother   . Heart failure Father   . Diabetes Brother   . Heart disease Sister   . Heart disease Maternal Grandmother   . Cancer Maternal Grandfather        ? pancreatic or lung  . Anesthesia problems Neg Hx   . Hypotension Neg Hx   . Malignant hyperthermia Neg Hx   . Pseudochol deficiency Neg Hx   . Stroke Neg Hx     Social History   Socioeconomic History  . Marital status: Single    Spouse name: Not on file  . Number of children: 2  . Years of education: Not on file  . Highest education level: Not on file  Occupational History  . Occupation: Museum/gallery exhibitions officer: [employer]  Social Needs  . Financial resource strain: Not on file  . Food insecurity:    Worry: Not on file    Inability: Not on file  . Transportation needs:    Medical: Not on file    Non-medical: Not on file  Tobacco Use  . Smoking status: Never Smoker  . Smokeless tobacco: Never Used  Substance and Sexual Activity  . Alcohol use: Yes    Alcohol/week: 0.0 standard drinks    Comment: occasional wine  . Drug use: No  . Sexual activity: Yes    Birth control/protection: Post-menopausal  Lifestyle  . Physical activity:    Days per week: Not on file    Minutes per session: Not on file  . Stress: Not on file  Relationships  . Social connections:    Talks on phone: Not on file    Gets together: Not on file    Attends religious service: Not on file    Active member of club or organization: Not on file    Attends meetings of clubs or organizations: Not on file    Relationship status: Not on file  . Intimate partner violence:    Fear of current or ex partner: Not on file     Emotionally abused: Not on file    Physically abused: Not on file    Forced sexual activity: Not on file  Other Topics Concern  . Not on file  Social History Narrative   Lives w/ 55 yr old daughter.  2 daughters           Physical Exam: BP 114/68   Pulse 62   Ht 5\' 3"  (1.6 m)   Wt 187 lb 2 oz (84.9 kg)   LMP 05/11/2011   BMI 33.15 kg/m  Constitutional: generally well-appearing Psychiatric: alert and oriented x3 Abdomen: soft, nontender, nondistended, no obvious ascites, no peritoneal signs, normal bowel sounds No peripheral edema noted in lower extremities  Assessment and plan: 62 y.o. female  with irritable bowel syndrome  Antispasmodic sublingually seems to help her very well with her epigastric intermittent cramping.  She only takes 1 or 2 sublingual tabs /week and the usually help very quickly.  I am happy to refill this for her very she understands that if she was still requires this prescription medicine 2 years from now that I would like to see her in follow-up but I am happy to refill it until then.  Please see the "Patient Instructions" section for addition details about the plan.  Owens Loffler, MD [facility] 07/04/2018, 4:00 PM

## 2018-08-09 DIAGNOSIS — Z1231 Encounter for screening mammogram for malignant neoplasm of breast: Secondary | ICD-10-CM | POA: Diagnosis not present

## 2018-08-09 DIAGNOSIS — L900 Lichen sclerosus et atrophicus: Secondary | ICD-10-CM | POA: Diagnosis not present

## 2018-08-09 LAB — HM MAMMOGRAPHY

## 2018-08-23 ENCOUNTER — Ambulatory Visit (INDEPENDENT_AMBULATORY_CARE_PROVIDER_SITE_OTHER): Payer: BLUE CROSS/BLUE SHIELD

## 2018-08-23 DIAGNOSIS — Z23 Encounter for immunization: Secondary | ICD-10-CM | POA: Diagnosis not present

## 2018-09-10 ENCOUNTER — Ambulatory Visit (INDEPENDENT_AMBULATORY_CARE_PROVIDER_SITE_OTHER): Payer: BLUE CROSS/BLUE SHIELD | Admitting: Internal Medicine

## 2018-09-10 ENCOUNTER — Encounter: Payer: Self-pay | Admitting: Internal Medicine

## 2018-09-10 VITALS — BP 118/74 | HR 61 | Temp 98.0°F | Wt 188.0 lb

## 2018-09-10 DIAGNOSIS — B9789 Other viral agents as the cause of diseases classified elsewhere: Secondary | ICD-10-CM

## 2018-09-10 DIAGNOSIS — J069 Acute upper respiratory infection, unspecified: Secondary | ICD-10-CM

## 2018-09-10 MED ORDER — METHYLPREDNISOLONE ACETATE 80 MG/ML IJ SUSP
80.0000 mg | Freq: Once | INTRAMUSCULAR | Status: AC
  Administered 2018-09-10: 80 mg via INTRAMUSCULAR

## 2018-09-10 MED ORDER — BENZONATATE 200 MG PO CAPS: 200.0000 mg | ORAL_CAPSULE | Freq: Three times a day (TID) | ORAL | 0 refills | Status: DC | PRN

## 2018-09-10 NOTE — Addendum Note (Signed)
Addended by: Lurlean Nanny on: 09/10/2018 04:34 PM   Modules accepted: Orders

## 2018-09-10 NOTE — Patient Instructions (Signed)
Upper Respiratory Infection, Adult Most upper respiratory infections (URIs) are caused by a virus. A URI affects the nose, throat, and upper air passages. The most common type of URI is often called "the common cold." Follow these instructions at home:  Take medicines only as told by your doctor.  Gargle warm saltwater or take cough drops to comfort your throat as told by your doctor.  Use a warm mist humidifier or inhale steam from a shower to increase air moisture. This may make it easier to breathe.  Drink enough fluid to keep your pee (urine) clear or pale yellow.  Eat soups and other clear broths.  Have a healthy diet.  Rest as needed.  Go back to work when your fever is gone or your doctor says it is okay. ? You may need to stay home longer to avoid giving your URI to others. ? You can also wear a face mask and wash your hands often to prevent spread of the virus.  Use your inhaler more if you have asthma.  Do not use any tobacco products, including cigarettes, chewing tobacco, or electronic cigarettes. If you need help quitting, ask your doctor. Contact a doctor if:  You are getting worse, not better.  Your symptoms are not helped by medicine.  You have chills.  You are getting more short of breath.  You have brown or red mucus.  You have yellow or brown discharge from your nose.  You have pain in your face, especially when you bend forward.  You have a fever.  You have puffy (swollen) neck glands.  You have pain while swallowing.  You have white areas in the back of your throat. Get help right away if:  You have very bad or constant: ? Headache. ? Ear pain. ? Pain in your forehead, behind your eyes, and over your cheekbones (sinus pain). ? Chest pain.  You have long-lasting (chronic) lung disease and any of the following: ? Wheezing. ? Long-lasting cough. ? Coughing up blood. ? A change in your usual mucus.  You have a stiff neck.  You have  changes in your: ? Vision. ? Hearing. ? Thinking. ? Mood. This information is not intended to replace advice given to you by your health care provider. Make sure you discuss any questions you have with your health care provider. Document Released: 03/14/2008 Document Revised: 05/29/2016 Document Reviewed: 01/01/2014 Elsevier Interactive Patient Education  2018 Elsevier Inc.  

## 2018-09-10 NOTE — Progress Notes (Signed)
HPI  Pt presents to the clinic today with c/o sore throat and cough. She reports this started 2 days ago. She denies difficulty swallowing. The cough is non productive. She denies runny nose, nasal congestion, ear pain or shortness of breath. She denies fever, chills or body aches. She has tried Copywriter, advertising and Mucinex without any relief. She has no history of allergies. She has had sick contacts.  Review of Systems      Past Medical History:  Diagnosis Date  . GERD (gastroesophageal reflux disease)   . HLD (hyperlipidemia)   . IBS (irritable bowel syndrome)   . Osteoarthritis of knee    Right  . Pulmonary embolism ([facility])    5 yrs ago (unknown region)    Family History  Problem Relation Age of Onset  . Diabetes Mother   . Heart failure Father   . Diabetes Brother   . Heart disease Sister   . Heart disease Maternal Grandmother   . Cancer Maternal Grandfather        ? pancreatic or lung  . Anesthesia problems Neg Hx   . Hypotension Neg Hx   . Malignant hyperthermia Neg Hx   . Pseudochol deficiency Neg Hx   . Stroke Neg Hx     Social History   Socioeconomic History  . Marital status: Single    Spouse name: Not on file  . Number of children: 2  . Years of education: Not on file  . Highest education level: Not on file  Occupational History  . Occupation: Museum/gallery exhibitions officer: [employer]  Social Needs  . Financial resource strain: Not on file  . Food insecurity:    Worry: Not on file    Inability: Not on file  . Transportation needs:    Medical: Not on file    Non-medical: Not on file  Tobacco Use  . Smoking status: Never Smoker  . Smokeless tobacco: Never Used  Substance and Sexual Activity  . Alcohol use: Yes    Alcohol/week: 0.0 standard drinks    Comment: occasional wine  . Drug use: No  . Sexual activity: Yes    Birth control/protection: Post-menopausal  Lifestyle  . Physical activity:    Days per week: Not on file    Minutes per session:  Not on file  . Stress: Not on file  Relationships  . Social connections:    Talks on phone: Not on file    Gets together: Not on file    Attends religious service: Not on file    Active member of club or organization: Not on file    Attends meetings of clubs or organizations: Not on file    Relationship status: Not on file  . Intimate partner violence:    Fear of current or ex partner: Not on file    Emotionally abused: Not on file    Physically abused: Not on file    Forced sexual activity: Not on file  Other Topics Concern  . Not on file  Social History Narrative   Lives w/ 72 yr old daughter.  2 daughters          Allergies  Allergen Reactions  . Promethazine Hcl Hives  . Hydromorphone Itching  . Macrolides And Ketolides Itching  . Nitrofurantoin Monohyd Macro Itching     Constitutional:  Denies headache, fatigue, fever or abrupt weight changes.  HEENT:  Positive sore throat. Denies eye redness, eye pain, pressure behind the eyes, facial pain, nasal  congestion, ear pain, ringing in the ears, wax buildup, runny nose or bloody nose. Respiratory: Positive cough. Denies difficulty breathing or shortness of breath.  Cardiovascular: Denies chest pain, chest tightness, palpitations or swelling in the hands or feet.   No other specific complaints in a complete review of systems (except as listed in HPI above).  Objective:   Wt 188 lb (85.3 kg)   LMP 05/11/2011   BMI 33.30 kg/m  Wt Readings from Last 3 Encounters:  09/10/18 188 lb (85.3 kg)  07/04/18 187 lb 2 oz (84.9 kg)  01/30/18 181 lb (82.1 kg)     General: Appears her stated age, in NAD. HEENT: Head: normal shape and size, no sinus tenderness noted; Ears: Tm's gray and intact, normal light reflex; Nose: mucosa pink and moist, septum midline; Throat/Mouth: + PND. Teeth present, mucosa erythematous and moist, no exudate noted, no lesions or ulcerations noted.  Neck: No cervical lymphadenopathy.  Cardiovascular:  Normal rate and rhythm. S1,S2 noted.  No murmur, rubs or gallops noted.  Pulmonary/Chest: Normal effort and positive vesicular breath sounds. No respiratory distress. No wheezes, rales or ronchi noted.       Assessment & Plan:   Viral Upper Respiratory Infection with Cough:  Get some rest and drink plenty of water Do salt water gargles for the sore throat 80 mg Depo IM today Start Zyrtec OTC, 2 x day for 3 days then daily thereafter RX for Tessalon Pearls  RTC as needed or if symptoms persist.   Webb Silversmith, NP

## 2018-09-27 ENCOUNTER — Ambulatory Visit (INDEPENDENT_AMBULATORY_CARE_PROVIDER_SITE_OTHER): Payer: Self-pay

## 2018-09-27 ENCOUNTER — Ambulatory Visit (INDEPENDENT_AMBULATORY_CARE_PROVIDER_SITE_OTHER): Payer: BLUE CROSS/BLUE SHIELD | Admitting: Physician Assistant

## 2018-09-27 ENCOUNTER — Encounter (INDEPENDENT_AMBULATORY_CARE_PROVIDER_SITE_OTHER): Payer: Self-pay | Admitting: Physician Assistant

## 2018-09-27 DIAGNOSIS — M25561 Pain in right knee: Secondary | ICD-10-CM

## 2018-09-27 DIAGNOSIS — Z96651 Presence of right artificial knee joint: Secondary | ICD-10-CM

## 2018-09-27 DIAGNOSIS — G8929 Other chronic pain: Secondary | ICD-10-CM | POA: Diagnosis not present

## 2018-09-27 NOTE — Progress Notes (Signed)
Office Visit Note   Patient: Mallory [name]           Date of Birth: [birth year]           MRN: [id number] Visit Date: 09/27/2018              Requested by: Jearld Fenton, NP [address] [place], [place] [address] PCP: Jearld Fenton, NP   Assessment & Plan: Visit Diagnoses:  1. Chronic pain of right knee     Plan: She will continue to work on range of motion strengthening of the knee.  Follow-up with [place] on an as-needed basis.  Questions encouraged and answered  Follow-Up Instructions: No follow-ups on file.   Orders:  Orders Placed This Encounter  Procedures  . XR Knee 1-2 Views Right   No orders of the defined types were placed in this encounter.     Procedures: No procedures performed   Clinical Data: No additional findings.   Subjective: Chief Complaint  Patient presents with  . Right Knee - Pain    HPI Mallory [name] returns today 1 year status post right total knee arthroplasty.  She states overall the knee is doing very well.  She has no complaints.  No pain.  Some stiffness at times. Review of Systems Please see HPI otherwise negative  Objective: Vital Signs: LMP 05/11/2011   Physical Exam General: Well-developed well-nourished female no acute distress mood affect appropriate Psych alert and oriented x3. Ortho Exam Right knee full extension flexion to 115 degrees.  No instability valgus varus stressing.  Surgical incisions well-healed.  Calf supple nontender. Specialty Comments:  No specialty comments available.  Imaging: No results found.   PMFS History: Patient Active Problem List   Diagnosis Date Noted  . OA (osteoarthritis) 01/31/2018  . IBS (irritable bowel syndrome) 12/06/2016  . HLD (hyperlipidemia) 11/16/2015  . GERD (gastroesophageal reflux disease) 08/14/2011  . History of pulmonary embolus (PE) 08/14/2011   Past Medical History:  Diagnosis Date  . GERD (gastroesophageal reflux disease)   . HLD (hyperlipidemia)     . IBS (irritable bowel syndrome)   . Osteoarthritis of knee    Right  . Pulmonary embolism ([facility])    5 yrs ago (unknown region)    Family History  Problem Relation Age of Onset  . Diabetes Mother   . Heart failure Father   . Diabetes Brother   . Heart disease Sister   . Heart disease Maternal Grandmother   . Cancer Maternal Grandfather        ? pancreatic or lung  . Anesthesia problems Neg Hx   . Hypotension Neg Hx   . Malignant hyperthermia Neg Hx   . Pseudochol deficiency Neg Hx   . Stroke Neg Hx     Past Surgical History:  Procedure Laterality Date  . [place]   mc  . CHOLECYSTECTOMY  15 yrs ago   mc  . COLONOSCOPY  05/24/2012   Procedure: COLONOSCOPY;  Surgeon: Rogene Houston, MD;  [facility];  Service: Endoscopy;  Laterality: N/A;  220  . ERCP  08/12/2011   Procedure: ENDOSCOPIC RETROGRADE CHOLANGIOPANCREATOGRAPHY (ERCP);  Surgeon: Rogene Houston, MD;  [facility];  Service: Endoscopy;  Laterality: N/A;  . ERCP W/ SPHICTEROTOMY  08/12/11   Dr Rehman-?microlithiasis, SOD  . ESOPHAGOGASTRODUODENOSCOPY  11/25/2011   Procedure: ESOPHAGOGASTRODUODENOSCOPY (EGD);  Surgeon: Rogene Houston, MD;  [facility];  Service: Endoscopy;  Laterality: N/A;  830  . EUS  01/19/2012   Procedure: UPPER ENDOSCOPIC ULTRASOUND (EUS) LINEAR;  Surgeon: Milus Banister, MD;  [facility];  Service: Endoscopy;  Laterality: N/A;  pt moved up a hour early by AW , Patty @ [place] office ok'd / pt was called by AW  . SPHINCTEROTOMY  08/12/2011   Procedure: SPHINCTEROTOMY;  Surgeon: Rogene Houston, MD;  [facility];  Service: Endoscopy;;  with ballon passage  . TOTAL KNEE ARTHROPLASTY Right 09/13/2017  . TOTAL KNEE ARTHROPLASTY Right 09/13/2017   Procedure: RIGHT TOTAL KNEE ARTHROPLASTY;  Surgeon: Mcarthur Rossetti, MD;  Location: [place];  Service: Orthopedics;  Laterality: Right;  . TUBAL LIGATION     Social History   Occupational  History  . Occupation: Museum/gallery exhibitions officer: [employer]  Tobacco Use  . Smoking status: Never Smoker  . Smokeless tobacco: Never Used  Substance and Sexual Activity  . Alcohol use: Yes    Alcohol/week: 0.0 standard drinks    Comment: occasional wine  . Drug use: No  . Sexual activity: Yes    Birth control/protection: Post-menopausal

## 2019-01-22 ENCOUNTER — Telehealth: Payer: Self-pay | Admitting: Internal Medicine

## 2019-01-22 NOTE — Telephone Encounter (Signed)
Patient called for refill Pravastain  15 tablets left [place], [place], [place]

## 2019-01-24 ENCOUNTER — Ambulatory Visit (INDEPENDENT_AMBULATORY_CARE_PROVIDER_SITE_OTHER): Payer: BLUE CROSS/BLUE SHIELD | Admitting: Internal Medicine

## 2019-01-24 DIAGNOSIS — K219 Gastro-esophageal reflux disease without esophagitis: Secondary | ICD-10-CM | POA: Diagnosis not present

## 2019-01-24 DIAGNOSIS — M1711 Unilateral primary osteoarthritis, right knee: Secondary | ICD-10-CM | POA: Diagnosis not present

## 2019-01-24 DIAGNOSIS — Z86711 Personal history of pulmonary embolism: Secondary | ICD-10-CM

## 2019-01-24 DIAGNOSIS — E7800 Pure hypercholesterolemia, unspecified: Secondary | ICD-10-CM

## 2019-01-24 DIAGNOSIS — K589 Irritable bowel syndrome without diarrhea: Secondary | ICD-10-CM | POA: Diagnosis not present

## 2019-01-24 NOTE — Progress Notes (Signed)
Virtual Visit via Video Note  I connected with Mallory [name] on 01/24/19 at  4:00 PM EDT by a video enabled telemedicine application and verified that I am speaking with the correct person using two identifiers.   I discussed the limitations of evaluation and management by telemedicine and the availability of in person appointments. The patient expressed understanding and agreed to proceed.  Patient Location: Home Provider Location: Office  History of Present Illness:  Pt due for follow up of chronic conditions   Hx of PE: No reoccurrence. No current anticoagulation.  OA: Mainly in her knees. Improved with 20 lb weight loss. She takes Ibuprofen and Tylenol as needed with good relief.  IBS: Alternating constipation and diarrhea. She takes Hyoscyamine as needed with good relief.  HLD: Her last LDL was 120, 01/2018. She denies myalgias on Pravastatin. She has been consuming a low fat diet.  GERD: She denies breakthrough on Pantoprazole. Upper GI from 11/2011 reviewed.  Observations/Objective:  Alert and oriented x 3 NAD No obvious shortness of breath Well kempt Behavior, judgement and thought content are normal.  Assessment and Plan:  See problem based charting  Follow Up Instructions:    I discussed the assessment and treatment plan with the patient. The patient was provided an opportunity to ask questions and all were answered. The patient agreed with the plan and demonstrated an understanding of the instructions.   The patient was advised to call back or seek an in-person evaluation if the symptoms worsen or if the condition fails to improve as anticipated.     Webb Silversmith, NP

## 2019-01-25 ENCOUNTER — Other Ambulatory Visit (INDEPENDENT_AMBULATORY_CARE_PROVIDER_SITE_OTHER): Payer: BLUE CROSS/BLUE SHIELD

## 2019-01-25 ENCOUNTER — Encounter: Payer: Self-pay | Admitting: Internal Medicine

## 2019-01-25 DIAGNOSIS — E7800 Pure hypercholesterolemia, unspecified: Secondary | ICD-10-CM

## 2019-01-25 DIAGNOSIS — K219 Gastro-esophageal reflux disease without esophagitis: Secondary | ICD-10-CM | POA: Diagnosis not present

## 2019-01-25 DIAGNOSIS — M1711 Unilateral primary osteoarthritis, right knee: Secondary | ICD-10-CM

## 2019-01-25 DIAGNOSIS — K589 Irritable bowel syndrome without diarrhea: Secondary | ICD-10-CM

## 2019-01-25 LAB — COMPREHENSIVE METABOLIC PANEL
ALT: 29 U/L (ref 0–35)
AST: 25 U/L (ref 0–37)
Albumin: 4.2 g/dL (ref 3.5–5.2)
Alkaline Phosphatase: 94 U/L (ref 39–117)
BUN: 15 mg/dL (ref 6–23)
CO2: 29 mEq/L (ref 19–32)
Calcium: 9.9 mg/dL (ref 8.4–10.5)
Chloride: 103 mEq/L (ref 96–112)
Creatinine, Ser: 0.78 mg/dL (ref 0.40–1.20)
GFR: 75.19 mL/min (ref >60.00)
Glucose, Bld: 97 mg/dL (ref 70–99)
Potassium: 4.5 mEq/L (ref 3.5–5.1)
Sodium: 140 mEq/L (ref 135–145)
Total Bilirubin: 0.8 mg/dL (ref 0.2–1.2)
Total Protein: 7 g/dL (ref 6.0–8.3)

## 2019-01-25 LAB — CBC
HCT: 49.1 % — ABNORMAL HIGH (ref 36.0–46.0)
Hemoglobin: 16.5 g/dL — ABNORMAL HIGH (ref 12.0–15.0)
MCHC: 33.6 g/dL (ref 30.0–36.0)
MCV: 85.7 fl (ref 78.0–100.0)
Platelets: 212 10*3/uL (ref 150.0–400.0)
RBC: 5.73 Mil/uL — ABNORMAL HIGH (ref 3.87–5.11)
RDW: 14 % (ref 11.5–15.5)
WBC: 7.9 10*3/uL (ref 4.0–10.5)

## 2019-01-25 LAB — LIPID PANEL
Cholesterol: 189 mg/dL (ref 0–200)
HDL: 43.4 mg/dL (ref >39.00)
LDL Cholesterol: 122 mg/dL — ABNORMAL HIGH (ref 0–99)
NonHDL: 145.67
Total CHOL/HDL Ratio: 4
Triglycerides: 119 mg/dL (ref 0.0–149.0)
VLDL: 23.8 mg/dL (ref 0.0–40.0)

## 2019-01-25 NOTE — Assessment & Plan Note (Signed)
Better with weight loss Encouraged regular physical activity Continue Ibuprofen or Tylenol as needed

## 2019-01-25 NOTE — Assessment & Plan Note (Signed)
Continue Hyoscyamine Will monitor

## 2019-01-25 NOTE — Assessment & Plan Note (Signed)
No reoccurrence No anticoag Will monitor

## 2019-01-25 NOTE — Assessment & Plan Note (Signed)
Will have her set up lab only appt for CBC and CMET Continue Pantoprazole for now

## 2019-01-25 NOTE — Assessment & Plan Note (Signed)
Will have her set up lab only appt for CMET and Lipid profile Encouraged her to consume a low fat diet Continue Pravastatin for now

## 2019-01-25 NOTE — Patient Instructions (Signed)
Fat and Cholesterol Restricted Eating Plan Getting too much fat and cholesterol in your diet may cause health problems. Choosing the right foods helps keep your fat and cholesterol at normal levels. This can keep you from getting certain diseases. Your doctor may recommend an eating plan that includes:  Total fat: ______% or less of total calories a day.  Saturated fat: ______% or less of total calories a day.  Cholesterol: less than _________mg a day.  Fiber: ______g a day. What are tips for following this plan? Meal planning  At meals, divide your plate into four equal parts: ? Fill one-half of your plate with vegetables and green salads. ? Fill one-fourth of your plate with whole grains. ? Fill one-fourth of your plate with low-fat (lean) protein foods.  Eat fish that is high in omega-3 fats at least two times a week. This includes mackerel, tuna, sardines, and salmon.  Eat foods that are high in fiber, such as whole grains, beans, apples, broccoli, carrots, peas, and barley. General tips   Work with your doctor to lose weight if you need to.  Avoid: ? Foods with added sugar. ? Fried foods. ? Foods with partially hydrogenated oils.  Limit alcohol intake to no more than 1 drink a day for nonpregnant women and 2 drinks a day for men. One drink equals 12 oz of beer, 5 oz of wine, or 1 oz of hard liquor. Reading food labels  Check food labels for: ? Trans fats. ? Partially hydrogenated oils. ? Saturated fat (g) in each serving. ? Cholesterol (mg) in each serving. ? Fiber (g) in each serving.  Choose foods with healthy fats, such as: ? Monounsaturated fats. ? Polyunsaturated fats. ? Omega-3 fats.  Choose grain products that have whole grains. Look for the word "whole" as the first word in the ingredient list. Cooking  Cook foods using low-fat methods. These include baking, boiling, grilling, and broiling.  Eat more home-cooked foods. Eat at restaurants and buffets  less often.  Avoid cooking using saturated fats, such as butter, cream, palm oil, palm kernel oil, and coconut oil. Recommended foods  Fruits  All fresh, canned (in natural juice), or frozen fruits. Vegetables  Fresh or frozen vegetables (raw, steamed, roasted, or grilled). Green salads. Grains  Whole grains, such as whole wheat or whole grain breads, crackers, cereals, and pasta. Unsweetened oatmeal, bulgur, barley, quinoa, or brown rice. Corn or whole wheat flour tortillas. Meats and other protein foods  Ground beef (85% or leaner), grass-fed beef, or beef trimmed of fat. Skinless chicken or turkey. Ground chicken or turkey. Pork trimmed of fat. All fish and seafood. Egg whites. Dried beans, peas, or lentils. Unsalted nuts or seeds. Unsalted canned beans. Nut butters without added sugar or oil. Dairy  Low-fat or nonfat dairy products, such as skim or 1% milk, 2% or reduced-fat cheeses, low-fat and fat-free ricotta or cottage cheese, or plain low-fat and nonfat yogurt. Fats and oils  Tub margarine without trans fats. Light or reduced-fat mayonnaise and salad dressings. Avocado. Olive, canola, sesame, or safflower oils. The items listed above may not be a complete list of foods and beverages you can eat. Contact a dietitian for more information. Foods to avoid Fruits  Canned fruit in heavy syrup. Fruit in cream or butter sauce. Fried fruit. Vegetables  Vegetables cooked in cheese, cream, or butter sauce. Fried vegetables. Grains  White bread. White pasta. White rice. Cornbread. Bagels, pastries, and croissants. Crackers and snack foods that contain trans fat   and hydrogenated oils. Meats and other protein foods  Fatty cuts of meat. Ribs, chicken wings, bacon, sausage, bologna, salami, chitterlings, fatback, hot dogs, bratwurst, and packaged lunch meats. Liver and organ meats. Whole eggs and egg yolks. Chicken and turkey with skin. Fried meat. Dairy  Whole or 2% milk, cream,  half-and-half, and cream cheese. Whole milk cheeses. Whole-fat or sweetened yogurt. Full-fat cheeses. Nondairy creamers and whipped toppings. Processed cheese, cheese spreads, and cheese curds. Beverages  Alcohol. Sugar-sweetened drinks such as sodas, lemonade, and fruit drinks. Fats and oils  Butter, stick margarine, lard, shortening, ghee, or bacon fat. Coconut, palm kernel, and palm oils. Sweets and desserts  Corn syrup, sugars, honey, and molasses. Candy. Jam and jelly. Syrup. Sweetened cereals. Cookies, pies, cakes, donuts, muffins, and ice cream. The items listed above may not be a complete list of foods and beverages you should avoid. Contact a dietitian for more information. Summary  Choosing the right foods helps keep your fat and cholesterol at normal levels. This can keep you from getting certain diseases.  At meals, fill one-half of your plate with vegetables and green salads.  Eat high-fiber foods, like whole grains, beans, apples, carrots, peas, and barley.  Limit added sugar, saturated fats, alcohol, and fried foods. This information is not intended to replace advice given to you by your health care provider. Make sure you discuss any questions you have with your health care provider. Document Released: 03/27/2012 Document Revised: 05/30/2018 Document Reviewed: 06/13/2017 Elsevier Interactive Patient Education  2019 Elsevier Inc.   

## 2019-01-29 MED ORDER — PRAVASTATIN SODIUM 10 MG PO TABS: 10.0000 mg | ORAL_TABLET | Freq: Every day | ORAL | 1 refills | Status: DC

## 2019-01-29 NOTE — Telephone Encounter (Signed)
Rx sent through e-scribe  

## 2019-01-29 NOTE — Addendum Note (Signed)
Addended by: Lurlean Nanny on: 01/29/2019 11:18 AM   Modules accepted: Orders

## 2019-03-26 ENCOUNTER — Other Ambulatory Visit: Payer: BC Managed Care – PPO

## 2019-03-26 ENCOUNTER — Ambulatory Visit (INDEPENDENT_AMBULATORY_CARE_PROVIDER_SITE_OTHER): Payer: BC Managed Care – PPO | Admitting: Internal Medicine

## 2019-03-26 ENCOUNTER — Encounter: Payer: Self-pay | Admitting: Internal Medicine

## 2019-03-26 ENCOUNTER — Other Ambulatory Visit: Payer: Self-pay

## 2019-03-26 ENCOUNTER — Telehealth: Payer: Self-pay | Admitting: *Deleted

## 2019-03-26 DIAGNOSIS — S30860A Insect bite (nonvenomous) of lower back and pelvis, initial encounter: Secondary | ICD-10-CM

## 2019-03-26 DIAGNOSIS — R509 Fever, unspecified: Secondary | ICD-10-CM | POA: Diagnosis not present

## 2019-03-26 DIAGNOSIS — R52 Pain, unspecified: Secondary | ICD-10-CM

## 2019-03-26 DIAGNOSIS — R51 Headache: Secondary | ICD-10-CM

## 2019-03-26 DIAGNOSIS — Z20822 Contact with and (suspected) exposure to covid-19: Secondary | ICD-10-CM

## 2019-03-26 DIAGNOSIS — R519 Headache, unspecified: Secondary | ICD-10-CM

## 2019-03-26 DIAGNOSIS — R6889 Other general symptoms and signs: Secondary | ICD-10-CM | POA: Diagnosis not present

## 2019-03-26 DIAGNOSIS — W57XXXA Bitten or stung by nonvenomous insect and other nonvenomous arthropods, initial encounter: Secondary | ICD-10-CM

## 2019-03-26 NOTE — Progress Notes (Addendum)
Virtual Visit via Video Note  I connected with Mallory [name] on 03/26/19 at 10:30 AM EDT by a video enabled telemedicine application and verified that I am speaking with the correct person using two identifiers.  Location: Patient: Home Provider: Office   I discussed the limitations of evaluation and management by telemedicine and the availability of in person appointments. The patient expressed understanding and agreed to proceed.  History of Present Illness:  Pt reports headache, fever, chills and body aches.  She reports this started yesterday.  The headache is located in her forehead.  She describes the pain as achy, pressure.  She denies dizziness or visual changes.  She denies runny nose, nasal condition, ear pain, sore throat, cough or shortness of breath.  She did find a tick on her back 4 to 5 days ago.  She reports ports the tick was engorged when she pulled the tick off of her.  She has not noticed any rash.  She has taken ibuprofen for her symptoms with some relief.  She has not had any sick contacts that she is aware of.    Past Medical History:  Diagnosis Date  . GERD (gastroesophageal reflux disease)   . HLD (hyperlipidemia)   . IBS (irritable bowel syndrome)   . Osteoarthritis of knee    Right  . Pulmonary embolism ([place])    5 yrs ago (unknown region)    Current Outpatient Medications  Medication Sig Dispense Refill  . acetaminophen (TYLENOL) 500 MG tablet Take 500 mg by mouth every 8 (eight) hours as needed for mild pain or moderate pain.    . hyoscyamine (LEVSIN SL) 0.125 MG SL tablet Place 1 tablet (0.125 mg total) under the tongue every 4 (four) hours as needed (abdominal cramping). 60 tablet 6  . ibuprofen (ADVIL,MOTRIN) 200 MG tablet Take 400 mg by mouth daily as needed for mild pain or moderate pain. Pain    . pantoprazole (PROTONIX) 20 MG tablet Take 1 tablet (20 mg total) by mouth 2 (two) times daily. 180 tablet 3  . pravastatin (PRAVACHOL) 10 MG tablet  Take 1 tablet (10 mg total) by mouth daily. 90 tablet 1   No current facility-administered medications for this visit.     Allergies  Allergen Reactions  . Promethazine Hcl Hives  . Hydromorphone Itching  . Macrolides And Ketolides Itching  . Nitrofurantoin Monohyd Macro Itching    Family History  Problem Relation Age of Onset  . Diabetes Mother   . Heart failure Father   . Diabetes Brother   . Heart disease Sister   . Heart disease Maternal Grandmother   . Cancer Maternal Grandfather        ? pancreatic or lung  . Anesthesia problems Neg Hx   . Hypotension Neg Hx   . Malignant hyperthermia Neg Hx   . Pseudochol deficiency Neg Hx   . Stroke Neg Hx     Social History   Socioeconomic History  . Marital status: Single    Spouse name: Not on file  . Number of children: 2  . Years of education: Not on file  . Highest education level: Not on file  Occupational History  . Occupation: Museum/gallery exhibitions officer: [employer]  Social Needs  . Financial resource strain: Not on file  . Food insecurity    Worry: Not on file    Inability: Not on file  . Transportation needs    Medical: Not on file    Non-medical:  Not on file  Tobacco Use  . Smoking status: Never Smoker  . Smokeless tobacco: Never Used  Substance and Sexual Activity  . Alcohol use: Yes    Alcohol/week: 0.0 standard drinks    Comment: occasional wine  . Drug use: No  . Sexual activity: Yes    Birth control/protection: Post-menopausal  Lifestyle  . Physical activity    Days per week: Not on file    Minutes per session: Not on file  . Stress: Not on file  Relationships  . Social Herbalist on phone: Not on file    Gets together: Not on file    Attends religious service: Not on file    Active member of club or organization: Not on file    Attends meetings of clubs or organizations: Not on file    Relationship status: Not on file  . Intimate partner violence    Fear of current or ex  partner: Not on file    Emotionally abused: Not on file    Physically abused: Not on file    Forced sexual activity: Not on file  Other Topics Concern  . Not on file  Social History Narrative   Lives w/ 28 yr old daughter.  2 daughters           Constitutional: Patient reports headache, fever, chills and body aches.  Denies fever, or abrupt weight changes.  HEENT: Denies eye pain, eye redness, ear pain, ringing in the ears, wax buildup, runny nose, nasal congestion, bloody nose, or sore throat. Respiratory: Denies difficulty breathing, shortness of breath, cough or sputum production.   Cardiovascular: Denies chest pain, chest tightness, palpitations or swelling in the hands or feet.  Gastrointestinal: Denies abdominal pain, bloating, constipation, diarrhea or blood in the stool.  Skin: Denies redness, rashes, lesions or ulcercations.  Neurological: Denies dizziness, difficulty with memory, difficulty with speech or problems with balance and coordination.    No other specific complaints in a complete review of systems (except as listed in HPI above).  LMP 05/11/2011  Wt Readings from Last 3 Encounters:  09/10/18 188 lb (85.3 kg)  07/04/18 187 lb 2 oz (84.9 kg)  01/30/18 181 lb (82.1 kg)    General: Appears her stated age, well developed, well nourished in NAD. Skin: Warm, dry and intact. No rashes noted. HEENT: Head: normal shape and size; Eyes: sclera white and EOMs intact;  Pulmonary/Chest: Normal effort. No respiratory distress.  Neurological: Alert and oriented.    BMET    Component Value Date/Time   NA 140 01/25/2019 1237   K 4.5 01/25/2019 1237   CL 103 01/25/2019 1237   CO2 29 01/25/2019 1237   GLUCOSE 97 01/25/2019 1237   BUN 15 01/25/2019 1237   CREATININE 0.78 01/25/2019 1237   CREATININE 0.85 11/03/2011 1205   CALCIUM 9.9 01/25/2019 1237   GFRNONAA >60 09/14/2017 0508   GFRAA >60 09/14/2017 0508    Lipid Panel     Component Value Date/Time   CHOL 189  01/25/2019 1237   TRIG 119.0 01/25/2019 1237   HDL 43.40 01/25/2019 1237   CHOLHDL 4 01/25/2019 1237   VLDL 23.8 01/25/2019 1237   LDLCALC 122 (H) 01/25/2019 1237    CBC    Component Value Date/Time   WBC 7.9 01/25/2019 1237   RBC 5.73 (H) 01/25/2019 1237   HGB 16.5 (H) 01/25/2019 1237   HCT 49.1 (H) 01/25/2019 1237   PLT 212.0 01/25/2019 1237   MCV 85.7  01/25/2019 1237   MCH 27.6 09/14/2017 0508   MCHC 33.6 01/25/2019 1237   RDW 14.0 01/25/2019 1237   LYMPHSABS 2.6 03/25/2016 1036   MONOABS 0.7 03/25/2016 1036   EOSABS 0.2 03/25/2016 1036   BASOSABS 0.0 03/25/2016 1036    Hgb A1C Lab Results  Component Value Date   HGBA1C 6.3 12/23/2014        Assessment and Plan:  Acute Headache, Fever and Chills, Body Aches, Tick Bite:  Advised her to get some rest and drink plenty of fluids We will have her proceed to a [facility] drive up test site for Novel SARS 2 Coronavirus testing Discussed gust the importance of wearing her mask, washing her hands Discussed the portance of self quarantine until test results are back Advised her to continue Tylenol or ibuprofen over-the-counter as needed for fever and body aches If coronavirus test is negative, consider labs for tickborne illness  We will follow-up after her results are back, return precautions discussed. Follow Up Instructions:    I discussed the assessment and treatment plan with the patient. The patient was provided an opportunity to ask questions and all were answered. The patient agreed with the plan and demonstrated an understanding of the instructions.   The patient was advised to call back or seek an in-person evaluation if the symptoms worsen or if the condition fails to improve as anticipated.     Webb Silversmith, NP

## 2019-03-26 NOTE — Addendum Note (Signed)
Addended by: Marijo Conception on: 03/26/2019 11:01 AM   Modules accepted: Orders

## 2019-03-26 NOTE — Patient Instructions (Signed)

## 2019-03-26 NOTE — Telephone Encounter (Signed)
I called and scheduled pt for COVID-19 testing at the [facility] location in [place] for today at 1145.  Referred by Webb Silversmith, NP  At [facility] office for fever, chills, body aches and headache.  I made her aware to stay in the car and wear a mask.  Order entered  Insur:  [employer] PPO

## 2019-03-29 ENCOUNTER — Other Ambulatory Visit: Payer: Self-pay | Admitting: Internal Medicine

## 2019-03-29 ENCOUNTER — Telehealth: Payer: Self-pay | Admitting: Internal Medicine

## 2019-03-29 DIAGNOSIS — W57XXXA Bitten or stung by nonvenomous insect and other nonvenomous arthropods, initial encounter: Secondary | ICD-10-CM

## 2019-03-29 LAB — NOVEL CORONAVIRUS, NAA: SARS-CoV-2, NAA: NOT DETECTED

## 2019-03-29 NOTE — Telephone Encounter (Signed)
OK for note saying she is negative for COVID and can go back to work. Yes, she needs a lab only appt

## 2019-03-29 NOTE — Telephone Encounter (Signed)
Best number [phone] Pt called stating her covid test was not detected.  (negative)  She needs a letter stating her test was neg so her spouse can go back to work.   Also she stated you wanted to do  more testing if covid come back neg.  Does pt need lab or virtual appointment

## 2019-04-01 ENCOUNTER — Other Ambulatory Visit (INDEPENDENT_AMBULATORY_CARE_PROVIDER_SITE_OTHER): Payer: BC Managed Care – PPO

## 2019-04-01 DIAGNOSIS — W57XXXA Bitten or stung by nonvenomous insect and other nonvenomous arthropods, initial encounter: Secondary | ICD-10-CM | POA: Diagnosis not present

## 2019-04-01 DIAGNOSIS — S30860A Insect bite (nonvenomous) of lower back and pelvis, initial encounter: Secondary | ICD-10-CM | POA: Diagnosis not present

## 2019-04-01 DIAGNOSIS — R52 Pain, unspecified: Secondary | ICD-10-CM

## 2019-04-01 DIAGNOSIS — R509 Fever, unspecified: Secondary | ICD-10-CM | POA: Diagnosis not present

## 2019-04-01 NOTE — Telephone Encounter (Signed)
Letter placed in the front office for pick up and lab only appt scheduled

## 2019-04-01 NOTE — Telephone Encounter (Signed)
Letter placed in the lab

## 2019-04-03 LAB — B. BURGDORFI ANTIBODIES BY WB
B burgdorferi IgG Abs (IB): NEGATIVE
B burgdorferi IgM Abs (IB): NEGATIVE
Lyme Disease 18 kD IgG: REACTIVE — AB
Lyme Disease 23 kD IgG: NONREACTIVE
Lyme Disease 23 kD IgM: NONREACTIVE
Lyme Disease 28 kD IgG: NONREACTIVE
Lyme Disease 30 kD IgG: NONREACTIVE
Lyme Disease 39 kD IgG: NONREACTIVE
Lyme Disease 39 kD IgM: NONREACTIVE
Lyme Disease 41 kD IgG: NONREACTIVE
Lyme Disease 41 kD IgM: NONREACTIVE
Lyme Disease 45 kD IgG: NONREACTIVE
Lyme Disease 58 kD IgG: NONREACTIVE
Lyme Disease 66 kD IgG: NONREACTIVE
Lyme Disease 93 kD IgG: NONREACTIVE

## 2019-04-03 LAB — REFLEX RMSF IGG TITER: RMSF IgG Titer: 1:1024 {titer} — ABNORMAL HIGH

## 2019-04-03 LAB — EHRLICHIA ANTIBODY PANEL
E. CHAFFEENSIS AB IGG: <1:64 {titer}
E. CHAFFEENSIS AB IGM: <1:20 {titer}

## 2019-04-03 LAB — ROCKY MTN SPOTTED FVR ABS PNL(IGG+IGM)
RMSF IgG: DETECTED — AB
RMSF IgM: NOT DETECTED

## 2019-04-15 ENCOUNTER — Other Ambulatory Visit: Payer: Self-pay | Admitting: Internal Medicine

## 2019-04-15 DIAGNOSIS — K219 Gastro-esophageal reflux disease without esophagitis: Secondary | ICD-10-CM

## 2019-07-04 ENCOUNTER — Other Ambulatory Visit: Payer: Self-pay | Admitting: Internal Medicine

## 2019-07-04 DIAGNOSIS — K219 Gastro-esophageal reflux disease without esophagitis: Secondary | ICD-10-CM

## 2019-08-14 ENCOUNTER — Ambulatory Visit (INDEPENDENT_AMBULATORY_CARE_PROVIDER_SITE_OTHER): Payer: BC Managed Care – PPO | Admitting: Gastroenterology

## 2019-08-14 ENCOUNTER — Other Ambulatory Visit: Payer: Self-pay

## 2019-08-14 ENCOUNTER — Encounter: Payer: Self-pay | Admitting: Gastroenterology

## 2019-08-14 VITALS — BP 126/80 | HR 72 | Temp 98.0°F | Ht 63.0 in | Wt 177.8 lb

## 2019-08-14 DIAGNOSIS — K219 Gastro-esophageal reflux disease without esophagitis: Secondary | ICD-10-CM | POA: Diagnosis not present

## 2019-08-14 DIAGNOSIS — Z1231 Encounter for screening mammogram for malignant neoplasm of breast: Secondary | ICD-10-CM | POA: Diagnosis not present

## 2019-08-14 DIAGNOSIS — Z01419 Encounter for gynecological examination (general) (routine) without abnormal findings: Secondary | ICD-10-CM | POA: Diagnosis not present

## 2019-08-14 DIAGNOSIS — Z6830 Body mass index (BMI) 30.0-30.9, adult: Secondary | ICD-10-CM | POA: Diagnosis not present

## 2019-08-14 DIAGNOSIS — K589 Irritable bowel syndrome without diarrhea: Secondary | ICD-10-CM

## 2019-08-14 NOTE — Patient Instructions (Signed)
Take your pantoprazole 20 mg 2 tablets by mouth before breakfast.  Start over the counter Pepcid 20 mg one tablet by mouth at bedtime.   Call back in 6-8 weeks with an update on your symptoms.

## 2019-08-14 NOTE — Progress Notes (Signed)
Previous GI testing: 08/2011 ERCPwith Dr. Laural Golden, for right upper quadrant pain, elevated liver tests. ERCP was normal. He performed biliary sphincterotomy for possiblesphincter of Oddidysfunction. Complicated by post procedure pains, event. 11/2011 EGDwith Dr. Laural Golden for epigastric pain, right upper quadrant pain. Some small polyps were removed from the stomach that were hyperplastic on pathology. The examination was otherwise normal 01/2012 endoscopic ultrasound,Dr. Ardis Hughs, referred by Dr. Jearld Adjutant for evaluation of persistent right upper quadrant pain. The examination was normal. 08/2012 ColonoscopyDr. Laural Golden, for screening, persistent abdominal pain, essentially normal. 2015 CT scan abdomenfor abdominal pain was essentially normal Labs 12/2014 cbc, cmet normal LFTs 2012-2016persistently normal except mild elevated in single trnasaminase  1. IBS -like pains: Lower abdominal pains, crampy, almost always improved with sublingual antispasmodics.   HPI: This is a very pleasant 63 year old woman whom I last saw a little over a year ago for antispasmodic medicine refills.  She has chronic GERD.  She points to ERCP in 2012 as the day her GERD symptoms started.  She has been on proton pump inhibitor, Protonix, 20 mg twice daily for many years.  She takes her first pill shortly before breakfast and her second pill sometime in the afternoon.  She has felt pyrosis, heartburn worse lately.  No true dysphagia however she does have some mild sore throat or globus type sensation.  She has not been losing weight.  She has had acid burning in her chest and up into her mouth even.  This is happened twice in the past few months.  Once in the middle the night and once during the day.  She points to caffeinated beverages and chocolates as definite offenders for her heartburn.  She does not drink alcohol does not smoke cigarettes.  No troubles with her bowels.  Her IBS symptoms seem to be under good  control  Review of systems: Pertinent positive and negative review of systems were noted in the above HPI section. All other review negative.   Past Medical History:  Diagnosis Date  . GERD (gastroesophageal reflux disease)   . HLD (hyperlipidemia)   . IBS (irritable bowel syndrome)   . Osteoarthritis of knee    Right  . Pulmonary embolism ([place])    5 yrs ago (unknown region)    Past Surgical History:  Procedure Laterality Date  . [place]   mc  . CHOLECYSTECTOMY  15 yrs ago   mc  . COLONOSCOPY  05/24/2012   Procedure: COLONOSCOPY;  Surgeon: Rogene Houston, MD;  Location: [facility];  Service: Endoscopy;  Laterality: N/A;  220  . ERCP  08/12/2011   Procedure: ENDOSCOPIC RETROGRADE CHOLANGIOPANCREATOGRAPHY (ERCP);  Surgeon: Rogene Houston, MD;  Location: [facility];  Service: Endoscopy;  Laterality: N/A;  . ERCP W/ SPHICTEROTOMY  08/12/11   Dr Rehman-?microlithiasis, SOD  . ESOPHAGOGASTRODUODENOSCOPY  11/25/2011   Procedure: ESOPHAGOGASTRODUODENOSCOPY (EGD);  Surgeon: Rogene Houston, MD;  Location: [facility];  Service: Endoscopy;  Laterality: N/A;  830  . EUS  01/19/2012   Procedure: UPPER ENDOSCOPIC ULTRASOUND (EUS) LINEAR;  Surgeon: Milus Banister, MD;  Location: [facility];  Service: Endoscopy;  Laterality: N/A;  pt moved up a hour early by AW , Patty @ [place] office ok'd / pt was called by AW  . SPHINCTEROTOMY  08/12/2011   Procedure: SPHINCTEROTOMY;  Surgeon: Rogene Houston, MD;  Location: [facility];  Service: Endoscopy;;  with ballon passage  . TOTAL KNEE ARTHROPLASTY Right 09/13/2017  . TOTAL KNEE ARTHROPLASTY Right  09/13/2017   Procedure: RIGHT TOTAL KNEE ARTHROPLASTY;  Surgeon: Mcarthur Rossetti, MD;  Location: [facility];  Service: Orthopedics;  Laterality: Right;  . TUBAL LIGATION      Current Outpatient Medications  Medication Sig Dispense Refill  . acetaminophen (TYLENOL) 500 MG tablet Take 500 mg by mouth every 8 (eight) hours as needed for mild  pain or moderate pain.    . hyoscyamine (LEVSIN SL) 0.125 MG SL tablet Place 1 tablet (0.125 mg total) under the tongue every 4 (four) hours as needed (abdominal cramping). 60 tablet 6  . ibuprofen (ADVIL,MOTRIN) 200 MG tablet Take 400 mg by mouth daily as needed for mild pain or moderate pain. Pain    . pantoprazole (PROTONIX) 20 MG tablet TAKE ONE TABLET BY MOUTH 2 TIMES A DAY. 60 tablet 0  . pravastatin (PRAVACHOL) 10 MG tablet Take 1 tablet (10 mg total) by mouth daily. MUST SCHEDULE PHYSICAL EXAM 90 tablet 0   No current facility-administered medications for this visit.     Allergies as of 08/14/2019 - Review Complete 08/14/2019  Allergen Reaction Noted  . Promethazine hcl Hives 08/14/2011  . Hydromorphone Itching 08/08/2011  . Macrolides and ketolides Itching 08/04/2014  . Nitrofurantoin monohyd macro Itching 01/19/2012    Family History  Problem Relation Age of Onset  . Diabetes Mother   . Heart failure Father   . Diabetes Brother   . Heart disease Sister   . Heart disease Maternal Grandmother   . Cancer Maternal Grandfather        ? pancreatic or lung  . Anesthesia problems Neg Hx   . Hypotension Neg Hx   . Malignant hyperthermia Neg Hx   . Pseudochol deficiency Neg Hx   . Stroke Neg Hx     Social History   Socioeconomic History  . Marital status: Married    Spouse name: Not on file  . Number of children: 2  . Years of education: Not on file  . Highest education level: Not on file  Occupational History  . Occupation: Museum/gallery exhibitions officer: [employer]  Social Needs  . Financial resource strain: Not on file  . Food insecurity    Worry: Not on file    Inability: Not on file  . Transportation needs    Medical: Not on file    Non-medical: Not on file  Tobacco Use  . Smoking status: Never Smoker  . Smokeless tobacco: Never Used  Substance and Sexual Activity  . Alcohol use: Yes    Alcohol/week: 0.0 standard drinks    Comment: occasional wine  .  Drug use: No  . Sexual activity: Yes    Birth control/protection: Post-menopausal  Lifestyle  . Physical activity    Days per week: Not on file    Minutes per session: Not on file  . Stress: Not on file  Relationships  . Social Herbalist on phone: Not on file    Gets together: Not on file    Attends religious service: Not on file    Active member of club or organization: Not on file    Attends meetings of clubs or organizations: Not on file    Relationship status: Not on file  . Intimate partner violence    Fear of current or ex partner: Not on file    Emotionally abused: Not on file    Physically abused: Not on file    Forced sexual activity: Not on file  Other Topics  Concern  . Not on file  Social History Narrative   Lives w/ 20 yr old daughter.  2 daughters           Physical Exam: BP 126/80   Pulse 72   Temp 98 F (36.7 C)   Ht 5\' 3"  (1.6 m)   Wt 177 lb 12.8 oz (80.6 kg)   LMP 05/11/2011   BMI 31.50 kg/m  Constitutional: generally well-appearing Psychiatric: alert and oriented x3 Eyes: extraocular movements intact Neck: supple no lymphadenopathy Cardiovascular: heart regular rate and rhythm Lungs: clear to auscultation bilaterally Abdomen: soft, nontender, nondistended, no obvious ascites, no peritoneal signs, normal bowel sounds Extremities: no lower extremity edema bilaterally Skin: no lesions on visible extremities   Assessment and plan: 63 y.o. female with IBS, chronic GERD  Her irritable bowel symptoms have been under good control and she will continue taking her antispasmodic medicines on an as-needed basis.  GERD symptoms have worsened lately.  No alarm symptoms fortunately.  I recommended that she change her proton pump inhibitor so that she is taking 40 mg of Protonix shortly before breakfast meal every day.  I am also going to have her start an H2 blocker Pepcid 20 mg at bedtime every night.  She will call in 6 or 8 weeks to report on  her response to these medicine changes.   Please see the "Patient Instructions" section for addition details about the plan.   Owens Loffler, MD [facility] 08/14/2019, 9:09 AM  Cc: Jearld Fenton, NP

## 2019-10-07 ENCOUNTER — Other Ambulatory Visit: Payer: Self-pay | Admitting: Internal Medicine

## 2019-10-07 DIAGNOSIS — K219 Gastro-esophageal reflux disease without esophagitis: Secondary | ICD-10-CM

## 2019-11-08 ENCOUNTER — Other Ambulatory Visit: Payer: Self-pay | Admitting: Internal Medicine

## 2019-11-08 ENCOUNTER — Other Ambulatory Visit: Payer: Self-pay | Admitting: Gastroenterology

## 2019-11-08 DIAGNOSIS — K219 Gastro-esophageal reflux disease without esophagitis: Secondary | ICD-10-CM

## 2019-12-20 ENCOUNTER — Ambulatory Visit (INDEPENDENT_AMBULATORY_CARE_PROVIDER_SITE_OTHER): Payer: BC Managed Care – PPO | Admitting: Internal Medicine

## 2019-12-20 ENCOUNTER — Encounter: Payer: Self-pay | Admitting: Internal Medicine

## 2019-12-20 ENCOUNTER — Other Ambulatory Visit: Payer: Self-pay

## 2019-12-20 VITALS — BP 120/80 | HR 72 | Temp 97.5°F | Ht 64.25 in | Wt 184.0 lb

## 2019-12-20 DIAGNOSIS — M1711 Unilateral primary osteoarthritis, right knee: Secondary | ICD-10-CM

## 2019-12-20 DIAGNOSIS — E7800 Pure hypercholesterolemia, unspecified: Secondary | ICD-10-CM

## 2019-12-20 DIAGNOSIS — K589 Irritable bowel syndrome without diarrhea: Secondary | ICD-10-CM

## 2019-12-20 DIAGNOSIS — E559 Vitamin D deficiency, unspecified: Secondary | ICD-10-CM | POA: Diagnosis not present

## 2019-12-20 DIAGNOSIS — Z0000 Encounter for general adult medical examination without abnormal findings: Secondary | ICD-10-CM | POA: Diagnosis not present

## 2019-12-20 DIAGNOSIS — K219 Gastro-esophageal reflux disease without esophagitis: Secondary | ICD-10-CM | POA: Diagnosis not present

## 2019-12-20 NOTE — Assessment & Plan Note (Signed)
Continue Pantoprazole CBC and CMET today Improve with weight loss prior, she will work on this.

## 2019-12-20 NOTE — Patient Instructions (Signed)

## 2019-12-20 NOTE — Assessment & Plan Note (Signed)
CMET and Lipid profile today Encouraged her to consume a low fat diet Continue Pravastatin 

## 2019-12-20 NOTE — Progress Notes (Signed)
Subjective:    Patient ID: Mallory [name], female    DOB: [birth year], 64 y.o.   MRN: [id number]  HPI  Pt presents to the clinic today for her annual exam. She is also due to follow up chronic conditions.  GERD: She denies breakthrough on Pantoprazole since it was increased by Dr. Ardis Hughs. Upper GI from 11/2011 reviewed.  HLD: Her last LDL was 122, 01/2019. She denies myalgias on Pravastatin. She tries to consume a low fat diet.  IBS: Alternating constipation and diarrhea. She takes Hyoscyamine as needed with good relief.  OA: Mainly in her knees. She takes Ibuprofen or Tylenol as needed with good relief.  Flu: 07/2019 Tetanus: 12/2014 Zostovax: never Shingrix: never Covid: never Pap Smear: 07/2018 Mammogram: 07/2019 Bone Density: 4-5 years ago Colon Screening: 2013, 10 years Vision Screening: annually Dentist: as needed  Diet: She does eat meat. She consumes fruits and veggies daily. She tries to avoid fried foods. She drinks mostly water, Coke Zero. Exercise: None  Review of Systems      Past Medical History:  Diagnosis Date  . GERD (gastroesophageal reflux disease)   . HLD (hyperlipidemia)   . IBS (irritable bowel syndrome)   . Osteoarthritis of knee    Right  . Pulmonary embolism ([place])    5 yrs ago (unknown region)    Current Outpatient Medications  Medication Sig Dispense Refill  . acetaminophen (TYLENOL) 500 MG tablet Take 500 mg by mouth every 8 (eight) hours as needed for mild pain or moderate pain.    . hyoscyamine (LEVSIN SL) 0.125 MG SL tablet TAKE ONE TABLET DAILY EVERY 4 HOURS AS NEEDED FOR CRAMPING. 60 tablet 11  . ibuprofen (ADVIL,MOTRIN) 200 MG tablet Take 400 mg by mouth daily as needed for mild pain or moderate pain. Pain    . pantoprazole (PROTONIX) 20 MG tablet TAKE ONE TABLET BY MOUTH 2 TIMES A DAY. 60 tablet 11  . pravastatin (PRAVACHOL) 10 MG tablet Take 1 tablet (10 mg total) by mouth daily. MUST SCHEDULE PHYSICAL FOR REFILLS 30 tablet 0    No current facility-administered medications for this visit.    Allergies  Allergen Reactions  . Promethazine Hcl Hives  . Hydromorphone Itching  . Macrolides And Ketolides Itching  . Nitrofurantoin Monohyd Macro Itching    Family History  Problem Relation Age of Onset  . Diabetes Mother   . Heart failure Father   . Diabetes Brother   . Heart disease Sister   . Heart disease Maternal Grandmother   . Cancer Maternal Grandfather        ? pancreatic or lung  . Anesthesia problems Neg Hx   . Hypotension Neg Hx   . Malignant hyperthermia Neg Hx   . Pseudochol deficiency Neg Hx   . Stroke Neg Hx     Social History   Socioeconomic History  . Marital status: Married    Spouse name: Not on file  . Number of children: 2  . Years of education: Not on file  . Highest education level: Not on file  Occupational History  . Occupation: Museum/gallery exhibitions officer: [employer]  Tobacco Use  . Smoking status: Never Smoker  . Smokeless tobacco: Never Used  Substance and Sexual Activity  . Alcohol use: Yes    Alcohol/week: 0.0 standard drinks    Comment: occasional wine  . Drug use: No  . Sexual activity: Yes    Birth control/protection: Post-menopausal  Other Topics Concern  . Not  on file  Social History Narrative   Lives w/ 47 yr old daughter.  2 daughters         Social Determinants of Health   Financial Resource Strain:   . Difficulty of Paying Living Expenses:   Food Insecurity:   . Worried About Charity fundraiser in the Last Year:   . Arboriculturist in the Last Year:   Transportation Needs:   . Film/video editor (Medical):   Marland Kitchen Lack of Transportation (Non-Medical):   Physical Activity:   . Days of Exercise per Week:   . Minutes of Exercise per Session:   Stress:   . Feeling of Stress :   Social Connections:   . Frequency of Communication with Friends and Family:   . Frequency of Social Gatherings with Friends and Family:   . Attends Religious  Services:   . Active Member of Clubs or Organizations:   . Attends Archivist Meetings:   Marland Kitchen Marital Status:   Intimate Partner Violence:   . Fear of Current or Ex-Partner:   . Emotionally Abused:   Marland Kitchen Physically Abused:   . Sexually Abused:      Constitutional: Denies fever, malaise, fatigue, headache or abrupt weight changes.  HEENT: Denies eye pain, eye redness, ear pain, ringing in the ears, wax buildup, runny nose, nasal congestion, bloody nose, or sore throat. Respiratory: Denies difficulty breathing, shortness of breath, cough or sputum production.   Cardiovascular: Denies chest pain, chest tightness, palpitations or swelling in the hands or feet.  Gastrointestinal: Pt reports alternating constipation and diarrhea, intermittent LLQ pain. Denies abdominal pain, bloating, or blood in the stool.  GU: Denies urgency, frequency, pain with urination, burning sensation, blood in urine, odor or discharge. Musculoskeletal: Pt reports intermittent knee pain .Denies decrease in range of motion, difficulty with gait, muscle pain or joint swelling.  Skin: Denies redness, rashes, lesions or ulcercations.  Neurological: Denies dizziness, difficulty with memory, difficulty with speech or problems with balance and coordination.  Psych: Denies anxiety, depression, SI/HI.  No other specific complaints in a complete review of systems (except as listed in HPI above).  Objective:   Physical Exam BP 120/80   Pulse 72   Temp (!) 97.5 F (36.4 C) (Temporal)   Ht 5' 4.25" (1.632 m)   Wt 184 lb (83.5 kg)   LMP 05/11/2011   SpO2 98%   BMI 31.34 kg/m    Wt Readings from Last 3 Encounters:  08/14/19 177 lb 12.8 oz (80.6 kg)  09/10/18 188 lb (85.3 kg)  07/04/18 187 lb 2 oz (84.9 kg)    General: Appears her stated age, obese, in NAD. Skin: Warm, dry and intact. No rashes, lesions or ulcerations noted. HEENT: Head: normal shape and size; Eyes: sclera white, no icterus, conjunctiva pink,  PERRLA and EOMs intact;  Neck:  Neck supple, trachea midline. No masses, lumps or thyromegaly present.  Cardiovascular: Normal rate and rhythm. S1,S2 noted.  No murmur, rubs or gallops noted. No JVD or BLE edema. No carotid bruits noted. Pulmonary/Chest: Normal effort and positive vesicular breath sounds. No respiratory distress. No wheezes, rales or ronchi noted.  Abdomen: Soft and nontender. Normal bowel sounds. No distention or masses noted. Liver, spleen and kidneys non palpable. Musculoskeletal:  Strength 5/5 BUE/BLE. No difficulty with gait.  Neurological: Alert and oriented. Cranial nerves II-XII grossly intact. Coordination normal.  Psychiatric: Mood and affect normal. Behavior is normal. Judgment and thought content normal.  BMET    Component Value Date/Time   NA 140 01/25/2019 1237   K 4.5 01/25/2019 1237   CL 103 01/25/2019 1237   CO2 29 01/25/2019 1237   GLUCOSE 97 01/25/2019 1237   BUN 15 01/25/2019 1237   CREATININE 0.78 01/25/2019 1237   CREATININE 0.85 11/03/2011 1205   CALCIUM 9.9 01/25/2019 1237   GFRNONAA >60 09/14/2017 0508   GFRAA >60 09/14/2017 0508    Lipid Panel     Component Value Date/Time   CHOL 189 01/25/2019 1237   TRIG 119.0 01/25/2019 1237   HDL 43.40 01/25/2019 1237   CHOLHDL 4 01/25/2019 1237   VLDL 23.8 01/25/2019 1237   LDLCALC 122 (H) 01/25/2019 1237    CBC    Component Value Date/Time   WBC 7.9 01/25/2019 1237   RBC 5.73 (H) 01/25/2019 1237   HGB 16.5 (H) 01/25/2019 1237   HCT 49.1 (H) 01/25/2019 1237   PLT 212.0 01/25/2019 1237   MCV 85.7 01/25/2019 1237   MCH 27.6 09/14/2017 0508   MCHC 33.6 01/25/2019 1237   RDW 14.0 01/25/2019 1237   LYMPHSABS 2.6 03/25/2016 1036   MONOABS 0.7 03/25/2016 1036   EOSABS 0.2 03/25/2016 1036   BASOSABS 0.0 03/25/2016 1036    Hgb A1C Lab Results  Component Value Date   HGBA1C 6.3 12/23/2014          Assessment & Plan:   Preventative Health Maintenance:  Flu shot UTD Tetanus  UTD Pap smear UTD Mammogram UTD Bone density UTD per her report Colon screening due 2023 Encouraged her to consume a balanced diet and exercise regimen Advised her to see an eye doctor and dentist annually Will check CBC, CMET, Lipid and Vit D today  RTC in 1 year, sooner if needed Webb Silversmith, NP This visit occurred during the SARS-CoV-2 public health emergency.  Safety protocols were in place, including screening questions prior to the visit, additional usage of staff PPE, and extensive cleaning of exam room while observing appropriate contact time as indicated for disinfecting solutions.

## 2019-12-20 NOTE — Assessment & Plan Note (Signed)
Improved with weight loss prior, she will work on this Continue Ibuprofen and Tylenol OTC

## 2019-12-20 NOTE — Assessment & Plan Note (Signed)
Continue Hyscyamine CBC and CMET today Will monitor

## 2019-12-21 LAB — COMPREHENSIVE METABOLIC PANEL
AG Ratio: 1.5 (calc) (ref 1.0–2.5)
ALT: 45 U/L — ABNORMAL HIGH (ref 6–29)
AST: 39 U/L — ABNORMAL HIGH (ref 10–35)
Albumin: 4.1 g/dL (ref 3.6–5.1)
Alkaline phosphatase (APISO): 108 U/L (ref 37–153)
BUN: 12 mg/dL (ref 7–25)
CO2: 26 mmol/L (ref 20–32)
Calcium: 9.5 mg/dL (ref 8.6–10.4)
Chloride: 103 mmol/L (ref 98–110)
Creat: 0.81 mg/dL (ref 0.50–0.99)
Globulin: 2.8 g/dL (calc) (ref 1.9–3.7)
Glucose, Bld: 105 mg/dL — ABNORMAL HIGH (ref 65–99)
Potassium: 4.3 mmol/L (ref 3.5–5.3)
Sodium: 141 mmol/L (ref 135–146)
Total Bilirubin: 0.4 mg/dL (ref 0.2–1.2)
Total Protein: 6.9 g/dL (ref 6.1–8.1)

## 2019-12-21 LAB — CBC
HCT: 49.8 % — ABNORMAL HIGH (ref 35.0–45.0)
Hemoglobin: 16.5 g/dL — ABNORMAL HIGH (ref 11.7–15.5)
MCH: 27.9 pg (ref 27.0–33.0)
MCHC: 33.1 g/dL (ref 32.0–36.0)
MCV: 84.1 fL (ref 80.0–100.0)
MPV: 9.7 fL (ref 7.5–12.5)
Platelets: 243 10*3/uL (ref 140–400)
RBC: 5.92 10*6/uL — ABNORMAL HIGH (ref 3.80–5.10)
RDW: 13.3 % (ref 11.0–15.0)
WBC: 8.7 10*3/uL (ref 3.8–10.8)

## 2019-12-21 LAB — LIPID PANEL
Cholesterol: 174 mg/dL (ref <200)
HDL: 42 mg/dL — ABNORMAL LOW (ref >=50)
LDL Cholesterol (Calc): 105 mg/dL (calc) — ABNORMAL HIGH
Non-HDL Cholesterol (Calc): 132 mg/dL (calc) — ABNORMAL HIGH (ref <130)
Total CHOL/HDL Ratio: 4.1 (calc) (ref <5.0)
Triglycerides: 154 mg/dL — ABNORMAL HIGH (ref <150)

## 2019-12-21 LAB — VITAMIN D 25 HYDROXY (VIT D DEFICIENCY, FRACTURES): Vit D, 25-Hydroxy: 42 ng/mL (ref 30–100)

## 2019-12-25 ENCOUNTER — Other Ambulatory Visit: Payer: Self-pay | Admitting: Internal Medicine

## 2020-03-02 ENCOUNTER — Other Ambulatory Visit: Payer: Self-pay

## 2020-03-02 ENCOUNTER — Ambulatory Visit (HOSPITAL_COMMUNITY)
Admission: RE | Admit: 2020-03-02 | Discharge: 2020-03-02 | Disposition: A | Discharge status: Home / Self Care | Payer: BC Managed Care – PPO | Source: Ambulatory Visit | Attending: Surgery | Admitting: Surgery

## 2020-03-02 ENCOUNTER — Telehealth: Payer: Self-pay | Admitting: Radiology

## 2020-03-02 ENCOUNTER — Telehealth: Payer: Self-pay

## 2020-03-02 DIAGNOSIS — Z96651 Presence of right artificial knee joint: Secondary | ICD-10-CM

## 2020-03-02 DIAGNOSIS — M79604 Pain in right leg: Secondary | ICD-10-CM | POA: Insufficient documentation

## 2020-03-02 NOTE — Telephone Encounter (Signed)
Patient left voicemail on triage line stating she is having some swelling in the same leg she had knee surgery on with aching up the leg. She states that she is prone to blood clots and would like to see Dr. Ninfa Linden as soon as possible.  From chart, it looks like patient was last seen in 2019 for follow up after total knee arthroplasty 09/13/2017.  Please advise on appointment.  CB for patient [phone]

## 2020-03-02 NOTE — Telephone Encounter (Signed)
Given her history of blood clots, it is reasonable to send her for an ultrasound to rule out a DVT.

## 2020-03-02 NOTE — Telephone Encounter (Signed)
FYI

## 2020-03-02 NOTE — Telephone Encounter (Signed)
[facility] called stating that patient is Negative for DVT, right LE.

## 2020-03-02 NOTE — Telephone Encounter (Signed)
Patient aware of appt today at [facility] at 2:45

## 2020-03-10 ENCOUNTER — Ambulatory Visit: Payer: BC Managed Care – PPO | Admitting: Orthopaedic Surgery

## 2020-09-02 DIAGNOSIS — Z01419 Encounter for gynecological examination (general) (routine) without abnormal findings: Secondary | ICD-10-CM | POA: Diagnosis not present

## 2020-09-02 DIAGNOSIS — Z6831 Body mass index (BMI) 31.0-31.9, adult: Secondary | ICD-10-CM | POA: Diagnosis not present

## 2020-09-02 DIAGNOSIS — Z1231 Encounter for screening mammogram for malignant neoplasm of breast: Secondary | ICD-10-CM | POA: Diagnosis not present

## 2020-09-02 DIAGNOSIS — Z23 Encounter for immunization: Secondary | ICD-10-CM | POA: Diagnosis not present

## 2020-09-02 LAB — HM MAMMOGRAPHY

## 2020-09-08 ENCOUNTER — Encounter: Payer: Self-pay | Admitting: Internal Medicine

## 2020-09-29 ENCOUNTER — Encounter: Payer: Self-pay | Admitting: Primary Care

## 2020-09-29 ENCOUNTER — Telehealth (INDEPENDENT_AMBULATORY_CARE_PROVIDER_SITE_OTHER): Payer: BC Managed Care – PPO | Admitting: Primary Care

## 2020-09-29 ENCOUNTER — Other Ambulatory Visit: Payer: BC Managed Care – PPO

## 2020-09-29 ENCOUNTER — Other Ambulatory Visit: Payer: Self-pay

## 2020-09-29 ENCOUNTER — Telehealth: Payer: Self-pay

## 2020-09-29 VITALS — Ht 64.25 in | Wt 184.0 lb

## 2020-09-29 DIAGNOSIS — J309 Allergic rhinitis, unspecified: Secondary | ICD-10-CM | POA: Diagnosis not present

## 2020-09-29 DIAGNOSIS — Z1152 Encounter for screening for COVID-19: Secondary | ICD-10-CM

## 2020-09-29 NOTE — Telephone Encounter (Signed)
[facility] - Client TELEPHONE ADVICE RECORD [facility] Patient Name: Mallory [name] Gender: Female DOB: 1955-11-12 Age: 64 Y 2 M 28 D Return Phone Number: [phone] (Primary) Address: [place] [address] Client [facility] - Client Client Site [place] - Day Physician Webb Silversmith - NP Contact Type Call Who Is Calling Patient / Member / Family / Caregiver Call Type Triage / Clinical Relationship To Patient Self Return Phone Number [phone] (Primary) Chief Complaint Runny or [place] Reason for Call Symptomatic / Request for [place] states that her nose is running and she has a cough. Unknown if she has a fever at this time. She has been vacinated as well Translation No Nurse Assessment Nurse: Wynetta Emery, RN, Baker Janus Date/Time Eilene Ghazi Time): 09/29/2020 10:39:33 AM Confirm and document reason for call. If symptomatic, describe symptoms. ---Karna Christmas has runny nose and cough eyes watering. pressure in head causing headache. no fever onset Saturday. Does the patient have any new or worsening symptoms? ---Yes Will a triage be completed? ---Yes Related visit to physician within the last 2 weeks? ---No Does the PT have any chronic conditions? (i.e. diabetes, asthma, this includes High risk factors for pregnancy, etc.) ---Yes List chronic conditions. ---IBS GERDS Hyperlipidemia Is this a behavioral health or substance abuse call? ---No Guidelines Guideline Title Affirmed Question Affirmed Notes Nurse Date/Time Eilene Ghazi Time) Sinus Pain or Congestion [1] Sinus pain (not just congestion) AND [2] fever Ivin Booty 09/29/2020 10:42:45 AM Disp. Time Eilene Ghazi Time) Disposition Final User 09/29/2020 10:46:54 AM See PCP within 24 Hours Yes Wynetta Emery, RN, Christin Bach Disagree/Comply Comply Caller Understands Yes PLEASE NOTE: All timestamps contained  within this report are represented as [place] Standard Time. CONFIDENTIALTY NOTICE: This fax transmission is intended only for the addressee. It contains information that is legally privileged, confidential or otherwise protected from use or disclosure. If you are not the intended recipient, you are strictly prohibited from reviewing, disclosing, copying using or disseminating any of this information or taking any action in reliance on or regarding this information. If you have received this fax in error, please notify us immediately by telephone so that we can arrange for its return to [place]. Phone: [phone], Toll-Free: [phone], Fax: [phone] Page: 2 of 2 Call Id: [id number] PreDisposition Call Doctor Care Advice Given Per Guideline * Introduction: Saline (salt water) nasal irrigation (nasal wash) is an effective and simple home remedy for treating stuffy nose and sinus congestion. The nose can be irrigated by pouring, spraying, or squirting salt water into the nose and then letting it run back out. * How it Helps: The salt water rinses out excess mucus and washes out any irritants (dust, allergens) that might be present. It also moistens the nasal cavity. * STEP 1: Lean over a sink. * STEP 2: Gently squirt or spray warm salt water into one of your nostrils. * STEP 3: Some of the water may run into the back of your throat. Spit this out. If you swallow the salt water it will not hurt you. * STEP 4: Blow your nose to clean out the water and mucus. * Put 1 cup (8 oz; 240 ml) of water in a clean container. * Add 3/4 teaspoon of non-iodized salt (such as canning or pickling salt) to the water. * Use distilled water or boiled tap water that has cooled. * Do not use these medicines if you have high blood pressure, heart disease,  prostate problems, or an overactive thyroid. * IBUPROFEN (E.G., MOTRIN, ADVIL): Take 400 mg (two 200 mg pills) by mouth every 6 hours. The most you should take each day  is 1,200 mg (six 200 mg pills), unless your doctor has told you to take more. * ACETAMINOPHEN - EXTRA STRENGTH TYLENOL: Take 1,000 mg (two 500 mg pills) every 8 hours as needed. Each Extra Strength Tylenol pill has 500 mg of acetaminophen. The most you should take each day is 3,000 mg (6 pills a day). * You become worse * Difficulty breathing (and not relieved by cleaning out nose) Referrals Warm transfer to backline

## 2020-09-29 NOTE — Telephone Encounter (Signed)
Patient evaluated.  

## 2020-09-29 NOTE — Assessment & Plan Note (Signed)
Acute symptoms, likely secondary to allergies, but need to rule out Covid-19, especially given the Christmas season and gatherings.   Covid-19 test pending. She doesn't appear acutely ill. Discussed use of daily Claritin, add Flonase.   Await results.  She will update if anything changes.

## 2020-09-29 NOTE — Progress Notes (Signed)
Subjective:    Patient ID: Mallory [name], female    DOB: 06/05/1956, 64 y.o.   MRN: [id number]  HPI  Virtual Visit via Video Note  I connected with Mallory [name] on 09/29/20 at  3:00 PM EST by a video enabled telemedicine application and verified that I am speaking with the correct person using two identifiers.  Location: Patient: Home Provider: Office Participants: Patient and myself   I discussed the limitations of evaluation and management by telemedicine and the availability of in person appointments. The patient expressed understanding and agreed to proceed.  History of Present Illness:  Mallory [name] is a 64 year old female patient of Webb Silversmith with a history of IBS, pulmonary embolus, GERD who presents today with a chief complaint of rhinorrhea.  Symptoms began 3-4 days ago with a "swimmy headed" sensation with flushing. The last two days she felt well without those symptoms, until this morning when she began to notice rhinorrhea, sneezing, watery eyes.   She denies fevers, sore throat, loss of taste/smell, diarrhea, known exposure to Covid-19. Her husband was recently exposed to a co-worker who was diagnosed with Covid-19 last week. He has no symptoms.  She's taken Claritin this morning, also took Ibuprofen because she felt a cold was coming on. She's had two Covid-19 vaccinations and her flu shot this season.    Observations/Objective:  Alert and oriented. Appears well, not sickly. No distress. Speaking in complete sentences.   Assessment and Plan:  Acute symptoms, likely secondary to allergies, but need to rule out Covid-19, especially given the Christmas season and gatherings.   Covid-19 test pending. She doesn't appear acutely ill. Discussed use of daily Claritin, add Flonase.   Await results.  She will update if anything changes.  Follow Up Instructions:  Complete your Covid-19 test this afternoon.  Continue Claritin daily for allergies. Nasal  Congestion/Ear Pressure/Sinus Pressure: Try using Flonase (fluticasone) nasal spray. Instill 1 spray in each nostril twice daily.   We will be in touch within a few days.  It was a pleasure meeting you! Allie Bossier, NP-C    I discussed the assessment and treatment plan with the patient. The patient was provided an opportunity to ask questions and all were answered. The patient agreed with the plan and demonstrated an understanding of the instructions.   The patient was advised to call back or seek an in-person evaluation if the symptoms worsen or if the condition fails to improve as anticipated.    Pleas Koch, NP    Review of Systems  Constitutional: Negative for chills, fatigue and fever.  HENT: Positive for rhinorrhea and sneezing. Negative for sore throat.   Eyes:       Watery eyes  Respiratory: Negative for cough and shortness of breath.   Gastrointestinal: Negative for diarrhea.  Allergic/Immunologic: Positive for environmental allergies.       Past Medical History:  Diagnosis Date  . GERD (gastroesophageal reflux disease)   . HLD (hyperlipidemia)   . IBS (irritable bowel syndrome)   . Osteoarthritis of knee    Right  . Pulmonary embolism ([place])    5 yrs ago (unknown region)     Social History   Socioeconomic History  . Marital status: Married    Spouse name: Not on file  . Number of children: 2  . Years of education: Not on file  . Highest education level: Not on file  Occupational History  . Occupation: Museum/gallery exhibitions officer:  [employer]  Tobacco Use  . Smoking status: Never Smoker  . Smokeless tobacco: Never Used  Vaping Use  . Vaping Use: Never used  Substance and Sexual Activity  . Alcohol use: Yes    Alcohol/week: 0.0 standard drinks    Comment: occasional wine  . Drug use: No  . Sexual activity: Yes    Birth control/protection: Post-menopausal  Other Topics Concern  . Not on file  Social History Narrative   Lives w/ 72 yr old  daughter.  2 daughters         Social Determinants of Radio broadcast assistant Strain: Not on file  Food Insecurity: Not on file  Transportation Needs: Not on file  Physical Activity: Not on file  Stress: Not on file  Social Connections: Not on file  Intimate Partner Violence: Not on file    Past Surgical History:  Procedure Laterality Date  . [place]   mc  . CHOLECYSTECTOMY  15 yrs ago   mc  . COLONOSCOPY  05/24/2012   Procedure: COLONOSCOPY;  Surgeon: Rogene Houston, MD;  [facility];  Service: Endoscopy;  Laterality: N/A;  220  . ERCP  08/12/2011   Procedure: ENDOSCOPIC RETROGRADE CHOLANGIOPANCREATOGRAPHY (ERCP);  Surgeon: Rogene Houston, MD;  Location: [facility];  Service: Endoscopy;  Laterality: N/A;  . ERCP W/ SPHICTEROTOMY  08/12/11   Dr Rehman-?microlithiasis, SOD  . ESOPHAGOGASTRODUODENOSCOPY  11/25/2011   Procedure: ESOPHAGOGASTRODUODENOSCOPY (EGD);  Surgeon: Rogene Houston, MD;  Location: [facility];  Service: Endoscopy;  Laterality: N/A;  830  . EUS  01/19/2012   Procedure: UPPER ENDOSCOPIC ULTRASOUND (EUS) LINEAR;  Surgeon: Milus Banister, MD;  Location: [facility];  Service: Endoscopy;  Laterality: N/A;  pt moved up a hour early by AW , Patty @ [place] office ok'd / pt was called by AW  . SPHINCTEROTOMY  08/12/2011   Procedure: SPHINCTEROTOMY;  Surgeon: Rogene Houston, MD;  Location: [facility];  Service: Endoscopy;;  with ballon passage  . TOTAL KNEE ARTHROPLASTY Right 09/13/2017  . TOTAL KNEE ARTHROPLASTY Right 09/13/2017   Procedure: RIGHT TOTAL KNEE ARTHROPLASTY;  Surgeon: Mcarthur Rossetti, MD;  Location: [place];  Service: Orthopedics;  Laterality: Right;  . TUBAL LIGATION      Family History  Problem Relation Age of Onset  . Diabetes Mother   . Heart failure Father   . Diabetes Brother   . Heart disease Sister   . Heart disease Maternal Grandmother   . Cancer Maternal Grandfather        ? pancreatic or lung  .  Anesthesia problems Neg Hx   . Hypotension Neg Hx   . Malignant hyperthermia Neg Hx   . Pseudochol deficiency Neg Hx   . Stroke Neg Hx     Allergies  Allergen Reactions  . Promethazine Hcl Hives  . Hydromorphone Itching  . Macrolides And Ketolides Itching  . Nitrofurantoin Monohyd Macro Itching    Current Outpatient Medications on File Prior to Visit  Medication Sig Dispense Refill  . acetaminophen (TYLENOL) 500 MG tablet Take 500 mg by mouth every 8 (eight) hours as needed for mild pain or moderate pain.    . hyoscyamine (LEVSIN SL) 0.125 MG SL tablet TAKE ONE TABLET DAILY EVERY 4 HOURS AS NEEDED FOR CRAMPING. 60 tablet 11  . ibuprofen (ADVIL,MOTRIN) 200 MG tablet Take 400 mg by mouth daily as needed for mild pain or moderate pain. Pain    . pantoprazole (  PROTONIX) 20 MG tablet TAKE ONE TABLET BY MOUTH 2 TIMES A DAY. 60 tablet 11  . pravastatin (PRAVACHOL) 10 MG tablet TAKE 1 TABLET BY MOUTH ONCE DAILY. 30 tablet 11   No current facility-administered medications on file prior to visit.    Ht 5' 4.25" (1.632 m)   Wt 184 lb (83.5 kg)   LMP 05/11/2011   BMI 31.34 kg/m    Objective:   Physical Exam Constitutional:      Appearance: She is not ill-appearing.  Pulmonary:     Effort: Pulmonary effort is normal.     Comments: No cough during visit Neurological:     Mental Status: She is alert and oriented to person, place, and time.  Psychiatric:        Mood and Affect: Mood normal.            Assessment & Plan:

## 2020-09-29 NOTE — Telephone Encounter (Signed)
Per appt notes pt already has video visit appt with Gentry Fitz NP 09/29/20 at 3PM.

## 2020-09-29 NOTE — Patient Instructions (Signed)
Complete your Covid-19 test this afternoon.  Continue Claritin daily for allergies. Nasal Congestion/Ear Pressure/Sinus Pressure: Try using Flonase (fluticasone) nasal spray. Instill 1 spray in each nostril twice daily.   We will be in touch within a few days.  It was a pleasure meeting you! Allie Bossier, NP-C

## 2020-10-01 LAB — SARS-COV-2, NAA 2 DAY TAT

## 2020-10-01 LAB — NOVEL CORONAVIRUS, NAA: SARS-CoV-2, NAA: NOT DETECTED

## 2020-11-24 ENCOUNTER — Encounter: Payer: Self-pay | Admitting: Internal Medicine

## 2020-11-24 ENCOUNTER — Telehealth (INDEPENDENT_AMBULATORY_CARE_PROVIDER_SITE_OTHER): Payer: BC Managed Care – PPO | Admitting: Internal Medicine

## 2020-11-24 VITALS — HR 66 | Temp 98.6°F

## 2020-11-24 DIAGNOSIS — R5383 Other fatigue: Secondary | ICD-10-CM

## 2020-11-24 DIAGNOSIS — R059 Cough, unspecified: Secondary | ICD-10-CM

## 2020-11-24 DIAGNOSIS — U071 COVID-19: Secondary | ICD-10-CM

## 2020-11-24 MED ORDER — BENZONATATE 200 MG PO CAPS: 200.0000 mg | ORAL_CAPSULE | Freq: Two times a day (BID) | ORAL | 0 refills | Status: DC | PRN

## 2020-11-24 MED ORDER — GUAIFENESIN-CODEINE 100-10 MG/5ML PO SYRP: 5.0000 mL | ORAL_SOLUTION | Freq: Every evening | ORAL | 0 refills | Status: DC | PRN

## 2020-11-24 MED ORDER — PREDNISONE 10 MG PO TABS: ORAL_TABLET | ORAL | 0 refills | Status: DC

## 2020-11-24 NOTE — Progress Notes (Signed)
Virtual Visit via Video Note  I connected with Mallory [name] on 11/24/20 at 10:15 AM EST by a video enabled telemedicine application and verified that I am speaking with the correct person using two identifiers.  Location: Patient: Home Provider: Office  Person's participating in this video call: Webb Silversmith, NP-C and Taria Castrillo.  I discussed the limitations of evaluation and management by telemedicine and the availability of in person appointments. The patient expressed understanding and agreed to proceed.  History of Present Illness:  Pt reports fatigue, cough and chest congestion. This started 1 week ago. The cough is mostly nonproductive. She denies current headache, runny nose, nasal congestion, ear pain, sore throat, loss of taste/smell or SOB. She denies fever, chills, body aches, nausea, vomiting or diarrhea.  She tested positive for Covid on 11/16/20. She has had Moderna vaccine x 2.  Past Medical History:  Diagnosis Date  . GERD (gastroesophageal reflux disease)   . HLD (hyperlipidemia)   . IBS (irritable bowel syndrome)   . Osteoarthritis of knee    Right  . Pulmonary embolism ([place])    5 yrs ago (unknown region)    Current Outpatient Medications  Medication Sig Dispense Refill  . acetaminophen (TYLENOL) 500 MG tablet Take 500 mg by mouth every 8 (eight) hours as needed for mild pain or moderate pain.    . hyoscyamine (LEVSIN SL) 0.125 MG SL tablet TAKE ONE TABLET DAILY EVERY 4 HOURS AS NEEDED FOR CRAMPING. 60 tablet 11  . ibuprofen (ADVIL,MOTRIN) 200 MG tablet Take 400 mg by mouth daily as needed for mild pain or moderate pain. Pain    . pantoprazole (PROTONIX) 20 MG tablet TAKE ONE TABLET BY MOUTH 2 TIMES A DAY. 60 tablet 11  . pravastatin (PRAVACHOL) 10 MG tablet TAKE 1 TABLET BY MOUTH ONCE DAILY. 30 tablet 11   No current facility-administered medications for this visit.    Allergies  Allergen Reactions  . Promethazine Hcl Hives  . Hydromorphone Itching  .  Macrolides And Ketolides Itching  . Nitrofurantoin Monohyd Macro Itching    Family History  Problem Relation Age of Onset  . Diabetes Mother   . Heart failure Father   . Diabetes Brother   . Heart disease Sister   . Heart disease Maternal Grandmother   . Cancer Maternal Grandfather        ? pancreatic or lung  . Anesthesia problems Neg Hx   . Hypotension Neg Hx   . Malignant hyperthermia Neg Hx   . Pseudochol deficiency Neg Hx   . Stroke Neg Hx     Social History   Socioeconomic History  . Marital status: Married    Spouse name: Not on file  . Number of children: 2  . Years of education: Not on file  . Highest education level: Not on file  Occupational History  . Occupation: Museum/gallery exhibitions officer: [employer]  Tobacco Use  . Smoking status: Never Smoker  . Smokeless tobacco: Never Used  Vaping Use  . Vaping Use: Never used  Substance and Sexual Activity  . Alcohol use: Yes    Alcohol/week: 0.0 standard drinks    Comment: occasional wine  . Drug use: No  . Sexual activity: Yes    Birth control/protection: Post-menopausal  Other Topics Concern  . Not on file  Social History Narrative   Lives w/ 35 yr old daughter.  2 daughters         Social Determinants of Health  Financial Resource Strain: Not on file  Food Insecurity: Not on file  Transportation Needs: Not on file  Physical Activity: Not on file  Stress: Not on file  Social Connections: Not on file  Intimate Partner Violence: Not on file     Constitutional: Pt reports fatigue. Denies fever, malaise, headache or abrupt weight changes.  HEENT: Denies eye pain, eye redness, ear pain, ringing in the ears, wax buildup, runny nose, nasal congestion, bloody nose, or sore throat. Respiratory: Pt reports cough. Denies difficulty breathing, shortness of breath, or sputum production.   Cardiovascular: Denies chest pain, chest tightness, palpitations or swelling in the hands or feet.  Gastrointestinal:  Denies abdominal pain, bloating, constipation, diarrhea or blood in the stool.    No other specific complaints in a complete review of systems (except as listed in HPI above).  Observations/Objective: LMP 05/11/2011  Wt Readings from Last 3 Encounters:  09/29/20 184 lb (83.5 kg)  12/20/19 184 lb (83.5 kg)  08/14/19 177 lb 12.8 oz (80.6 kg)    General: Appears her stated age, ill appearing but in NAD. HEENT: Head: normal shape and size;  Nose: no congestion noted; Throat/Mouth: hoarseness noted. Pulmonary/Chest: Normal effort and positive vesicular breath sounds. No respiratory distress. No wheezes, rales or ronchi noted.  Neurological: Alert and oriented.   BMET    Component Value Date/Time   NA 141 12/20/2019 1433   K 4.3 12/20/2019 1433   CL 103 12/20/2019 1433   CO2 26 12/20/2019 1433   GLUCOSE 105 (H) 12/20/2019 1433   BUN 12 12/20/2019 1433   CREATININE 0.81 12/20/2019 1433   CALCIUM 9.5 12/20/2019 1433   GFRNONAA >60 09/14/2017 0508   GFRAA >60 09/14/2017 0508    Lipid Panel     Component Value Date/Time   CHOL 174 12/20/2019 1433   TRIG 154 (H) 12/20/2019 1433   HDL 42 (L) 12/20/2019 1433   CHOLHDL 4.1 12/20/2019 1433   VLDL 23.8 01/25/2019 1237   LDLCALC 105 (H) 12/20/2019 1433    CBC    Component Value Date/Time   WBC 8.7 12/20/2019 1433   RBC 5.92 (H) 12/20/2019 1433   HGB 16.5 (H) 12/20/2019 1433   HCT 49.8 (H) 12/20/2019 1433   PLT 243 12/20/2019 1433   MCV 84.1 12/20/2019 1433   MCH 27.9 12/20/2019 1433   MCHC 33.1 12/20/2019 1433   RDW 13.3 12/20/2019 1433   LYMPHSABS 2.6 03/25/2016 1036   MONOABS 0.7 03/25/2016 1036   EOSABS 0.2 03/25/2016 1036   BASOSABS 0.0 03/25/2016 1036    Hgb A1C Lab Results  Component Value Date   HGBA1C 6.3 12/23/2014        Assessment and Plan:  Fatigue, Cough secondary to Covid 19:  No indication for abx at this time RX for Pred Taper x 6 days RX for Tessalon 200 mg TID prn RX for Robitussion AC  QHS prn She will continue to monitor oxygen, HR and temp  Return/ER precautions discussed Follow Up Instructions:    I discussed the assessment and treatment plan with the patient. The patient was provided an opportunity to ask questions and all were answered. The patient agreed with the plan and demonstrated an understanding of the instructions.   The patient was advised to call back or seek an in-person evaluation if the symptoms worsen or if the condition fails to improve as anticipated.     Webb Silversmith, NP

## 2020-11-24 NOTE — Patient Instructions (Signed)

## 2020-11-27 ENCOUNTER — Telehealth: Payer: Self-pay | Admitting: Internal Medicine

## 2020-11-27 ENCOUNTER — Emergency Department (HOSPITAL_COMMUNITY): Payer: BC Managed Care – PPO

## 2020-11-27 ENCOUNTER — Other Ambulatory Visit: Payer: Self-pay

## 2020-11-27 ENCOUNTER — Emergency Department (HOSPITAL_COMMUNITY)
Admission: EM | Admit: 2020-11-27 | Discharge: 2020-11-27 | Disposition: A | Discharge status: Home / Self Care | Payer: BC Managed Care – PPO | Attending: Emergency Medicine | Admitting: Emergency Medicine

## 2020-11-27 ENCOUNTER — Encounter (HOSPITAL_COMMUNITY): Payer: Self-pay | Admitting: Emergency Medicine

## 2020-11-27 DIAGNOSIS — Z96651 Presence of right artificial knee joint: Secondary | ICD-10-CM | POA: Diagnosis not present

## 2020-11-27 DIAGNOSIS — R059 Cough, unspecified: Secondary | ICD-10-CM | POA: Diagnosis not present

## 2020-11-27 DIAGNOSIS — Z7982 Long term (current) use of aspirin: Secondary | ICD-10-CM | POA: Diagnosis not present

## 2020-11-27 DIAGNOSIS — U071 COVID-19: Secondary | ICD-10-CM | POA: Insufficient documentation

## 2020-11-27 DIAGNOSIS — J189 Pneumonia, unspecified organism: Secondary | ICD-10-CM

## 2020-11-27 DIAGNOSIS — R0602 Shortness of breath: Secondary | ICD-10-CM | POA: Diagnosis not present

## 2020-11-27 DIAGNOSIS — M546 Pain in thoracic spine: Secondary | ICD-10-CM | POA: Insufficient documentation

## 2020-11-27 DIAGNOSIS — J1282 Pneumonia due to coronavirus disease 2019: Secondary | ICD-10-CM | POA: Diagnosis not present

## 2020-11-27 DIAGNOSIS — I2699 Other pulmonary embolism without acute cor pulmonale: Secondary | ICD-10-CM | POA: Diagnosis not present

## 2020-11-27 DIAGNOSIS — R079 Chest pain, unspecified: Secondary | ICD-10-CM | POA: Diagnosis not present

## 2020-11-27 LAB — CBC
HCT: 51.3 % — ABNORMAL HIGH (ref 36.0–46.0)
Hemoglobin: 16.7 g/dL — ABNORMAL HIGH (ref 12.0–15.0)
MCH: 27.9 pg (ref 26.0–34.0)
MCHC: 32.6 g/dL (ref 30.0–36.0)
MCV: 85.8 fL (ref 80.0–100.0)
Platelets: 256 10*3/uL (ref 150–400)
RBC: 5.98 MIL/uL — ABNORMAL HIGH (ref 3.87–5.11)
RDW: 13.7 % (ref 11.5–15.5)
WBC: 14.9 10*3/uL — ABNORMAL HIGH (ref 4.0–10.5)
nRBC: 0 % (ref 0.0–0.2)

## 2020-11-27 LAB — URINALYSIS, ROUTINE W REFLEX MICROSCOPIC
Bilirubin Urine: NEGATIVE
Glucose, UA: NEGATIVE mg/dL
Hgb urine dipstick: NEGATIVE
Ketones, ur: NEGATIVE mg/dL
Leukocytes,Ua: NEGATIVE
Nitrite: NEGATIVE
Protein, ur: NEGATIVE mg/dL
Specific Gravity, Urine: 1.006 (ref 1.005–1.030)
pH: 7 (ref 5.0–8.0)

## 2020-11-27 LAB — BASIC METABOLIC PANEL
Anion gap: 6 (ref 5–15)
BUN: 16 mg/dL (ref 8–23)
CO2: 26 mmol/L (ref 22–32)
Calcium: 9 mg/dL (ref 8.9–10.3)
Chloride: 106 mmol/L (ref 98–111)
Creatinine, Ser: 0.8 mg/dL (ref 0.44–1.00)
GFR, Estimated: >60 mL/min (ref >60)
Glucose, Bld: 135 mg/dL — ABNORMAL HIGH (ref 70–99)
Potassium: 3.8 mmol/L (ref 3.5–5.1)
Sodium: 138 mmol/L (ref 135–145)

## 2020-11-27 LAB — TROPONIN I (HIGH SENSITIVITY)
Troponin I (High Sensitivity): 4 ng/L (ref <18)
Troponin I (High Sensitivity): 5 ng/L (ref <18)

## 2020-11-27 LAB — D-DIMER, QUANTITATIVE: D-Dimer, Quant: 0.78 ug/mL-FEU — ABNORMAL HIGH (ref 0.00–0.50)

## 2020-11-27 MED ORDER — ALBUTEROL SULFATE HFA 108 (90 BASE) MCG/ACT IN AERS
2.0000 | INHALATION_SPRAY | RESPIRATORY_TRACT | Status: DC | PRN
  Administered 2020-11-27: 2 via RESPIRATORY_TRACT
  Filled 2020-11-27: qty 6.7

## 2020-11-27 MED ORDER — IOHEXOL 350 MG/ML SOLN
75.0000 mL | Freq: Once | INTRAVENOUS | Status: AC | PRN
  Administered 2020-11-27: 75 mL via INTRAVENOUS

## 2020-11-27 MED ORDER — SODIUM CHLORIDE 0.9 % IV SOLN
1.0000 g | Freq: Once | INTRAVENOUS | Status: AC
  Administered 2020-11-27: 1 g via INTRAVENOUS
  Filled 2020-11-27: qty 10

## 2020-11-27 MED ORDER — AMOXICILLIN 500 MG PO CAPS: 1000.0000 mg | ORAL_CAPSULE | Freq: Three times a day (TID) | ORAL | 0 refills | Status: AC

## 2020-11-27 NOTE — Telephone Encounter (Signed)
Pt called in wanted to let Rollene Fare know that she is having a nagging pain in her rib cage and her breathe is little shorter , it started yesterday and she was uncomfortable sleeping

## 2020-11-27 NOTE — Telephone Encounter (Signed)
Melanie CMA said she has already sent note to Mallory Echevaria NP and [facility] CMA will FU.sending access nurse note to Mallory Echevaria NP.

## 2020-11-27 NOTE — Discharge Instructions (Addendum)
You are being treated for possible bacterial pneumonia as discussed, which may be present in the presence of Covid infection. Rest and get rechecked for any worsening symptoms.  Take the next dose of the antibiotic prescribed tomorrow.  Finish your prednisone also.  You may also benefit by using an albuterol inhaler as given - use this medicine - 2 puffs up to every 4 hours if needed for cough or shortness of breath.

## 2020-11-27 NOTE — ED Notes (Signed)
Pt c/o pain between shoulder blades since diagnosed with Covid on 11/16/20. Pt reports the pain is constant and nagging. She reports some SOB, but "not bad". Pt does report pain with deep breathing. Pt also reports slight cough at times. O2 sat 96% on RA.

## 2020-11-27 NOTE — ED Notes (Signed)
Pt ambulated in the room with no issues. Pt denied feeling SOB. sats remained above 97% on RA while ambulating. PA made aware.

## 2020-11-27 NOTE — Telephone Encounter (Signed)
She called back she's going to the hospital

## 2020-11-27 NOTE — ED Provider Notes (Signed)
[facility] EMERGENCY DEPARTMENT Provider Note   CSN: [id number] Arrival date & time: 11/27/20  1216     History Chief Complaint  Patient presents with  . Chest Pain    Mallory [name] is a 65 y.o. female with a history of IBS, GERD and unprovoked pulmonary embolism occurring approximately 8 years ago with no known etiology presenting for evaluation of shortness of breath, cough and constant bilateral thoracic back pain since yesterday.  She was diagnosed with COVID-19 11 days ago, predominant symptoms including nonproductive cough and shortness of breath.  Her symptoms were treated with cough medications and prednisone.  She describes constant aching pain in her thoracic back between her shoulder blades which developed gradually yesterday.  She denies lower extremity pain or swelling.  She has been more sedentary than normal given her Covid diagnosis as she has been under home quarantine.  She denies fevers or chills, nausea, vomiting, diarrhea, denies chest pain.  Symptoms are worsened with exertion.  She denies palpitations, in fact notes that her pulse is lower than normal for her today.  The history is provided by the patient.       Past Medical History:  Diagnosis Date  . GERD (gastroesophageal reflux disease)   . HLD (hyperlipidemia)   . IBS (irritable bowel syndrome)   . Osteoarthritis of knee    Right  . Pulmonary embolism ([place])    5 yrs ago (unknown region)    Patient Active Problem List   Diagnosis Date Noted  . Allergic rhinitis 09/29/2020  . OA (osteoarthritis) 01/31/2018  . IBS (irritable bowel syndrome) 12/06/2016  . HLD (hyperlipidemia) 11/16/2015  . GERD (gastroesophageal reflux disease) 08/14/2011  . History of pulmonary embolus (PE) 08/14/2011    Past Surgical History:  Procedure Laterality Date  . [place]   mc  . CHOLECYSTECTOMY  15 yrs ago   mc  . COLONOSCOPY  05/24/2012   Procedure: COLONOSCOPY;  Surgeon: Rogene Houston, MD;   Location: [facility];  Service: Endoscopy;  Laterality: N/A;  220  . ERCP  08/12/2011   Procedure: ENDOSCOPIC RETROGRADE CHOLANGIOPANCREATOGRAPHY (ERCP);  Surgeon: Rogene Houston, MD;  Location: [facility] ORS;  Service: Endoscopy;  Laterality: N/A;  . ERCP W/ SPHICTEROTOMY  08/12/11   Dr Rehman-?microlithiasis, SOD  . ESOPHAGOGASTRODUODENOSCOPY  11/25/2011   Procedure: ESOPHAGOGASTRODUODENOSCOPY (EGD);  Surgeon: Rogene Houston, MD;  Location: [facility];  Service: Endoscopy;  Laterality: N/A;  830  . EUS  01/19/2012   Procedure: UPPER ENDOSCOPIC ULTRASOUND (EUS) LINEAR;  Surgeon: Milus Banister, MD;  Location: [facility] END[facility];  Service: Endoscopy;  Laterality: N/A;  pt moved up a hour early by AW , Patty @ [place] office ok'd / pt was called by AW  . SPHINCTEROTOMY  08/12/2011   Procedure: SPHINCTEROTOMY;  Surgeon: Rogene Houston, MD;  Location: [facility] ORS;  Service: Endoscopy;;  with ballon passage  . TOTAL KNEE ARTHROPLASTY Right 09/13/2017  . TOTAL KNEE ARTHROPLASTY Right 09/13/2017   Procedure: RIGHT TOTAL KNEE ARTHROPLASTY;  Surgeon: Mcarthur Rossetti, MD;  Location: [facility];  Service: Orthopedics;  Laterality: Right;  . TUBAL LIGATION       OB History   No obstetric history on file.     Family History  Problem Relation Age of Onset  . Diabetes Mother   . Heart failure Father   . Diabetes Brother   . Heart disease Sister   . Heart disease Maternal Grandmother   . Cancer Maternal  Grandfather        ? pancreatic or lung  . Anesthesia problems Neg Hx   . Hypotension Neg Hx   . Malignant hyperthermia Neg Hx   . Pseudochol deficiency Neg Hx   . Stroke Neg Hx     Social History   Tobacco Use  . Smoking status: Never Smoker  . Smokeless tobacco: Never Used  Vaping Use  . Vaping Use: Never used  Substance Use Topics  . Alcohol use: Yes    Alcohol/week: 0.0 standard drinks    Comment: occasional wine  . Drug use: No    Home Medications Prior to Admission medications    Medication Sig Start Date End Date Taking? Authorizing Provider  aspirin EC 81 MG tablet Take 81 mg by mouth every other day. Swallow whole.   Yes [provider]  benzonatate (TESSALON) 200 MG capsule Take 1 capsule (200 mg total) by mouth 2 (two) times daily as needed for cough. 11/24/20  Yes Jearld Fenton, NP  cholecalciferol (VITAMIN D3) 25 MCG (1000 UNIT) tablet Take 1,000 Units by mouth daily.   Yes [provider]  guaiFENesin-codeine (ROBITUSSIN AC) 100-10 MG/5ML syrup Take 5 mLs by mouth at bedtime as needed for cough. 11/24/20  Yes Jearld Fenton, NP  pantoprazole (PROTONIX) 20 MG tablet TAKE ONE TABLET BY MOUTH 2 TIMES A DAY. 11/08/19  Yes Milus Banister, MD  pravastatin (PRAVACHOL) 10 MG tablet TAKE 1 TABLET BY MOUTH ONCE DAILY. 12/25/19  Yes Baity, Coralie Keens, NP  predniSONE (DELTASONE) 10 MG tablet Take 6 tabs day 1, 5 tabs day 2, 4 tabs day 3, 3 tabs day 4, 2 tabs day 5, 1 tab day 6 11/24/20  Yes Baity, Coralie Keens, NP  acetaminophen (TYLENOL) 500 MG tablet Take 500 mg by mouth every 8 (eight) hours as needed for mild pain or moderate pain. Patient not taking: Reported on 11/27/2020    [provider]  hyoscyamine (LEVSIN SL) 0.125 MG SL tablet TAKE ONE TABLET DAILY EVERY 4 HOURS AS NEEDED FOR CRAMPING. Patient not taking: Reported on 11/27/2020 11/08/19   Milus Banister, MD  ibuprofen (ADVIL,MOTRIN) 200 MG tablet Take 400 mg by mouth daily as needed for mild pain or moderate pain. Pain    [provider]    Allergies    Promethazine hcl, Hydromorphone, Macrolides and ketolides, and Nitrofurantoin monohyd macro  Review of Systems   Review of Systems  Constitutional: Negative for chills and fever.  HENT: Negative for congestion.   Eyes: Negative.   Respiratory: Positive for cough and shortness of breath. Negative for chest tightness.   Cardiovascular: Negative for chest pain, palpitations and leg swelling.  Gastrointestinal: Negative for  abdominal pain, diarrhea, nausea and vomiting.  Genitourinary: Negative.   Musculoskeletal: Positive for back pain. Negative for arthralgias, joint swelling and neck pain.  Skin: Negative.  Negative for rash and wound.  Neurological: Negative for dizziness, weakness, light-headedness, numbness and headaches.  Psychiatric/Behavioral: Negative.     Physical Exam Updated Vital Signs BP (!) 152/82   Pulse (!) 50   Temp 97.8 F (36.6 C) (Oral)   Resp (!) 24   Ht 5\' 3"  (1.6 m)   Wt 81.6 kg   LMP 05/11/2011   SpO2 96%   BMI 31.89 kg/m   Physical Exam Vitals and nursing note reviewed.  Constitutional:      Appearance: She is well-developed and well-nourished.  HENT:     Head: Normocephalic and atraumatic.  Eyes:     Conjunctiva/sclera: Conjunctivae normal.  Cardiovascular:     Rate and Rhythm: Normal rate and regular rhythm.     Pulses: Intact distal pulses.     Heart sounds: Normal heart sounds.  Pulmonary:     Effort: Pulmonary effort is normal.     Breath sounds: Normal breath sounds. No wheezing or rhonchi.  Abdominal:     General: Bowel sounds are normal.     Palpations: Abdomen is soft.     Tenderness: There is no abdominal tenderness.  Musculoskeletal:        General: Normal range of motion.     Cervical back: Normal range of motion.       Back:     Right lower leg: No tenderness. No edema.     Left lower leg: No tenderness. No edema.     Comments: Pain localizes across to her bilateral lower thoracic region.  Is not reproducible on exam.  No erythema, no rash.  No localizing midline thoracic pain.  Skin:    General: Skin is warm and dry.  Neurological:     General: No focal deficit present.     Mental Status: She is alert.  Psychiatric:        Mood and Affect: Mood and affect normal.     ED Results / Procedures / Treatments   Labs (all labs ordered are listed, but only abnormal results are displayed) Labs Reviewed  CBC - Abnormal; Notable for the  following components:      Result Value   WBC 14.9 (*)    RBC 5.98 (*)    Hemoglobin 16.7 (*)    HCT 51.3 (*)    All other components within normal limits  BASIC METABOLIC PANEL - Abnormal; Notable for the following components:   Glucose, Bld 135 (*)    All other components within normal limits  D-DIMER, QUANTITATIVE - Abnormal; Notable for the following components:   D-Dimer, Quant 0.78 (*)    All other components within normal limits  URINALYSIS, ROUTINE W REFLEX MICROSCOPIC - Abnormal; Notable for the following components:   Color, Urine STRAW (*)    All other components within normal limits  TROPONIN I (HIGH SENSITIVITY)  TROPONIN I (HIGH SENSITIVITY)    EKG None  Radiology CT Angio Chest PE W and/or Wo Contrast  Result Date: 11/27/2020 CLINICAL DATA:  65 year old female with concern for pulmonary embolism. EXAM: CT ANGIOGRAPHY CHEST WITH CONTRAST TECHNIQUE: Multidetector CT imaging of the chest was performed using the standard protocol during bolus administration of intravenous contrast. Multiplanar CT image reconstructions and MIPs were obtained to evaluate the vascular anatomy. CONTRAST:  61mL OMNIPAQUE IOHEXOL 350 MG/ML SOLN COMPARISON:  Chest radiograph dated 11/27/2020 and CT dated 03/30/2015. FINDINGS: Cardiovascular: There is no cardiomegaly or pericardial effusion. The thoracic aorta is unremarkable for the degree of opacification. Evaluation of the pulmonary arteries is somewhat limited due to respiratory motion artifact. No pulmonary artery embolus identified. Mediastinum/Nodes: No hilar or mediastinal adenopathy. The esophagus is grossly unremarkable. No mediastinal fluid collection. Lungs/Pleura: Bilateral streaky and ground-glass densities primarily involving the lower lobes most consistent with multifocal pneumonia, likely viral or atypical in etiology including COVID-19. Clinical correlation is recommended. There is no pleural effusion pneumothorax. The central airways  are patent. Upper Abdomen: Cholecystectomy.  Pneumobilia. Musculoskeletal: No chest wall abnormality. No acute or significant osseous findings. Review of the MIP images confirms the above findings. IMPRESSION: 1. No CT evidence of pulmonary embolism. 2. Multilobar pneumonia,  likely viral or atypical in etiology including COVID-19. Clinical correlation is recommended. Electronically Signed   By: Anner Crete M.D.   On: 11/27/2020 15:36   DG Chest Portable 1 View  Result Date: 11/27/2020 CLINICAL DATA:  COVID positive.  Cough and chest pain. EXAM: PORTABLE CHEST 1 VIEW COMPARISON:  03/30/2015 FINDINGS: 1255 hours. Left base airspace disease noted. Right lung clear. Cardiopericardial silhouette is at upper limits of normal for size. The visualized bony structures of the thorax show no acute abnormality. Telemetry leads overlie the chest. IMPRESSION: Patchy left basilar airspace disease compatible with pneumonia. Electronically Signed   By: Misty Stanley M.D.   On: 11/27/2020 13:04    Procedures Procedures   Medications Ordered in ED Medications  cefTRIAXone (ROCEPHIN) 1 g in sodium chloride 0.9 % 100 mL IVPB (has no administration in time range)  iohexol (OMNIPAQUE) 350 MG/ML injection 75 mL (75 mLs Intravenous Contrast Given 11/27/20 1514)    ED Course  I have reviewed the triage vital signs and the nursing notes.  Pertinent labs & imaging results that were available during my care of the patient were reviewed by me and considered in my medical decision making (see chart for details).    MDM Rules/Calculators/A&P                          Pt with known Covid 31, diagnosed on 11/16/20, now with multilobar pneumonia, probably viral vs atypical. Hx of PE, negative CT angio today for PE.  She does have a significant elevation in her WBC count of 14.9, making me concerned that she may have a overlying bacterial pneumonia in addition to her Covid infection.  She will be covered for this possibility,  she was given a dose of IV Rocephin here, will finish amoxil at home.  Albuterol mdi given. She is currently on a prednisone taper currently on day 3 of a 6-day taper, encouraged to finish.  Patient was ambulated in the department with a pulse ox and she had no significant hypoxia, oxygen remaining above 97%..  She was felt safe for discharge home.  Strict return precautions were discussed.  Mallory [name] was evaluated in Emergency Department on 11/27/2020 for the symptoms described in the history of present illness. She was evaluated in the context of the global COVID-19 pandemic, which necessitated consideration that the patient might be at risk for infection with the SARS-CoV-2 virus that causes COVID-19. Institutional protocols and algorithms that pertain to the evaluation of patients at risk for COVID-19 are in a state of rapid change based on information released by regulatory bodies including the CDC and federal and state organizations. These policies and algorithms were followed during the patient's care in the ED.  Final Clinical Impression(s) / ED Diagnoses Final diagnoses:  COVID-19  Community acquired pneumonia, unspecified laterality    Rx / DC Orders ED Discharge Orders    None       Landis Martins 11/27/20 1704    Horton, Alvin Critchley, DO 11/28/20 0930

## 2020-11-27 NOTE — Telephone Encounter (Signed)
Will review ER note 

## 2020-11-27 NOTE — ED Triage Notes (Signed)
Pt was covid positive on 11/16/20. Was given cough meds and prednisone but now c/o of chest pain between should blades

## 2020-11-27 NOTE — Telephone Encounter (Signed)
[facility] Night - Client TELEPHONE ADVICE RECORD AccessNurse Patient Name: Mallory [name] Gender: Female DOB: 05-16-1956 Age: 65 Y 9 M 26 D Return Phone Number: [phone] (Primary) Address: City/State/Zip: [place] [address] Client [facility] Night - Client Client Site [place] Physician Webb Silversmith - NP Contact Type Call Who Is Calling Patient / Member / Family / Caregiver Call Type Triage / Clinical Relationship To Patient Self Return Phone Number [phone] (Primary) Chief Complaint Pain - Generalized Reason for Call Symptomatic / Request for [place] states she has COVID and two medication was called in. Caller states she is now experiencing rib cage pain and she would like to let her doctor know. Translation No Nurse Assessment Nurse: Ysidro Evert, RN, Lev[place] Dy Date/Time (Eastern Time): 11/27/2020 7:48:57 AM Confirm and document reason for call. If symptomatic, describe symptoms. ---Caller states she tested positive for covid almost 2 weeks ago and she is having pain in her ribcage that she feels all the time. She still has a cough Does the patient have any new or worsening symptoms? ---Yes Will a triage be completed? ---Yes Related visit to physician within the last 2 weeks? ---No Does the PT have any chronic conditions? (i.e. diabetes, asthma, this includes High risk factors for pregnancy, etc.) ---No Is this a behavioral health or substance abuse call? ---No Guidelines Guideline Title Affirmed Question Affirmed Notes Nurse Date/Time ([place] Time) COVID-19 - Diagnosed or Suspected Chest pain or pressure Ysidro Evert, RN, Levada Dy 11/27/2020 7:50:35 AM Disp. Time [place] Ghazi Time) Disposition Final User 11/27/2020 7:55:59 AM Go to ED Now (or PCP triage) Yes Ysidro Evert, RN, [place] Shutter Disagree/Comply Comply Caller Understands Yes PreDisposition Did not know what to  do PLEASE NOTE: All timestamps contained within this report are represented as [place] Standard Time. CONFIDENTIALTY NOTICE: This fax transmission is intended only for the addressee. It contains information that is legally privileged, confidential or otherwise protected from use or disclosure. If you are not the intended recipient, you are strictly prohibited from reviewing, disclosing, copying using or disseminating any of this information or taking any action in reliance on or regarding this information. If you have received this fax in error, please notify us immediately by telephone so that we can arrange for its return to [place]. Phone: [phone], Toll-Free: [phone], Fax: [phone] Page: 2 of 2 Call Id: [id number] Care Advice Given Per Guideline GO TO ED NOW (OR PCP TRIAGE): * IF NO PCP (PRIMARY CARE PROVIDER) SECOND-LEVEL TRIAGE: You need to be seen within the next hour. Go to the [facility] at _____________ [place] as soon as you can. CARE ADVICE given per COVID-19 - DIAGNOSED OR SUSPECTED (Adult) guideline. Referrals REFERRED TO PCP OFFICE

## 2020-12-06 ENCOUNTER — Other Ambulatory Visit: Payer: Self-pay | Admitting: Gastroenterology

## 2020-12-06 DIAGNOSIS — K219 Gastro-esophageal reflux disease without esophagitis: Secondary | ICD-10-CM

## 2021-01-04 ENCOUNTER — Other Ambulatory Visit: Payer: Self-pay | Admitting: Internal Medicine

## 2021-01-19 ENCOUNTER — Ambulatory Visit (INDEPENDENT_AMBULATORY_CARE_PROVIDER_SITE_OTHER): Payer: BC Managed Care – PPO | Admitting: Gastroenterology

## 2021-01-19 ENCOUNTER — Other Ambulatory Visit: Payer: Self-pay

## 2021-01-19 ENCOUNTER — Encounter: Payer: Self-pay | Admitting: Nurse Practitioner

## 2021-01-19 ENCOUNTER — Encounter: Payer: Self-pay | Admitting: Gastroenterology

## 2021-01-19 ENCOUNTER — Ambulatory Visit (INDEPENDENT_AMBULATORY_CARE_PROVIDER_SITE_OTHER): Payer: BC Managed Care – PPO | Admitting: Nurse Practitioner

## 2021-01-19 VITALS — BP 130/80 | HR 76 | Ht 63.0 in | Wt 184.0 lb

## 2021-01-19 VITALS — BP 142/88 | HR 68 | Temp 98.2°F | Resp 20 | Ht 63.0 in | Wt 184.0 lb

## 2021-01-19 DIAGNOSIS — E7800 Pure hypercholesterolemia, unspecified: Secondary | ICD-10-CM

## 2021-01-19 DIAGNOSIS — Z86711 Personal history of pulmonary embolism: Secondary | ICD-10-CM

## 2021-01-19 DIAGNOSIS — Z0000 Encounter for general adult medical examination without abnormal findings: Secondary | ICD-10-CM

## 2021-01-19 DIAGNOSIS — K219 Gastro-esophageal reflux disease without esophagitis: Secondary | ICD-10-CM | POA: Diagnosis not present

## 2021-01-19 DIAGNOSIS — K589 Irritable bowel syndrome without diarrhea: Secondary | ICD-10-CM

## 2021-01-19 DIAGNOSIS — M1711 Unilateral primary osteoarthritis, right knee: Secondary | ICD-10-CM

## 2021-01-19 DIAGNOSIS — Z7689 Persons encountering health services in other specified circumstances: Secondary | ICD-10-CM

## 2021-01-19 MED ORDER — HYOSCYAMINE SULFATE 0.125 MG SL SUBL: SUBLINGUAL_TABLET | SUBLINGUAL | 3 refills | Status: DC

## 2021-01-19 MED ORDER — PANTOPRAZOLE SODIUM 40 MG PO TBEC: 40.0000 mg | DELAYED_RELEASE_TABLET | Freq: Every day | ORAL | 11 refills | Status: DC

## 2021-01-19 NOTE — Progress Notes (Signed)
New Patient Office Visit  Subjective:  Patient ID: Mallory [name], female    DOB: March 20, 1956  Age: 65 y.o. MRN: [id number]  CC:  Chief Complaint  Patient presents with  . New Patient (Initial Visit)    HPI [name] presents for new patient visit. Transferring care from [place] at [facility]. Last physical was 12/20/19. Last labs were drawn 12/20/19. She did have a CBC on 11/27/20, not a full lab panel at that time.  No acute concerns.  She sees Dr. Ardis Hughs for IBS, but she states that she doesn't have constipation or diarrhea.  Past Medical History:  Diagnosis Date  . GERD (gastroesophageal reflux disease)   . HLD (hyperlipidemia)   . IBS (irritable bowel syndrome)   . Osteoarthritis of knee    Right; had right TKR  . Pulmonary embolism ([place])    5 yrs ago (unknown region)    Past Surgical History:  Procedure Laterality Date  . [place]   mc  . CESAREAN SECTION N/A    Phreesia 01/16/2021  . CHOLECYSTECTOMY  15 yrs ago   mc  . COLONOSCOPY  05/24/2012   Procedure: COLONOSCOPY;  Surgeon: Rogene Houston, MD;  [facility];  Service: Endoscopy;  Laterality: N/A;  220  . ERCP  08/12/2011   Procedure: ENDOSCOPIC RETROGRADE CHOLANGIOPANCREATOGRAPHY (ERCP);  Surgeon: Rogene Houston, MD;  Location: [facility];  Service: Endoscopy;  Laterality: N/A;  . ERCP W/ SPHICTEROTOMY  08/12/11   Dr Rehman-?microlithiasis, SOD  . ESOPHAGOGASTRODUODENOSCOPY  11/25/2011   Procedure: ESOPHAGOGASTRODUODENOSCOPY (EGD);  Surgeon: Rogene Houston, MD;  [facility];  Service: Endoscopy;  Laterality: N/A;  830  . EUS  01/19/2012   Procedure: UPPER ENDOSCOPIC ULTRASOUND (EUS) LINEAR;  Surgeon: Milus Banister, MD;  [facility];  Service: Endoscopy;  Laterality: N/A;  pt moved up a hour early by AW , Patty @ [place] office ok'd / pt was called by AW  . JOINT REPLACEMENT N/A    Phreesia 01/16/2021  . SPHINCTEROTOMY  08/12/2011   Procedure: SPHINCTEROTOMY;   Surgeon: Rogene Houston, MD;  Location: [facility];  Service: Endoscopy;;  with ballon passage  . TOTAL KNEE ARTHROPLASTY Right 09/13/2017  . TOTAL KNEE ARTHROPLASTY Right 09/13/2017   Procedure: RIGHT TOTAL KNEE ARTHROPLASTY;  Surgeon: Mcarthur Rossetti, MD;  Location: [place];  Service: Orthopedics;  Laterality: Right;  . TUBAL LIGATION      Family History  Problem Relation Age of Onset  . Diabetes Mother   . Heart failure Father   . Diabetes Brother   . Heart disease Sister   . Heart disease Maternal Grandmother   . Cancer Maternal Grandfather        ? pancreatic or lung  . Anesthesia problems Neg Hx   . Hypotension Neg Hx   . Malignant hyperthermia Neg Hx   . Pseudochol deficiency Neg Hx   . Stroke Neg Hx     Social History   Socioeconomic History  . Marital status: Married    Spouse name: Not on file  . Number of children: 2  . Years of education: Not on file  . Highest education level: Not on file  Occupational History  . Occupation: [employer]    Comment: Medical sales representative  Tobacco Use  . Smoking status: Never Smoker  . Smokeless tobacco: Never Used  Vaping Use  . Vaping Use: Never used  Substance and Sexual Activity  . Alcohol use:  Yes    Alcohol/week: 0.0 standard drinks    Comment: occasional wine or moonshine, but only on vacation  . Drug use: No  . Sexual activity: Yes    Birth control/protection: Post-menopausal  Other Topics Concern  . Not on file  Social History Narrative    2 daughters      Social Determinants of Health   Financial Resource Strain: Not on file  Food Insecurity: Not on file  Transportation Needs: Not on file  Physical Activity: Not on file  Stress: Not on file  Social Connections: Not on file  Intimate Partner Violence: Not on file    ROS Review of Systems  Constitutional: Negative.   Respiratory: Negative.   Cardiovascular: Negative.   Gastrointestinal:       Has occasional abdominal pain since surgical complications   Neurological: Negative.   Psychiatric/Behavioral: Negative.     Objective:   Today's Vitals: BP (!) 142/88   Pulse 68   Temp 98.2 F (36.8 C)   Resp 20   Ht 5' 3"  (1.6 m)   Wt 184 lb (83.5 kg)   LMP 05/11/2011   SpO2 95%   BMI 32.59 kg/m   Physical Exam Constitutional:      Appearance: Normal appearance.  Cardiovascular:     Rate and Rhythm: Normal rate and regular rhythm.     Pulses: Normal pulses.     Heart sounds: Normal heart sounds.  Pulmonary:     Effort: Pulmonary effort is normal.     Breath sounds: Normal breath sounds.  Neurological:     Mental Status: She is alert.  Psychiatric:        Mood and Affect: Mood normal.        Behavior: Behavior normal.        Thought Content: Thought content normal.        Judgment: Judgment normal.     Assessment & Plan:   Problem List Items Addressed This Visit      Digestive   IBS (irritable bowel syndrome)    -she states that she has had abdominal pain that comes and goes since bowel perforation incident -was taking hyosciamine, but is no longer taking this -is followed by Dr. Ardis Hughs with [facility]        Musculoskeletal and Integument   OA (osteoarthritis)    -Dr. Ninfa Linden did right TKR several years ago (around 2018 or 2019) -no issues today        Other   History of pulmonary embolus (PE)    -no recurrence -no anticoagulation -no concerns today      HLD (hyperlipidemia)    Lab Results  Component Value Date   CHOL 174 12/20/2019   HDL 42 (L) 12/20/2019   LDLCALC 105 (H) 12/20/2019   TRIG 154 (H) 12/20/2019   CHOLHDL 4.1 12/20/2019  -will check with next labs       Encounter to establish care    -transferring care from [place] at [facility] -briefly reviewed records      Relevant Orders   CBC with Differential/Platelet   CMP14+EGFR   Lipid Panel With LDL/HDL Ratio   TSH    Other Visit Diagnoses    Annual physical exam    -  Primary   Relevant Orders   CBC with  Differential/Platelet   CMP14+EGFR   Lipid Panel With LDL/HDL Ratio   TSH      Outpatient Encounter Medications as of 01/19/2021  Medication Sig  . aspirin EC  81 MG tablet Take 81 mg by mouth every other day. Swallow whole.  . cholecalciferol (VITAMIN D3) 25 MCG (1000 UNIT) tablet Take 1,000 Units by mouth daily.  Marland Kitchen ibuprofen (ADVIL,MOTRIN) 200 MG tablet Take 400 mg by mouth daily as needed for mild pain or moderate pain. Pain  . pantoprazole (PROTONIX) 20 MG tablet TAKE ONE TABLET BY MOUTH 2 TIMES A DAY.  . pravastatin (PRAVACHOL) 10 MG tablet TAKE 1 TABLET BY MOUTH ONCE DAILY.  . [DISCONTINUED] acetaminophen (TYLENOL) 500 MG tablet Take 500 mg by mouth every 8 (eight) hours as needed for mild pain or moderate pain. (Patient not taking: Reported on 11/27/2020)  . [DISCONTINUED] benzonatate (TESSALON) 200 MG capsule Take 1 capsule (200 mg total) by mouth 2 (two) times daily as needed for cough.  . [DISCONTINUED] guaiFENesin-codeine (ROBITUSSIN AC) 100-10 MG/5ML syrup Take 5 mLs by mouth at bedtime as needed for cough. (Patient not taking: Reported on 01/19/2021)  . [DISCONTINUED] hyoscyamine (LEVSIN SL) 0.125 MG SL tablet TAKE ONE TABLET DAILY EVERY 4 HOURS AS NEEDED FOR CRAMPING. (Patient not taking: Reported on 11/27/2020)  . [DISCONTINUED] predniSONE (DELTASONE) 10 MG tablet Take 6 tabs day 1, 5 tabs day 2, 4 tabs day 3, 3 tabs day 4, 2 tabs day 5, 1 tab day 6 (Patient not taking: Reported on 01/19/2021)   No facility-administered encounter medications on file as of 01/19/2021.    Follow-up: Return in about 2 weeks (around 02/02/2021) for Physical Exam.   Noreene Larsson, NP

## 2021-01-19 NOTE — Assessment & Plan Note (Signed)
-  transferring care from [place] at [facility] -briefly reviewed records

## 2021-01-19 NOTE — Assessment & Plan Note (Signed)
-  Dr. Ninfa Linden did right TKR several years ago (around 2018 or 2019) -no issues today

## 2021-01-19 NOTE — Progress Notes (Signed)
Previous GI testing: 08/2011 ERCPwith Dr. Laural Golden, for right upper quadrant pain, elevated liver tests. ERCP was normal. He performed biliary sphincterotomy for possiblesphincter of Oddidysfunction. Complicated by post procedure pains, event. 11/2011 EGDwith Dr. Laural Golden for epigastric pain, right upper quadrant pain. Some small polyps were removed from the stomach that were hyperplastic on pathology. The examination was otherwise normal 01/2012 endoscopic ultrasound,Dr. Ardis Hughs, referred by Dr. Jearld Adjutant for evaluation of persistent right upper quadrant pain. The examination was normal. 08/2012 ColonoscopyDr. Laural Golden, for screening, persistent abdominal pain, essentially normal. 2015 CT scan abdomenfor abdominal pain was essentially normal Labs 12/2014 cbc, cmet normal LFTs 2012-2016persistently normal except mild elevated in single trnasaminase  1. IBS -like pains: Lower abdominal pains, crampy, almost always improved with sublingual antispasmodics.   HPI: This is a very pleasant 65 year old woman   I last saw her about a year and a half ago, November 2020 she was having pyrosis and heartburn without alarm symptoms.  I recommended that she change her proton pump inhibitor so that she was taking 40 mg Protonix shortly before her breakfast meal and start H2 blocker Pepcid at bedtime every night.  We asked her to call back in about 2 months to report on her response however we never heard back from them.  Her IBS at that time was under good control taking antispasmodics on a as needed basis.  Her weight is up 7 pounds since her last office visit here, same scale.  She is actually taking pantoprazole 20 mg pills 1 pill shortly before breakfast and the other pill at bedtime.  She says this usually helps quite well for her GERD symptoms of pyrosis.  Still no dysphagia.  No overt GI bleeding.  She has intermittent spasms in her epigastrium and Levsin still seems to help quite well for  that.  She needs refills of both her proton pump inhibitor and her antispasmodic.  She also describes some lower abdominal discomforts that lasted for about 3 weeks fairly recently.  She has been fine since then.  She has never had this discomfort before.  No fevers or chills.   ROS: complete GI ROS as described in HPI, all other review negative.  Constitutional:  No unintentional weight loss   Past Medical History:  Diagnosis Date  . GERD (gastroesophageal reflux disease)   . HLD (hyperlipidemia)   . IBS (irritable bowel syndrome)   . Osteoarthritis of knee    Right; had right TKR  . Pulmonary embolism ([place])    5 yrs ago (unknown region)    Past Surgical History:  Procedure Laterality Date  . [place]   mc  . CESAREAN SECTION N/A    Phreesia 01/16/2021  . CHOLECYSTECTOMY  15 yrs ago   mc  . COLONOSCOPY  05/24/2012   Procedure: COLONOSCOPY;  Surgeon: Rogene Houston, MD;  [facility];  Service: Endoscopy;  Laterality: N/A;  220  . ERCP  08/12/2011   Procedure: ENDOSCOPIC RETROGRADE CHOLANGIOPANCREATOGRAPHY (ERCP);  Surgeon: Rogene Houston, MD;  [facility];  Service: Endoscopy;  Laterality: N/A;  . ERCP W/ SPHICTEROTOMY  08/12/11   Dr Rehman-?microlithiasis, SOD  . ESOPHAGOGASTRODUODENOSCOPY  11/25/2011   Procedure: ESOPHAGOGASTRODUODENOSCOPY (EGD);  Surgeon: Rogene Houston, MD;  [facility];  Service: Endoscopy;  Laterality: N/A;  830  . EUS  01/19/2012   Procedure: UPPER ENDOSCOPIC ULTRASOUND (EUS) LINEAR;  Surgeon: Milus Banister, MD;  [facility];  Service: Endoscopy;  Laterality: N/A;  pt moved up a hour early by AW , Patty @ [place] office ok'd / pt was called by AW  . JOINT REPLACEMENT N/A    Phreesia 01/16/2021  . SPHINCTEROTOMY  08/12/2011   Procedure: SPHINCTEROTOMY;  Surgeon: Rogene Houston, MD;  [facility];  Service: Endoscopy;;  with ballon passage  . TOTAL KNEE ARTHROPLASTY Right 09/13/2017  . TOTAL  KNEE ARTHROPLASTY Right 09/13/2017   Procedure: RIGHT TOTAL KNEE ARTHROPLASTY;  Surgeon: Mcarthur Rossetti, MD;  Location: [place];  Service: Orthopedics;  Laterality: Right;  . TUBAL LIGATION      Current Outpatient Medications  Medication Sig Dispense Refill  . aspirin EC 81 MG tablet Take 81 mg by mouth every other day. Swallow whole.    . cholecalciferol (VITAMIN D3) 25 MCG (1000 UNIT) tablet Take 1,000 Units by mouth daily.    . hyoscyamine (LEVSIN SL) 0.125 MG SL tablet Place 0.125 mg under the tongue every 4 (four) hours as needed.    Marland Kitchen ibuprofen (ADVIL,MOTRIN) 200 MG tablet Take 400 mg by mouth daily as needed for mild pain or moderate pain. Pain    . pantoprazole (PROTONIX) 20 MG tablet TAKE ONE TABLET BY MOUTH 2 TIMES A DAY. 60 tablet 9  . pravastatin (PRAVACHOL) 10 MG tablet TAKE 1 TABLET BY MOUTH ONCE DAILY. 30 tablet 0   No current facility-administered medications for this visit.    Allergies as of 01/19/2021 - Review Complete 01/19/2021  Allergen Reaction Noted  . Promethazine hcl Hives 08/14/2011  . Phenytoin Itching 01/16/2021  . Hydromorphone Itching 08/08/2011  . Macrolides and ketolides Itching 08/04/2014  . Nitrofurantoin monohyd macro Itching 01/19/2012    Family History  Problem Relation Age of Onset  . Diabetes Mother   . Heart failure Father   . Diabetes Brother   . Heart disease Sister   . Heart disease Maternal Grandmother   . Cancer Maternal Grandfather        ? pancreatic or lung  . Anesthesia problems Neg Hx   . Hypotension Neg Hx   . Malignant hyperthermia Neg Hx   . Pseudochol deficiency Neg Hx   . Stroke Neg Hx     Social History   Socioeconomic History  . Marital status: Married    Spouse name: Not on file  . Number of children: 2  . Years of education: Not on file  . Highest education level: Not on file  Occupational History  . Occupation: [employer]    Comment: Medical sales representative  Tobacco Use  . Smoking status: Never Smoker  .  Smokeless tobacco: Never Used  Vaping Use  . Vaping Use: Never used  Substance and Sexual Activity  . Alcohol use: Yes    Alcohol/week: 0.0 standard drinks    Comment: occasional wine or moonshine, but only on vacation  . Drug use: No  . Sexual activity: Yes    Birth control/protection: Post-menopausal  Other Topics Concern  . Not on file  Social History Narrative    2 daughters      Social Determinants of Health   Financial Resource Strain: Not on file  Food Insecurity: Not on file  Transportation Needs: Not on file  Physical Activity: Not on file  Stress: Not on file  Social Connections: Not on file  Intimate Partner Violence: Not on file     Physical Exam: BP 130/80   Pulse 76   Ht 5\' 3"  (1.6 m)   Wt 184 lb (83.5 kg)  LMP 05/11/2011   SpO2 96%   BMI 32.59 kg/m  Constitutional: generally well-appearing Psychiatric: alert and oriented x3 Abdomen: soft, nontender, nondistended, no obvious ascites, no peritoneal signs, normal bowel sounds No peripheral edema noted in lower extremities  Assessment and plan: 65 y.o. female with GERD without alarm symptoms, IBS  I am changing her proton pump inhibitor prescription so that she is taking 40 mg Protonix shortly before breakfast meal every day.  I am happy to refill this again in 1 year however she still needs prescription strength medicine in 2 years I would like to see her in the office.  I am also refilling her antispasmodic medicine.  This seems to help her quite well.  She had some lower abdominal pains which she feels are distinct from her IBS upper abdominal discomforts.  They last about 3 weeks, waxing and waning.  They have completely resolved and she is nontender on examination now.  She knows to call here those pains return.    Please see the "Patient Instructions" section for addition details about the plan.  Owens Loffler, MD [facility] 01/19/2021, 1:36 PM   Total time on date of encounter  was 30 minutes (this included time spent preparing to see the patient reviewing records; obtaining and/or reviewing separately obtained history; performing a medically appropriate exam and/or evaluation; counseling and educating the patient and family if present; ordering medications, tests or procedures if applicable; and documenting clinical information in the health record).

## 2021-01-19 NOTE — Assessment & Plan Note (Signed)
Lab Results  Component Value Date   CHOL 174 12/20/2019   HDL 42 (L) 12/20/2019   LDLCALC 105 (H) 12/20/2019   TRIG 154 (H) 12/20/2019   CHOLHDL 4.1 12/20/2019  -will check with next labs

## 2021-01-19 NOTE — Patient Instructions (Signed)
If you are age 63 or younger, your body mass index should be between 19-25. Your Body mass index is 32.59 kg/m. If this is out of the aformentioned range listed, please consider follow up with your Primary Care Provider.   We have sent the following medications to your pharmacy for you to pick up at your convenience:  CHANGE: pantoprazole to 40mg  take one tablet shortly before breakfast meal each day.  Levsin 0.125mg  take 1 to 2 tablets sublingual (under the tongue) every 6 hours as needed.  Thank you for entrusting me with your care and choosing [facility].  Dr Ardis Hughs

## 2021-01-19 NOTE — Assessment & Plan Note (Signed)
-  she states that she has had abdominal pain that comes and goes since bowel perforation incident -was taking hyosciamine, but is no longer taking this -is followed by Dr. Ardis Hughs with [facility]

## 2021-01-19 NOTE — Patient Instructions (Signed)
Please have fasting labs drawn 2-3 days prior to your appointment so we can discuss the results during your office visit.  

## 2021-01-19 NOTE — Assessment & Plan Note (Signed)
-  no recurrence -no anticoagulation -no concerns today

## 2021-01-29 DIAGNOSIS — E1165 Type 2 diabetes mellitus with hyperglycemia: Secondary | ICD-10-CM | POA: Diagnosis not present

## 2021-01-29 DIAGNOSIS — R7301 Impaired fasting glucose: Secondary | ICD-10-CM | POA: Diagnosis not present

## 2021-01-29 DIAGNOSIS — E785 Hyperlipidemia, unspecified: Secondary | ICD-10-CM | POA: Diagnosis not present

## 2021-01-29 DIAGNOSIS — D751 Secondary polycythemia: Secondary | ICD-10-CM | POA: Diagnosis not present

## 2021-01-29 DIAGNOSIS — F419 Anxiety disorder, unspecified: Secondary | ICD-10-CM | POA: Diagnosis not present

## 2021-01-30 LAB — CBC WITH DIFFERENTIAL/PLATELET
Basophils Absolute: 0 10*3/uL (ref 0.0–0.2)
Basos: 1 %
EOS (ABSOLUTE): 0.2 10*3/uL (ref 0.0–0.4)
Eos: 2 %
Hematocrit: 49.5 % — ABNORMAL HIGH (ref 34.0–46.6)
Hemoglobin: 16.5 g/dL — ABNORMAL HIGH (ref 11.1–15.9)
Immature Grans (Abs): 0.1 10*3/uL (ref 0.0–0.1)
Immature Granulocytes: 1 %
Lymphocytes Absolute: 2.2 10*3/uL (ref 0.7–3.1)
Lymphs: 27 %
MCH: 28.3 pg (ref 26.6–33.0)
MCHC: 33.3 g/dL (ref 31.5–35.7)
MCV: 85 fL (ref 79–97)
Monocytes Absolute: 0.6 10*3/uL (ref 0.1–0.9)
Monocytes: 7 %
Neutrophils Absolute: 5 10*3/uL (ref 1.4–7.0)
Neutrophils: 62 %
Platelets: 230 10*3/uL (ref 150–450)
RBC: 5.84 x10E6/uL — ABNORMAL HIGH (ref 3.77–5.28)
RDW: 13.8 % (ref 11.7–15.4)
WBC: 8 10*3/uL (ref 3.4–10.8)

## 2021-01-30 LAB — CMP14+EGFR
ALT: 31 IU/L (ref 0–32)
AST: 28 IU/L (ref 0–40)
Albumin/Globulin Ratio: 1.8 (ref 1.2–2.2)
Albumin: 4.3 g/dL (ref 3.8–4.8)
Alkaline Phosphatase: 97 IU/L (ref 44–121)
BUN/Creatinine Ratio: 13 (ref 12–28)
BUN: 12 mg/dL (ref 8–27)
Bilirubin Total: 0.5 mg/dL (ref 0.0–1.2)
CO2: 22 mmol/L (ref 20–29)
Calcium: 9.3 mg/dL (ref 8.7–10.3)
Chloride: 102 mmol/L (ref 96–106)
Creatinine, Ser: 0.9 mg/dL (ref 0.57–1.00)
Globulin, Total: 2.4 g/dL (ref 1.5–4.5)
Glucose: 123 mg/dL — ABNORMAL HIGH (ref 65–99)
Potassium: 4.4 mmol/L (ref 3.5–5.2)
Sodium: 141 mmol/L (ref 134–144)
Total Protein: 6.7 g/dL (ref 6.0–8.5)
eGFR: 72 mL/min/{1.73_m2} (ref >59)

## 2021-01-30 LAB — LIPID PANEL WITH LDL/HDL RATIO
Cholesterol, Total: 195 mg/dL (ref 100–199)
HDL: 47 mg/dL (ref >39)
LDL Chol Calc (NIH): 124 mg/dL — ABNORMAL HIGH (ref 0–99)
LDL/HDL Ratio: 2.6 ratio (ref 0.0–3.2)
Triglycerides: 136 mg/dL (ref 0–149)
VLDL Cholesterol Cal: 24 mg/dL (ref 5–40)

## 2021-01-30 LAB — TSH: TSH: 1.77 u[IU]/mL (ref 0.450–4.500)

## 2021-02-01 NOTE — Progress Notes (Signed)
Her RBC and Hgb are elevated, but this is her baseline.  Her cholesterol is a little elevated, so she should cut back on fried/fatty foods.

## 2021-02-03 ENCOUNTER — Ambulatory Visit (INDEPENDENT_AMBULATORY_CARE_PROVIDER_SITE_OTHER): Payer: BC Managed Care – PPO | Admitting: Nurse Practitioner

## 2021-02-03 ENCOUNTER — Encounter: Payer: Self-pay | Admitting: Nurse Practitioner

## 2021-02-03 ENCOUNTER — Other Ambulatory Visit: Payer: Self-pay

## 2021-02-03 VITALS — BP 118/78 | HR 76 | Temp 97.8°F | Resp 20 | Ht 63.0 in | Wt 185.0 lb

## 2021-02-03 DIAGNOSIS — F419 Anxiety disorder, unspecified: Secondary | ICD-10-CM | POA: Insufficient documentation

## 2021-02-03 DIAGNOSIS — R0683 Snoring: Secondary | ICD-10-CM | POA: Diagnosis not present

## 2021-02-03 DIAGNOSIS — Z0000 Encounter for general adult medical examination without abnormal findings: Secondary | ICD-10-CM

## 2021-02-03 DIAGNOSIS — E7800 Pure hypercholesterolemia, unspecified: Secondary | ICD-10-CM

## 2021-02-03 DIAGNOSIS — D751 Secondary polycythemia: Secondary | ICD-10-CM | POA: Insufficient documentation

## 2021-02-03 DIAGNOSIS — R7301 Impaired fasting glucose: Secondary | ICD-10-CM | POA: Insufficient documentation

## 2021-02-03 MED ORDER — SERTRALINE HCL 50 MG PO TABS: 50.0000 mg | ORAL_TABLET | Freq: Every day | ORAL | 3 refills | Status: DC

## 2021-02-03 MED ORDER — PRAVASTATIN SODIUM 20 MG PO TABS: 20.0000 mg | ORAL_TABLET | Freq: Every day | ORAL | 1 refills | Status: DC

## 2021-02-03 NOTE — Assessment & Plan Note (Signed)
Lab Results  Component Value Date   CHOL 195 01/29/2021   HDL 47 01/29/2021   LDLCALC 124 (H) 01/29/2021   TRIG 136 01/29/2021   CHOLHDL 4.1 12/20/2019   -INCREASE pravastatin to 20 mg qhs

## 2021-02-03 NOTE — Assessment & Plan Note (Signed)
Lab Results  Component Value Date   HGB 16.5 (H) 01/29/2021  -she isn't a smoker -has had polycythemia since at least 2012 -will check home sleep study to r/o sleep apnea

## 2021-02-03 NOTE — Assessment & Plan Note (Signed)
-  added on A1c to her labs

## 2021-02-03 NOTE — Progress Notes (Signed)
Established Patient Office Visit  Subjective:  Patient ID: Mallory [name], female    DOB: 12/25/1955  Age: 65 y.o. MRN: [id number]  CC:  Chief Complaint  Patient presents with  . Annual Exam    HPI [name] presents for physical exam.  She states that she feels jittery when she lays down to go to sleep and when she wakes up in the AM, but she doesn't have that feeling during the day.    Past Medical History:  Diagnosis Date  . GERD (gastroesophageal reflux disease)   . HLD (hyperlipidemia)   . IBS (irritable bowel syndrome)   . Osteoarthritis of knee    Right; had right TKR  . Pulmonary embolism ([place])    5 yrs ago (unknown region)    Past Surgical History:  Procedure Laterality Date  . [place]   mc  . CESAREAN SECTION N/A    [facility]reesia 01/16/2021  . CHOLECYSTECTOMY  15 yrs ago   mc  . COLONOSCOPY  05/24/2012   Procedure: COLONOSCOPY;  Surgeon: Rogene Houston, MD;  [facility];  Service: Endoscopy;  Laterality: N/A;  220  . ERCP  08/12/2011   Procedure: ENDOSCOPIC RETROGRADE CHOLANGIOPANCREATOGRAPHY (ERCP);  Surgeon: Rogene Houston, MD;  Location: [facility];  Service: Endoscopy;  Laterality: N/A;  . ERCP W/ SPHICTEROTOMY  08/12/11   Dr Rehman-?microlithiasis, SOD  . ESOPHAGOGASTRODUODENOSCOPY  11/25/2011   Procedure: ESOPHAGOGASTRODUODENOSCOPY (EGD);  Surgeon: Rogene Houston, MD;  [facility];  Service: Endoscopy;  Laterality: N/A;  830  . EUS  01/19/2012   Procedure: UPPER ENDOSCOPIC ULTRASOUND (EUS) LINEAR;  Surgeon: Milus Banister, MD;  [facility];  Service: Endoscopy;  Laterality: N/A;  pt moved up a hour early by AW , Patty @ [place] office ok'd / pt was called by AW  . JOINT REPLACEMENT N/A    [facility]reesia 01/16/2021  . SPHINCTEROTOMY  08/12/2011   Procedure: SPHINCTEROTOMY;  Surgeon: Rogene Houston, MD;  [facility]: [facility];  Service: Endoscopy;;  with ballon passage  . TOTAL KNEE ARTHROPLASTY Right 09/13/2017  . TOTAL  KNEE ARTHROPLASTY Right 09/13/2017   Procedure: RIGHT TOTAL KNEE ARTHROPLASTY;  Surgeon: Mcarthur Rossetti, MD;  Location: [facility];  Service: Orthopedics;  Laterality: Right;  . TUBAL LIGATION      Family History  Problem Relation Age of Onset  . Diabetes Mother   . Heart failure Father   . Diabetes Brother   . Heart disease Sister   . Heart disease Maternal Grandmother   . Cancer Maternal Grandfather        ? pancreatic or lung  . Anesthesia problems Neg Hx   . Hypotension Neg Hx   . Malignant hyperthermia Neg Hx   . Pseudochol deficiency Neg Hx   . Stroke Neg Hx     Social History   Socioeconomic History  . Marital status: Married    Spouse name: Not on file  . Number of children: 2  . Years of education: Not on file  . Highest education level: Not on file  Occupational History  . Occupation: [employer]    Comment: Medical sales representative  Tobacco Use  . Smoking status: Never Smoker  . Smokeless tobacco: Never Used  Vaping Use  . Vaping Use: Never used  Substance and Sexual Activity  . Alcohol use: Yes    Alcohol/week: 0.0 standard drinks    Comment: occasional wine or moonshine, but only on vacation  .  Drug use: No  . Sexual activity: Yes    Birth control/protection: Post-menopausal  Other Topics Concern  . Not on file  Social History Narrative    2 daughters      Social Determinants of Health   Financial Resource Strain: Not on file  Food Insecurity: Not on file  Transportation Needs: Not on file  Physical Activity: Not on file  Stress: Not on file  Social Connections: Not on file  Intimate Partner Violence: Not on file    Outpatient Medications Prior to Visit  Medication Sig Dispense Refill  . aspirin EC 81 MG tablet Take 81 mg by mouth every other day. Swallow whole.    . cholecalciferol (VITAMIN D3) 25 MCG (1000 UNIT) tablet Take 1,000 Units by mouth daily.    . hyoscyamine (LEVSIN SL) 0.125 MG SL tablet Take 1 to 2 tablets every 6 hours as needed 50  tablet 3  . ibuprofen (ADVIL,MOTRIN) 200 MG tablet Take 400 mg by mouth daily as needed for mild pain or moderate pain. Pain    . pantoprazole (PROTONIX) 40 MG tablet Take 1 tablet (40 mg total) by mouth daily. Take one tablet shortly before breakfast meal each day 30 tablet 11  . pravastatin (PRAVACHOL) 10 MG tablet TAKE 1 TABLET BY MOUTH ONCE DAILY. 30 tablet 0   No facility-administered medications prior to visit.    Allergies  Allergen Reactions  . Promethazine Hcl Hives  . Phenytoin Itching  . Hydromorphone Itching  . Macrolides And Ketolides Itching  . Nitrofurantoin Monohyd Macro Itching    ROS Review of Systems  Constitutional: Negative.   HENT: Negative.   Eyes: Negative.   Respiratory: Negative.   Cardiovascular: Negative.   Gastrointestinal: Negative.   Endocrine: Negative.   Genitourinary: Negative.   Musculoskeletal: Negative.   Skin: Negative.   Allergic/Immunologic: Negative.   Neurological: Negative.   Hematological: Negative.   Psychiatric/Behavioral: Positive for sleep disturbance. Negative for self-injury and suicidal ideas. The patient is nervous/anxious.        Feels "jittery on the inside" when she lays down at night and as soon as she wakes up      Objective:    Physical Exam Constitutional:      Appearance: Normal appearance.  HENT:     Head: Normocephalic and atraumatic.     Right Ear: Tympanic membrane, ear canal and external ear normal.     Left Ear: Tympanic membrane, ear canal and external ear normal.     Nose: Nose normal.     Mouth/Throat:     Mouth: Mucous membranes are moist.     Pharynx: Oropharynx is clear.  Eyes:     Extraocular Movements: Extraocular movements intact.     Conjunctiva/sclera: Conjunctivae normal.     Pupils: Pupils are equal, round, and reactive to light.  Cardiovascular:     Rate and Rhythm: Normal rate and regular rhythm.     Pulses: Normal pulses.     Heart sounds: Normal heart sounds.  Pulmonary:      Effort: Pulmonary effort is normal.     Breath sounds: Normal breath sounds.  Abdominal:     General: Abdomen is flat. Bowel sounds are normal.     Palpations: Abdomen is soft.  Musculoskeletal:        General: Normal range of motion.     Cervical back: Normal range of motion and neck supple.  Skin:    General: Skin is warm and dry.  Capillary Refill: Capillary refill takes less than 2 seconds.  Neurological:     General: No focal deficit present.     Mental Status: She is alert and oriented to person, place, and time.     Cranial Nerves: No cranial nerve deficit.     Sensory: No sensory deficit.     Motor: No weakness.     Coordination: Coordination normal.     Gait: Gait normal.  Psychiatric:        Mood and Affect: Mood normal.        Behavior: Behavior normal.        Thought Content: Thought content normal.        Judgment: Judgment normal.     Comments: GAD-7 = 9     BP 118/78   Pulse 76   Temp 97.8 F (36.6 C)   Resp 20   Ht 5' 3"  (1.6 m)   Wt 185 lb (83.9 kg)   LMP 05/11/2011   SpO2 98%   BMI 32.77 kg/m  Wt Readings from Last 3 Encounters:  02/03/21 185 lb (83.9 kg)  01/19/21 184 lb (83.5 kg)  01/19/21 184 lb (83.5 kg)     There are no preventive care reminders to display for this patient.  There are no preventive care reminders to display for this patient.  Lab Results  Component Value Date   TSH 1.770 01/29/2021   Lab Results  Component Value Date   WBC 8.0 01/29/2021   HGB 16.5 (H) 01/29/2021   HCT 49.5 (H) 01/29/2021   MCV 85 01/29/2021   PLT 230 01/29/2021   Lab Results  Component Value Date   NA 141 01/29/2021   K 4.4 01/29/2021   CO2 22 01/29/2021   GLUCOSE 123 (H) 01/29/2021   BUN 12 01/29/2021   CREATININE 0.90 01/29/2021   BILITOT 0.5 01/29/2021   ALKPHOS 97 01/29/2021   AST 28 01/29/2021   ALT 31 01/29/2021   PROT 6.7 01/29/2021   ALBUMIN 4.3 01/29/2021   CALCIUM 9.3 01/29/2021   ANIONGAP 6 11/27/2020   EGFR 72  01/29/2021   GFR 75.19 01/25/2019   Lab Results  Component Value Date   CHOL 195 01/29/2021   Lab Results  Component Value Date   HDL 47 01/29/2021   Lab Results  Component Value Date   LDLCALC 124 (H) 01/29/2021   Lab Results  Component Value Date   TRIG 136 01/29/2021   Lab Results  Component Value Date   CHOLHDL 4.1 12/20/2019   Lab Results  Component Value Date   HGBA1C 6.3 12/23/2014      Assessment & Plan:   Problem List Items Addressed This Visit      Endocrine   Impaired fasting glucose    -added on A1c to her labs        Other   HLD (hyperlipidemia)    Lab Results  Component Value Date   CHOL 195 01/29/2021   HDL 47 01/29/2021   LDLCALC 124 (H) 01/29/2021   TRIG 136 01/29/2021   CHOLHDL 4.1 12/20/2019   -INCREASE pravastatin to 20 mg qhs      Relevant Medications   pravastatin (PRAVACHOL) 20 MG tablet   Routine medical exam    -physical exam performed today      Anxiety    -GAD-7 = 9, indicating mild anxiety -Rx. Sertraline       Relevant Medications   sertraline (ZOLOFT) 50 MG tablet   Polycythemia  Lab Results  Component Value Date   HGB 16.5 (H) 01/29/2021  -she isn't a smoker -has had polycythemia since at least 2012 -will check home sleep study to r/o sleep apnea        Other Visit Diagnoses    Snoring    -  Primary   Relevant Orders   Home sleep test      Meds ordered this encounter  Medications  . sertraline (ZOLOFT) 50 MG tablet    Sig: Take 1 tablet (50 mg total) by mouth daily.    Dispense:  30 tablet    Refill:  3  . pravastatin (PRAVACHOL) 20 MG tablet    Sig: Take 1 tablet (20 mg total) by mouth daily.    Dispense:  90 tablet    Refill:  1    Follow-up: Return in about 1 month (around 03/05/2021) for Med check (sertraline).    Noreene Larsson, NP

## 2021-02-03 NOTE — Assessment & Plan Note (Signed)
-  physical exam performed today 

## 2021-02-03 NOTE — Assessment & Plan Note (Signed)
-  GAD-7 = 9, indicating mild anxiety -Rx. Sertraline

## 2021-02-05 ENCOUNTER — Other Ambulatory Visit: Payer: Self-pay | Admitting: Nurse Practitioner

## 2021-02-05 DIAGNOSIS — E1165 Type 2 diabetes mellitus with hyperglycemia: Secondary | ICD-10-CM

## 2021-02-05 LAB — HGB A1C W/O EAG: Hgb A1c MFr Bld: 6.9 % — ABNORMAL HIGH (ref 4.8–5.6)

## 2021-02-05 LAB — SPECIMEN STATUS REPORT

## 2021-02-05 MED ORDER — METFORMIN HCL 500 MG PO TABS: ORAL_TABLET | ORAL | 0 refills | Status: DC

## 2021-02-05 MED ORDER — BLOOD GLUCOSE METER KIT: PACK | 0 refills | Status: DC

## 2021-02-05 NOTE — Progress Notes (Signed)
Her A1c was 6.9%, so that is in the diabetic range.  Diabetic have a goal A1c less than 7, so she is already at goal.  To keep her blood sugar down, I sent in some metformin. She should take 1 tab per day for a week, then increase to 1 tab BID.  I also sent in a meter and supplies for her so she can check her blood sugar as needed.

## 2021-03-04 ENCOUNTER — Ambulatory Visit (INDEPENDENT_AMBULATORY_CARE_PROVIDER_SITE_OTHER): Payer: BC Managed Care – PPO | Admitting: Nurse Practitioner

## 2021-03-04 ENCOUNTER — Other Ambulatory Visit: Payer: Self-pay

## 2021-03-04 ENCOUNTER — Encounter: Payer: Self-pay | Admitting: Nurse Practitioner

## 2021-03-04 DIAGNOSIS — E119 Type 2 diabetes mellitus without complications: Secondary | ICD-10-CM | POA: Insufficient documentation

## 2021-03-04 DIAGNOSIS — E1169 Type 2 diabetes mellitus with other specified complication: Secondary | ICD-10-CM | POA: Insufficient documentation

## 2021-03-04 DIAGNOSIS — D751 Secondary polycythemia: Secondary | ICD-10-CM

## 2021-03-04 DIAGNOSIS — F419 Anxiety disorder, unspecified: Secondary | ICD-10-CM | POA: Diagnosis not present

## 2021-03-04 DIAGNOSIS — E1165 Type 2 diabetes mellitus with hyperglycemia: Secondary | ICD-10-CM | POA: Insufficient documentation

## 2021-03-04 MED ORDER — SERTRALINE HCL 50 MG PO TABS: 50.0000 mg | ORAL_TABLET | Freq: Every day | ORAL | 1 refills | Status: DC

## 2021-03-04 NOTE — Assessment & Plan Note (Signed)
-  sent order for home sleep study in again

## 2021-03-04 NOTE — Progress Notes (Addendum)
Acute Office Visit  Subjective:    Patient ID: Mallory [name], female    DOB: August 22, 1956, 65 y.o.   MRN: [id number]  Chief Complaint  Patient presents with  . Anxiety    Started sertraline at last OV    HPI Patient is in today for med check.  At her last OV she had been feeling anxious and jittery worse when she tries to sleep at night, so we started sertraline. She had GAD-7 = 9 at that time.  She states that he feels jittery and feels like she is shaking, but states none of her family members have seen her shake.  She does not have palpitations when she is feeling jittery.  At the last OV, we also ordered a home sleep test d/t snoring and polycythemia.  No results available today.    Past Medical History:  Diagnosis Date  . GERD (gastroesophageal reflux disease)   . HLD (hyperlipidemia)   . IBS (irritable bowel syndrome)   . Osteoarthritis of knee    Right; had right TKR  . Pulmonary embolism ([place])    5 yrs ago (unknown region)    Past Surgical History:  Procedure Laterality Date  . [place]   mc  . CESAREAN SECTION N/A    [facility]reesia 01/16/2021  . CHOLECYSTECTOMY  15 yrs ago   mc  . COLONOSCOPY  05/24/2012   Procedure: COLONOSCOPY;  Surgeon: Rogene Houston, MD;  [facility];  Service: Endoscopy;  Laterality: N/A;  220  . ERCP  08/12/2011   Procedure: ENDOSCOPIC RETROGRADE CHOLANGIOPANCREATOGRAPHY (ERCP);  Surgeon: Rogene Houston, MD;  [facility];  Service: Endoscopy;  Laterality: N/A;  . ERCP W/ SPHICTEROTOMY  08/12/11   Dr Rehman-?microlithiasis, SOD  . ESOPHAGOGASTRODUODENOSCOPY  11/25/2011   Procedure: ESOPHAGOGASTRODUODENOSCOPY (EGD);  Surgeon: Rogene Houston, MD;  [facility];  Service: Endoscopy;  Laterality: N/A;  830  . EUS  01/19/2012   Procedure: UPPER ENDOSCOPIC ULTRASOUND (EUS) LINEAR;  Surgeon: Milus Banister, MD;  [facility];  Service: Endoscopy;  Laterality: N/A;  pt moved up a hour early by AW ,  Patty @ [place] office ok'd / pt was called by AW  . JOINT REPLACEMENT N/A    Phreesia 01/16/2021  . SPHINCTEROTOMY  08/12/2011   Procedure: SPHINCTEROTOMY;  Surgeon: Rogene Houston, MD;  [facility];  Service: Endoscopy;;  with ballon passage  . TOTAL KNEE ARTHROPLASTY Right 09/13/2017  . TOTAL KNEE ARTHROPLASTY Right 09/13/2017   Procedure: RIGHT TOTAL KNEE ARTHROPLASTY;  Surgeon: Mcarthur Rossetti, MD;  Location: [facility];  Service: Orthopedics;  Laterality: Right;  . TUBAL LIGATION      Family History  Problem Relation Age of Onset  . Diabetes Mother   . Heart failure Father   . Diabetes Brother   . Heart disease Sister   . Heart disease Maternal Grandmother   . Cancer Maternal Grandfather        ? pancreatic or lung  . Anesthesia problems Neg Hx   . Hypotension Neg Hx   . Malignant hyperthermia Neg Hx   . Pseudochol deficiency Neg Hx   . Stroke Neg Hx     Social History   Socioeconomic History  . Marital status: Married    Spouse name: Not on file  . Number of children: 2  . Years of education: Not on file  . Highest education level: Not on file  Occupational History  .  Occupation: [employer]    Comment: Medical sales representative  Tobacco Use  . Smoking status: Never Smoker  . Smokeless tobacco: Never Used  Vaping Use  . Vaping Use: Never used  Substance and Sexual Activity  . Alcohol use: Yes    Alcohol/week: 0.0 standard drinks    Comment: occasional wine or moonshine, but only on vacation  . Drug use: No  . Sexual activity: Yes    Birth control/protection: Post-menopausal  Other Topics Concern  . Not on file  Social History Narrative    2 daughters      Social Determinants of Health   Financial Resource Strain: Not on file  Food Insecurity: Not on file  Transportation Needs: Not on file  Physical Activity: Not on file  Stress: Not on file  Social Connections: Not on file  Intimate Partner Violence: Not on file    Outpatient Medications Prior to Visit   Medication Sig Dispense Refill  . aspirin EC 81 MG tablet Take 81 mg by mouth every other day. Swallow whole.    . blood glucose meter kit and supplies Dispense based on patient and insurance preference. Use up to four times daily as directed. (FOR ICD-10 E10.9, E11.9). 1 each 0  . cholecalciferol (VITAMIN D3) 25 MCG (1000 UNIT) tablet Take 1,000 Units by mouth daily.    . hyoscyamine (LEVSIN SL) 0.125 MG SL tablet Take 1 to 2 tablets every 6 hours as needed 50 tablet 3  . ibuprofen (ADVIL,MOTRIN) 200 MG tablet Take 400 mg by mouth daily as needed for mild pain or moderate pain. Pain    . pantoprazole (PROTONIX) 40 MG tablet Take 1 tablet (40 mg total) by mouth daily. Take one tablet shortly before breakfast meal each day 30 tablet 11  . pravastatin (PRAVACHOL) 20 MG tablet Take 1 tablet (20 mg total) by mouth daily. 90 tablet 1  . sertraline (ZOLOFT) 50 MG tablet Take 1 tablet (50 mg total) by mouth daily. 30 tablet 3  . metFORMIN (GLUCOPHAGE) 500 MG tablet Take 1 tablet (500 mg total) by mouth 2 (two) times daily with a meal for 7 days, THEN 2 tablets (1,000 mg total) 2 (two) times daily with a meal. (Patient not taking: Reported on 03/04/2021) 346 tablet 0   No facility-administered medications prior to visit.    Allergies  Allergen Reactions  . Promethazine Hcl Hives  . Phenytoin Itching  . Hydromorphone Itching  . Macrolides And Ketolides Itching  . Nitrofurantoin Monohyd Macro Itching    Review of Systems  Constitutional: Negative.   Respiratory: Negative.   Cardiovascular: Negative.   Psychiatric/Behavioral: Negative for self-injury and suicidal ideas. The patient is nervous/anxious.        Feels jittery, worse when waking and going to bed       Objective:    Physical Exam Constitutional:      Appearance: Normal appearance.  Cardiovascular:     Rate and Rhythm: Normal rate and regular rhythm.     Pulses: Normal pulses.     Heart sounds: Normal heart sounds.   Pulmonary:     Effort: Pulmonary effort is normal.  Neurological:     Mental Status: She is alert.  Psychiatric:        Mood and Affect: Mood normal.        Behavior: Behavior normal.        Thought Content: Thought content normal.        Judgment: Judgment normal.     BP 123/69  Pulse 63   Temp 98.6 F (37 C)   Resp 20   Ht 5' 3" (1.6 m)   Wt 177 lb (80.3 kg)   LMP 05/11/2011   SpO2 96%   BMI 31.35 kg/m  Wt Readings from Last 3 Encounters:  03/04/21 177 lb (80.3 kg)  02/03/21 185 lb (83.9 kg)  01/19/21 184 lb (83.5 kg)    Health Maintenance Due  Topic Date Due  . PNEUMOCOCCAL POLYSACCHARIDE VACCINE AGE 41-64 HIGH RISK  Never done  . FOOT EXAM  Never done  . OPHTHALMOLOGY EXAM  Never done  . URINE MICROALBUMIN  Never done  . Zoster Vaccines- Shingrix (1 of 2) Never done  . PAP SMEAR-Modifier  05/26/2019  . COVID-19 Vaccine (3 - Booster for Moderna series) 06/11/2020    There are no preventive care reminders to display for this patient.   Lab Results  Component Value Date   TSH 1.770 01/29/2021   Lab Results  Component Value Date   WBC 8.0 01/29/2021   HGB 16.5 (H) 01/29/2021   HCT 49.5 (H) 01/29/2021   MCV 85 01/29/2021   PLT 230 01/29/2021   Lab Results  Component Value Date   NA 141 01/29/2021   K 4.4 01/29/2021   CO2 22 01/29/2021   GLUCOSE 123 (H) 01/29/2021   BUN 12 01/29/2021   CREATININE 0.90 01/29/2021   BILITOT 0.5 01/29/2021   ALKPHOS 97 01/29/2021   AST 28 01/29/2021   ALT 31 01/29/2021   PROT 6.7 01/29/2021   ALBUMIN 4.3 01/29/2021   CALCIUM 9.3 01/29/2021   ANIONGAP 6 11/27/2020   EGFR 72 01/29/2021   GFR 75.19 01/25/2019   Lab Results  Component Value Date   CHOL 195 01/29/2021   Lab Results  Component Value Date   HDL 47 01/29/2021   Lab Results  Component Value Date   LDLCALC 124 (H) 01/29/2021   Lab Results  Component Value Date   TRIG 136 01/29/2021   Lab Results  Component Value Date   CHOLHDL 4.1  12/20/2019   Lab Results  Component Value Date   HGBA1C 6.9 (H) 01/29/2021       Assessment & Plan:   Problem List Items Addressed This Visit      Endocrine   Type 2 diabetes mellitus with hyperglycemia ([place])    Wt Readings from Last 3 Encounters:  03/04/21 177 lb (80.3 kg)  02/03/21 185 lb (83.9 kg)  01/19/21 184 lb (83.5 kg)   -down 8 pounds since her last visit; she has been walking stairs at her work and eating low carb diet -blood sugar journal looks great -recheck A1c in 2 months      Relevant Orders   Hemoglobin A1c   Microalbumin / creatinine urine ratio   Lipid Panel With LDL/HDL Ratio     Other   Anxiety    -GAD-7 improved to 4 from 9 -continue sertraline -she still feels jittery when waking up or going to bed; resent referral for home sleep study      Relevant Medications   sertraline (ZOLOFT) 50 MG tablet   Polycythemia    -sent order for home sleep study in again      Relevant Orders   CBC with Differential/Platelet   CMP14+EGFR       Meds ordered this encounter  Medications  . sertraline (ZOLOFT) 50 MG tablet    Sig: Take 1 tablet (50 mg total) by mouth daily.    Dispense:  90  tablet    Refill:  [place], NP

## 2021-03-04 NOTE — Patient Instructions (Signed)
Please have fasting labs drawn 2-3 days prior to your appointment so we can discuss the results during your office visit.  

## 2021-03-04 NOTE — Assessment & Plan Note (Signed)
Wt Readings from Last 3 Encounters:  03/04/21 177 lb (80.3 kg)  02/03/21 185 lb (83.9 kg)  01/19/21 184 lb (83.5 kg)   -down 8 pounds since her last visit; she has been walking stairs at her work and eating low carb diet -blood sugar journal looks great -recheck A1c in 2 months

## 2021-03-04 NOTE — Assessment & Plan Note (Signed)
-  GAD-7 improved to 4 from 9 -continue sertraline -she still feels jittery when waking up or going to bed; resent referral for home sleep study

## 2021-03-05 ENCOUNTER — Other Ambulatory Visit: Payer: Self-pay

## 2021-03-05 DIAGNOSIS — D751 Secondary polycythemia: Secondary | ICD-10-CM

## 2021-03-05 DIAGNOSIS — R0683 Snoring: Secondary | ICD-10-CM

## 2021-03-05 MED ORDER — UNABLE TO FIND: 0 refills | Status: AC

## 2021-03-18 ENCOUNTER — Other Ambulatory Visit: Payer: Self-pay | Admitting: Nurse Practitioner

## 2021-04-07 ENCOUNTER — Other Ambulatory Visit: Payer: Self-pay

## 2021-04-07 DIAGNOSIS — R0683 Snoring: Secondary | ICD-10-CM

## 2021-04-07 DIAGNOSIS — G4733 Obstructive sleep apnea (adult) (pediatric): Secondary | ICD-10-CM

## 2021-04-27 DIAGNOSIS — G4733 Obstructive sleep apnea (adult) (pediatric): Secondary | ICD-10-CM | POA: Diagnosis not present

## 2021-05-05 ENCOUNTER — Telehealth: Payer: Self-pay

## 2021-05-05 NOTE — Telephone Encounter (Signed)
Larene Beach May called from [place] setup patient with sleep study results first time setup for this patient and asking if he has any questions. Shannon call back # [phone].

## 2021-05-05 NOTE — Telephone Encounter (Signed)
Do you have any questions? Pt does not have any questions regarding this.

## 2021-05-05 NOTE — Telephone Encounter (Signed)
Yes. Where are the results? Has patient been set up with CPAP, if indicated?

## 2021-05-06 NOTE — Telephone Encounter (Signed)
Spoke with Larene Beach, she is going to fax this report over. Pt has already gotten her cpap and has been using it for several days now fyi.

## 2021-05-10 DIAGNOSIS — E1165 Type 2 diabetes mellitus with hyperglycemia: Secondary | ICD-10-CM | POA: Diagnosis not present

## 2021-05-10 DIAGNOSIS — D751 Secondary polycythemia: Secondary | ICD-10-CM | POA: Diagnosis not present

## 2021-05-11 NOTE — Progress Notes (Signed)
Labs are looking better. RBC count is improving, and A1c is much, much better. Cholesterol has improved some too, and we will discuss that at the appointment.

## 2021-05-12 LAB — HEMOGLOBIN A1C
Est. average glucose Bld gHb Est-mCnc: 114 mg/dL
Hgb A1c MFr Bld: 5.6 % (ref 4.8–5.6)

## 2021-05-12 LAB — CMP14+EGFR
ALT: 27 IU/L (ref 0–32)
AST: 26 IU/L (ref 0–40)
Albumin/Globulin Ratio: 1.6 (ref 1.2–2.2)
Albumin: 4.1 g/dL (ref 3.8–4.8)
Alkaline Phosphatase: 111 IU/L (ref 44–121)
BUN/Creatinine Ratio: 19 (ref 12–28)
BUN: 13 mg/dL (ref 8–27)
Bilirubin Total: 0.5 mg/dL (ref 0.0–1.2)
CO2: 23 mmol/L (ref 20–29)
Calcium: 9.5 mg/dL (ref 8.7–10.3)
Chloride: 103 mmol/L (ref 96–106)
Creatinine, Ser: 0.67 mg/dL (ref 0.57–1.00)
Globulin, Total: 2.5 g/dL (ref 1.5–4.5)
Glucose: 79 mg/dL (ref 65–99)
Potassium: 4.2 mmol/L (ref 3.5–5.2)
Sodium: 142 mmol/L (ref 134–144)
Total Protein: 6.6 g/dL (ref 6.0–8.5)
eGFR: 99 mL/min/{1.73_m2} (ref >59)

## 2021-05-12 LAB — CBC WITH DIFFERENTIAL/PLATELET
Basophils Absolute: 0.1 10*3/uL (ref 0.0–0.2)
Basos: 1 %
EOS (ABSOLUTE): 0.2 10*3/uL (ref 0.0–0.4)
Eos: 3 %
Hematocrit: 46.4 % (ref 34.0–46.6)
Hemoglobin: 15.1 g/dL (ref 11.1–15.9)
Immature Grans (Abs): 0 10*3/uL (ref 0.0–0.1)
Immature Granulocytes: 0 %
Lymphocytes Absolute: 2 10*3/uL (ref 0.7–3.1)
Lymphs: 35 %
MCH: 26.9 pg (ref 26.6–33.0)
MCHC: 32.5 g/dL (ref 31.5–35.7)
MCV: 83 fL (ref 79–97)
Monocytes Absolute: 0.6 10*3/uL (ref 0.1–0.9)
Monocytes: 10 %
Neutrophils Absolute: 2.9 10*3/uL (ref 1.4–7.0)
Neutrophils: 51 %
Platelets: 185 10*3/uL (ref 150–450)
RBC: 5.61 x10E6/uL — ABNORMAL HIGH (ref 3.77–5.28)
RDW: 14.2 % (ref 11.7–15.4)
WBC: 5.7 10*3/uL (ref 3.4–10.8)

## 2021-05-12 LAB — MICROALBUMIN / CREATININE URINE RATIO
Creatinine, Urine: 79.1 mg/dL
Microalb/Creat Ratio: 6 mg/g creat (ref 0–29)
Microalbumin, Urine: 4.4 ug/mL

## 2021-05-12 LAB — LIPID PANEL WITH LDL/HDL RATIO
Cholesterol, Total: 168 mg/dL (ref 100–199)
HDL: 44 mg/dL (ref >39)
LDL Chol Calc (NIH): 108 mg/dL — ABNORMAL HIGH (ref 0–99)
LDL/HDL Ratio: 2.5 ratio (ref 0.0–3.2)
Triglycerides: 86 mg/dL (ref 0–149)
VLDL Cholesterol Cal: 16 mg/dL (ref 5–40)

## 2021-05-13 ENCOUNTER — Encounter: Payer: Self-pay | Admitting: Nurse Practitioner

## 2021-05-13 ENCOUNTER — Ambulatory Visit (INDEPENDENT_AMBULATORY_CARE_PROVIDER_SITE_OTHER): Payer: BC Managed Care – PPO | Admitting: Nurse Practitioner

## 2021-05-13 ENCOUNTER — Other Ambulatory Visit: Payer: Self-pay

## 2021-05-13 VITALS — BP 117/73 | HR 54 | Ht 63.0 in | Wt 159.0 lb

## 2021-05-13 DIAGNOSIS — D751 Secondary polycythemia: Secondary | ICD-10-CM | POA: Diagnosis not present

## 2021-05-13 DIAGNOSIS — F419 Anxiety disorder, unspecified: Secondary | ICD-10-CM

## 2021-05-13 DIAGNOSIS — E7800 Pure hypercholesterolemia, unspecified: Secondary | ICD-10-CM

## 2021-05-13 DIAGNOSIS — E1165 Type 2 diabetes mellitus with hyperglycemia: Secondary | ICD-10-CM | POA: Diagnosis not present

## 2021-05-13 DIAGNOSIS — G4733 Obstructive sleep apnea (adult) (pediatric): Secondary | ICD-10-CM | POA: Diagnosis not present

## 2021-05-13 DIAGNOSIS — G4730 Sleep apnea, unspecified: Secondary | ICD-10-CM | POA: Insufficient documentation

## 2021-05-13 MED ORDER — PRAVASTATIN SODIUM 40 MG PO TABS: 40.0000 mg | ORAL_TABLET | Freq: Every day | ORAL | 3 refills | Status: DC

## 2021-05-13 NOTE — Progress Notes (Signed)
Acute Office Visit  Subjective:    Patient ID: Mallory [name], female    DOB: Aug 14, 1956, 65 y.o.   MRN: [id number]  Chief Complaint  Patient presents with   Follow-up    Lab follow up    HPI Patient is in today for lab follow-up for DM and polycythemia.  She has been exercising and lost 18 pounds since her last visit.     Past Medical History:  Diagnosis Date   GERD (gastroesophageal reflux disease)    HLD (hyperlipidemia)    IBS (irritable bowel syndrome)    Osteoarthritis of knee    Right; had right TKR   Pulmonary embolism (HCC)    5 yrs ago (unknown region)    Past Surgical History:  Procedure Laterality Date   CESAREAN SECTION  1991   [facility]   CESAREAN SECTION N/A    [employer] 01/16/2021   CHOLECYSTECTOMY  15 yrs ago   mc   COLONOSCOPY  05/24/2012   Procedure: COLONOSCOPY;  Surgeon: Rogene Houston, MD;  [facility];  Service: Endoscopy;  Laterality: N/A;  220   ERCP  08/12/2011   Procedure: ENDOSCOPIC RETROGRADE CHOLANGIOPANCREATOGRAPHY (ERCP);  Surgeon: Rogene Houston, MD;  Location: [facility];  Service: Endoscopy;  Laterality: N/A;   ERCP W/ SPHICTEROTOMY  08/12/11   Dr Rehman-?microlithiasis, SOD   ESOPHAGOGASTRODUODENOSCOPY  11/25/2011   Procedure: ESOPHAGOGASTRODUODENOSCOPY (EGD);  Surgeon: Rogene Houston, MD;  [facility];  Service: Endoscopy;  Laterality: N/A;  830   EUS  01/19/2012   Procedure: UPPER ENDOSCOPIC ULTRASOUND (EUS) LINEAR;  Surgeon: Milus Banister, MD;  [facility]: [facility];  Service: Endoscopy;  Laterality: N/A;  pt moved up a hour early by AW , Patty @ [place] office ok'd / pt was called by [place] 01/16/2021   SPHINCTEROTOMY  08/12/2011   Procedure: Joan Mayans;  Surgeon: Rogene Houston, MD;  Location: [facility];  Service: Endoscopy;;  with ballon passage   TOTAL KNEE ARTHROPLASTY Right 09/13/2017   TOTAL KNEE ARTHROPLASTY Right 09/13/2017   Procedure: RIGHT TOTAL KNEE ARTHROPLASTY;   Surgeon: Mcarthur Rossetti, MD;  Location: [facility];  Service: Orthopedics;  Laterality: Right;   TUBAL LIGATION      Family History  Problem Relation Age of Onset   Diabetes Mother    Heart failure Father    Diabetes Brother    Heart disease Sister    Heart disease Maternal Grandmother    Cancer Maternal Grandfather        ? pancreatic or lung   Anesthesia problems Neg Hx    Hypotension Neg Hx    Malignant hyperthermia Neg Hx    Pseudochol deficiency Neg Hx    Stroke Neg Hx     Social History   Socioeconomic History   Marital status: Married    Spouse name: Not on file   Number of children: 2   Years of education: Not on file   Highest education level: Not on file  Occupational History   Occupation: [employer]    Comment: Medical sales representative  Tobacco Use   Smoking status: Never   Smokeless tobacco: Never  Vaping Use   Vaping Use: Never used  Substance and Sexual Activity   Alcohol use: Yes    Alcohol/week: 0.0 standard drinks    Comment: occasional wine or moonshine, but only on vacation   Drug use: No   Sexual activity: Yes  Birth control/protection: Post-menopausal  Other Topics Concern   Not on file  Social History Narrative    2 daughters      Social Determinants of Health   Financial Resource Strain: Not on file  Food Insecurity: Not on file  Transportation Needs: Not on file  Physical Activity: Not on file  Stress: Not on file  Social Connections: Not on file  Intimate Partner Violence: Not on file    Outpatient Medications Prior to Visit  Medication Sig Dispense Refill   aspirin EC 81 MG tablet Take 81 mg by mouth every other day. Swallow whole.     blood glucose meter kit and supplies Dispense based on patient and insurance preference. Use up to four times daily as directed. (FOR ICD-10 E10.9, E11.9). 1 each 0   cholecalciferol (VITAMIN D3) 25 MCG (1000 UNIT) tablet Take 1,000 Units by mouth daily.     CONTOUR NEXT TEST test strip USE TO CHECK  BLOOD SUGAR FOUR TIMES DAILY AS DIRECTED 100 strip 0   hyoscyamine (LEVSIN SL) 0.125 MG SL tablet Take 1 to 2 tablets every 6 hours as needed 50 tablet 3   ibuprofen (ADVIL,MOTRIN) 200 MG tablet Take 400 mg by mouth daily as needed for mild pain or moderate pain. Pain     Microlet Lancets MISC USE TO CHECK BLOOD SUGAR FOUR TIMES DAILY AS DIRECTED 100 each 0   pantoprazole (PROTONIX) 40 MG tablet Take 1 tablet (40 mg total) by mouth daily. Take one tablet shortly before breakfast meal each day 30 tablet 11   sertraline (ZOLOFT) 50 MG tablet Take 1 tablet (50 mg total) by mouth daily. 90 tablet 1   UNABLE TO FIND Home Sleep Study Kit - use as directed to detect sleep apnea Dx: D75.1; R06.83 1 each 0   pravastatin (PRAVACHOL) 20 MG tablet Take 1 tablet (20 mg total) by mouth daily. 90 tablet 1   metFORMIN (GLUCOPHAGE) 500 MG tablet Take 1 tablet (500 mg total) by mouth 2 (two) times daily with a meal for 7 days, THEN 2 tablets (1,000 mg total) 2 (two) times daily with a meal. (Patient not taking: Reported on 03/04/2021) 346 tablet 0   No facility-administered medications prior to visit.    Allergies  Allergen Reactions   Promethazine Hcl Hives   Phenytoin Itching   Hydromorphone Itching   Macrolides And Ketolides Itching   Nitrofurantoin Monohyd Macro Itching    Review of Systems  Constitutional: Negative.   Respiratory: Negative.    Cardiovascular: Negative.   Musculoskeletal: Negative.   Psychiatric/Behavioral: Negative.        Objective:    Physical Exam Constitutional:      Appearance: Normal appearance.  Cardiovascular:     Rate and Rhythm: Normal rate and regular rhythm.     Pulses: Normal pulses.     Heart sounds: Normal heart sounds.  Pulmonary:     Effort: Pulmonary effort is normal.     Breath sounds: Normal breath sounds.  Musculoskeletal:        General: Normal range of motion.  Neurological:     Mental Status: She is alert.  Psychiatric:        Mood and  Affect: Mood normal.        Behavior: Behavior normal.        Thought Content: Thought content normal.        Judgment: Judgment normal.    BP 117/73 (BP Location: Left Arm, Patient Position: Sitting, Cuff Size: Normal)  Pulse (!) 54   Ht 5' 3"  (1.6 m)   Wt 159 lb (72.1 kg)   LMP 05/11/2011   SpO2 95%   BMI 28.17 kg/m  Wt Readings from Last 3 Encounters:  05/13/21 159 lb (72.1 kg)  03/04/21 177 lb (80.3 kg)  02/03/21 185 lb (83.9 kg)    Health Maintenance Due  Topic Date Due   PNEUMOCOCCAL POLYSACCHARIDE VACCINE AGE 65-64 HIGH RISK  Never done   FOOT EXAM  Never done   OPHTHALMOLOGY EXAM  Never done   Zoster Vaccines- Shingrix (1 of 2) Never done   PAP SMEAR-Modifier  05/26/2019   INFLUENZA VACCINE  05/10/2021    There are no preventive care reminders to display for this patient.   Lab Results  Component Value Date   TSH 1.770 01/29/2021   Lab Results  Component Value Date   WBC 5.7 05/10/2021   HGB 15.1 05/10/2021   HCT 46.4 05/10/2021   MCV 83 05/10/2021   PLT 185 05/10/2021   Lab Results  Component Value Date   NA 142 05/10/2021   K 4.2 05/10/2021   CO2 23 05/10/2021   GLUCOSE 79 05/10/2021   BUN 13 05/10/2021   CREATININE 0.67 05/10/2021   BILITOT 0.5 05/10/2021   ALKPHOS 111 05/10/2021   AST 26 05/10/2021   ALT 27 05/10/2021   PROT 6.6 05/10/2021   ALBUMIN 4.1 05/10/2021   CALCIUM 9.5 05/10/2021   ANIONGAP 6 11/27/2020   EGFR 99 05/10/2021   GFR 75.19 01/25/2019   Lab Results  Component Value Date   CHOL 168 05/10/2021   Lab Results  Component Value Date   HDL 44 05/10/2021   Lab Results  Component Value Date   LDLCALC 108 (H) 05/10/2021   Lab Results  Component Value Date   TRIG 86 05/10/2021   Lab Results  Component Value Date   CHOLHDL 4.1 12/20/2019   Lab Results  Component Value Date   HGBA1C 5.6 05/10/2021       Assessment & Plan:   Problem List Items Addressed This Visit       Respiratory   Sleep apnea     -CPAP managed by [facility] -she is adjusting to sleeping with CPAP, but states she is sleeping better -RBC count is trending down, so she is likely oxygenating much better at night       Relevant Orders   CBC with Differential/Platelet     Endocrine   Type 2 diabetes mellitus with hyperglycemia ([place]) - Primary    Lab Results  Component Value Date   HGBA1C 5.6 05/10/2021  -no longer taking metformin -microalbumin was fine, no current ACEi/ARB      Relevant Medications   pravastatin (PRAVACHOL) 40 MG tablet   Other Relevant Orders   CBC with Differential/Platelet   CMP14+EGFR   Lipid Panel With LDL/HDL Ratio   Hemoglobin A1c   Microalbumin / creatinine urine ratio     Other   HLD (hyperlipidemia)    Lab Results  Component Value Date   CHOL 168 05/10/2021   HDL 44 05/10/2021   LDLCALC 108 (H) 05/10/2021   TRIG 86 05/10/2021   CHOLHDL 4.1 12/20/2019  -has made drastic diet changes and LDL ahs improved some -goal LDL < 70 -INCREASE pravastatin to 40 mg      Relevant Medications   pravastatin (PRAVACHOL) 40 MG tablet   Other Relevant Orders   Lipid Panel With LDL/HDL Ratio   Anxiety    -doing  well -no med changes       Polycythemia    Lab Results  Component Value Date   WBC 5.7 05/10/2021   HGB 15.1 05/10/2021   HCT 46.4 05/10/2021   MCV 83 05/10/2021   PLT 185 05/10/2021  -RBC slightly elevated, trending down -was started on CPAP       Relevant Orders   CBC with Differential/Platelet     Meds ordered this encounter  Medications   pravastatin (PRAVACHOL) 40 MG tablet    Sig: Take 1 tablet (40 mg total) by mouth daily.    Dispense:  90 tablet    Refill:  3    Increased dose today (05/13/21)     Noreene Larsson, NP

## 2021-05-13 NOTE — Assessment & Plan Note (Signed)
Lab Results  Component Value Date   WBC 5.7 05/10/2021   HGB 15.1 05/10/2021   HCT 46.4 05/10/2021   MCV 83 05/10/2021   PLT 185 05/10/2021  -RBC slightly elevated, trending down -was started on CPAP

## 2021-05-13 NOTE — Patient Instructions (Signed)
Please have fasting labs drawn 2-3 days prior to your appointment so we can discuss the results during your office visit.  

## 2021-05-13 NOTE — Assessment & Plan Note (Signed)
Lab Results  Component Value Date   CHOL 168 05/10/2021   HDL 44 05/10/2021   LDLCALC 108 (H) 05/10/2021   TRIG 86 05/10/2021   CHOLHDL 4.1 12/20/2019   -has made drastic diet changes and LDL ahs improved some -goal LDL < 70 -INCREASE pravastatin to 40 mg

## 2021-05-13 NOTE — Assessment & Plan Note (Signed)
-  CPAP managed by [facility] -she is adjusting to sleeping with CPAP, but states she is sleeping better -RBC count is trending down, so she is likely oxygenating much better at night

## 2021-05-13 NOTE — Assessment & Plan Note (Signed)
-  doing well -no med changes

## 2021-05-13 NOTE — Assessment & Plan Note (Addendum)
Lab Results  Component Value Date   HGBA1C 5.6 05/10/2021   -no longer taking metformin -microalbumin was fine, no current ACEi/ARB

## 2021-05-14 ENCOUNTER — Other Ambulatory Visit: Payer: Self-pay | Admitting: Nurse Practitioner

## 2021-05-14 ENCOUNTER — Telehealth: Payer: Self-pay | Admitting: Nurse Practitioner

## 2021-05-14 DIAGNOSIS — E1165 Type 2 diabetes mellitus with hyperglycemia: Secondary | ICD-10-CM

## 2021-05-14 MED ORDER — BLOOD GLUCOSE METER KIT: PACK | 0 refills | Status: DC

## 2021-05-14 NOTE — Telephone Encounter (Signed)
Pt informed via detailed voicemail.

## 2021-05-14 NOTE — Telephone Encounter (Signed)
I sent in strips and lancets. Her A1c was great, so she doesn't have to check daily. Just if she suspects something is off, or maybe once per week to make sure her blood sugar is not out of whack.

## 2021-05-14 NOTE — Telephone Encounter (Signed)
Does the pt need to continue to check her sugar. If so she needs more supplies

## 2021-05-28 DIAGNOSIS — G4733 Obstructive sleep apnea (adult) (pediatric): Secondary | ICD-10-CM | POA: Diagnosis not present

## 2021-06-28 DIAGNOSIS — G4733 Obstructive sleep apnea (adult) (pediatric): Secondary | ICD-10-CM | POA: Diagnosis not present

## 2021-07-08 ENCOUNTER — Other Ambulatory Visit: Payer: Self-pay | Admitting: Nurse Practitioner

## 2021-07-08 DIAGNOSIS — F419 Anxiety disorder, unspecified: Secondary | ICD-10-CM

## 2021-07-28 DIAGNOSIS — G4733 Obstructive sleep apnea (adult) (pediatric): Secondary | ICD-10-CM | POA: Diagnosis not present

## 2021-08-09 ENCOUNTER — Other Ambulatory Visit: Payer: Self-pay | Admitting: Nurse Practitioner

## 2021-08-09 DIAGNOSIS — F419 Anxiety disorder, unspecified: Secondary | ICD-10-CM

## 2021-08-18 ENCOUNTER — Other Ambulatory Visit: Payer: Self-pay

## 2021-08-18 ENCOUNTER — Ambulatory Visit (INDEPENDENT_AMBULATORY_CARE_PROVIDER_SITE_OTHER): Payer: BC Managed Care – PPO

## 2021-08-18 ENCOUNTER — Ambulatory Visit: Payer: BLUE CROSS/BLUE SHIELD

## 2021-08-18 DIAGNOSIS — Z23 Encounter for immunization: Secondary | ICD-10-CM | POA: Diagnosis not present

## 2021-08-18 LAB — HM DIABETES EYE EXAM

## 2021-08-28 DIAGNOSIS — G4733 Obstructive sleep apnea (adult) (pediatric): Secondary | ICD-10-CM | POA: Diagnosis not present

## 2021-08-31 ENCOUNTER — Other Ambulatory Visit: Payer: Self-pay | Admitting: *Deleted

## 2021-08-31 MED ORDER — MICROLET LANCETS MISC: 0 refills | Status: DC

## 2021-08-31 MED ORDER — CONTOUR NEXT TEST VI STRP: ORAL_STRIP | 0 refills | Status: DC

## 2021-09-09 ENCOUNTER — Other Ambulatory Visit: Payer: Self-pay | Admitting: Nurse Practitioner

## 2021-09-09 DIAGNOSIS — F419 Anxiety disorder, unspecified: Secondary | ICD-10-CM

## 2021-09-13 ENCOUNTER — Ambulatory Visit: Payer: BC Managed Care – PPO | Admitting: Nurse Practitioner

## 2021-09-15 ENCOUNTER — Ambulatory Visit (INDEPENDENT_AMBULATORY_CARE_PROVIDER_SITE_OTHER): Payer: BC Managed Care – PPO | Admitting: *Deleted

## 2021-09-15 ENCOUNTER — Ambulatory Visit (INDEPENDENT_AMBULATORY_CARE_PROVIDER_SITE_OTHER): Payer: BC Managed Care – PPO | Admitting: Internal Medicine

## 2021-09-15 ENCOUNTER — Other Ambulatory Visit: Payer: Self-pay

## 2021-09-15 ENCOUNTER — Encounter: Payer: Self-pay | Admitting: Internal Medicine

## 2021-09-15 VITALS — Temp 101.7°F

## 2021-09-15 DIAGNOSIS — J309 Allergic rhinitis, unspecified: Secondary | ICD-10-CM | POA: Diagnosis not present

## 2021-09-15 DIAGNOSIS — J069 Acute upper respiratory infection, unspecified: Secondary | ICD-10-CM

## 2021-09-15 LAB — POCT INFLUENZA A/B
Influenza A, POC: NEGATIVE
Influenza B, POC: NEGATIVE

## 2021-09-15 MED ORDER — NOREL AD 4-10-325 MG PO TABS: 1.0000 | ORAL_TABLET | Freq: Three times a day (TID) | ORAL | 0 refills | Status: DC | PRN

## 2021-09-15 NOTE — Progress Notes (Signed)
Virtual Visit via Telephone Note   This visit type was conducted due to national recommendations for restrictions regarding the COVID-19 Pandemic (e.g. social distancing) in an effort to limit this patient's exposure and mitigate transmission in our community.  Due to her co-morbid illnesses, this patient is at least at moderate risk for complications without adequate follow up.  This format is felt to be most appropriate for this patient at this time.  The patient did not have access to video technology/had technical difficulties with video requiring transitioning to audio format only (telephone).  All issues noted in this document were discussed and addressed.  No physical exam could be performed with this format.  Evaluation Performed:  Follow-up visit  Date:  09/15/2021   ID:  Mallory [name], DOB [birth year], MRN [id number]  Patient Location: Home Provider Location: Office/Clinic  Participants: Patient Location of Patient: Home Location of Provider: Telehealth Consent was obtain for visit to be over via telehealth. I verified that I am speaking with the correct person using two identifiers.  PCP:  Noreene Larsson, NP   Chief Complaint: Cough, sore throat, fever and runny nose  History of Present Illness:    Mallory [name] is a 65 y.o. female who has a televisit for complaint of cough, sore throat, fever, runny nose and sinus pressure related headache since yesterday.  Her home COVID test was negative.  She denies any dyspnea or wheezing currently.  She has had COVID-vaccine.  The patient does have symptoms concerning for COVID-19 infection (fever, chills, cough, or new shortness of breath).   Past Medical, Surgical, Social History, Allergies, and Medications have been Reviewed.  Past Medical History:  Diagnosis Date   GERD (gastroesophageal reflux disease)    HLD (hyperlipidemia)    IBS (irritable bowel syndrome)    Osteoarthritis of knee    Right; had right TKR   Pulmonary  embolism (HCC)    5 yrs ago (unknown region)   Past Surgical History:  Procedure Laterality Date   CESAREAN SECTION  1991   mc   CESAREAN SECTION N/A    [employer] 01/16/2021   CHOLECYSTECTOMY  15 yrs ago   mc   COLONOSCOPY  05/24/2012   Procedure: COLONOSCOPY;  Surgeon: Rogene Houston, MD;  [facility];  Service: Endoscopy;  Laterality: N/A;  220   ERCP  08/12/2011   Procedure: ENDOSCOPIC RETROGRADE CHOLANGIOPANCREATOGRAPHY (ERCP);  Surgeon: Rogene Houston, MD;  Location: [facility];  Service: Endoscopy;  Laterality: N/A;   ERCP W/ SPHICTEROTOMY  08/12/11   Dr Rehman-?microlithiasis, SOD   ESOPHAGOGASTRODUODENOSCOPY  11/25/2011   Procedure: ESOPHAGOGASTRODUODENOSCOPY (EGD);  Surgeon: Rogene Houston, MD;  [facility];  Service: Endoscopy;  Laterality: N/A;  830   EUS  01/19/2012   Procedure: UPPER ENDOSCOPIC ULTRASOUND (EUS) LINEAR;  Surgeon: Milus Banister, MD;  [facility];  Service: Endoscopy;  Laterality: N/A;  pt moved up a hour early by AW , Patty @ [place] office ok'd / pt was called by [place] 01/16/2021   SPHINCTEROTOMY  08/12/2011   Procedure: Joan Mayans;  Surgeon: Rogene Houston, MD;  [facility];  Service: Endoscopy;;  with ballon passage   TOTAL KNEE ARTHROPLASTY Right 09/13/2017   TOTAL KNEE ARTHROPLASTY Right 09/13/2017   Procedure: RIGHT TOTAL KNEE ARTHROPLASTY;  Surgeon: Mcarthur Rossetti, MD;  Location: [facility];  Service: Orthopedics;  Laterality: Right;   TUBAL LIGATION  Current Meds  Medication Sig   aspirin EC 81 MG tablet Take 81 mg by mouth every other day. Swallow whole.   blood glucose meter kit and supplies Dispense based on patient and insurance preference. Use up to four times daily as directed. (FOR ICD-10 E10.9, E11.9).   Chlorphen-PE-Acetaminophen (NOREL AD) 4-10-325 MG TABS Take 1 tablet by mouth 3 (three) times daily as needed.   cholecalciferol (VITAMIN D3) 25 MCG (1000  UNIT) tablet Take 1,000 Units by mouth daily.   glucose blood (CONTOUR NEXT TEST) test strip USE TO CHECK BLOOD SUGAR FOUR TIMES DAILY AS DIRECTED   hyoscyamine (LEVSIN SL) 0.125 MG SL tablet Take 1 to 2 tablets every 6 hours as needed   ibuprofen (ADVIL,MOTRIN) 200 MG tablet Take 400 mg by mouth daily as needed for mild pain or moderate pain. Pain   Microlet Lancets MISC USE TO CHECK BLOOD SUGAR FOUR TIMES DAILY AS DIRECTED   pantoprazole (PROTONIX) 40 MG tablet Take 1 tablet (40 mg total) by mouth daily. Take one tablet shortly before breakfast meal each day   pravastatin (PRAVACHOL) 40 MG tablet Take 1 tablet (40 mg total) by mouth daily.   sertraline (ZOLOFT) 50 MG tablet TAKE ONE TABLET BY MOUTH ONCE DAILY.   UNABLE TO FIND Home Sleep Study Kit - use as directed to detect sleep apnea Dx: D75.1; R06.83     Allergies:   Promethazine hcl, Phenytoin, Hydromorphone, Macrolides and ketolides, and Nitrofurantoin monohyd macro   ROS:   Please see the history of present illness.     All other systems reviewed and are negative.   Labs/Other Tests and Data Reviewed:    Recent Labs: 01/29/2021: TSH 1.770 05/10/2021: ALT 27; BUN 13; Creatinine, Ser 0.67; Hemoglobin 15.1; Platelets 185; Potassium 4.2; Sodium 142   Recent Lipid Panel Lab Results  Component Value Date/Time   CHOL 168 05/10/2021 08:04 AM   TRIG 86 05/10/2021 08:04 AM   HDL 44 05/10/2021 08:04 AM   CHOLHDL 4.1 12/20/2019 02:33 PM   LDLCALC 108 (H) 05/10/2021 08:04 AM   LDLCALC 105 (H) 12/20/2019 02:33 PM    Wt Readings from Last 3 Encounters:  05/13/21 159 lb (72.1 kg)  03/04/21 177 lb (80.3 kg)  02/03/21 185 lb (83.9 kg)     ASSESSMENT & PLAN:    URTI Had flulike symptoms, check rapid flu Home COVID test negative Norel as needed for nasal congestion Continue Flonase for allergies  Time:   Today, I have spent 9 minutes reviewing the chart, including problem list, medications, and with the patient with telehealth  technology discussing the above problems.   Medication Adjustments/Labs and Tests Ordered: Current medicines are reviewed at length with the patient today.  Concerns regarding medicines are outlined above.   Tests Ordered: No orders of the defined types were placed in this encounter.   Medication Changes: Meds ordered this encounter  Medications   Chlorphen-PE-Acetaminophen (NOREL AD) 4-10-325 MG TABS    Sig: Take 1 tablet by mouth 3 (three) times daily as needed.    Dispense:  30 tablet    Refill:  0     Note: This dictation was prepared with Dragon dictation along with smaller phrase technology. Similar sounding words can be transcribed inadequately or may not be corrected upon review. Any transcriptional errors that result from this process are unintentional.      Disposition:  Follow up  Signed, Mallory Spar, MD  09/15/2021 11:57 AM     [facility]  [facility]

## 2021-09-20 ENCOUNTER — Ambulatory Visit: Payer: BC Managed Care – PPO | Admitting: Nurse Practitioner

## 2021-09-22 DIAGNOSIS — D751 Secondary polycythemia: Secondary | ICD-10-CM | POA: Diagnosis not present

## 2021-09-22 DIAGNOSIS — G4733 Obstructive sleep apnea (adult) (pediatric): Secondary | ICD-10-CM | POA: Diagnosis not present

## 2021-09-22 DIAGNOSIS — E1165 Type 2 diabetes mellitus with hyperglycemia: Secondary | ICD-10-CM | POA: Diagnosis not present

## 2021-09-22 DIAGNOSIS — E7800 Pure hypercholesterolemia, unspecified: Secondary | ICD-10-CM | POA: Diagnosis not present

## 2021-09-22 DIAGNOSIS — R634 Abnormal weight loss: Secondary | ICD-10-CM | POA: Diagnosis not present

## 2021-09-23 LAB — CBC WITH DIFFERENTIAL/PLATELET
Basophils Absolute: 0.1 10*3/uL (ref 0.0–0.2)
Basos: 1 %
EOS (ABSOLUTE): 0.2 10*3/uL (ref 0.0–0.4)
Eos: 2 %
Hematocrit: 42.4 % (ref 34.0–46.6)
Hemoglobin: 14.2 g/dL (ref 11.1–15.9)
Immature Grans (Abs): 0 10*3/uL (ref 0.0–0.1)
Immature Granulocytes: 0 %
Lymphocytes Absolute: 2 10*3/uL (ref 0.7–3.1)
Lymphs: 31 %
MCH: 28.1 pg (ref 26.6–33.0)
MCHC: 33.5 g/dL (ref 31.5–35.7)
MCV: 84 fL (ref 79–97)
Monocytes Absolute: 0.6 10*3/uL (ref 0.1–0.9)
Monocytes: 8 %
Neutrophils Absolute: 3.8 10*3/uL (ref 1.4–7.0)
Neutrophils: 58 %
Platelets: 231 10*3/uL (ref 150–450)
RBC: 5.05 x10E6/uL (ref 3.77–5.28)
RDW: 14.4 % (ref 11.7–15.4)
WBC: 6.5 10*3/uL (ref 3.4–10.8)

## 2021-09-23 LAB — CMP14+EGFR
ALT: 25 IU/L (ref 0–32)
AST: 18 IU/L (ref 0–40)
Albumin/Globulin Ratio: 1.7 (ref 1.2–2.2)
Albumin: 4.2 g/dL (ref 3.8–4.8)
Alkaline Phosphatase: 121 IU/L (ref 44–121)
BUN/Creatinine Ratio: 20 (ref 12–28)
BUN: 15 mg/dL (ref 8–27)
Bilirubin Total: 0.3 mg/dL (ref 0.0–1.2)
CO2: 25 mmol/L (ref 20–29)
Calcium: 9.6 mg/dL (ref 8.7–10.3)
Chloride: 102 mmol/L (ref 96–106)
Creatinine, Ser: 0.75 mg/dL (ref 0.57–1.00)
Globulin, Total: 2.5 g/dL (ref 1.5–4.5)
Glucose: 90 mg/dL (ref 70–99)
Potassium: 4.1 mmol/L (ref 3.5–5.2)
Sodium: 141 mmol/L (ref 134–144)
Total Protein: 6.7 g/dL (ref 6.0–8.5)
eGFR: 89 mL/min/{1.73_m2} (ref >59)

## 2021-09-23 LAB — LIPID PANEL WITH LDL/HDL RATIO
Cholesterol, Total: 203 mg/dL — ABNORMAL HIGH (ref 100–199)
HDL: 47 mg/dL (ref >39)
LDL Chol Calc (NIH): 141 mg/dL — ABNORMAL HIGH (ref 0–99)
LDL/HDL Ratio: 3 ratio (ref 0.0–3.2)
Triglycerides: 82 mg/dL (ref 0–149)
VLDL Cholesterol Cal: 15 mg/dL (ref 5–40)

## 2021-09-23 LAB — HEMOGLOBIN A1C
Est. average glucose Bld gHb Est-mCnc: 123 mg/dL
Hgb A1c MFr Bld: 5.9 % — ABNORMAL HIGH (ref 4.8–5.6)

## 2021-09-23 NOTE — Progress Notes (Signed)
A1c is looking great. But cholesterol is creeping up. We will discuss this at her appt on the 20th.

## 2021-09-24 DIAGNOSIS — Z01419 Encounter for gynecological examination (general) (routine) without abnormal findings: Secondary | ICD-10-CM | POA: Diagnosis not present

## 2021-09-24 DIAGNOSIS — Z124 Encounter for screening for malignant neoplasm of cervix: Secondary | ICD-10-CM | POA: Diagnosis not present

## 2021-09-24 DIAGNOSIS — Z1231 Encounter for screening mammogram for malignant neoplasm of breast: Secondary | ICD-10-CM | POA: Diagnosis not present

## 2021-09-24 DIAGNOSIS — Z6824 Body mass index (BMI) 24.0-24.9, adult: Secondary | ICD-10-CM | POA: Diagnosis not present

## 2021-09-24 LAB — MICROALBUMIN / CREATININE URINE RATIO
Creatinine, Urine: 81 mg/dL
Microalb/Creat Ratio: <4 mg/g creat (ref 0–29)
Microalbumin, Urine: <3 ug/mL

## 2021-09-24 LAB — HM PAP SMEAR: HM Pap smear: NEGATIVE

## 2021-09-24 LAB — HM MAMMOGRAPHY

## 2021-09-27 DIAGNOSIS — G4733 Obstructive sleep apnea (adult) (pediatric): Secondary | ICD-10-CM | POA: Diagnosis not present

## 2021-09-28 ENCOUNTER — Encounter: Payer: Self-pay | Admitting: Nurse Practitioner

## 2021-09-28 ENCOUNTER — Other Ambulatory Visit: Payer: Self-pay

## 2021-09-28 ENCOUNTER — Ambulatory Visit (INDEPENDENT_AMBULATORY_CARE_PROVIDER_SITE_OTHER): Payer: BC Managed Care – PPO | Admitting: Nurse Practitioner

## 2021-09-28 VITALS — BP 117/71 | HR 60 | Ht 64.0 in | Wt 139.1 lb

## 2021-09-28 DIAGNOSIS — R634 Abnormal weight loss: Secondary | ICD-10-CM | POA: Diagnosis not present

## 2021-09-28 DIAGNOSIS — D751 Secondary polycythemia: Secondary | ICD-10-CM

## 2021-09-28 DIAGNOSIS — E1165 Type 2 diabetes mellitus with hyperglycemia: Secondary | ICD-10-CM

## 2021-09-28 DIAGNOSIS — E7800 Pure hypercholesterolemia, unspecified: Secondary | ICD-10-CM | POA: Diagnosis not present

## 2021-09-28 MED ORDER — PRAVASTATIN SODIUM 80 MG PO TABS: 80.0000 mg | ORAL_TABLET | Freq: Every day | ORAL | 3 refills | Status: DC

## 2021-09-28 NOTE — Progress Notes (Signed)
Acute Office Visit  Subjective:    Patient ID: Mallory [name], female    DOB: July 20, 1956, 65 y.o.   MRN: [id number]  Chief Complaint  Patient presents with   Follow-up    4 month Follow up - DM    HPI Patient is in today for lab follow-up.  She has been doing keto diet and exercising to lose weight. She stopped exercise about 3 weeks ago when she got a cold, but she is still doing the keto diet. She has really focused on cutting out carbs.  Past Medical History:  Diagnosis Date   GERD (gastroesophageal reflux disease)    HLD (hyperlipidemia)    IBS (irritable bowel syndrome)    Osteoarthritis of knee    Right; had right TKR   Pulmonary embolism (HCC)    5 yrs ago (unknown region)    Past Surgical History:  Procedure Laterality Date   CESAREAN SECTION  1991   mc   CESAREAN SECTION N/A    Phreesia 01/16/2021   CHOLECYSTECTOMY  15 yrs ago   mc   COLONOSCOPY  05/24/2012   Procedure: COLONOSCOPY;  Surgeon: Rogene Houston, MD;  [facility];  Service: Endoscopy;  Laterality: N/A;  220   ERCP  08/12/2011   Procedure: ENDOSCOPIC RETROGRADE CHOLANGIOPANCREATOGRAPHY (ERCP);  Surgeon: Rogene Houston, MD;  [facility]: [facility];  Service: Endoscopy;  Laterality: N/A;   ERCP W/ SPHICTEROTOMY  08/12/11   Dr Rehman-?microlithiasis, SOD   ESOPHAGOGASTRODUODENOSCOPY  11/25/2011   Procedure: ESOPHAGOGASTRODUODENOSCOPY (EGD);  Surgeon: Rogene Houston, MD;  [facility];  Service: Endoscopy;  Laterality: N/A;  830   EUS  01/19/2012   Procedure: UPPER ENDOSCOPIC ULTRASOUND (EUS) LINEAR;  Surgeon: Milus Banister, MD;  [facility];  Service: Endoscopy;  Laterality: N/A;  pt moved up a hour early by AW , Patty @ [facility] office ok'd / pt was called by [facility] 01/16/2021   SPHINCTEROTOMY  08/12/2011   Procedure: Joan Mayans;  Surgeon: Rogene Houston, MD;  [facility]: [facility];  Service: Endoscopy;;  with ballon passage   TOTAL KNEE  ARTHROPLASTY Right 09/13/2017   TOTAL KNEE ARTHROPLASTY Right 09/13/2017   Procedure: RIGHT TOTAL KNEE ARTHROPLASTY;  Surgeon: Mcarthur Rossetti, MD;  Location: [facility];  Service: Orthopedics;  Laterality: Right;   TUBAL LIGATION      Family History  Problem Relation Age of Onset   Diabetes Mother    Heart failure Father    Diabetes Brother    Heart disease Sister    Heart disease Maternal Grandmother    Cancer Maternal Grandfather        ? pancreatic or lung   Anesthesia problems Neg Hx    Hypotension Neg Hx    Malignant hyperthermia Neg Hx    Pseudochol deficiency Neg Hx    Stroke Neg Hx     Social History   Socioeconomic History   Marital status: Married    Spouse name: Not on file   Number of children: 2   Years of education: Not on file   Highest education level: Not on file  Occupational History   Occupation: [employer]    Comment: Medical sales representative  Tobacco Use   Smoking status: Never   Smokeless tobacco: Never  Vaping Use   Vaping Use: Never used  Substance and Sexual Activity   Alcohol use: Yes    Alcohol/week: 0.0 standard drinks  Comment: occasional wine or moonshine, but only on vacation   Drug use: No   Sexual activity: Yes    Birth control/protection: Post-menopausal  Other Topics Concern   Not on file  Social History Narrative    2 daughters      Social Determinants of Health   Financial Resource Strain: Not on file  Food Insecurity: Not on file  Transportation Needs: Not on file  Physical Activity: Not on file  Stress: Not on file  Social Connections: Not on file  Intimate Partner Violence: Not on file    Outpatient Medications Prior to Visit  Medication Sig Dispense Refill   aspirin EC 81 MG tablet Take 81 mg by mouth every other day. Swallow whole.     blood glucose meter kit and supplies Dispense based on patient and insurance preference. Use up to four times daily as directed. (FOR ICD-10 E10.9, E11.9). 1 each 0    Chlorphen-PE-Acetaminophen (NOREL AD) 4-10-325 MG TABS Take 1 tablet by mouth 3 (three) times daily as needed. 30 tablet 0   cholecalciferol (VITAMIN D3) 25 MCG (1000 UNIT) tablet Take 1,000 Units by mouth daily.     glucose blood (CONTOUR NEXT TEST) test strip USE TO CHECK BLOOD SUGAR FOUR TIMES DAILY AS DIRECTED 100 strip 0   hyoscyamine (LEVSIN SL) 0.125 MG SL tablet Take 1 to 2 tablets every 6 hours as needed 50 tablet 3   ibuprofen (ADVIL,MOTRIN) 200 MG tablet Take 400 mg by mouth daily as needed for mild pain or moderate pain. Pain     Microlet Lancets MISC USE TO CHECK BLOOD SUGAR FOUR TIMES DAILY AS DIRECTED 100 each 0   pantoprazole (PROTONIX) 40 MG tablet Take 1 tablet (40 mg total) by mouth daily. Take one tablet shortly before breakfast meal each day 30 tablet 11   sertraline (ZOLOFT) 50 MG tablet TAKE ONE TABLET BY MOUTH ONCE DAILY. 30 tablet 0   UNABLE TO FIND Home Sleep Study Kit - use as directed to detect sleep apnea Dx: D75.1; R06.83 1 each 0   pravastatin (PRAVACHOL) 40 MG tablet Take 1 tablet (40 mg total) by mouth daily. 90 tablet 3   No facility-administered medications prior to visit.    Allergies  Allergen Reactions   Promethazine Hcl Hives   Phenytoin Itching   Hydromorphone Itching   Macrolides And Ketolides Itching   Nitrofurantoin Monohyd Macro Itching    Review of Systems  Constitutional: Negative.        She has been using low carb diet since being diagnosed with DM, and has been losing weight -she was exercising a lot (3 flights of stairs q2h at work, 1 mile on treadmill per day, and walking 1.5 miles at home), but stopped this 3 weeks ago and has maintained her weight  Respiratory: Negative.    Cardiovascular: Negative.   Musculoskeletal: Negative.       Objective:    Physical Exam Constitutional:      Appearance: Normal appearance.  Cardiovascular:     Rate and Rhythm: Normal rate and regular rhythm.     Pulses: Normal pulses.     Heart  sounds: Normal heart sounds.  Pulmonary:     Effort: Pulmonary effort is normal.     Breath sounds: Normal breath sounds.  Musculoskeletal:        General: Normal range of motion.  Neurological:     Mental Status: She is alert.  Psychiatric:        Mood and  Affect: Mood normal.        Behavior: Behavior normal.        Thought Content: Thought content normal.        Judgment: Judgment normal.    BP 117/71 (BP Location: Right Arm, Patient Position: Sitting, Cuff Size: Normal)    Pulse 60    Ht 5' 4"  (1.626 m)    Wt 139 lb 1.9 oz (63.1 kg)    LMP 05/11/2011    SpO2 98%    BMI 23.88 kg/m  Wt Readings from Last 3 Encounters:  09/28/21 139 lb 1.9 oz (63.1 kg)  05/13/21 159 lb (72.1 kg)  03/04/21 177 lb (80.3 kg)    Health Maintenance Due  Topic Date Due   Pneumococcal Vaccine 78-33 Years old (1 - PCV) Never done   FOOT EXAM  Never done   OPHTHALMOLOGY EXAM  Never done   Zoster Vaccines- Shingrix (1 of 2) Never done   PAP SMEAR-Modifier  05/26/2019   COVID-19 Vaccine (3 - Booster for Moderna series) 03/06/2020    There are no preventive care reminders to display for this patient.   Lab Results  Component Value Date   TSH 1.770 01/29/2021   Lab Results  Component Value Date   WBC 6.5 09/22/2021   HGB 14.2 09/22/2021   HCT 42.4 09/22/2021   MCV 84 09/22/2021   PLT 231 09/22/2021   Lab Results  Component Value Date   NA 141 09/22/2021   K 4.1 09/22/2021   CO2 25 09/22/2021   GLUCOSE 90 09/22/2021   BUN 15 09/22/2021   CREATININE 0.75 09/22/2021   BILITOT 0.3 09/22/2021   ALKPHOS 121 09/22/2021   AST 18 09/22/2021   ALT 25 09/22/2021   PROT 6.7 09/22/2021   ALBUMIN 4.2 09/22/2021   CALCIUM 9.6 09/22/2021   ANIONGAP 6 11/27/2020   EGFR 89 09/22/2021   GFR 75.19 01/25/2019   Lab Results  Component Value Date   CHOL 203 (H) 09/22/2021   Lab Results  Component Value Date   HDL 47 09/22/2021   Lab Results  Component Value Date   LDLCALC 141 (H) 09/22/2021    Lab Results  Component Value Date   TRIG 82 09/22/2021   Lab Results  Component Value Date   CHOLHDL 4.1 12/20/2019   Lab Results  Component Value Date   HGBA1C 5.9 (H) 09/22/2021       Assessment & Plan:   Problem List Items Addressed This Visit       Endocrine   Type 2 diabetes mellitus with hyperglycemia ([place]) - Primary    Lab Results  Component Value Date   HGBA1C 5.9 (H) 09/22/2021  -diet controlled; no meds at this time      Relevant Medications   pravastatin (PRAVACHOL) 80 MG tablet   Other Relevant Orders   CBC with Differential/Platelet   CMP14+EGFR   Lipid Panel With LDL/HDL Ratio   Hemoglobin A1c     Other   HLD (hyperlipidemia)    Lab Results  Component Value Date   CHOL 203 (H) 09/22/2021   HDL 47 09/22/2021   LDLCALC 141 (H) 09/22/2021   TRIG 82 09/22/2021   CHOLHDL 4.1 12/20/2019  -with DM, goal LDL < 70 -INCREASE pravastatin; may swap to rosuvastatin in future if not at goal      Relevant Medications   pravastatin (PRAVACHOL) 80 MG tablet   Other Relevant Orders   Lipid Panel With LDL/HDL Ratio   Polycythemia    -  she donates blood, and RBCs look great today      Relevant Orders   CBC with Differential/Platelet   Weight loss    Wt Readings from Last 10 Encounters:  09/28/21 139 lb 1.9 oz (63.1 kg)  05/13/21 159 lb (72.1 kg)  03/04/21 177 lb (80.3 kg)  02/03/21 185 lb (83.9 kg)  01/19/21 184 lb (83.5 kg)  01/19/21 184 lb (83.5 kg)  11/27/20 180 lb (81.6 kg)  09/29/20 184 lb (83.5 kg)  12/20/19 184 lb (83.5 kg)  08/14/19 177 lb 12.8 oz (80.6 kg)  -down 46 pounds in 8 months -add on TSH to labs        Meds ordered this encounter  Medications   pravastatin (PRAVACHOL) 80 MG tablet    Sig: Take 1 tablet (80 mg total) by mouth daily.    Dispense:  90 tablet    Refill:  3    Increased dose today (05/13/21)     Noreene Larsson, NP

## 2021-09-28 NOTE — Patient Instructions (Signed)
Please have fasting labs drawn 2-3 days prior to your appointment so we can discuss the results during your office visit.  I will be moving to [facility] located at [address], [place], [place] [address] effective Oct 10, 2021. If you would like to establish care with [facility] please call [phone].

## 2021-09-28 NOTE — Assessment & Plan Note (Signed)
Lab Results  Component Value Date   HGBA1C 5.9 (H) 09/22/2021   -diet controlled; no meds at this time

## 2021-09-28 NOTE — Assessment & Plan Note (Signed)
Lab Results  Component Value Date   CHOL 203 (H) 09/22/2021   HDL 47 09/22/2021   LDLCALC 141 (H) 09/22/2021   TRIG 82 09/22/2021   CHOLHDL 4.1 12/20/2019   -with DM, goal LDL < 70 -INCREASE pravastatin; may swap to rosuvastatin in future if not at goal

## 2021-09-28 NOTE — Assessment & Plan Note (Signed)
Wt Readings from Last 10 Encounters:  09/28/21 139 lb 1.9 oz (63.1 kg)  05/13/21 159 lb (72.1 kg)  03/04/21 177 lb (80.3 kg)  02/03/21 185 lb (83.9 kg)  01/19/21 184 lb (83.5 kg)  01/19/21 184 lb (83.5 kg)  11/27/20 180 lb (81.6 kg)  09/29/20 184 lb (83.5 kg)  12/20/19 184 lb (83.5 kg)  08/14/19 177 lb 12.8 oz (80.6 kg)   -down 46 pounds in 8 months -add on TSH to labs

## 2021-09-28 NOTE — Assessment & Plan Note (Signed)
-  she donates blood, and RBCs look great today

## 2021-10-06 LAB — TSH: TSH: 3.62 u[IU]/mL (ref 0.450–4.500)

## 2021-10-06 LAB — SPECIMEN STATUS REPORT

## 2021-10-12 ENCOUNTER — Other Ambulatory Visit: Payer: Self-pay | Admitting: Nurse Practitioner

## 2021-10-12 DIAGNOSIS — F419 Anxiety disorder, unspecified: Secondary | ICD-10-CM

## 2021-10-28 DIAGNOSIS — G4733 Obstructive sleep apnea (adult) (pediatric): Secondary | ICD-10-CM | POA: Diagnosis not present

## 2021-11-08 ENCOUNTER — Other Ambulatory Visit: Payer: Self-pay | Admitting: Internal Medicine

## 2021-11-08 DIAGNOSIS — F419 Anxiety disorder, unspecified: Secondary | ICD-10-CM

## 2021-11-15 ENCOUNTER — Telehealth: Payer: Self-pay | Admitting: Internal Medicine

## 2021-11-15 NOTE — Telephone Encounter (Signed)
Pt is calling states she has had cramps  and pain w\headache for a week, with the increased does of Pravastatin

## 2021-11-16 NOTE — Telephone Encounter (Signed)
Pt advised with verbal understanding  °

## 2021-11-28 DIAGNOSIS — G4733 Obstructive sleep apnea (adult) (pediatric): Secondary | ICD-10-CM | POA: Diagnosis not present

## 2021-11-30 NOTE — Telephone Encounter (Signed)
LVM for pt.

## 2021-11-30 NOTE — Telephone Encounter (Signed)
Pt  stopped by she said that she went with every other day with the pravastatin and now she is swimmy headed.and keeps a constant headache  Please call the pt to advise

## 2021-12-23 DIAGNOSIS — E1165 Type 2 diabetes mellitus with hyperglycemia: Secondary | ICD-10-CM | POA: Diagnosis not present

## 2021-12-23 DIAGNOSIS — D751 Secondary polycythemia: Secondary | ICD-10-CM | POA: Diagnosis not present

## 2021-12-23 DIAGNOSIS — E7800 Pure hypercholesterolemia, unspecified: Secondary | ICD-10-CM | POA: Diagnosis not present

## 2021-12-24 LAB — CMP14+EGFR
ALT: 17 IU/L (ref 0–32)
AST: 20 IU/L (ref 0–40)
Albumin/Globulin Ratio: 1.8 (ref 1.2–2.2)
Albumin: 4.2 g/dL (ref 3.8–4.8)
Alkaline Phosphatase: 103 IU/L (ref 44–121)
BUN/Creatinine Ratio: 23 (ref 12–28)
BUN: 18 mg/dL (ref 8–27)
Bilirubin Total: 0.4 mg/dL (ref 0.0–1.2)
CO2: 26 mmol/L (ref 20–29)
Calcium: 9.5 mg/dL (ref 8.7–10.3)
Chloride: 102 mmol/L (ref 96–106)
Creatinine, Ser: 0.78 mg/dL (ref 0.57–1.00)
Globulin, Total: 2.4 g/dL (ref 1.5–4.5)
Glucose: 89 mg/dL (ref 70–99)
Potassium: 4.2 mmol/L (ref 3.5–5.2)
Sodium: 143 mmol/L (ref 134–144)
Total Protein: 6.6 g/dL (ref 6.0–8.5)
eGFR: 85 mL/min/{1.73_m2} (ref >59)

## 2021-12-24 LAB — CBC WITH DIFFERENTIAL/PLATELET
Basophils Absolute: 0.1 10*3/uL (ref 0.0–0.2)
Basos: 1 %
EOS (ABSOLUTE): 0.2 10*3/uL (ref 0.0–0.4)
Eos: 3 %
Hematocrit: 44.8 % (ref 34.0–46.6)
Hemoglobin: 14.9 g/dL (ref 11.1–15.9)
Immature Grans (Abs): 0 10*3/uL (ref 0.0–0.1)
Immature Granulocytes: 0 %
Lymphocytes Absolute: 1.7 10*3/uL (ref 0.7–3.1)
Lymphs: 33 %
MCH: 26.9 pg (ref 26.6–33.0)
MCHC: 33.3 g/dL (ref 31.5–35.7)
MCV: 81 fL (ref 79–97)
Monocytes Absolute: 0.4 10*3/uL (ref 0.1–0.9)
Monocytes: 8 %
Neutrophils Absolute: 2.9 10*3/uL (ref 1.4–7.0)
Neutrophils: 55 %
Platelets: 213 10*3/uL (ref 150–450)
RBC: 5.54 x10E6/uL — ABNORMAL HIGH (ref 3.77–5.28)
RDW: 13.4 % (ref 11.7–15.4)
WBC: 5.3 10*3/uL (ref 3.4–10.8)

## 2021-12-24 LAB — LIPID PANEL WITH LDL/HDL RATIO
Cholesterol, Total: 239 mg/dL — ABNORMAL HIGH (ref 100–199)
HDL: 56 mg/dL (ref >39)
LDL Chol Calc (NIH): 167 mg/dL — ABNORMAL HIGH (ref 0–99)
LDL/HDL Ratio: 3 ratio (ref 0.0–3.2)
Triglycerides: 93 mg/dL (ref 0–149)
VLDL Cholesterol Cal: 16 mg/dL (ref 5–40)

## 2021-12-24 LAB — HEMOGLOBIN A1C
Est. average glucose Bld gHb Est-mCnc: 123 mg/dL
Hgb A1c MFr Bld: 5.9 % — ABNORMAL HIGH (ref 4.8–5.6)

## 2021-12-26 DIAGNOSIS — G4733 Obstructive sleep apnea (adult) (pediatric): Secondary | ICD-10-CM | POA: Diagnosis not present

## 2021-12-27 ENCOUNTER — Ambulatory Visit (INDEPENDENT_AMBULATORY_CARE_PROVIDER_SITE_OTHER): Payer: BC Managed Care – PPO | Admitting: Internal Medicine

## 2021-12-27 ENCOUNTER — Other Ambulatory Visit: Payer: Self-pay

## 2021-12-27 ENCOUNTER — Encounter: Payer: Self-pay | Admitting: Internal Medicine

## 2021-12-27 VITALS — BP 116/78 | HR 55 | Ht 64.0 in | Wt 147.8 lb

## 2021-12-27 DIAGNOSIS — Z23 Encounter for immunization: Secondary | ICD-10-CM

## 2021-12-27 DIAGNOSIS — K219 Gastro-esophageal reflux disease without esophagitis: Secondary | ICD-10-CM

## 2021-12-27 DIAGNOSIS — E119 Type 2 diabetes mellitus without complications: Secondary | ICD-10-CM | POA: Diagnosis not present

## 2021-12-27 DIAGNOSIS — D751 Secondary polycythemia: Secondary | ICD-10-CM | POA: Diagnosis not present

## 2021-12-27 DIAGNOSIS — E782 Mixed hyperlipidemia: Secondary | ICD-10-CM | POA: Diagnosis not present

## 2021-12-27 DIAGNOSIS — E559 Vitamin D deficiency, unspecified: Secondary | ICD-10-CM

## 2021-12-27 DIAGNOSIS — F419 Anxiety disorder, unspecified: Secondary | ICD-10-CM

## 2021-12-27 MED ORDER — ROSUVASTATIN CALCIUM 10 MG PO TABS: 10.0000 mg | ORAL_TABLET | Freq: Every day | ORAL | 0 refills | Status: DC

## 2021-12-27 NOTE — Patient Instructions (Signed)
Please start taking Crestor instead of Pravastatin. ? ?Please continue to take other medications as prescribed. ? ?You are being referred to Hematology for high RBC count/hemoglobin. ?

## 2021-12-30 ENCOUNTER — Encounter: Payer: Self-pay | Admitting: *Deleted

## 2021-12-31 ENCOUNTER — Encounter: Payer: Self-pay | Admitting: *Deleted

## 2021-12-31 NOTE — Progress Notes (Signed)
? ?Established Patient Office Visit ? ?Subjective:  ?Patient ID: Mallory [name], female    DOB: 1956-07-18  Age: 66 y.o. MRN: [id number] ? ?CC:  ?Chief Complaint  ?Patient presents with  ? Follow-up  ?  Pt would like to discuss cholesterol medication, has concerns about last lab work.    ? ? ?HPI ?Mallory [name] is a 66 y.o. female with past medical history of GERD, IBS, type II DM with HLD and GAD who presents for f/u of her chronic medical conditions. ? ?Type II DM with HLD: Diet controlled DM.  She has stopped taking pravastatin 80 mg as she was having severe myalgias with it.  Her lipid profile shows worsening of LDL.  She agrees to take Crestor instead for now. ? ?She asks about her elevated Hb in the past.  She has had venipuncture for lowering her Hb in the past.  She has not had any evaluation for polycythemia in the past.  Of note, she also reports history of unprovoked PE in the past.  She currently denies any heat insensitivity, easy bruising, dyspnea, chest pain or palpitations.  Denies any active signs of bleeding currently. ? ?She received first dose of Shingrix vaccine in the office today. ?Past Medical History:  ?Diagnosis Date  ? GERD (gastroesophageal reflux disease)   ? HLD (hyperlipidemia)   ? IBS (irritable bowel syndrome)   ? Osteoarthritis of knee   ? Right; had right TKR  ? Pulmonary embolism ([place])   ? 5 yrs ago (unknown region)  ? ? ?Past Surgical History:  ?Procedure Laterality Date  ? [place]  ? mc  ? CESAREAN SECTION N/A   ? Phreesia 01/16/2021  ? CHOLECYSTECTOMY  15 yrs ago  ? mc  ? COLONOSCOPY  05/24/2012  ? Procedure: COLONOSCOPY;  Surgeon: Rogene Houston, MD;  [facility];  Service: Endoscopy;  Laterality: N/A;  220  ? ERCP  08/12/2011  ? Procedure: ENDOSCOPIC RETROGRADE CHOLANGIOPANCREATOGRAPHY (ERCP);  Surgeon: Rogene Houston, MD;  [facility];  Service: Endoscopy;  Laterality: N/A;  ? ERCP W/ SPHICTEROTOMY  08/12/11  ? Dr Rehman-?microlithiasis, SOD  ?  ESOPHAGOGASTRODUODENOSCOPY  11/25/2011  ? Procedure: ESOPHAGOGASTRODUODENOSCOPY (EGD);  Surgeon: Rogene Houston, MD;  [facility];  Service: Endoscopy;  Laterality: N/A;  830  ? EUS  01/19/2012  ? Procedure: UPPER ENDOSCOPIC ULTRASOUND (EUS) LINEAR;  Surgeon: Milus Banister, MD;  [facility];  Service: Endoscopy;  Laterality: N/A;  pt moved up a hour early by AW , Patty @ [facility] office ok'd / pt was called by AW  ? JOINT REPLACEMENT N/A   ? Phreesia 01/16/2021  ? SPHINCTEROTOMY  08/12/2011  ? Procedure: SPHINCTEROTOMY;  Surgeon: Rogene Houston, MD;  [facility];  Service: Endoscopy;;  with ballon passage  ? TOTAL KNEE ARTHROPLASTY Right 09/13/2017  ? TOTAL KNEE ARTHROPLASTY Right 09/13/2017  ? Procedure: RIGHT TOTAL KNEE ARTHROPLASTY;  Surgeon: Mcarthur Rossetti, MD;  Location: [facility];  Service: Orthopedics;  Laterality: Right;  ? TUBAL LIGATION    ? ? ?Family History  ?Problem Relation Age of Onset  ? Diabetes Mother   ? Heart failure Father   ? Diabetes Brother   ? Heart disease Sister   ? Heart disease Maternal Grandmother   ? Cancer Maternal Grandfather   ?     ? pancreatic or lung  ? Anesthesia problems Neg Hx   ? Hypotension Neg Hx   ?  Malignant hyperthermia Neg Hx   ? Pseudochol deficiency Neg Hx   ? Stroke Neg Hx   ? ? ?Social History  ? ?Socioeconomic History  ? Marital status: Married  ?  Spouse name: Not on file  ? Number of children: 2  ? Years of education: Not on file  ? Highest education level: Not on file  ?Occupational History  ? Occupation: [employer]  ?  Comment: Medical sales representative  ?Tobacco Use  ? Smoking status: Never  ? Smokeless tobacco: Never  ?Vaping Use  ? Vaping Use: Never used  ?Substance and Sexual Activity  ? Alcohol use: Yes  ?  Alcohol/week: 0.0 standard drinks  ?  Comment: occasional wine or moonshine, but only on vacation  ? Drug use: No  ? Sexual activity: Yes  ?  Birth control/protection: Post-menopausal  ?Other Topics Concern  ? Not on file  ?Social  History Narrative  ?  2 daughters  ?   ? ?Social Determinants of Health  ? ?Financial Resource Strain: Not on file  ?Food Insecurity: Not on file  ?Transportation Needs: Not on file  ?Physical Activity: Not on file  ?Stress: Not on file  ?Social Connections: Not on file  ?Intimate Partner Violence: Not on file  ? ? ?Outpatient Medications Prior to Visit  ?Medication Sig Dispense Refill  ? aspirin EC 81 MG tablet Take 81 mg by mouth every other day. Swallow whole.    ? blood glucose meter kit and supplies Dispense based on patient and insurance preference. Use up to four times daily as directed. (FOR ICD-10 E10.9, E11.9). 1 each 0  ? cholecalciferol (VITAMIN D3) 25 MCG (1000 UNIT) tablet Take 1,000 Units by mouth daily.    ? glucose blood (CONTOUR NEXT TEST) test strip USE TO CHECK BLOOD SUGAR FOUR TIMES DAILY AS DIRECTED 100 strip 0  ? hyoscyamine (LEVSIN SL) 0.125 MG SL tablet Take 1 to 2 tablets every 6 hours as needed 50 tablet 3  ? ibuprofen (ADVIL,MOTRIN) 200 MG tablet Take 400 mg by mouth daily as needed for mild pain or moderate pain. Pain    ? Microlet Lancets MISC USE TO CHECK BLOOD SUGAR FOUR TIMES DAILY AS DIRECTED 100 each 0  ? pantoprazole (PROTONIX) 40 MG tablet Take 1 tablet (40 mg total) by mouth daily. Take one tablet shortly before breakfast meal each day 30 tablet 11  ? sertraline (ZOLOFT) 50 MG tablet TAKE ONE TABLET BY MOUTH ONCE DAILY. 30 tablet 3  ? UNABLE TO FIND Home Sleep Study Kit - use as directed to detect sleep apnea ?Dx: D75.1; R06.83 1 each 0  ? pravastatin (PRAVACHOL) 80 MG tablet Take 1 tablet (80 mg total) by mouth daily. 90 tablet 3  ? Chlorphen-PE-Acetaminophen (NOREL AD) 4-10-325 MG TABS Take 1 tablet by mouth 3 (three) times daily as needed. 30 tablet 0  ? ?No facility-administered medications prior to visit.  ? ? ?Allergies  ?Allergen Reactions  ? Promethazine Hcl Hives  ? Phenytoin Itching  ? Hydromorphone Itching  ? Macrolides And Ketolides Itching  ? Nitrofurantoin Monohyd  Macro Itching  ? ? ?ROS ?Review of Systems  ?Constitutional:  Negative for chills and fever.  ?HENT:  Negative for congestion, sinus pressure, sinus pain and sore throat.   ?Eyes:  Negative for pain and discharge.  ?Respiratory:  Negative for cough and shortness of breath.   ?Cardiovascular:  Negative for chest pain and palpitations.  ?Gastrointestinal:  Negative for abdominal pain, diarrhea, nausea and vomiting.  ?Endocrine:  Negative for polydipsia and polyuria.  ?Genitourinary:  Negative for dysuria and hematuria.  ?Musculoskeletal:  Negative for neck pain and neck stiffness.  ?Skin:  Negative for rash.  ?Neurological:  Negative for dizziness and weakness.  ?Psychiatric/Behavioral:  Negative for agitation and behavioral problems.   ? ?  ?Objective:  ?  ?Physical Exam ?Vitals reviewed.  ?Constitutional:   ?   General: She is not in acute distress. ?   Appearance: She is not diaphoretic.  ?HENT:  ?   Head: Normocephalic and atraumatic.  ?   Nose: Nose normal. No congestion.  ?   Mouth/Throat:  ?   Mouth: Mucous membranes are moist.  ?   Pharynx: No posterior oropharyngeal erythema.  ?Eyes:  ?   General: No scleral icterus. ?   Extraocular Movements: Extraocular movements intact.  ?Cardiovascular:  ?   Rate and Rhythm: Normal rate and regular rhythm.  ?   Pulses: Normal pulses.  ?   Heart sounds: Normal heart sounds. No murmur heard. ?Pulmonary:  ?   Breath sounds: Normal breath sounds. No wheezing or rales.  ?Musculoskeletal:  ?   Cervical back: Neck supple. No tenderness.  ?   Right lower leg: No edema.  ?   Left lower leg: No edema.  ?Skin: ?   General: Skin is warm.  ?   Findings: No rash.  ?Neurological:  ?   General: No focal deficit present.  ?   Mental Status: She is alert and oriented to person, place, and time.  ?Psychiatric:     ?   Mood and Affect: Mood normal.     ?   Behavior: Behavior normal.  ? ? ?BP 116/78   Pulse (!) 55   Ht 5' 4"  (1.626 m)   Wt 147 lb 12.8 oz (67 kg)   LMP 05/11/2011   SpO2  96%   BMI 25.37 kg/m?  ?Wt Readings from Last 3 Encounters:  ?12/27/21 147 lb 12.8 oz (67 kg)  ?09/28/21 139 lb 1.9 oz (63.1 kg)  ?05/13/21 159 lb (72.1 kg)  ? ? ?Lab Results  ?Component Value Date  ? TSH 3.620 12/1

## 2021-12-31 NOTE — Assessment & Plan Note (Addendum)
Hb has been around 15, but has been doing blood donations as advised in the past by previous providers ?Hb has been up to 17 in the past ?Referred to Heme/Onc. for further evaluation as she has had unprovoked VTE in the past as well - may need JAK2 mutation eval, does not smoke ?

## 2021-12-31 NOTE — Assessment & Plan Note (Signed)
Well controlled with Protonix ?

## 2021-12-31 NOTE — Assessment & Plan Note (Addendum)
Lipid profile reviewed ?Switched to Crestor for now as she did not tolerate higher dose of pravastatin ?

## 2021-12-31 NOTE — Assessment & Plan Note (Addendum)
Lab Results  ?Component Value Date  ? HGBA1C 5.9 (H) 12/23/2021  ? ? ?Diet controlled ?Advised to follow diabetic diet ?On statin ?F/u CMP and lipid panel ?Diabetic eye exam: Advised to follow up with Ophthalmology for diabetic eye exam ? ?

## 2021-12-31 NOTE — Assessment & Plan Note (Signed)
Well-controlled with Zoloft °

## 2022-01-10 ENCOUNTER — Other Ambulatory Visit: Payer: Self-pay | Admitting: Gastroenterology

## 2022-01-10 DIAGNOSIS — K219 Gastro-esophageal reflux disease without esophagitis: Secondary | ICD-10-CM

## 2022-01-18 ENCOUNTER — Encounter: Payer: Self-pay | Admitting: Family Medicine

## 2022-01-18 ENCOUNTER — Ambulatory Visit (INDEPENDENT_AMBULATORY_CARE_PROVIDER_SITE_OTHER): Payer: BC Managed Care – PPO | Admitting: Family Medicine

## 2022-01-18 DIAGNOSIS — J329 Chronic sinusitis, unspecified: Secondary | ICD-10-CM

## 2022-01-18 MED ORDER — BENZONATATE 100 MG PO CAPS: 100.0000 mg | ORAL_CAPSULE | Freq: Two times a day (BID) | ORAL | 0 refills | Status: DC | PRN

## 2022-01-18 MED ORDER — AMOXICILLIN-POT CLAVULANATE 875-125 MG PO TABS: 1.0000 | ORAL_TABLET | Freq: Two times a day (BID) | ORAL | 0 refills | Status: AC

## 2022-01-18 NOTE — Progress Notes (Signed)
?  ? ?Virtual Visit via Telephone Note  ? ?This visit type was conducted due to national recommendations for restrictions regarding the COVID-19 Pandemic (e.g. social distancing) in an effort to limit this patient's exposure and mitigate transmission in our community.  Due to her co-morbid illnesses, this patient is at least at moderate risk for complications without adequate follow up.  This format is felt to be most appropriate for this patient at this time.  The patient did not have access to video technology/had technical difficulties with video requiring transitioning to audio format only (telephone).  All issues noted in this document were discussed and addressed.  No physical exam could be performed with this format.  Please refer to the patient's chart for her  consent to telehealth for [facility].  ? ?Evaluation Performed:  Acute Visit  ? ?Date:  01/18/2022  ? ?ID:  Mallory [name], DOB [birth year], MRN [id number] ? ?Patient Location: Home ?Provider Location: Office/Clinic ? ?Participants: Patient ?Location of Patient: Home ?Location of Provider: Telehealth ?Consent was obtain for visit to be over via telehealth. ?I verified that I am speaking with the correct person using two identifiers. ? ?PCP:  Lindell Spar, MD  ? ?Chief Complaint:  congestion for 6 days ? ?History of Present Illness:   ? ?Mallory [name] is a 66 y.o. female with complaints of sore throat, congestion, rhinorrhea, and cough with yellow-green sputum for 6 days. The patient states she has been taking Norel ad,  Advil, and using her nasal saline with minimum relief. The patient denies fever, body aches, chills, and recent sick contacts. The patient reports drinking hot tea and honey to remedy the sore throat.  ? ? ?The patient does not have symptoms concerning for COVID-19 infection (fever, chills, cough, or new shortness of breath).  ? ?Past Medical, Surgical, Social History, Allergies, and Medications have been Reviewed. ? ?Past Medical  History:  ?Diagnosis Date  ? GERD (gastroesophageal reflux disease)   ? HLD (hyperlipidemia)   ? IBS (irritable bowel syndrome)   ? Osteoarthritis of knee   ? Right; had right TKR  ? Pulmonary embolism ([place])   ? 5 yrs ago (unknown region)  ? ?Past Surgical History:  ?Procedure Laterality Date  ? [place]  ? mc  ? CESAREAN SECTION N/A   ? [employer]rees[employer] 01/16/2021  ? CHOLECYSTECTOMY  15 yrs ago  ? mc  ? COLONOSCOPY  05/24/2012  ? Procedure: COLONOSCOPY;  Surgeon: Rogene Houston, MD;  [facility];  Service: Endoscopy;  Laterality: N/A;  220  ? ERCP  08/12/2011  ? Procedure: ENDOSCOPIC RETROGRADE CHOLANGIOPANCREATOGRAPHY (ERCP);  Surgeon: Rogene Houston, MD;  [facility];  Service: Endoscopy;  Laterality: N/A;  ? ERCP W/ SPHICTEROTOMY  08/12/11  ? Dr Rehman-?microlithiasis, SOD  ? ESOPHAGOGASTRODUODENOSCOPY  11/25/2011  ? Procedure: ESOPHAGOGASTRODUODENOSCOPY (EGD);  Surgeon: Rogene Houston, MD;  [facility];  Service: Endoscopy;  Laterality: N/A;  830  ? EUS  01/19/2012  ? Procedure: UPPER ENDOSCOPIC ULTRASOUND (EUS) LINEAR;  Surgeon: Milus Banister, MD;  [facility];  Service: Endoscopy;  Laterality: N/A;  pt moved up a hour early by AW , Patty @ [place] office ok'd / pt was called by AW  ? JOINT REPLACEMENT N/A   ? [employer]reesia 01/16/2021  ? SPHINCTEROTOMY  08/12/2011  ? Procedure: SPHINCTEROTOMY;  Surgeon: Rogene Houston, MD;  [facility];  Service: Endoscopy;;  with ballon passage  ? TOTAL KNEE  ARTHROPLASTY Right 09/13/2017  ? TOTAL KNEE ARTHROPLASTY Right 09/13/2017  ? Procedure: RIGHT TOTAL KNEE ARTHROPLASTY;  Surgeon: Mcarthur Rossetti, MD;  Location: [place];  Service: Orthopedics;  Laterality: Right;  ? TUBAL LIGATION    ?  ? ?Current Meds  ?Medication Sig  ? amoxicillin-clavulanate (AUGMENTIN) 875-125 MG tablet Take 1 tablet by mouth 2 (two) times daily for 7 days.  ? aspirin EC 81 MG tablet Take 81 mg by mouth every other day. Swallow whole.  ? benzonatate  (TESSALON) 100 MG capsule Take 1 capsule (100 mg total) by mouth 2 (two) times daily as needed for cough.  ? blood glucose meter kit and supplies Dispense based on patient and insurance preference. Use up to four times daily as directed. (FOR ICD-10 E10.9, E11.9).  ? cholecalciferol (VITAMIN D3) 25 MCG (1000 UNIT) tablet Take 1,000 Units by mouth daily.  ? glucose blood (CONTOUR NEXT TEST) test strip USE TO CHECK BLOOD SUGAR FOUR TIMES DAILY AS DIRECTED  ? hyoscyamine (LEVSIN SL) 0.125 MG SL tablet Take 1 to 2 tablets every 6 hours as needed  ? ibuprofen (ADVIL,MOTRIN) 200 MG tablet Take 400 mg by mouth daily as needed for mild pain or moderate pain. Pain  ? Microlet Lancets MISC USE TO CHECK BLOOD SUGAR FOUR TIMES DAILY AS DIRECTED  ? pantoprazole (PROTONIX) 40 MG tablet TAKE (1) TABLET BY MOUTH DAILY SHORTLY BEFORE BREAKFAST MEAL EACH DAY  ? rosuvastatin (CRESTOR) 10 MG tablet Take 1 tablet (10 mg total) by mouth daily.  ? sertraline (ZOLOFT) 50 MG tablet TAKE ONE TABLET BY MOUTH ONCE DAILY.  ? UNABLE TO FIND Home Sleep Study Kit - use as directed to detect sleep apnea ?Dx: D75.1; R06.83  ?  ? ?Allergies:   Promethazine hcl, Phenytoin, Hydromorphone, Macrolides and ketolides, and Nitrofurantoin monohyd macro  ? ?ROS:   ?Please see the history of present illness.    ? ?All other systems reviewed and are negative. ? ? ?Labs/Other Tests and Data Reviewed:   ? ?Recent Labs: ?09/22/2021: TSH 3.620 ?12/23/2021: ALT 17; BUN 18; Creatinine, Ser 0.78; Hemoglobin 14.9; Platelets 213; Potassium 4.2; Sodium 143  ? ?Recent Lipid Panel ?Lab Results  ?Component Value Date/Time  ? CHOL 239 (H) 12/23/2021 08:08 AM  ? TRIG 93 12/23/2021 08:08 AM  ? HDL 56 12/23/2021 08:08 AM  ? CHOLHDL 4.1 12/20/2019 02:33 PM  ? LDLCALC 167 (H) 12/23/2021 08:08 AM  ? LDLCALC 105 (H) 12/20/2019 02:33 PM  ? ? ?Wt Readings from Last 3 Encounters:  ?12/27/21 147 lb 12.8 oz (67 kg)  ?09/28/21 139 lb 1.9 oz (63.1 kg)  ?05/13/21 159 lb (72.1 kg)  ?   ? ?Objective:   ? ?Vital Signs:  LMP 05/11/2011   ? ? ? ?ASSESSMENT & PLAN:   ? Sinusitis ?-Take medications as prescribed and finish the course of the antibiotics ?-Increase fluids intake ?- Can take OTC Mucinex to help with congestion as needed.  ? ?Time:   ?Today, I have spent 20 minutes reviewing the chart, including problem list, medications, and with the patient with telehealth technology discussing the above problems. ? ? ?Medication Adjustments/Labs and Tests Ordered: ?Current medicines are reviewed at length with the patient today.  Concerns regarding medicines are outlined above.  ? ?Tests Ordered: ?No orders of the defined types were placed in this encounter. ? ? ?Medication Changes: ?Meds ordered this encounter  ?Medications  ? benzonatate (TESSALON) 100 MG capsule  ?  Sig: Take 1 capsule (100 mg total)  by mouth 2 (two) times daily as needed for cough.  ?  Dispense:  20 capsule  ?  Refill:  0  ? amoxicillin-clavulanate (AUGMENTIN) 875-125 MG tablet  ?  Sig: Take 1 tablet by mouth 2 (two) times daily for 7 days.  ?  Dispense:  20 tablet  ?  Refill:  0  ? ? ? ?  ?  ? ? ?Disposition:  Follow up  ?Signed, ?Alvira Monday, FNP  ?01/18/2022 1:55 PM    ? ?[facility] ?[facility] ?

## 2022-01-21 ENCOUNTER — Encounter (HOSPITAL_COMMUNITY): Payer: BC Managed Care – PPO | Admitting: Hematology

## 2022-01-26 DIAGNOSIS — G4733 Obstructive sleep apnea (adult) (pediatric): Secondary | ICD-10-CM | POA: Diagnosis not present

## 2022-02-01 ENCOUNTER — Other Ambulatory Visit: Payer: Self-pay | Admitting: Internal Medicine

## 2022-02-01 DIAGNOSIS — E782 Mixed hyperlipidemia: Secondary | ICD-10-CM

## 2022-02-13 ENCOUNTER — Other Ambulatory Visit: Payer: Self-pay | Admitting: Internal Medicine

## 2022-02-13 DIAGNOSIS — F419 Anxiety disorder, unspecified: Secondary | ICD-10-CM

## 2022-02-25 DIAGNOSIS — G4733 Obstructive sleep apnea (adult) (pediatric): Secondary | ICD-10-CM | POA: Diagnosis not present

## 2022-02-28 ENCOUNTER — Other Ambulatory Visit: Payer: Self-pay | Admitting: Internal Medicine

## 2022-02-28 DIAGNOSIS — E782 Mixed hyperlipidemia: Secondary | ICD-10-CM

## 2022-03-01 DIAGNOSIS — H43812 Vitreous degeneration, left eye: Secondary | ICD-10-CM | POA: Diagnosis not present

## 2022-03-04 ENCOUNTER — Inpatient Hospital Stay (HOSPITAL_COMMUNITY): Payer: BC Managed Care – PPO | Attending: Hematology | Admitting: Hematology

## 2022-03-04 ENCOUNTER — Inpatient Hospital Stay (HOSPITAL_COMMUNITY): Payer: BC Managed Care – PPO

## 2022-03-04 ENCOUNTER — Encounter (HOSPITAL_COMMUNITY): Payer: Self-pay | Admitting: Hematology

## 2022-03-04 VITALS — BP 114/65 | HR 57 | Temp 97.6°F | Resp 16 | Ht 64.0 in | Wt 150.8 lb

## 2022-03-04 DIAGNOSIS — Z801 Family history of malignant neoplasm of trachea, bronchus and lung: Secondary | ICD-10-CM | POA: Diagnosis not present

## 2022-03-04 DIAGNOSIS — K589 Irritable bowel syndrome without diarrhea: Secondary | ICD-10-CM | POA: Diagnosis not present

## 2022-03-04 DIAGNOSIS — Z79899 Other long term (current) drug therapy: Secondary | ICD-10-CM

## 2022-03-04 DIAGNOSIS — G4733 Obstructive sleep apnea (adult) (pediatric): Secondary | ICD-10-CM | POA: Diagnosis not present

## 2022-03-04 DIAGNOSIS — R519 Headache, unspecified: Secondary | ICD-10-CM

## 2022-03-04 DIAGNOSIS — D751 Secondary polycythemia: Secondary | ICD-10-CM | POA: Diagnosis not present

## 2022-03-04 DIAGNOSIS — Z86711 Personal history of pulmonary embolism: Secondary | ICD-10-CM

## 2022-03-04 DIAGNOSIS — Z803 Family history of malignant neoplasm of breast: Secondary | ICD-10-CM | POA: Diagnosis not present

## 2022-03-04 LAB — CBC WITH DIFFERENTIAL/PLATELET
Abs Immature Granulocytes: 0.02 10*3/uL (ref 0.00–0.07)
Basophils Absolute: 0.1 10*3/uL (ref 0.0–0.1)
Basophils Relative: 1 %
Eosinophils Absolute: 0.3 10*3/uL (ref 0.0–0.5)
Eosinophils Relative: 4 %
HCT: 44.6 % (ref 36.0–46.0)
Hemoglobin: 14.4 g/dL (ref 12.0–15.0)
Immature Granulocytes: 0 %
Lymphocytes Relative: 25 %
Lymphs Abs: 1.7 10*3/uL (ref 0.7–4.0)
MCH: 27 pg (ref 26.0–34.0)
MCHC: 32.3 g/dL (ref 30.0–36.0)
MCV: 83.5 fL (ref 80.0–100.0)
Monocytes Absolute: 0.6 10*3/uL (ref 0.1–1.0)
Monocytes Relative: 8 %
Neutro Abs: 4.4 10*3/uL (ref 1.7–7.7)
Neutrophils Relative %: 62 %
Platelets: 213 10*3/uL (ref 150–400)
RBC: 5.34 MIL/uL — ABNORMAL HIGH (ref 3.87–5.11)
RDW: 14.7 % (ref 11.5–15.5)
WBC: 7.1 10*3/uL (ref 4.0–10.5)
nRBC: 0 % (ref 0.0–0.2)

## 2022-03-04 NOTE — Progress Notes (Signed)
[place] [address] [place] [address]   CLINIC:  Medical Oncology/Hematology  Patient Care Team: Lindell Spar, MD as PCP - General (Internal Medicine)  CHIEF COMPLAINTS/PURPOSE OF CONSULTATION:  Evaluation for erythrocytosis  HISTORY OF PRESENTING ILLNESS:  Mallory [name] 66 y.o. female is here because of evaluation of erythrocytosis, at the request of Dr. Posey Pronto.  Today she reports feeling good. She reports a history of erythrocytosis since 2012. She reports she has donated blood twice; the last time she donated was in January 2023. She reports history of sleep apnea for which she uses a CPAP machine daily. She denies history of congenital heart disease. She reports floaters in her left eye accompanied by headaches. She denies itching after showers and changing colors of fingers. She reports a prior unprovoked PE in 2007 following which she was placed on Coumadin for 1 year. She undergoes regular mammograms, and her next colonoscopy is due this year. She denies fevers, night sweats, and unintentional weight loss. She reports she has been trying to lose weight, and she lost 45 lbs via Keto diet 6 months ago. She reports history of IBS. She denies ankle swellings. She denies hematuria.   She denies smoking history. 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 She denies family history of polycythemia. Her maternal grandfather had either lung or pancreatic cancer, and 3 maternal aunts have had breast cancer.   MEDICAL HISTORY:  Past Medical History:  Diagnosis Date   GERD (gastroesophageal reflux disease)    HLD (hyperlipidemia)    IBS (irritable bowel syndrome)    Osteoarthritis of knee    Right; had right TKR   Pulmonary embolism ([place])    5 yrs ago (unknown region)    SURGICAL HISTORY: Past Surgical History:  Procedure Laterality Date   CESAREAN SECTION  1991   mc   CESAREAN SECTION N/A    Phreesia 01/16/2021   CHOLECYSTECTOMY  15 yrs  ago   mc   COLONOSCOPY  05/24/2012   Procedure: COLONOSCOPY;  Surgeon: Rogene Houston, MD;  [facility];  Service: Endoscopy;  Laterality: N/A;  220   ERCP  08/12/2011   Procedure: ENDOSCOPIC RETROGRADE CHOLANGIOPANCREATOGRAPHY (ERCP);  Surgeon: Rogene Houston, MD;  [facility];  Service: Endoscopy;  Laterality: N/A;   ERCP W/ SPHICTEROTOMY  08/12/11   Dr Rehman-?microlithiasis, SOD   ESOPHAGOGASTRODUODENOSCOPY  11/25/2011   Procedure: ESOPHAGOGASTRODUODENOSCOPY (EGD);  Surgeon: Rogene Houston, MD;  [facility];  Service: Endoscopy;  Laterality: N/A;  830   EUS  01/19/2012   Procedure: UPPER ENDOSCOPIC ULTRASOUND (EUS) LINEAR;  Surgeon: Milus Banister, MD;  [facility];  Service: Endoscopy;  Laterality: N/A;  pt moved up a hour early by AW , Patty @ [place] office ok'd / pt was called by [place] 01/16/2021   SPHINCTEROTOMY  08/12/2011   Procedure: Joan [facility];  Surgeon: Rogene Houston, MD;  [facility];  Service: Endoscopy;;  with ballon passage   TOTAL KNEE ARTHROPLASTY Right 09/13/2017   TOTAL KNEE ARTHROPLASTY Right 09/13/2017   Procedure: RIGHT TOTAL KNEE ARTHROPLASTY;  Surgeon: Mcarthur Rossetti, MD;  Location: [facility];  Service: Orthopedics;  Laterality: Right;   TUBAL LIGATION      SOCIAL HISTORY: Social History   Socioeconomic History   Marital status: Married    Spouse name: Not on file   Number of children:  2   Years of education: Not on file   Highest education level: Not on file  Occupational History   Occupation: [employer]    Comment: Medical sales representative  Tobacco Use   Smoking status: Never   Smokeless tobacco: Never  Vaping Use   Vaping Use: Never used  Substance and Sexual Activity   Alcohol use: Yes    Alcohol/week: 0.0 standard drinks    Comment: occasional wine or moonshine, but only on vacation   Drug use: No   Sexual activity: Yes    Birth control/protection: Post-menopausal   Other Topics Concern   Not on file  Social History Narrative    2 daughters      Social Determinants of Health   Financial Resource Strain: Not on file  Food Insecurity: Not on file  Transportation Needs: Not on file  Physical Activity: Not on file  Stress: Not on file  Social Connections: Not on file  Intimate Partner Violence: Not on file    FAMILY HISTORY: Family History  Problem Relation Age of Onset   Diabetes Mother    Heart failure Father    Diabetes Brother    Heart disease Sister    Heart disease Maternal Grandmother    Cancer Maternal Grandfather        ? pancreatic or lung   Anesthesia problems Neg Hx    Hypotension Neg Hx    Malignant hyperthermia Neg Hx    Pseudochol deficiency Neg Hx    Stroke Neg Hx     ALLERGIES:  is allergic to promethazine hcl, phenytoin, hydromorphone, macrolides and ketolides, and nitrofurantoin monohyd macro.  MEDICATIONS:  Current Outpatient Medications  Medication Sig Dispense Refill   aspirin EC 81 MG tablet Take 81 mg by mouth every other day. Swallow whole.     blood glucose meter kit and supplies Dispense based on patient and insurance preference. Use up to four times daily as directed. (FOR ICD-10 E10.9, E11.9). 1 each 0   cholecalciferol (VITAMIN D3) 25 MCG (1000 UNIT) tablet Take 1,000 Units by mouth daily.     glucose blood (CONTOUR NEXT TEST) test strip USE TO CHECK BLOOD SUGAR FOUR TIMES DAILY AS DIRECTED 100 strip 0   hyoscyamine (LEVSIN SL) 0.125 MG SL tablet Take 1 to 2 tablets every 6 hours as needed 50 tablet 3   ibuprofen (ADVIL,MOTRIN) 200 MG tablet Take 400 mg by mouth daily as needed for mild pain or moderate pain. Pain     Microlet Lancets MISC USE TO CHECK BLOOD SUGAR FOUR TIMES DAILY AS DIRECTED 100 each 0   pantoprazole (PROTONIX) 40 MG tablet TAKE (1) TABLET BY MOUTH DAILY SHORTLY BEFORE BREAKFAST MEAL EACH DAY 30 tablet 11   rosuvastatin (CRESTOR) 10 MG tablet TAKE 1 TABLET BY MOUTH DAILY. 30 tablet 0    sertraline (ZOLOFT) 50 MG tablet TAKE ONE TABLET BY MOUTH ONCE DAILY. 30 tablet 0   UNABLE TO FIND Home Sleep Study Kit - use as directed to detect sleep apnea Dx: D75.1; R06.83 1 each 0   No current facility-administered medications for this visit.    REVIEW OF SYSTEMS:   Review of Systems  Constitutional:  Negative for appetite change, fatigue, fever and unexpected weight change.  Eyes:  Positive for eye problems (L eye).  Cardiovascular:  Negative for leg swelling.  Endocrine: Negative for hot flashes.  Genitourinary:  Negative for hematuria.   Skin:  Negative for itching.  Neurological:  Positive for headaches.  Psychiatric/Behavioral:  Positive  for sleep disturbance.   All other systems reviewed and are negative.   PHYSICAL EXAMINATION: ECOG PERFORMANCE STATUS: 0 - Asymptomatic  Vitals:   03/04/22 1010  BP: 114/65  Pulse: (!) 57  Resp: 16  Temp: 97.6 F (36.4 C)  SpO2: 98%   Filed Weights   03/04/22 1010  Weight: 150 lb 12.8 oz (68.4 kg)   Physical Exam Vitals reviewed.  Constitutional:      Appearance: Normal appearance.  Cardiovascular:     Rate and Rhythm: Normal rate and regular rhythm.     Pulses: Normal pulses.     Heart sounds: Normal heart sounds.  Pulmonary:     Effort: Pulmonary effort is normal.     Breath sounds: Normal breath sounds.  Abdominal:     Palpations: Abdomen is soft. There is no hepatomegaly, splenomegaly or mass.     Tenderness: There is no abdominal tenderness.  Musculoskeletal:     Right lower leg: No edema.     Left lower leg: No edema.  Lymphadenopathy:     Upper Body:     Right upper body: No supraclavicular or axillary adenopathy.     Left upper body: No supraclavicular or axillary adenopathy.     Lower Body: No right inguinal adenopathy. No left inguinal adenopathy.  Neurological:     General: No focal deficit present.     Mental Status: She is alert and oriented to person, place, and time.  Psychiatric:        Mood  and Affect: Mood normal.        Behavior: Behavior normal.     LABORATORY DATA:  I have reviewed the data as listed Recent Results (from the past 2160 hour(s))  CBC with Differential/Platelet     Status: Abnormal   Collection Time: 12/23/21  8:08 AM  Result Value Ref Range   WBC 5.3 3.4 - 10.8 x10E3/uL   RBC 5.54 (H) 3.77 - 5.28 x10E6/uL   Hemoglobin 14.9 11.1 - 15.9 g/dL   Hematocrit 44.8 34.0 - 46.6 %   MCV 81 79 - 97 fL   MCH 26.9 26.6 - 33.0 pg   MCHC 33.3 31.5 - 35.7 g/dL   RDW 13.4 11.7 - 15.4 %   Platelets 213 150 - 450 x10E3/uL   Neutrophils 55 Not Estab. %   Lymphs 33 Not Estab. %   Monocytes 8 Not Estab. %   Eos 3 Not Estab. %   Basos 1 Not Estab. %   Neutrophils Absolute 2.9 1.4 - 7.0 x10E3/uL   Lymphocytes Absolute 1.7 0.7 - 3.1 x10E3/uL   Monocytes Absolute 0.4 0.1 - 0.9 x10E3/uL   EOS (ABSOLUTE) 0.2 0.0 - 0.4 x10E3/uL   Basophils Absolute 0.1 0.0 - 0.2 x10E3/uL   Immature Granulocytes 0 Not Estab. %   Immature Grans (Abs) 0.0 0.0 - 0.1 x10E3/uL  CMP14+EGFR     Status: None   Collection Time: 12/23/21  8:08 AM  Result Value Ref Range   Glucose 89 70 - 99 mg/dL   BUN 18 8 - 27 mg/dL   Creatinine, Ser 0.78 0.57 - 1.00 mg/dL   eGFR 85 >59 mL/min/1.73   BUN/Creatinine Ratio 23 12 - 28   Sodium 143 134 - 144 mmol/L   Potassium 4.2 3.5 - 5.2 mmol/L   Chloride 102 96 - 106 mmol/L   CO2 26 20 - 29 mmol/L   Calcium 9.5 8.7 - 10.3 mg/dL   Total Protein 6.6 6.0 - 8.5 g/dL  Albumin 4.2 3.8 - 4.8 g/dL   Globulin, Total 2.4 1.5 - 4.5 g/dL   Albumin/Globulin Ratio 1.8 1.2 - 2.2   Bilirubin Total 0.4 0.0 - 1.2 mg/dL   Alkaline Phosphatase 103 44 - 121 IU/L   AST 20 0 - 40 IU/L   ALT 17 0 - 32 IU/L  Lipid Panel With LDL/HDL Ratio     Status: Abnormal   Collection Time: 12/23/21  8:08 AM  Result Value Ref Range   Cholesterol, Total 239 (H) 100 - 199 mg/dL   Triglycerides 93 0 - 149 mg/dL   HDL 56 >39 mg/dL   VLDL Cholesterol Cal 16 5 - 40 mg/dL   LDL Chol Calc  (NIH) 167 (H) 0 - 99 mg/dL   LDL/HDL Ratio 3.0 0.0 - 3.2 ratio    Comment:                                     LDL/HDL Ratio                                             Men  Women                               1/2 Avg.Risk  1.0    1.5                                   Avg.Risk  3.6    3.2                                2X Avg.Risk  6.2    5.0                                3X Avg.Risk  8.0    6.1   Hemoglobin A1c     Status: Abnormal   Collection Time: 12/23/21  8:08 AM  Result Value Ref Range   Hgb A1c MFr Bld 5.9 (H) 4.8 - 5.6 %    Comment:          Prediabetes: 5.7 - 6.4          Diabetes: >6.4          Glycemic control for adults with diabetes: <7.0    Est. average glucose Bld gHb Est-mCnc 123 mg/dL  CBC with Differential     Status: Abnormal   Collection Time: 03/04/22 10:34 AM  Result Value Ref Range   WBC 7.1 4.0 - 10.5 K/uL   RBC 5.34 (H) 3.87 - 5.11 MIL/uL   Hemoglobin 14.4 12.0 - 15.0 g/dL   HCT 44.6 36.0 - 46.0 %   MCV 83.5 80.0 - 100.0 fL   MCH 27.0 26.0 - 34.0 pg   MCHC 32.3 30.0 - 36.0 g/dL   RDW 14.7 11.5 - 15.5 %   Platelets 213 150 - 400 K/uL   nRBC 0.0 0.0 - 0.2 %   Neutrophils Relative % 62 %   Neutro Abs 4.4 1.7 - 7.7 K/uL   Lymphocytes Relative 25 %  Lymphs Abs 1.7 0.7 - 4.0 K/uL   Monocytes Relative 8 %   Monocytes Absolute 0.6 0.1 - 1.0 K/uL   Eosinophils Relative 4 %   Eosinophils Absolute 0.3 0.0 - 0.5 K/uL   Basophils Relative 1 %   Basophils Absolute 0.1 0.0 - 0.1 K/uL   Immature Granulocytes 0 %   Abs Immature Granulocytes 0.02 0.00 - 0.07 K/uL    Comment: Performed at [facility], [address] [place], [place] [address]    RADIOGRAPHIC STUDIES: I have personally reviewed the radiological images as listed and agreed with the findings in the report. No results found.  ASSESSMENT:  Erythrocytosis: - Seen at the request of Dr. Posey Pronto for erythrocytosis. - She had elevated RBC count since 2012.  Elevated hemoglobin from 2014 through  2022. - She has obstructive sleep apnea and uses CPAP machine.  Non-smoker. - She reports donating blood once in 2022 and second time in early part of 2023. - History of unprovoked pulmonary embolism (03/20/2006), treated with 1 year of Coumadin. - No aquagenic pruritus/vasomotor symptoms.  She lost about 40 pounds 6 months ago switching to keto diet and exercise.  No other B symptoms. - She is up-to-date on mammograms, last one on 09/24/2021 negative.  She reports that she is due for colonoscopy this year.   Social/family: - Lives with husband.  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  Non-smoker. - No family history of polycythemia vera.  Maternal grandfather had lung cancer.  3 maternal aunts had breast cancer.   PLAN:  Erythrocytosis: - We have discussed differential diagnosis of erythrocytosis/polycythemia. - Recommend further work-up to rule out myeloproliferative neoplasms with JAK2 [id number] and reflex testing and serum EPO level. - RTC 2 to 3 weeks to discuss results.   All questions were answered. The patient knows to call the clinic with any problems, questions or concerns.  Derek Jack, MD 03/04/22 11:02 AM  [place] [phone]   I, Thana Ates, am acting as a scribe for Dr. Derek Jack.  I, Derek Jack MD, have reviewed the above documentation for accuracy and completeness, and I agree with the above.

## 2022-03-04 NOTE — Patient Instructions (Signed)
[place] at [facility] Discharge Instructions  You were seen and examined today by Dr. Delton Coombes. Dr. Delton Coombes is a hematologist, meaning that he specializes in blood abnormalities. Dr. Delton Coombes discussed your past medical history, family history of cancers/blood conditions and the events that led to you being here today.  You were referred to Dr. Delton Coombes by Dr. Posey Pronto due to elevated blood counts. Dr. Delton Coombes has recommended additional lab work today in an attempt to identify the cause of your abnormal lab work.  Follow-up as scheduled.   Thank you for choosing [place] at [facility] to provide your oncology and hematology care.  To afford each patient quality time with our provider, please arrive at least 15 minutes before your scheduled appointment time.   If you have a lab appointment with the [facility] please come in thru the Main Entrance and check in at the main information desk.  You need to re-schedule your appointment should you arrive 10 or more minutes late.  We strive to give you quality time with our providers, and arriving late affects you and other patients whose appointments are after yours.  Also, if you no show three or more times for appointments you may be dismissed from the clinic at the providers discretion.     Again, thank you for choosing [facility].  Our hope is that these requests will decrease the amount of time that you wait before being seen by our physicians.       _____________________________________________________________  Should you have questions after your visit to [facility], please contact our office at [phone] and follow the prompts.  Our office hours are 8:00 a.m. and 4:30 p.m. Monday - Friday.  Please note that voicemails left after 4:00 p.m. may not be returned until the following business day.  We are closed weekends and major holidays.  You do have  access to a nurse 24-7, just call the main number to the clinic [phone] and do not press any options, hold on the line and a nurse will answer the phone.    For prescription refill requests, have your pharmacy contact our office and allow 72 hours.    Due to Covid, you will need to wear a mask upon entering the hospital. If you do not have a mask, a mask will be given to you at the Main Entrance upon arrival. For doctor visits, patients may have 1 support person age 32 or older with them. For treatment visits, patients can not have anyone with them due to social distancing guidelines and our immunocompromised population.

## 2022-03-05 LAB — ERYTHROPOIETIN: Erythropoietin: 11.2 m[IU]/mL (ref 2.6–18.5)

## 2022-03-11 LAB — JAK2 V617F RFX CALR/MPL/E12-15

## 2022-03-11 LAB — CALR +MPL + E12-E15  (REFLEX)

## 2022-03-24 NOTE — Progress Notes (Signed)
[place], [place] [address]   CLINIC:  Medical Oncology/Hematology  PCP:  Lindell Spar, MD [address] [place] [address] [phone]   REASON FOR VISIT:  Follow-up for erythrocytosis  PRIOR THERAPY: None  CURRENT THERAPY: Under work-up  INTERVAL HISTORY:  [name] 66 y.o. female returns for routine follow-up of erythrocytosis.  She was seen for initial consultation by Dr. Delton Coombes on 03/04/2022.   She has history of erythrocytosis since 2012, with Hgb peaking at 17.2 (06/15/2013).  Hgb has been within normal range over the past 6 months.  Most recent CBC shows normal Hgb 14.4, normal hematocrit 44.6, but mildly elevated RBC 5.34.    She reports that she was diagnosed with sleep apnea and started using CPAP machine in June 2022, which correlates with when her blood counts started to improve.  She is a lifelong non-smoker.  She denies any known carbon monoxide exposure, but does use a gas heat in her ranch-style house.  She denies any COPD, lung disease, or heart disease.  She donates blood occasionally, most recently in January 2023.  She has history of unprovoked PE in 2007, was treated with warfarin x1 year. She has intermittent "floaters" in her left eye accompanied by headaches.  She denies any itching after showers or changing in the colors of her fingers.  No erythromelalgia. She denies any fevers, night sweats, or unintentional weight loss. She denies any peripheral edema or hematuria.  She has lost weight intentionally with keto diet over the past 6 months. She does not have any family history of polycythemia.  She has 100% energy and 100% appetite. She endorses that she is maintaining a stable weight.   REVIEW OF SYSTEMS:  Review of Systems  Constitutional:  Negative for appetite change, chills, diaphoresis, fatigue, fever and unexpected weight change.  HENT:   Negative for lump/mass and nosebleeds.   Eyes:  Negative  for eye problems.  Respiratory:  Negative for cough, hemoptysis and shortness of breath.   Cardiovascular:  Negative for chest pain, leg swelling and palpitations.  Gastrointestinal:  Negative for abdominal pain, blood in stool, constipation, diarrhea, nausea and vomiting.  Genitourinary:  Negative for hematuria.   Skin: Negative.   Neurological:  Positive for headaches. Negative for dizziness and light-headedness.  Hematological:  Does not bruise/bleed easily.      PAST MEDICAL/SURGICAL HISTORY:  Past Medical History:  Diagnosis Date   GERD (gastroesophageal reflux disease)    HLD (hyperlipidemia)    IBS (irritable bowel syndrome)    Osteoarthritis of knee    Right; had right TKR   Pulmonary embolism (HCC)    5 yrs ago (unknown region)   Past Surgical History:  Procedure Laterality Date   CESAREAN SECTION  1991   mc   CESAREAN SECTION N/A    [employer] 01/16/2021   CHOLECYSTECTOMY  15 yrs ago   mc   COLONOSCOPY  05/24/2012   Procedure: COLONOSCOPY;  Surgeon: Rogene Houston, MD;  [facility];  Service: Endoscopy;  Laterality: N/A;  220   ERCP  08/12/2011   Procedure: ENDOSCOPIC RETROGRADE CHOLANGIOPANCREATOGRAPHY (ERCP);  Surgeon: Rogene Houston, MD;  [facility];  Service: Endoscopy;  Laterality: N/A;   ERCP W/ SPHICTEROTOMY  08/12/11   Dr Rehman-?microlithiasis, SOD   ESOPHAGOGASTRODUODENOSCOPY  11/25/2011   Procedure: ESOPHAGOGASTRODUODENOSCOPY (EGD);  Surgeon: Rogene Houston, MD;  [facility];  Service: Endoscopy;  Laterality: N/A;  830  EUS  01/19/2012   Procedure: UPPER ENDOSCOPIC ULTRASOUND (EUS) LINEAR;  Surgeon: Milus Banister, MD;  [facility];  Service: Endoscopy;  Laterality: N/A;  pt moved up a hour early by AW , Patty @ [place] office ok'd / pt was called by [place] 01/16/2021   SPHINCTEROTOMY  08/12/2011   Procedure: Joan Mayans;  Surgeon: Rogene Houston, MD;  [facility];  Service:  Endoscopy;;  with ballon passage   TOTAL KNEE ARTHROPLASTY Right 09/13/2017   TOTAL KNEE ARTHROPLASTY Right 09/13/2017   Procedure: RIGHT TOTAL KNEE ARTHROPLASTY;  Surgeon: Mcarthur Rossetti, MD;  Location: [facility];  Service: Orthopedics;  Laterality: Right;   TUBAL LIGATION       SOCIAL HISTORY:  Social History   Socioeconomic History   Marital status: Married    Spouse name: Not on file   Number of children: 2   Years of education: Not on file   Highest education level: Not on file  Occupational History   Occupation: [employer]    Comment: Medical sales representative  Tobacco Use   Smoking status: Never   Smokeless tobacco: Never  Vaping Use   Vaping Use: Never used  Substance and Sexual Activity   Alcohol use: Yes    Alcohol/week: 0.0 standard drinks of alcohol    Comment: occasional wine or moonshine, but only on vacation   Drug use: No   Sexual activity: Yes    Birth control/protection: Post-menopausal  Other Topics Concern   Not on file  Social History Narrative    2 daughters      Social Determinants of Health   Financial Resource Strain: Not on file  Food Insecurity: Not on file  Transportation Needs: Not on file  Physical Activity: Not on file  Stress: Not on file  Social Connections: Not on file  Intimate Partner Violence: Not on file    FAMILY HISTORY:  Family History  Problem Relation Age of Onset   Diabetes Mother    Heart failure Father    Diabetes Brother    Heart disease Sister    Heart disease Maternal Grandmother    Cancer Maternal Grandfather        ? pancreatic or lung   Anesthesia problems Neg Hx    Hypotension Neg Hx    Malignant hyperthermia Neg Hx    Pseudochol deficiency Neg Hx    Stroke Neg Hx     CURRENT MEDICATIONS:  Outpatient Encounter Medications as of 03/25/2022  Medication Sig Note   aspirin EC 81 MG tablet Take 81 mg by mouth every other day. Swallow whole.    blood glucose meter kit and supplies Dispense based on patient and  insurance preference. Use up to four times daily as directed. (FOR ICD-10 E10.9, E11.9).    cholecalciferol (VITAMIN D3) 25 MCG (1000 UNIT) tablet Take 1,000 Units by mouth daily.    glucose blood (CONTOUR NEXT TEST) test strip USE TO CHECK BLOOD SUGAR FOUR TIMES DAILY AS DIRECTED    hyoscyamine (LEVSIN SL) 0.125 MG SL tablet Take 1 to 2 tablets every 6 hours as needed    ibuprofen (ADVIL,MOTRIN) 200 MG tablet Take 400 mg by mouth daily as needed for mild pain or moderate pain. Pain 09/21/2017: States not taking due to other meds, MD aware.    Microlet Lancets MISC USE TO CHECK BLOOD SUGAR FOUR TIMES DAILY AS DIRECTED    pantoprazole (PROTONIX) 40 MG tablet TAKE (1) TABLET  BY MOUTH DAILY SHORTLY BEFORE BREAKFAST MEAL EACH DAY    rosuvastatin (CRESTOR) 10 MG tablet TAKE 1 TABLET BY MOUTH DAILY.    sertraline (ZOLOFT) 50 MG tablet TAKE ONE TABLET BY MOUTH ONCE DAILY.    UNABLE TO FIND Home Sleep Study Kit - use as directed to detect sleep apnea Dx: D75.1; R06.83    No facility-administered encounter medications on file as of 03/25/2022.    ALLERGIES:  Allergies  Allergen Reactions   Promethazine Hcl Hives   Phenytoin Itching   Hydromorphone Itching   Macrolides And Ketolides Itching   Nitrofurantoin Monohyd Macro Itching     PHYSICAL EXAM:  ECOG PERFORMANCE STATUS: 0 - Asymptomatic  There were no vitals filed for this visit. There were no vitals filed for this visit. Physical Exam Constitutional:      Appearance: Normal appearance.  HENT:     Head: Normocephalic and atraumatic.     Mouth/Throat:     Mouth: Mucous membranes are moist.  Eyes:     Extraocular Movements: Extraocular movements intact.     Pupils: Pupils are equal, round, and reactive to light.  Cardiovascular:     Rate and Rhythm: Normal rate and regular rhythm.     Pulses: Normal pulses.     Heart sounds: Normal heart sounds.  Pulmonary:     Effort: Pulmonary effort is normal.     Breath sounds: Normal breath  sounds.  Abdominal:     General: Bowel sounds are normal.     Palpations: Abdomen is soft.     Tenderness: There is no abdominal tenderness.  Musculoskeletal:        General: No swelling.     Right lower leg: No edema.     Left lower leg: No edema.  Lymphadenopathy:     Cervical: No cervical adenopathy.  Skin:    General: Skin is warm and dry.  Neurological:     General: No focal deficit present.     Mental Status: She is alert and oriented to person, place, and time.  Psychiatric:        Mood and Affect: Mood normal.        Behavior: Behavior normal.      LABORATORY DATA:  I have reviewed the labs as listed.  CBC    Component Value Date/Time   WBC 7.1 03/04/2022 1034   RBC 5.34 (H) 03/04/2022 1034   HGB 14.4 03/04/2022 1034   HGB 14.9 12/23/2021 0808   HCT 44.6 03/04/2022 1034   HCT 44.8 12/23/2021 0808   PLT 213 03/04/2022 1034   PLT 213 12/23/2021 0808   MCV 83.5 03/04/2022 1034   MCV 81 12/23/2021 0808   MCH 27.0 03/04/2022 1034   MCHC 32.3 03/04/2022 1034   RDW 14.7 03/04/2022 1034   RDW 13.4 12/23/2021 0808   LYMPHSABS 1.7 03/04/2022 1034   LYMPHSABS 1.7 12/23/2021 0808   MONOABS 0.6 03/04/2022 1034   EOSABS 0.3 03/04/2022 1034   EOSABS 0.2 12/23/2021 0808   BASOSABS 0.1 03/04/2022 1034   BASOSABS 0.1 12/23/2021 0808      Latest Ref Rng & Units 12/23/2021    8:08 AM 09/22/2021    8:04 AM 05/10/2021    8:04 AM  CMP  Glucose 70 - 99 mg/dL 89  90  79   BUN 8 - 27 mg/dL 18  15  13    Creatinine 0.57 - 1.00 mg/dL 0.78  0.75  0.67   Sodium 134 - 144 mmol/L 143  141  142   Potassium 3.5 - 5.2 mmol/L 4.2  4.1  4.2   Chloride 96 - 106 mmol/L 102  102  103   CO2 20 - 29 mmol/L 26  25  23    Calcium 8.7 - 10.3 mg/dL 9.5  9.6  9.5   Total Protein 6.0 - 8.5 g/dL 6.6  6.7  6.6   Total Bilirubin 0.0 - 1.2 mg/dL 0.4  0.3  0.5   Alkaline Phos 44 - 121 IU/L 103  121  111   AST 0 - 40 IU/L 20  18  26    ALT 0 - 32 IU/L 17  25  27      DIAGNOSTIC IMAGING:  I have  independently reviewed the relevant imaging and discussed with the patient.  ASSESSMENT & PLAN: 1.  Erythrocytosis, secondary polycythemia from OSA - Seen at the request of Dr. Posey Pronto for erythrocytosis. - She had elevated RBC count since 2012.  Elevated hemoglobin from 2014 through 2022 - Hgb peaked at 17.2 (06/15/2013), has been normal since October 2022. -Most recent CBC (03/04/2022) normal Hgb 14.4, normal hematocrit 44.6, mildly elevated RBC 5.34 - She donated blood in 2022 and again in January 2023 - She is a lifelong non-smoker. - She has obstructive sleep apnea, uses CPAP regularly - was diagnosed in June 2022 - No COPD, lung disease, or known heart disease - No known carbon monoxide exposure. - History of unprovoked pulmonary embolism (03/20/2006), treated with 1 year of Coumadin. - No aquagenic pruritus/vasomotor symptoms.  She lost about 40 pounds 6 months ago switching to keto diet and exercise.  No other B symptoms.  - Hematology work-up (03/04/2022): Normal mid range erythropoietin 11.2 JAK2 with reflex to CALR, MPL, and Exons 12-15 was negative - No evidence of myeloproliferative neoplasm or polycythemia vera. - Differential diagnosis favors mild secondary polycythemia/erythrocytosis in the setting of obstructive sleep apnea.  Hemoglobin has improved since she started CPAP (June 2022) and after her blood donation in January 2023.   - PLAN: No indication for treatment at this time.  Continue excellent use of CPAP.  We will repeat labs and discuss at follow up visit in 6 months.  Would consider therapeutic phlebotomy if severe vasomotor symptoms or HCT >54.0. Continue aspirin 81 mg daily. - I have asked her to hold off on any blood donation for the next 6 months, to see if she has any worsening in her blood counts without phlebotomy. - If stable blood counts at follow-up, will discharge from clinic.  2.  Social/family: - Lives with husband.  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  Non-smoker. - No family history of polycythemia vera.  Maternal grandfather had lung cancer.  3 maternal aunts had breast cancer. - She is up-to-date on mammograms, last one on 09/24/2021 negative.  She reports that she is due for colonoscopy this year.   PLAN SUMMARY & DISPOSITION: Same-day labs and office visit in 6 months  All questions were answered. The patient knows to call the clinic with any problems, questions or concerns.  Medical decision making: Low  Time spent on visit: I spent 15 minutes counseling the patient face to face. The total time spent in the appointment was 25 minutes and more than 50% was on counseling.   Harriett Rush, PA-C  03/25/2022 12:59 PM

## 2022-03-25 ENCOUNTER — Inpatient Hospital Stay (HOSPITAL_COMMUNITY): Payer: BC Managed Care – PPO | Attending: Hematology | Admitting: Physician Assistant

## 2022-03-25 VITALS — BP 127/69 | HR 53 | Temp 97.2°F | Resp 18 | Ht 64.0 in | Wt 153.4 lb

## 2022-03-25 DIAGNOSIS — Z7982 Long term (current) use of aspirin: Secondary | ICD-10-CM | POA: Insufficient documentation

## 2022-03-25 DIAGNOSIS — D751 Secondary polycythemia: Secondary | ICD-10-CM | POA: Diagnosis not present

## 2022-03-25 DIAGNOSIS — G4733 Obstructive sleep apnea (adult) (pediatric): Secondary | ICD-10-CM | POA: Diagnosis not present

## 2022-03-25 DIAGNOSIS — Z86711 Personal history of pulmonary embolism: Secondary | ICD-10-CM | POA: Diagnosis not present

## 2022-03-25 NOTE — Patient Instructions (Signed)
[place] at [facility] Discharge Instructions  You were seen today by Tarri Abernethy PA-C for your elevated red blood cells.  This is most likely from your sleep apnea - continue to use your CPAP regularly to keep your blood cells from going to high.  Continue aspirin 81 mg daily to decrease your risk of blood clots.  FOLLOW-UP APPOINTMENT: Same-day labs and office visit in 6 months   Thank you for choosing [place] at [facility] to provide your oncology and hematology care.  To afford each patient quality time with our provider, please arrive at least 15 minutes before your scheduled appointment time.   If you have a lab appointment with the [facility] please come in thru the Main Entrance and check in at the main information desk.  You need to re-schedule your appointment should you arrive 10 or more minutes late.  We strive to give you quality time with our providers, and arriving late affects you and other patients whose appointments are after yours.  Also, if you no show three or more times for appointments you may be dismissed from the clinic at the providers discretion.     Again, thank you for choosing [facility].  Our hope is that these requests will decrease the amount of time that you wait before being seen by our physicians.       _____________________________________________________________  Should you have questions after your visit to [facility], please contact our office at [phone] and follow the prompts.  Our office hours are 8:00 a.m. and 4:30 p.m. Monday - Friday.  Please note that voicemails left after 4:00 p.m. may not be returned until the following business day.  We are closed weekends and major holidays.  You do have access to a nurse 24-7, just call the main number to the clinic [phone] and do not press any options, hold on the line and a nurse will answer the phone.    For  prescription refill requests, have your pharmacy contact our office and allow 72 hours.    Due to Covid, you will need to wear a mask upon entering the hospital. If you do not have a mask, a mask will be given to you at the Main Entrance upon arrival. For doctor visits, patients may have 1 support person age 64 or older with them. For treatment visits, patients can not have anyone with them due to social distancing guidelines and our immunocompromised population.

## 2022-03-28 DIAGNOSIS — G4733 Obstructive sleep apnea (adult) (pediatric): Secondary | ICD-10-CM | POA: Diagnosis not present

## 2022-04-01 ENCOUNTER — Other Ambulatory Visit: Payer: Self-pay | Admitting: Family Medicine

## 2022-04-01 DIAGNOSIS — E782 Mixed hyperlipidemia: Secondary | ICD-10-CM

## 2022-04-14 ENCOUNTER — Other Ambulatory Visit: Payer: Self-pay | Admitting: Internal Medicine

## 2022-04-14 DIAGNOSIS — F419 Anxiety disorder, unspecified: Secondary | ICD-10-CM

## 2022-04-27 DIAGNOSIS — G4733 Obstructive sleep apnea (adult) (pediatric): Secondary | ICD-10-CM | POA: Diagnosis not present

## 2022-05-04 ENCOUNTER — Other Ambulatory Visit: Payer: Self-pay | Admitting: Internal Medicine

## 2022-05-04 DIAGNOSIS — E782 Mixed hyperlipidemia: Secondary | ICD-10-CM

## 2022-05-13 ENCOUNTER — Other Ambulatory Visit: Payer: Self-pay | Admitting: Internal Medicine

## 2022-05-13 ENCOUNTER — Other Ambulatory Visit: Payer: Self-pay | Admitting: Gastroenterology

## 2022-05-13 DIAGNOSIS — K219 Gastro-esophageal reflux disease without esophagitis: Secondary | ICD-10-CM

## 2022-05-13 DIAGNOSIS — F419 Anxiety disorder, unspecified: Secondary | ICD-10-CM

## 2022-06-08 ENCOUNTER — Other Ambulatory Visit: Payer: Self-pay | Admitting: Gastroenterology

## 2022-06-08 ENCOUNTER — Ambulatory Visit (INDEPENDENT_AMBULATORY_CARE_PROVIDER_SITE_OTHER): Payer: BC Managed Care – PPO | Admitting: Orthopaedic Surgery

## 2022-06-08 ENCOUNTER — Encounter: Payer: Self-pay | Admitting: Orthopaedic Surgery

## 2022-06-08 DIAGNOSIS — M1812 Unilateral primary osteoarthritis of first carpometacarpal joint, left hand: Secondary | ICD-10-CM

## 2022-06-08 MED ORDER — METHYLPREDNISOLONE ACETATE 40 MG/ML IJ SUSP
40.0000 mg | INTRAMUSCULAR | Status: AC | PRN
  Administered 2022-06-08: 40 mg

## 2022-06-08 MED ORDER — LIDOCAINE HCL 1 % IJ SOLN
1.0000 mL | INTRAMUSCULAR | Status: AC | PRN
  Administered 2022-06-08: 1 mL

## 2022-06-08 NOTE — Progress Notes (Signed)
Office Visit Note   Patient: Mallory [name]           Date of Birth: [birth year]           MRN: [id number] Visit Date: 06/08/2022              Requested by: Lindell Spar, MD [address] [place],  [place] [address] PCP: Lindell Spar, MD   Assessment & Plan: Visit Diagnoses:  1. Arthritis of carpometacarpal (CMC) joint of left thumb     Plan: Her signs and symptoms are consistent with basilar thumb joint arthritis of the CMC joint.  I recommended a steroid injection and Voltaren gel topically to try.  She agreed to this and did tolerate the steroid injection well.  Hopefully this will calm things down for some time now.  If it worsens and does not improve she should come back and see [place].  I would then likely send her to a hand specialist.  If she does come in again for the same problem we will need 3 views of her left hand as well x-ray wise.  Follow-Up Instructions: Return if symptoms worsen or fail to improve.   Orders:  Orders Placed This Encounter  Procedures   Hand/UE Inj   No orders of the defined types were placed in this encounter.     Procedures: Hand/UE Inj: L thumb CMC for osteoarthritis on 06/08/2022 10:25 AM Medications: 1 mL lidocaine 1 %; 40 mg methylPREDNISolone acetate 40 MG/ML      Clinical Data: No additional findings.   Subjective: Chief Complaint  Patient presents with   Left Thumb - Pain  The patient comes in today with the base of her left thumb hurting her quite a bit for a while now.  It has been getting worse with gripping and writing activities and using the hand in general.  She points to the base of her thumb as a source of her pain.  There is no numbness and tingling and no locking catching.  She denies any trigger thumb.  HPI  Review of Systems There is currently listed no fever, chills, nausea, vomiting  Objective: Vital Signs: LMP 05/11/2011   Physical Exam She is alert and orient x3 and in no acute distress Ortho  Exam Examination of her left hand shows palpable pulses of the radial and ulnar pulse.  Her fingers are well-perfused.  She has good grip and pinch strength but it definitely hurts at the base of her left thumb.  There is grinding at the [facility] joint.  There is no pain over the A1 pulley of the thumb and no triggering.  Her sensory exam is normal.  There is no muscle atrophy. Specialty Comments:  No specialty comments available.  Imaging: No results found.   PMFS History: Patient Active Problem List   Diagnosis Date Noted   Sleep apnea 05/13/2021   Type 2 diabetes mellitus without complications ([place]) 85/46/2703   Anxiety 02/03/2021   Erythrocytosis 02/03/2021   Allergic rhinitis 09/29/2020   OA (osteoarthritis) 01/31/2018   IBS (irritable bowel syndrome) 12/06/2016   HLD (hyperlipidemia) 11/16/2015   Abnormal cervical Pap smear with positive HPV DNA test 08/03/2014   CIN I (cervical intraepithelial neoplasia I) 08/03/2014   Microscopic hematuria 08/03/2014   PMB (postmenopausal bleeding) 08/03/2014   GERD (gastroesophageal reflux disease) 08/14/2011   History of pulmonary embolus (PE) 08/14/2011   Past Medical History:  Diagnosis Date   GERD (gastroesophageal reflux disease)  HLD (hyperlipidemia)    IBS (irritable bowel syndrome)    Osteoarthritis of knee    Right; had right TKR   Pulmonary embolism (HCC)    5 yrs ago (unknown region)    Family History  Problem Relation Age of Onset   Diabetes Mother    Heart failure Father    Diabetes Brother    Heart disease Sister    Heart disease Maternal Grandmother    Cancer Maternal Grandfather        ? pancreatic or lung   Anesthesia problems Neg Hx    Hypotension Neg Hx    Malignant hyperthermia Neg Hx    Pseudochol deficiency Neg Hx    Stroke Neg Hx     Past Surgical History:  Procedure Laterality Date   CESAREAN SECTION  1991   mc   CESAREAN SECTION N/A    [employer] 01/16/2021   CHOLECYSTECTOMY  15 yrs ago   mc    COLONOSCOPY  05/24/2012   Procedure: COLONOSCOPY;  Surgeon: Rogene Houston, MD;  [facility];  Service: Endoscopy;  Laterality: N/A;  220   ERCP  08/12/2011   Procedure: ENDOSCOPIC RETROGRADE CHOLANGIOPANCREATOGRAPHY (ERCP);  Surgeon: Rogene Houston, MD;  [facility];  Service: Endoscopy;  Laterality: N/A;   ERCP W/ SPHICTEROTOMY  08/12/11   Dr Rehman-?microlithiasis, SOD   ESOPHAGOGASTRODUODENOSCOPY  11/25/2011   Procedure: ESOPHAGOGASTRODUODENOSCOPY (EGD);  Surgeon: Rogene Houston, MD;  [facility];  Service: Endoscopy;  Laterality: N/A;  830   EUS  01/19/2012   Procedure: UPPER ENDOSCOPIC ULTRASOUND (EUS) LINEAR;  Surgeon: Milus Banister, MD;  [facility];  Service: Endoscopy;  Laterality: N/A;  pt moved up a hour early by AW , Patty @ [place] office ok'd / pt was called by [place] 01/16/2021   SPHINCTEROTOMY  08/12/2011   Procedure: Joan Mayans;  Surgeon: Rogene Houston, MD;  [facility];  Service: Endoscopy;;  with ballon passage   TOTAL KNEE ARTHROPLASTY Right 09/13/2017   TOTAL KNEE ARTHROPLASTY Right 09/13/2017   Procedure: RIGHT TOTAL KNEE ARTHROPLASTY;  Surgeon: Mcarthur Rossetti, MD;  Location: [place];  Service: Orthopedics;  Laterality: Right;   TUBAL LIGATION     Social History   Occupational History   Occupation: [employer]    Comment: Medical sales representative  Tobacco Use   Smoking status: Never   Smokeless tobacco: Never  Vaping Use   Vaping Use: Never used  Substance and Sexual Activity   Alcohol use: Yes    Alcohol/week: 0.0 standard drinks of alcohol    Comment: occasional wine or moonshine, but only on vacation   Drug use: No   Sexual activity: Yes    Birth control/protection: Post-menopausal

## 2022-06-24 DIAGNOSIS — E119 Type 2 diabetes mellitus without complications: Secondary | ICD-10-CM | POA: Diagnosis not present

## 2022-06-24 DIAGNOSIS — E782 Mixed hyperlipidemia: Secondary | ICD-10-CM | POA: Diagnosis not present

## 2022-06-24 DIAGNOSIS — D751 Secondary polycythemia: Secondary | ICD-10-CM | POA: Diagnosis not present

## 2022-06-24 DIAGNOSIS — E559 Vitamin D deficiency, unspecified: Secondary | ICD-10-CM | POA: Diagnosis not present

## 2022-06-25 LAB — CMP14+EGFR
ALT: 24 IU/L (ref 0–32)
AST: 24 IU/L (ref 0–40)
Albumin/Globulin Ratio: 1.9 (ref 1.2–2.2)
Albumin: 4.3 g/dL (ref 3.9–4.9)
Alkaline Phosphatase: 110 IU/L (ref 44–121)
BUN/Creatinine Ratio: 16 (ref 12–28)
BUN: 13 mg/dL (ref 8–27)
Bilirubin Total: 0.4 mg/dL (ref 0.0–1.2)
CO2: 27 mmol/L (ref 20–29)
Calcium: 9.5 mg/dL (ref 8.7–10.3)
Chloride: 102 mmol/L (ref 96–106)
Creatinine, Ser: 0.83 mg/dL (ref 0.57–1.00)
Globulin, Total: 2.3 g/dL (ref 1.5–4.5)
Glucose: 94 mg/dL (ref 70–99)
Potassium: 4 mmol/L (ref 3.5–5.2)
Sodium: 141 mmol/L (ref 134–144)
Total Protein: 6.6 g/dL (ref 6.0–8.5)
eGFR: 79 mL/min/{1.73_m2} (ref >59)

## 2022-06-25 LAB — CBC WITH DIFFERENTIAL/PLATELET
Basophils Absolute: 0 10*3/uL (ref 0.0–0.2)
Basos: 1 %
EOS (ABSOLUTE): 0.3 10*3/uL (ref 0.0–0.4)
Eos: 4 %
Hematocrit: 47.1 % — ABNORMAL HIGH (ref 34.0–46.6)
Hemoglobin: 15.2 g/dL (ref 11.1–15.9)
Immature Grans (Abs): 0 10*3/uL (ref 0.0–0.1)
Immature Granulocytes: 0 %
Lymphocytes Absolute: 2 10*3/uL (ref 0.7–3.1)
Lymphs: 28 %
MCH: 25.8 pg — ABNORMAL LOW (ref 26.6–33.0)
MCHC: 32.3 g/dL (ref 31.5–35.7)
MCV: 80 fL (ref 79–97)
Monocytes Absolute: 0.6 10*3/uL (ref 0.1–0.9)
Monocytes: 8 %
Neutrophils Absolute: 4.1 10*3/uL (ref 1.4–7.0)
Neutrophils: 59 %
Platelets: 220 10*3/uL (ref 150–450)
RBC: 5.89 x10E6/uL — ABNORMAL HIGH (ref 3.77–5.28)
RDW: 14.3 % (ref 11.7–15.4)
WBC: 7 10*3/uL (ref 3.4–10.8)

## 2022-06-25 LAB — HEMOGLOBIN A1C
Est. average glucose Bld gHb Est-mCnc: 134 mg/dL
Hgb A1c MFr Bld: 6.3 % — ABNORMAL HIGH (ref 4.8–5.6)

## 2022-06-25 LAB — LIPID PANEL
Chol/HDL Ratio: 2.6 ratio (ref 0.0–4.4)
Cholesterol, Total: 177 mg/dL (ref 100–199)
HDL: 69 mg/dL (ref >39)
LDL Chol Calc (NIH): 89 mg/dL (ref 0–99)
Triglycerides: 108 mg/dL (ref 0–149)
VLDL Cholesterol Cal: 19 mg/dL (ref 5–40)

## 2022-06-25 LAB — VITAMIN D 25 HYDROXY (VIT D DEFICIENCY, FRACTURES): Vit D, 25-Hydroxy: 56.4 ng/mL (ref 30.0–100.0)

## 2022-06-29 ENCOUNTER — Encounter: Payer: Self-pay | Admitting: Internal Medicine

## 2022-06-29 ENCOUNTER — Ambulatory Visit (INDEPENDENT_AMBULATORY_CARE_PROVIDER_SITE_OTHER): Payer: BC Managed Care – PPO | Admitting: Internal Medicine

## 2022-06-29 VITALS — BP 115/70 | HR 62 | Ht 64.0 in | Wt 161.0 lb

## 2022-06-29 DIAGNOSIS — D751 Secondary polycythemia: Secondary | ICD-10-CM

## 2022-06-29 DIAGNOSIS — Z23 Encounter for immunization: Secondary | ICD-10-CM

## 2022-06-29 DIAGNOSIS — G4733 Obstructive sleep apnea (adult) (pediatric): Secondary | ICD-10-CM

## 2022-06-29 DIAGNOSIS — Z1211 Encounter for screening for malignant neoplasm of colon: Secondary | ICD-10-CM

## 2022-06-29 DIAGNOSIS — E782 Mixed hyperlipidemia: Secondary | ICD-10-CM | POA: Diagnosis not present

## 2022-06-29 DIAGNOSIS — E1169 Type 2 diabetes mellitus with other specified complication: Secondary | ICD-10-CM

## 2022-06-29 DIAGNOSIS — F419 Anxiety disorder, unspecified: Secondary | ICD-10-CM

## 2022-06-29 MED ORDER — ROSUVASTATIN CALCIUM 10 MG PO TABS: 10.0000 mg | ORAL_TABLET | Freq: Every day | ORAL | 3 refills | Status: DC

## 2022-06-29 NOTE — Patient Instructions (Signed)
Please continue to take medications as prescribed. ? ?Please continue to follow low carb diet and perform moderate exercise/walking at least 150 mins/week. ?

## 2022-06-29 NOTE — Assessment & Plan Note (Addendum)
Lab Results  Component Value Date   HGBA1C 6.3 (H) 06/24/2022    Diet controlled Associated with HLD, GERD and GAD Advised to follow diabetic diet On statin F/u CMP and lipid panel Diabetic eye exam: Advised to follow up with Ophthalmology for diabetic eye exam

## 2022-06-29 NOTE — Progress Notes (Signed)
Established Patient Office Visit  Subjective:  Patient ID: Mallory [name], female    DOB: [birth year]  Age: 66 y.o. MRN: [id number]  CC:  Chief Complaint  Patient presents with   Follow-up    HPI [name] is a 66 y.o. female with past medical history of type II DM with HLD, GERD, IBS, and GAD who presents for f/u of her chronic medical conditions.  Type II DM with HLD: Diet controlled DM.  Her HbA1c has increased to 6.3 now.  She reports that she has not been following strict low-carb diet recently.  She has been taking Crestor for HLD and tolerates it well.  Her lipid profile shows improvement in LDL.  Erythrocytosis: Her CBC shows Hb of 15.2.  She has seen Hematology for it.  Her work-up for primary polycythemia was negative.  Her elevated Hb is thought to be likely due to OSA.  She has not done blood donation recently.  She denies any itching or active bleeding currently.  She currently takes aspirin, but notices easy bruising.  GAD: She takes Zoloft.  Denies spells of anxiety, anhedonia, SI or HI.  She received flu and second dose of Shingrix vaccine in the office today.  Past Medical History:  Diagnosis Date   GERD (gastroesophageal reflux disease)    HLD (hyperlipidemia)    IBS (irritable bowel syndrome)    Osteoarthritis of knee    Right; had right TKR   Pulmonary embolism (HCC)    5 yrs ago (unknown region)    Past Surgical History:  Procedure Laterality Date   CESAREAN SECTION  1991   [facility]   CESAREAN SECTION N/A    [facility] 01/16/2021   CHOLECYSTECTOMY  15 yrs ago   [facility]   COLONOSCOPY  05/24/2012   Procedure: COLONOSCOPY;  Surgeon: Rogene Houston, MD;  Location: [facility];  Service: Endoscopy;  Laterality: N/A;  220   ERCP  08/12/2011   Procedure: ENDOSCOPIC RETROGRADE CHOLANGIOPANCREATOGRAPHY (ERCP);  Surgeon: Rogene Houston, MD;  Location: [facility];  Service: Endoscopy;  Laterality: N/A;   ERCP W/ SPHICTEROTOMY  08/12/11   Dr Rehman-?microlithiasis, SOD    ESOPHAGOGASTRODUODENOSCOPY  11/25/2011   Procedure: ESOPHAGOGASTRODUODENOSCOPY (EGD);  Surgeon: Rogene Houston, MD;  Location: [facility];  Service: Endoscopy;  Laterality: N/A;  830   EUS  01/19/2012   Procedure: UPPER ENDOSCOPIC ULTRASOUND (EUS) LINEAR;  Surgeon: Milus Banister, MD;  Location: [facility];  Service: Endoscopy;  Laterality: N/A;  pt moved up a hour early by AW , Patty @ [place] office ok'd / pt was called by [place] 01/16/2021   SPHINCTEROTOMY  08/12/2011   Procedure: Joan Mayans;  Surgeon: Rogene Houston, MD;  Location: [facility];  Service: Endoscopy;;  with ballon passage   TOTAL KNEE ARTHROPLASTY Right 09/13/2017   TOTAL KNEE ARTHROPLASTY Right 09/13/2017   Procedure: RIGHT TOTAL KNEE ARTHROPLASTY;  Surgeon: Mcarthur Rossetti, MD;  Location: [facility];  Service: Orthopedics;  Laterality: Right;   TUBAL LIGATION      Family History  Problem Relation Age of Onset   Diabetes Mother    Heart failure Father    Diabetes Brother    Heart disease Sister    Heart disease Maternal Grandmother    Cancer Maternal Grandfather        ? pancreatic or lung   Anesthesia problems Neg Hx    Hypotension Neg Hx    Malignant  hyperthermia Neg Hx    Pseudochol deficiency Neg Hx    Stroke Neg Hx     Social History   Socioeconomic History   Marital status: Married    Spouse name: Not on file   Number of children: 2   Years of education: Not on file   Highest education level: Not on file  Occupational History   Occupation: [employer]    Comment: Medical sales representative  Tobacco Use   Smoking status: Never   Smokeless tobacco: Never  Vaping Use   Vaping Use: Never used  Substance and Sexual Activity   Alcohol use: Yes    Alcohol/week: 0.0 standard drinks of alcohol    Comment: occasional wine or moonshine, but only on vacation   Drug use: No   Sexual activity: Yes    Birth control/protection: Post-menopausal  Other Topics Concern   Not on file   Social History Narrative    2 daughters      Social Determinants of Health   Financial Resource Strain: Not on file  Food Insecurity: Not on file  Transportation Needs: Not on file  Physical Activity: Not on file  Stress: Not on file  Social Connections: Not on file  Intimate Partner Violence: Not on file    Outpatient Medications Prior to Visit  Medication Sig Dispense Refill   aspirin EC 81 MG tablet Take 81 mg by mouth every other day. Swallow whole.     blood glucose meter kit and supplies Dispense based on patient and insurance preference. Use up to four times daily as directed. (FOR ICD-10 E10.9, E11.9). 1 each 0   cholecalciferol (VITAMIN D3) 25 MCG (1000 UNIT) tablet Take 1,000 Units by mouth daily.     glucose blood (CONTOUR NEXT TEST) test strip USE TO CHECK BLOOD SUGAR FOUR TIMES DAILY AS DIRECTED 100 strip 0   hyoscyamine (LEVSIN SL) 0.125 MG SL tablet TAKE 1 OR 2 TABLETS BY MOUTH EVERY 6 HOURS AS NEEDED 50 tablet 0   ibuprofen (ADVIL,MOTRIN) 200 MG tablet Take 400 mg by mouth daily as needed for mild pain or moderate pain. Pain     Microlet Lancets MISC USE TO CHECK BLOOD SUGAR FOUR TIMES DAILY AS DIRECTED 100 each 0   pantoprazole (PROTONIX) 40 MG tablet TAKE (1) TABLET BY MOUTH DAILY SHORTLY BEFORE BREAKFAST MEAL EACH DAY 30 tablet 6   sertraline (ZOLOFT) 50 MG tablet TAKE ONE TABLET BY MOUTH ONCE DAILY. 90 tablet 1   UNABLE TO FIND Home Sleep Study Kit - use as directed to detect sleep apnea Dx: D75.1; R06.83 1 each 0   rosuvastatin (CRESTOR) 10 MG tablet TAKE 1 TABLET BY MOUTH DAILY. 30 tablet 5   No facility-administered medications prior to visit.    Allergies  Allergen Reactions   Promethazine Hcl Hives   Phenytoin Itching   Hydromorphone Itching   Macrolides And Ketolides Itching   Nitrofurantoin Monohyd Macro Itching    ROS Review of Systems  Constitutional:  Negative for chills and fever.  HENT:  Negative for congestion, sinus pressure, sinus pain  and sore throat.   Eyes:  Negative for pain and discharge.  Respiratory:  Negative for cough and shortness of breath.   Cardiovascular:  Negative for chest pain and palpitations.  Gastrointestinal:  Negative for abdominal pain, diarrhea, nausea and vomiting.  Endocrine: Negative for polydipsia and polyuria.  Genitourinary:  Negative for dysuria and hematuria.  Musculoskeletal:  Negative for neck pain and neck stiffness.  Skin:  Negative for  rash.  Neurological:  Negative for dizziness and weakness.  Psychiatric/Behavioral:  Negative for agitation and behavioral problems.       Objective:    Physical Exam Vitals reviewed.  Constitutional:      General: She is not in acute distress.    Appearance: She is not diaphoretic.  HENT:     Head: Normocephalic and atraumatic.     Nose: Nose normal. No congestion.     Mouth/Throat:     Mouth: Mucous membranes are moist.     Pharynx: No posterior oropharyngeal erythema.  Eyes:     General: No scleral icterus.    Extraocular Movements: Extraocular movements intact.  Cardiovascular:     Rate and Rhythm: Normal rate and regular rhythm.     Pulses: Normal pulses.     Heart sounds: Normal heart sounds. No murmur heard. Pulmonary:     Breath sounds: Normal breath sounds. No wheezing or rales.  Musculoskeletal:     Cervical back: Neck supple. No tenderness.     Right lower leg: No edema.     Left lower leg: No edema.  Skin:    General: Skin is warm.     Findings: No rash.  Neurological:     General: No focal deficit present.     Mental Status: She is alert and oriented to person, place, and time.  Psychiatric:        Mood and Affect: Mood normal.        Behavior: Behavior normal.     BP 115/70   Pulse 62   Ht 5' 4"  (1.626 m)   Wt 161 lb (73 kg)   LMP 05/11/2011   SpO2 95%   BMI 27.64 kg/m  Wt Readings from Last 3 Encounters:  06/29/22 161 lb (73 kg)  03/25/22 153 lb 7 oz (69.6 kg)  03/04/22 150 lb 12.8 oz (68.4 kg)     Lab Results  Component Value Date   TSH 3.620 09/22/2021   Lab Results  Component Value Date   WBC 7.0 06/24/2022   HGB 15.2 06/24/2022   HCT 47.1 (H) 06/24/2022   MCV 80 06/24/2022   PLT 220 06/24/2022   Lab Results  Component Value Date   NA 141 06/24/2022   K 4.0 06/24/2022   CO2 27 06/24/2022   GLUCOSE 94 06/24/2022   BUN 13 06/24/2022   CREATININE 0.83 06/24/2022   BILITOT 0.4 06/24/2022   ALKPHOS 110 06/24/2022   AST 24 06/24/2022   ALT 24 06/24/2022   PROT 6.6 06/24/2022   ALBUMIN 4.3 06/24/2022   CALCIUM 9.5 06/24/2022   ANIONGAP 6 11/27/2020   EGFR 79 06/24/2022   GFR 75.19 01/25/2019   Lab Results  Component Value Date   CHOL 177 06/24/2022   Lab Results  Component Value Date   HDL 69 06/24/2022   Lab Results  Component Value Date   LDLCALC 89 06/24/2022   Lab Results  Component Value Date   TRIG 108 06/24/2022   Lab Results  Component Value Date   CHOLHDL 2.6 06/24/2022   Lab Results  Component Value Date   HGBA1C 6.3 (H) 06/24/2022      Assessment & Plan:   Problem List Items Addressed This Visit       Respiratory   Sleep apnea    Uses CPAP regularly, continues to benefit from it        Endocrine   Type 2 diabetes mellitus with other specified complication ([place]) - Primary  Lab Results  Component Value Date   HGBA1C 6.3 (H) 06/24/2022   Diet controlled Associated with HLD, GERD and GAD Advised to follow diabetic diet On statin F/u CMP and lipid panel Diabetic eye exam: Advised to follow up with Ophthalmology for diabetic eye exam       Relevant Medications   rosuvastatin (CRESTOR) 10 MG tablet   Other Relevant Orders   CMP14+EGFR   Hemoglobin A1c     Other   HLD (hyperlipidemia)    Lipid profile reviewed - improved now On Crestor now      Relevant Medications   rosuvastatin (CRESTOR) 10 MG tablet   Other Relevant Orders   Lipid Profile   Anxiety    Well-controlled with Zoloft      Erythrocytosis     Hb has been around 15, but had been doing blood donations as advised in the past by previous providers Hb has been up to 17 in the past Referred to Heme/Onc. for further evaluation as she has had unprovoked VTE in the past as well - visit notes reviewed Likely due to OSA      Other Visit Diagnoses     Screen for colon cancer       Relevant Orders   Ambulatory referral to Gastroenterology   Need for immunization against influenza       Relevant Orders   Flu Vaccine QUAD 68moIM (Fluarix, Fluzone & Alfiuria Quad PF) (Completed)       Meds ordered this encounter  Medications   rosuvastatin (CRESTOR) 10 MG tablet    Sig: Take 1 tablet (10 mg total) by mouth daily.    Dispense:  90 tablet    Refill:  3    Follow-up: Return in about 6 months (around 12/28/2022) for Annual physical.    RLindell Spar MD

## 2022-06-29 NOTE — Assessment & Plan Note (Signed)
Well-controlled with Zoloft °

## 2022-06-29 NOTE — Assessment & Plan Note (Signed)
Uses CPAP regularly, continues to benefit from it 

## 2022-06-29 NOTE — Assessment & Plan Note (Signed)
Hb has been around 15, but had been doing blood donations as advised in the past by previous providers Hb has been up to 17 in the past Referred to Heme/Onc. for further evaluation as she has had unprovoked VTE in the past as well - visit notes reviewed Likely due to OSA

## 2022-06-29 NOTE — Assessment & Plan Note (Signed)
Lipid profile reviewed - improved now On Crestor now

## 2022-07-01 DIAGNOSIS — H43812 Vitreous degeneration, left eye: Secondary | ICD-10-CM | POA: Diagnosis not present

## 2022-07-21 ENCOUNTER — Encounter: Payer: Self-pay | Admitting: Gastroenterology

## 2022-07-30 IMAGING — CT CT ANGIO CHEST
2 of 6 series · 18 of 46 positions shown · IV contrast (Omnipaque or Isovue)
Comparison: Chest radiograph dated 11/27/2020 and CT dated
03/30/2015.

CLINICAL DATA: 64-year-old female with concern for pulmonary
embolism.

EXAM:
CT ANGIOGRAPHY CHEST WITH CONTRAST
TECHNIQUE: Multidetector CT imaging of the chest was performed using the
standard protocol during bolus administration of intravenous
contrast. Multiplanar CT image reconstructions and MIPs were
obtained to evaluate the vascular anatomy.
CONTRAST:  75mL OMNIPAQUE IOHEXOL 350 MG/ML SOLN

[Series 5: pe axial thins · axial · 0.64mm/px · z∈[-303,-76]mm · 15 of 249 slices shown]
[im 11/249  lung]
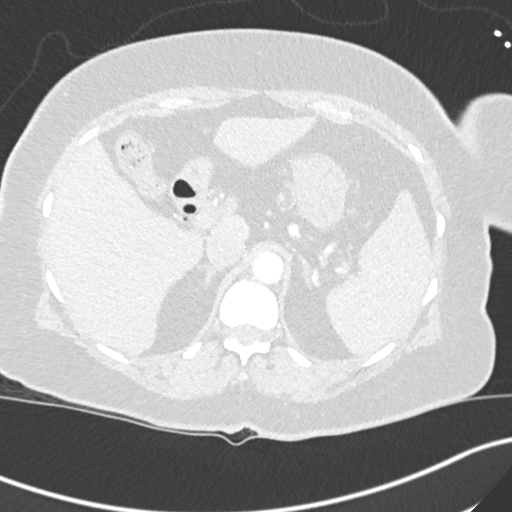
[im 33/249  soft-tissue]
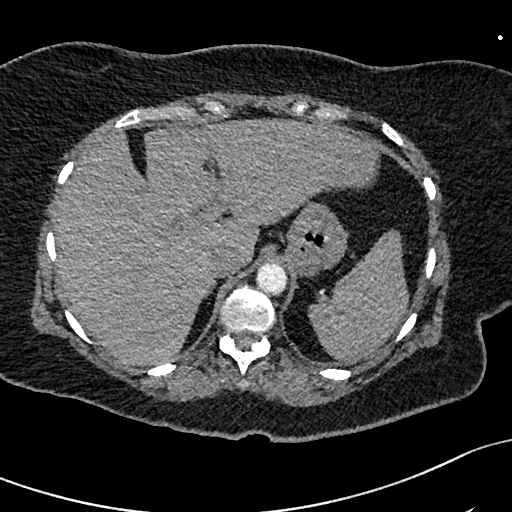
[im 44/249  lung]
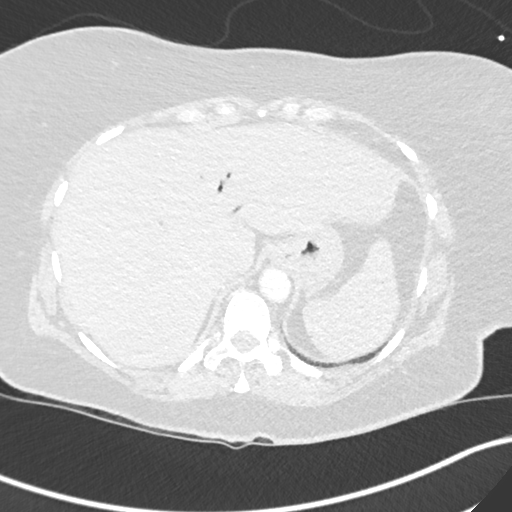
[im 65/249  soft-tissue]
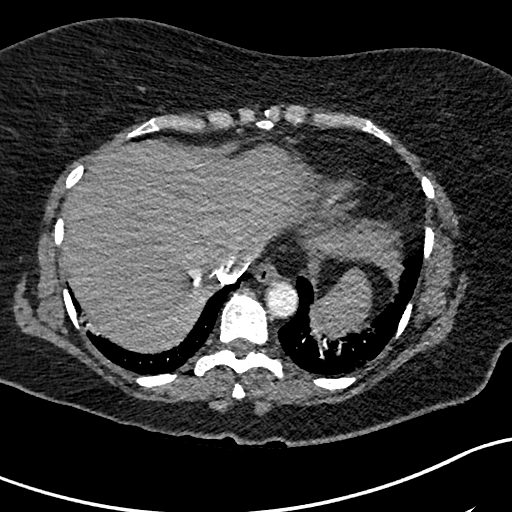
[im 76/249  lung]
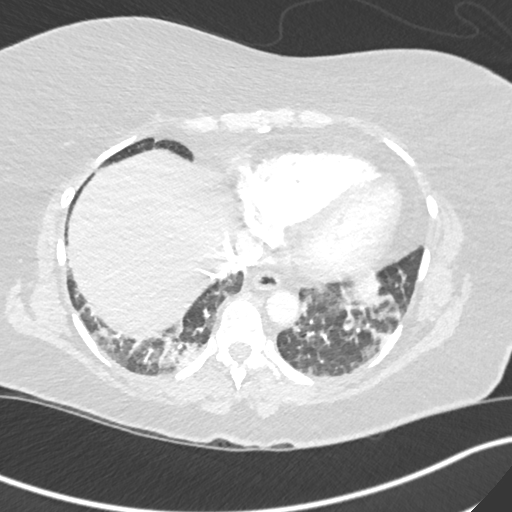
[im 98/249  soft-tissue]
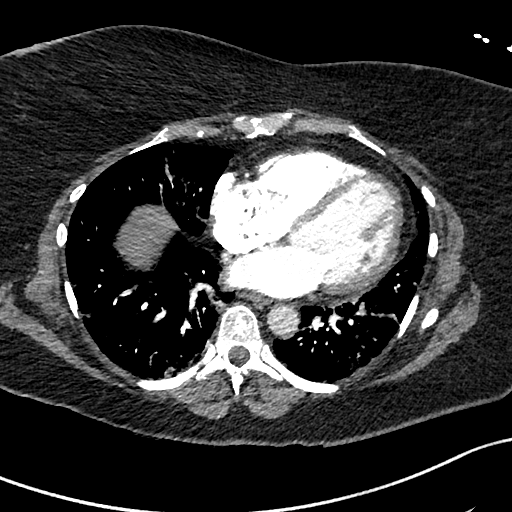
[im 108/249  lung]
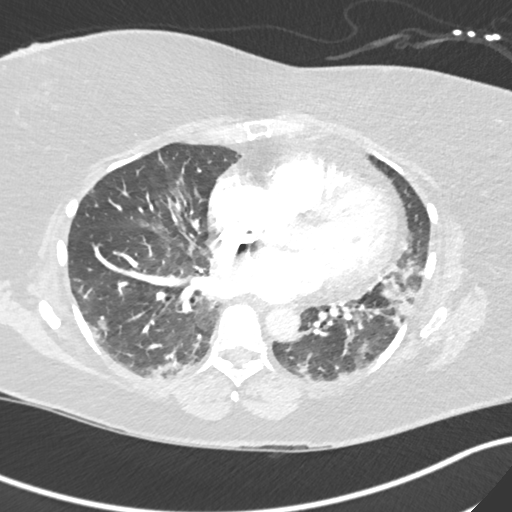
[im 130/249  soft-tissue]
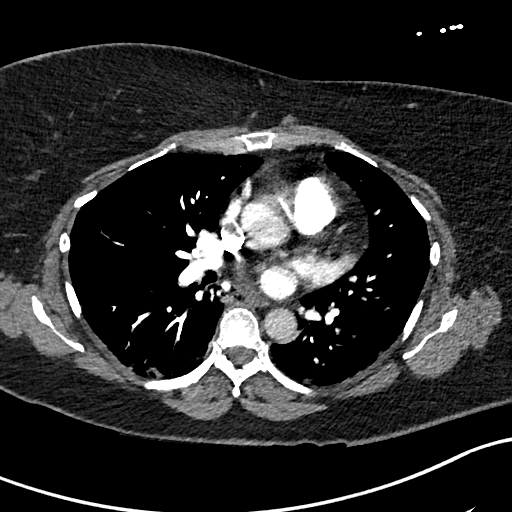
[im 141/249  lung]
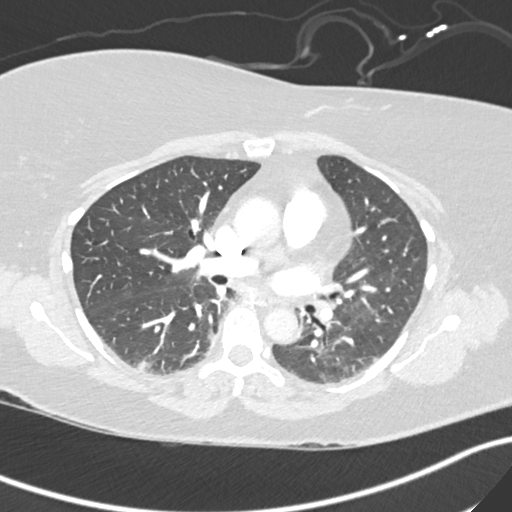
[im 151/249  soft-tissue]
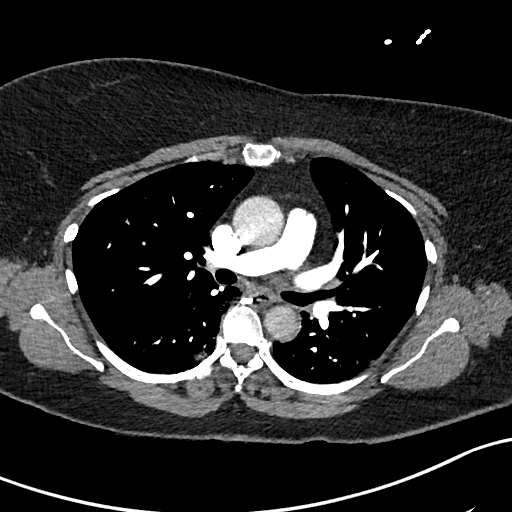
[im 173/249  lung]
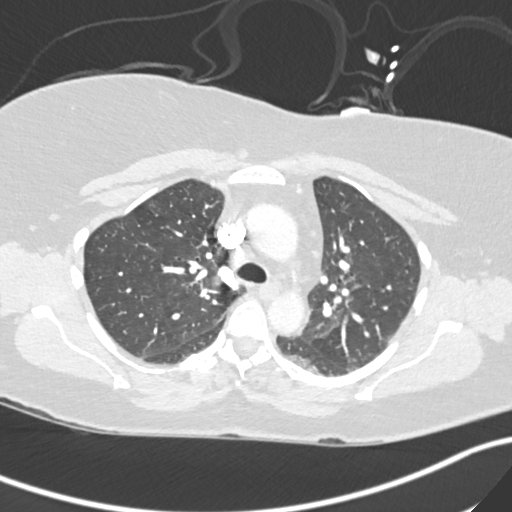
[im 184/249  soft-tissue]
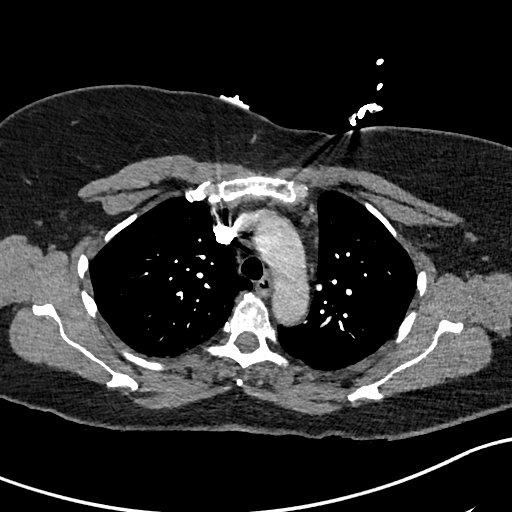
[im 205/249  lung]
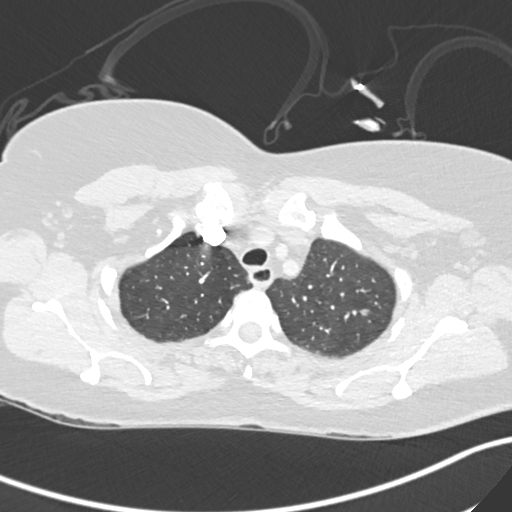
[im 216/249  soft-tissue]
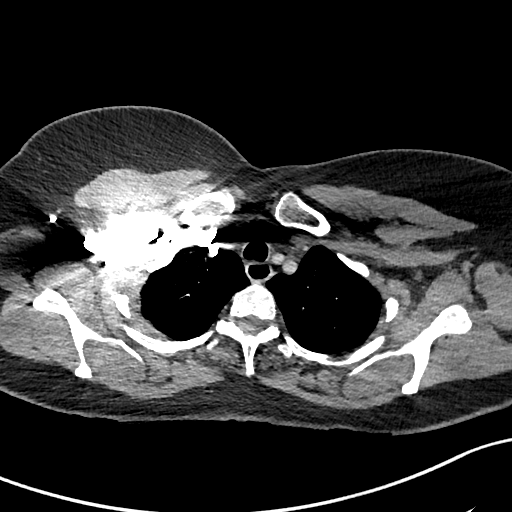
[im 238/249  lung]
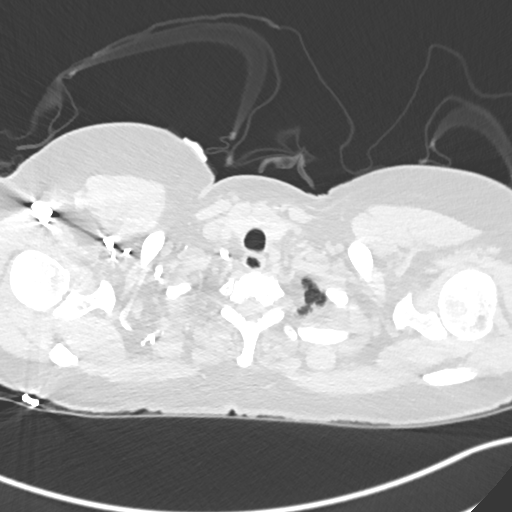

[Series 8: cor soft · coronal · 0.49mm/px · 3 of 129 slices shown]
[im 33/129  soft-tissue]
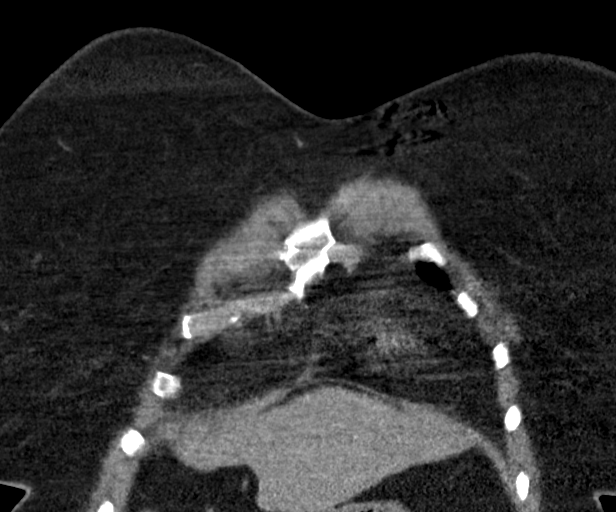
[im 65/129  soft-tissue]
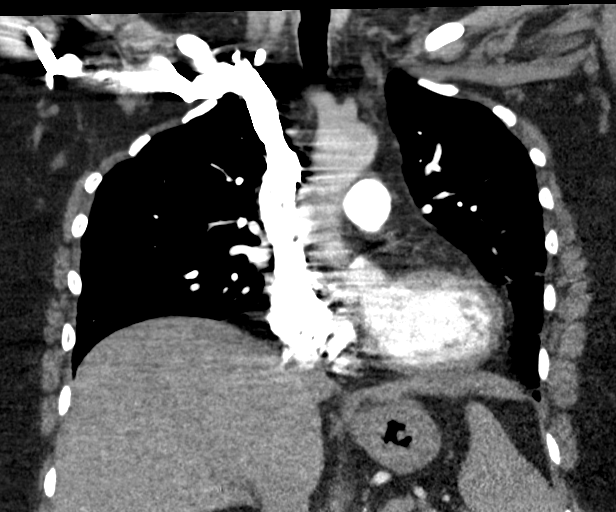
[im 97/129  soft-tissue]
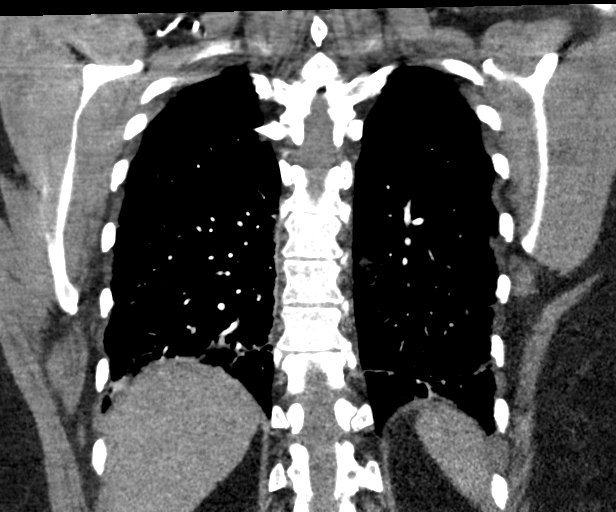

[18 of 46 positions shown; findings below may reference images not displayed]

FINDINGS: Cardiovascular: There is no cardiomegaly or pericardial effusion.
The thoracic aorta is unremarkable for the degree of opacification.
Evaluation of the pulmonary arteries is somewhat limited due to
respiratory motion artifact. No pulmonary artery embolus identified.

Mediastinum/Nodes: No hilar or mediastinal adenopathy. The esophagus
is grossly unremarkable. No mediastinal fluid collection.

Lungs/Pleura: Bilateral streaky and ground-glass densities primarily
involving the lower lobes most consistent with multifocal pneumonia,
likely viral or atypical in etiology including VR1AV-XH. Clinical
correlation is recommended. There is no pleural effusion
pneumothorax. The central airways are patent.

Upper Abdomen: Cholecystectomy.  Pneumobilia.

Musculoskeletal: No chest wall abnormality. No acute or significant
osseous findings.

Review of the MIP images confirms the above findings.
IMPRESSION: 1. No CT evidence of pulmonary embolism.
2. Multilobar pneumonia, likely viral or atypical in etiology
including VR1AV-XH. Clinical correlation is recommended.

## 2022-08-10 ENCOUNTER — Ambulatory Visit (AMBULATORY_SURGERY_CENTER): Payer: Self-pay

## 2022-08-10 VITALS — Ht 64.0 in | Wt 162.0 lb

## 2022-08-10 DIAGNOSIS — Z1211 Encounter for screening for malignant neoplasm of colon: Secondary | ICD-10-CM

## 2022-08-10 MED ORDER — NA SULFATE-K SULFATE-MG SULF 17.5-3.13-1.6 GM/177ML PO SOLN: 1.0000 | Freq: Once | ORAL | 0 refills | Status: AC

## 2022-08-10 NOTE — Progress Notes (Signed)

## 2022-08-20 ENCOUNTER — Encounter (INDEPENDENT_AMBULATORY_CARE_PROVIDER_SITE_OTHER): Payer: Self-pay | Admitting: Gastroenterology

## 2022-08-30 ENCOUNTER — Encounter: Payer: Self-pay | Admitting: Gastroenterology

## 2022-09-03 ENCOUNTER — Encounter: Payer: Self-pay | Admitting: Certified Registered Nurse Anesthetist

## 2022-09-06 ENCOUNTER — Encounter: Payer: Self-pay | Admitting: Gastroenterology

## 2022-09-06 ENCOUNTER — Ambulatory Visit (AMBULATORY_SURGERY_CENTER): Payer: BC Managed Care – PPO | Admitting: Gastroenterology

## 2022-09-06 VITALS — BP 96/49 | HR 55 | Temp 96.6°F | Resp 13 | Ht 64.0 in | Wt 162.0 lb

## 2022-09-06 DIAGNOSIS — Z1211 Encounter for screening for malignant neoplasm of colon: Secondary | ICD-10-CM

## 2022-09-06 MED ORDER — SODIUM CHLORIDE 0.9 % IV SOLN: 500.0000 mL | Freq: Once | INTRAVENOUS | Status: DC

## 2022-09-06 NOTE — Patient Instructions (Addendum)
- The patient will be observed post-procedure, until all discharge criteria are met. - Discharge patient to home. - Patient has a contact number available for emergencies. The signs and symptoms of potential delayed complications were discussed with the patient. Return to normal activities tomorrow. Written discharge instructions were provided to the patient. - High fiber diet. - Use FiberCon 1-2 tablets PO daily. - Continue present medications. - Repeat colonoscopy in 10 years for screening purposes. - The findings and recommendations were discussed with the patient. - The findings and recommendations were discussed with the patient's family.  YOU HAD AN ENDOSCOPIC PROCEDURE TODAY AT [facility]:   Refer to the procedure report that was given to you for any specific questions about what was found during the examination.  If the procedure report does not answer your questions, please call your gastroenterologist to clarify.  If you requested that your care partner not be given the details of your procedure findings, then the procedure report has been included in a sealed envelope for you to review at your convenience later.  YOU SHOULD EXPECT: Some feelings of bloating in the abdomen. Passage of more gas than usual.  Walking can help get rid of the air that was put into your GI tract during the procedure and reduce the bloating. If you had a lower endoscopy (such as a colonoscopy or flexible sigmoidoscopy) you may notice spotting of blood in your stool or on the toilet paper. If you underwent a bowel prep for your procedure, you may not have a normal bowel movement for a few days.  Please Note:  You might notice some irritation and congestion in your nose or some drainage.  This is from the oxygen used during your procedure.  There is no need for concern and it should clear up in a day or so.  SYMPTOMS TO REPORT IMMEDIATELY:  Following lower endoscopy (colonoscopy or flexible  sigmoidoscopy):  Excessive amounts of blood in the stool  Significant tenderness or worsening of abdominal pains  Swelling of the abdomen that is new, acute  Fever of 100F or higher  For urgent or emergent issues, a gastroenterologist can be reached at any hour by calling [phone]. Do not use MyChart messaging for urgent concerns.    DIET:  We do recommend a small meal at first, but then you may proceed to your regular diet.  Drink plenty of fluids but you should avoid alcoholic beverages for 24 hours.  ACTIVITY:  You should plan to take it easy for the rest of today and you should NOT DRIVE or use heavy machinery until tomorrow (because of the sedation medicines used during the test).    FOLLOW UP: Our staff will call the number listed on your records the next business day following your procedure.  We will call around 7:15- 8:00 am to check on you and address any questions or concerns that you may have regarding the information given to you following your procedure. If we do not reach you, we will leave a message.     If any biopsies were taken you will be contacted by phone or by letter within the next 1-3 weeks.  Please call us at [phone] if you have not heard about the biopsies in 3 weeks.    SIGNATURES/CONFIDENTIALITY: You and/or your care partner have signed paperwork which will be entered into your electronic medical record.  These signatures attest to the fact that that the information above on your After  Visit Summary has been reviewed and is understood.  Full responsibility of the confidentiality of this discharge information lies with you and/or your care-partner.

## 2022-09-06 NOTE — Op Note (Signed)
[facility] Patient Name: Spruha Weight Procedure Date: 09/06/2022 10:33 AM MRN: [id number] Endoscopist: Justice Britain , MD, [id number] Age: 66 Referring MD:  Date of Birth: 1956-03-04 Gender: Female Account #: [id number] Procedure:                Colonoscopy Indications:              Screening for colorectal malignant neoplasm Medicines:                Monitored Anesthesia Care Procedure:                Pre-Anesthesia Assessment:                           - Prior to the procedure, a History and Physical                            was performed, and patient medications and                            allergies were reviewed. The patient's tolerance of                            previous anesthesia was also reviewed. The risks                            and benefits of the procedure and the sedation                            options and risks were discussed with the patient.                            All questions were answered, and informed consent                            was obtained. Prior Anticoagulants: The patient has                            taken no anticoagulant or antiplatelet agents                            except for aspirin. ASA Grade Assessment: II - A                            patient with mild systemic disease. After reviewing                            the risks and benefits, the patient was deemed in                            satisfactory condition to undergo the procedure.                           After obtaining informed consent, the colonoscope  was passed under direct vision. Throughout the                            procedure, the patient's blood pressure, pulse, and                            oxygen saturations were monitored continuously. The                            Olympus CF-HQ190L ([id number]) Colonoscope was                            introduced through the anus and advanced to the 5                             cm into the ileum. The colonoscopy was somewhat                            difficult due to significant looping. Successful                            completion of the procedure was aided by changing                            the patient's position, using manual pressure,                            straightening and shortening the scope to obtain                            bowel loop reduction and using scope torsion. The                            quality of the bowel preparation was good. The                            terminal ileum, ileocecal valve, appendiceal                            orifice, and rectum were photographed. Scope In: 10:53:31 AM Scope Out: 11:05:19 AM Scope Withdrawal Time: 0 hours 7 minutes 12 seconds  Total Procedure Duration: 0 hours 11 minutes 48 seconds  Findings:                 The colon (entire examined portion) revealed                            significantly excessive looping.                           The terminal ileum and ileocecal valve appeared                            normal.  Multiple small-mouthed diverticula were found in                            the recto-sigmoid colon and sigmoid colon.                           Normal mucosa was found in the entire colon                            otherwise.                           Non-bleeding non-thrombosed internal hemorrhoids                            were found during retroflexion, during perianal                            exam and during digital exam. The hemorrhoids were                            Grade II (internal hemorrhoids that prolapse but                            reduce spontaneously). Complications:            No immediate complications. Estimated Blood Loss:     Estimated blood loss: none. Impression:               - There was significant looping of the colon.                           - The examined portion of the ileum was normal.                            - Diverticulosis in the recto-sigmoid colon and in                            the sigmoid colon.                           - Normal mucosa in the entire examined colon                            otherwise.                           - Non-bleeding non-thrombosed internal hemorrhoids. Recommendation:           - The patient will be observed post-procedure,                            until all discharge criteria are met.                           - Discharge patient to home.                           -  Patient has a contact number available for                            emergencies. The signs and symptoms of potential                            delayed complications were discussed with the                            patient. Return to normal activities tomorrow.                            Written discharge instructions were provided to the                            patient.                           - High fiber diet.                           - Use FiberCon 1-2 tablets PO daily.                           - Continue present medications.                           - Repeat colonoscopy in 10 years for screening                            purposes.                           - The findings and recommendations were discussed                            with the patient.                           - The findings and recommendations were discussed                            with the patient's family. Justice Britain, MD 09/06/2022 11:11:27 AM

## 2022-09-06 NOTE — Progress Notes (Signed)
Pt's states no medical or surgical changes since previsit or office visit. 

## 2022-09-06 NOTE — Progress Notes (Signed)
Report given to PACU, vss 

## 2022-09-06 NOTE — Progress Notes (Signed)
GASTROENTEROLOGY PROCEDURE H&P NOTE   Primary Care Physician: Lindell Spar, MD  HPI: Mallory [name] is a 66 y.o. female who presents for screening colonoscopy.  Past Medical History:  Diagnosis Date   GERD (gastroesophageal reflux disease)    HLD (hyperlipidemia)    IBS (irritable bowel syndrome)    Osteoarthritis of knee    Right; had right TKR   Pulmonary embolism (HCC)    5 yrs ago (unknown region)   Sleep apnea    Past Surgical History:  Procedure Laterality Date   CESAREAN SECTION  1991   [facility]   CESAREAN SECTION N/A    Phreesia 01/16/2021   CHOLECYSTECTOMY  15 yrs ago   [facility]   COLONOSCOPY  05/24/2012   Procedure: COLONOSCOPY;  Surgeon: Rogene Houston, MD;  [facility];  Service: Endoscopy;  Laterality: N/A;  220   ERCP  08/12/2011   Procedure: ENDOSCOPIC RETROGRADE CHOLANGIOPANCREATOGRAPHY (ERCP);  Surgeon: Rogene Houston, MD;  Location: [facility];  Service: Endoscopy;  Laterality: N/A;   ERCP W/ SPHICTEROTOMY  08/12/11   Dr Rehman-?microlithiasis, SOD   ESOPHAGOGASTRODUODENOSCOPY  11/25/2011   Procedure: ESOPHAGOGASTRODUODENOSCOPY (EGD);  Surgeon: Rogene Houston, MD;  [facility];  Service: Endoscopy;  Laterality: N/A;  830   EUS  01/19/2012   Procedure: UPPER ENDOSCOPIC ULTRASOUND (EUS) LINEAR;  Surgeon: Milus Banister, MD;  [facility];  Service: Endoscopy;  Laterality: N/A;  pt moved up a hour early by AW , Patty @ [facility] office ok'd / pt was called by Victor 01/16/2021   SPHINCTEROTOMY  08/12/2011   Procedure: Joan Mayans;  Surgeon: Rogene Houston, MD;  Location: [facility];  Service: Endoscopy;;  with ballon passage   TOTAL KNEE ARTHROPLASTY Right 09/13/2017   TOTAL KNEE ARTHROPLASTY Right 09/13/2017   Procedure: RIGHT TOTAL KNEE ARTHROPLASTY;  Surgeon: Mcarthur Rossetti, MD;  Location: [place];  Service: Orthopedics;  Laterality: Right;   TUBAL LIGATION     Current Outpatient Medications   Medication Sig Dispense Refill   aspirin EC 81 MG tablet Take 81 mg by mouth every other day. Swallow whole.     blood glucose meter kit and supplies Dispense based on patient and insurance preference. Use up to four times daily as directed. (FOR ICD-10 E10.9, E11.9). 1 each 0   cholecalciferol (VITAMIN D3) 25 MCG (1000 UNIT) tablet Take 1,000 Units by mouth daily.     glucose blood (CONTOUR NEXT TEST) test strip USE TO CHECK BLOOD SUGAR FOUR TIMES DAILY AS DIRECTED 100 strip 0   hyoscyamine (LEVSIN SL) 0.125 MG SL tablet TAKE 1 OR 2 TABLETS BY MOUTH EVERY 6 HOURS AS NEEDED 50 tablet 0   ibuprofen (ADVIL,MOTRIN) 200 MG tablet Take 400 mg by mouth daily as needed for mild pain or moderate pain. Pain (Patient not taking: Reported on 08/10/2022)     Microlet Lancets MISC USE TO CHECK BLOOD SUGAR FOUR TIMES DAILY AS DIRECTED 100 each 0   pantoprazole (PROTONIX) 40 MG tablet TAKE (1) TABLET BY MOUTH DAILY SHORTLY BEFORE BREAKFAST MEAL EACH DAY 30 tablet 6   rosuvastatin (CRESTOR) 10 MG tablet Take 1 tablet (10 mg total) by mouth daily. 90 tablet 3   sertraline (ZOLOFT) 50 MG tablet TAKE ONE TABLET BY MOUTH ONCE DAILY. 90 tablet 1   UNABLE TO FIND Home Sleep Study Kit - use as directed to detect sleep apnea Dx: D75.1; R06.83 1  each 0   No current facility-administered medications for this visit.    Current Outpatient Medications:    aspirin EC 81 MG tablet, Take 81 mg by mouth every other day. Swallow whole., Disp: , Rfl:    blood glucose meter kit and supplies, Dispense based on patient and insurance preference. Use up to four times daily as directed. (FOR ICD-10 E10.9, E11.9)., Disp: 1 each, Rfl: 0   cholecalciferol (VITAMIN D3) 25 MCG (1000 UNIT) tablet, Take 1,000 Units by mouth daily., Disp: , Rfl:    glucose blood (CONTOUR NEXT TEST) test strip, USE TO CHECK BLOOD SUGAR FOUR TIMES DAILY AS DIRECTED, Disp: 100 strip, Rfl: 0   hyoscyamine (LEVSIN SL) 0.125 MG SL tablet, TAKE 1 OR 2 TABLETS BY  MOUTH EVERY 6 HOURS AS NEEDED, Disp: 50 tablet, Rfl: 0   ibuprofen (ADVIL,MOTRIN) 200 MG tablet, Take 400 mg by mouth daily as needed for mild pain or moderate pain. Pain (Patient not taking: Reported on 08/10/2022), Disp: , Rfl:    Microlet Lancets MISC, USE TO CHECK BLOOD SUGAR FOUR TIMES DAILY AS DIRECTED, Disp: 100 each, Rfl: 0   pantoprazole (PROTONIX) 40 MG tablet, TAKE (1) TABLET BY MOUTH DAILY SHORTLY BEFORE BREAKFAST MEAL EACH DAY, Disp: 30 tablet, Rfl: 6   rosuvastatin (CRESTOR) 10 MG tablet, Take 1 tablet (10 mg total) by mouth daily., Disp: 90 tablet, Rfl: 3   sertraline (ZOLOFT) 50 MG tablet, TAKE ONE TABLET BY MOUTH ONCE DAILY., Disp: 90 tablet, Rfl: 1   UNABLE TO FIND, Home Sleep Study Kit - use as directed to detect sleep apnea Dx: D75.1; R06.83, Disp: 1 each, Rfl: 0 Allergies  Allergen Reactions   Promethazine Hcl Hives   Phenytoin Itching   Hydromorphone Itching   Macrolides And Ketolides Itching   Nitrofurantoin Monohyd Macro Itching   Family History  Problem Relation Age of Onset   Diabetes Mother    Heart failure Father    Heart disease Sister    Diabetes Brother    Heart disease Maternal Grandmother    Cancer Maternal Grandfather        ? pancreatic or lung   Anesthesia problems Neg Hx    Hypotension Neg Hx    Malignant hyperthermia Neg Hx    Pseudochol deficiency Neg Hx    Stroke Neg Hx    Colon cancer Neg Hx    Colon polyps Neg Hx    Esophageal cancer Neg Hx    Rectal cancer Neg Hx    Stomach cancer Neg Hx    Social History   Socioeconomic History   Marital status: Married    Spouse name: Not on file   Number of children: 2   Years of education: Not on file   Highest education level: Not on file  Occupational History   Occupation: [employer]    Comment: Medical sales representative  Tobacco Use   Smoking status: Never   Smokeless tobacco: Never  Vaping Use   Vaping Use: Never used  Substance and Sexual Activity   Alcohol use: Yes    Alcohol/week: 0.0 standard  drinks of alcohol    Comment: occasional wine or moonshine, but only on vacation   Drug use: No   Sexual activity: Yes    Birth control/protection: Post-menopausal  Other Topics Concern   Not on file  Social History Narrative    2 daughters      Social Determinants of Health   Financial Resource Strain: Not on file  Food Insecurity: Not  on file  Transportation Needs: Not on file  Physical Activity: Not on file  Stress: Not on file  Social Connections: Not on file  Intimate Partner Violence: Not on file    Physical Exam: There were no vitals filed for this visit. There is no height or weight on file to calculate BMI. GEN: NAD EYE: Sclerae anicteric ENT: MMM CV: Non-tachycardic GI: Soft, NT/ND NEURO:  Alert & Oriented x 3  Lab Results: No results for input(s): "WBC", "HGB", "HCT", "PLT" in the last 72 hours. BMET No results for input(s): "NA", "K", "CL", "CO2", "GLUCOSE", "BUN", "CREATININE", "CALCIUM" in the last 72 hours. LFT No results for input(s): "PROT", "ALBUMIN", "AST", "ALT", "ALKPHOS", "BILITOT", "BILIDIR", "IBILI" in the last 72 hours. PT/INR No results for input(s): "LABPROT", "INR" in the last 72 hours.   Impression / Plan: This is a 66 y.o.female who presents for screening colonoscopy.  The risks and benefits of endoscopic evaluation/treatment were discussed with the patient and/or family; these include but are not limited to the risk of perforation, infection, bleeding, missed lesions, lack of diagnosis, severe illness requiring hospitalization, as well as anesthesia and sedation related illnesses.  The patient's history has been reviewed, patient examined, no change in status, and deemed stable for procedure.  The patient and/or family is agreeable to proceed.    Justice Britain, MD [facility] Office # [phone]

## 2022-09-07 ENCOUNTER — Telehealth: Payer: Self-pay

## 2022-09-07 NOTE — Telephone Encounter (Signed)
  Follow up Call-     09/06/2022   10:26 AM  Call back number  Post procedure Call Back phone  # [phone]  Permission to leave phone message Yes     Patient questions:  Do you have a fever, pain , or abdominal swelling? No. Pain Score  0 *  Have you tolerated food without any problems? Yes.    Have you been able to return to your normal activities? Yes.    Do you have any questions about your discharge instructions: Diet   No. Medications  No. Follow up visit  No.  Do you have questions or concerns about your Care? No.  Actions: * If pain score is 4 or above: No action needed, pain <4.

## 2022-09-22 ENCOUNTER — Other Ambulatory Visit: Payer: BC Managed Care – PPO

## 2022-09-22 ENCOUNTER — Ambulatory Visit: Payer: BC Managed Care – PPO | Admitting: Physician Assistant

## 2022-09-23 LAB — HM DIABETES EYE EXAM

## 2022-09-28 DIAGNOSIS — Z6828 Body mass index (BMI) 28.0-28.9, adult: Secondary | ICD-10-CM | POA: Diagnosis not present

## 2022-09-28 DIAGNOSIS — Z124 Encounter for screening for malignant neoplasm of cervix: Secondary | ICD-10-CM | POA: Diagnosis not present

## 2022-09-28 DIAGNOSIS — Z1231 Encounter for screening mammogram for malignant neoplasm of breast: Secondary | ICD-10-CM | POA: Diagnosis not present

## 2022-09-28 DIAGNOSIS — Z01419 Encounter for gynecological examination (general) (routine) without abnormal findings: Secondary | ICD-10-CM | POA: Diagnosis not present

## 2022-09-28 LAB — HM MAMMOGRAPHY

## 2022-10-11 NOTE — Progress Notes (Signed)
[facility] [address], [place] [address]   CLINIC:  Medical Oncology/Hematology  PCP:  Anabel Halon, MD [address] [place] [address] [phone]   REASON FOR VISIT:  Follow-up for erythrocytosis  PRIOR THERAPY: None  CURRENT THERAPY: Active surveillance  INTERVAL HISTORY:  Mallory [name] 67 y.o. female returns for routine follow-up of erythrocytosis.  She was last seen by Rojelio Brenner PA-C on 03/25/2022.  She reports that she is feeling well.  She denies any hospitalizations, surgeries, or major changes in health status since her last visit.  She continues to use CPAP nightly.  She has not had any thrombotic events since her last visit, but has history of unprovoked PE in 2007 that was treated with warfarin x 1 year.  She has intermittent "floaters" in her left eye accompanied by occasional headaches. She denies any itching after showers or changing in the colors of her fingers.  No erythromelalgia. She denies any fevers, night sweats, or unintentional weight loss.  She denies any peripheral edema or hematuria.  She has lost weight intentionally with keto diet over the past 6 months.  She has 85% energy and 100% appetite. She endorses that she is maintaining a stable weight.   REVIEW OF SYSTEMS: No complaints at today's visit Review of Systems  Constitutional:  Negative for appetite change, chills, diaphoresis, fatigue, fever and unexpected weight change.  HENT:   Negative for lump/mass and nosebleeds.   Eyes:  Negative for eye problems.  Respiratory:  Negative for cough, hemoptysis and shortness of breath.   Cardiovascular:  Negative for chest pain, leg swelling and palpitations.  Gastrointestinal:  Negative for abdominal pain, blood in stool, constipation, diarrhea, nausea and vomiting.  Genitourinary:  Negative for hematuria.   Skin: Negative.   Neurological:  Negative for dizziness, headaches and light-headedness.  Hematological:   Does not bruise/bleed easily.      PAST MEDICAL/SURGICAL HISTORY:  Past Medical History:  Diagnosis Date   GERD (gastroesophageal reflux disease)    HLD (hyperlipidemia)    IBS (irritable bowel syndrome)    Osteoarthritis of knee    Right; had right TKR   Pulmonary embolism (HCC)    5 yrs ago (unknown region)   Sleep apnea    Past Surgical History:  Procedure Laterality Date   CESAREAN SECTION  1991   mc   CESAREAN SECTION N/A    Phreesia 01/16/2021   CHOLECYSTECTOMY  15 yrs ago   mc   COLONOSCOPY  05/24/2012   Procedure: COLONOSCOPY;  Surgeon: Malissa Hippo, MD;  [facility];  Service: Endoscopy;  Laterality: N/A;  220   ERCP  08/12/2011   Procedure: ENDOSCOPIC RETROGRADE CHOLANGIOPANCREATOGRAPHY (ERCP);  Surgeon: Malissa Hippo, MD;  [facility];  Service: Endoscopy;  Laterality: N/A;   ERCP W/ SPHICTEROTOMY  08/12/11   Dr Rehman-?microlithiasis, SOD   ESOPHAGOGASTRODUODENOSCOPY  11/25/2011   Procedure: ESOPHAGOGASTRODUODENOSCOPY (EGD);  Surgeon: Malissa Hippo, MD;  [facility];  Service: Endoscopy;  Laterality: N/A;  830   EUS  01/19/2012   Procedure: UPPER ENDOSCOPIC ULTRASOUND (EUS) LINEAR;  Surgeon: Rachael Fee, MD;  [facility];  Service: Endoscopy;  Laterality: N/A;  pt moved up a hour early by AW , Patty @ [facility] office ok'd / pt was called by AW   JOINT REPLACEMENT N/A    Phreesia 01/16/2021   SPHINCTEROTOMY  08/12/2011   Procedure: Dennison Mascot;  Surgeon: Malissa Hippo, MD;  [facility];  Service: Endoscopy;;  with ballon passage   TOTAL KNEE ARTHROPLASTY Right 09/13/2017   TOTAL KNEE ARTHROPLASTY Right 09/13/2017   Procedure: RIGHT TOTAL KNEE ARTHROPLASTY;  Surgeon: Kathryne Hitch, MD;  [facility];  Service: Orthopedics;  Laterality: Right;   TUBAL LIGATION       SOCIAL HISTORY:  Social History   Socioeconomic History   Marital status: Married    Spouse name: Not on file   Number of  children: 2   Years of education: Not on file   Highest education level: Not on file  Occupational History   Occupation: [employer]    Comment: Careers information officer  Tobacco Use   Smoking status: Never   Smokeless tobacco: Never  Vaping Use   Vaping Use: Never used  Substance and Sexual Activity   Alcohol use: Yes    Alcohol/week: 0.0 standard drinks of alcohol    Comment: occasional wine or moonshine, but only on vacation   Drug use: No   Sexual activity: Yes    Birth control/protection: Post-menopausal  Other Topics Concern   Not on file  Social History Narrative    2 daughters      Social Determinants of Health   Financial Resource Strain: Not on file  Food Insecurity: Not on file  Transportation Needs: Not on file  Physical Activity: Not on file  Stress: Not on file  Social Connections: Not on file  Intimate Partner Violence: Not on file    FAMILY HISTORY:  Family History  Problem Relation Age of Onset   Diabetes Mother    Heart failure Father    Heart disease Sister    Diabetes Brother    Heart disease Maternal Grandmother    Cancer Maternal Grandfather        ? pancreatic or lung   Anesthesia problems Neg Hx    Hypotension Neg Hx    Malignant hyperthermia Neg Hx    Pseudochol deficiency Neg Hx    Stroke Neg Hx    Colon cancer Neg Hx    Colon polyps Neg Hx    Esophageal cancer Neg Hx    Rectal cancer Neg Hx    Stomach cancer Neg Hx     CURRENT MEDICATIONS:  Outpatient Encounter Medications as of 10/12/2022  Medication Sig Note   aspirin EC 81 MG tablet Take 81 mg by mouth every other day. Swallow whole.    blood glucose meter kit and supplies Dispense based on patient and insurance preference. Use up to four times daily as directed. (FOR ICD-10 E10.9, E11.9).    cholecalciferol (VITAMIN D3) 25 MCG (1000 UNIT) tablet Take 1,000 Units by mouth daily.    glucose blood (CONTOUR NEXT TEST) test strip USE TO CHECK BLOOD SUGAR FOUR TIMES DAILY AS DIRECTED     hyoscyamine (LEVSIN SL) 0.125 MG SL tablet TAKE 1 OR 2 TABLETS BY MOUTH EVERY 6 HOURS AS NEEDED    ibuprofen (ADVIL,MOTRIN) 200 MG tablet Take 400 mg by mouth daily as needed for mild pain or moderate pain. Pain (Patient not taking: Reported on 08/10/2022) 09/21/2017: States not taking due to other meds, MD aware.    Microlet Lancets MISC USE TO CHECK BLOOD SUGAR FOUR TIMES DAILY AS DIRECTED    pantoprazole (PROTONIX) 40 MG tablet TAKE (1) TABLET BY MOUTH DAILY SHORTLY BEFORE BREAKFAST MEAL EACH DAY    rosuvastatin (CRESTOR) 10 MG tablet Take 1 tablet (10 mg total) by mouth daily.    sertraline (ZOLOFT)  50 MG tablet TAKE ONE TABLET BY MOUTH ONCE DAILY.    UNABLE TO FIND Home Sleep Study Kit - use as directed to detect sleep apnea Dx: D75.1; R06.83    No facility-administered encounter medications on file as of 10/12/2022.    ALLERGIES:  Allergies  Allergen Reactions   Promethazine Hcl Hives   Phenytoin Itching   Hydromorphone Itching   Macrolides And Ketolides Itching   Nitrofurantoin Monohyd Macro Itching     PHYSICAL EXAM:  ECOG PERFORMANCE STATUS: 0 - Asymptomatic  There were no vitals filed for this visit. There were no vitals filed for this visit. Physical Exam Constitutional:      Appearance: Normal appearance.  HENT:     Head: Normocephalic and atraumatic.     Mouth/Throat:     Mouth: Mucous membranes are moist.  Eyes:     Extraocular Movements: Extraocular movements intact.     Pupils: Pupils are equal, round, and reactive to light.  Cardiovascular:     Rate and Rhythm: Normal rate and regular rhythm.     Pulses: Normal pulses.     Heart sounds: Normal heart sounds.  Pulmonary:     Effort: Pulmonary effort is normal.     Breath sounds: Normal breath sounds.  Abdominal:     General: Bowel sounds are normal.     Palpations: Abdomen is soft.     Tenderness: There is no abdominal tenderness.  Musculoskeletal:        General: No swelling.     Right lower leg: No  edema.     Left lower leg: No edema.  Lymphadenopathy:     Cervical: No cervical adenopathy.  Skin:    General: Skin is warm and dry.  Neurological:     General: No focal deficit present.     Mental Status: She is alert and oriented to person, place, and time.  Psychiatric:        Mood and Affect: Mood normal.        Behavior: Behavior normal.      LABORATORY DATA:  I have reviewed the labs as listed.  CBC    Component Value Date/Time   WBC 7.0 06/24/2022 0809   WBC 7.1 03/04/2022 1034   RBC 5.89 (H) 06/24/2022 0809   RBC 5.34 (H) 03/04/2022 1034   HGB 15.2 06/24/2022 0809   HCT 47.1 (H) 06/24/2022 0809   PLT 220 06/24/2022 0809   MCV 80 06/24/2022 0809   MCH 25.8 (L) 06/24/2022 0809   MCH 27.0 03/04/2022 1034   MCHC 32.3 06/24/2022 0809   MCHC 32.3 03/04/2022 1034   RDW 14.3 06/24/2022 0809   LYMPHSABS 2.0 06/24/2022 0809   MONOABS 0.6 03/04/2022 1034   EOSABS 0.3 06/24/2022 0809   BASOSABS 0.0 06/24/2022 0809      Latest Ref Rng & Units 06/24/2022    8:09 AM 12/23/2021    8:08 AM 09/22/2021    8:04 AM  CMP  Glucose 70 - 99 mg/dL 94  89  90   BUN 8 - 27 mg/dL 13  18  15    Creatinine 0.57 - 1.00 mg/dL 9.52  8.41  3.24   Sodium 134 - 144 mmol/L 141  143  141   Potassium 3.5 - 5.2 mmol/L 4.0  4.2  4.1   Chloride 96 - 106 mmol/L 102  102  102   CO2 20 - 29 mmol/L 27  26  25    Calcium 8.7 - 10.3 mg/dL 9.5  9.5  9.6   Total Protein 6.0 - 8.5 g/dL 6.6  6.6  6.7   Total Bilirubin 0.0 - 1.2 mg/dL 0.4  0.4  0.3   Alkaline Phos 44 - 121 IU/L 110  103  121   AST 0 - 40 IU/L 24  20  18    ALT 0 - 32 IU/L 24  17  25      DIAGNOSTIC IMAGING:  I have independently reviewed the relevant imaging and discussed with the patient.  ASSESSMENT & PLAN: 1.  Erythrocytosis, secondary polycythemia from OSA - Seen at the request of Dr. Allena Katz for erythrocytosis. - She had elevated RBC count since 2012.  Elevated hemoglobin from 2014 through 2022 - Hgb peaked at 17.2 (06/15/2013), has  been normal since October 2022. -Most recent CBC (03/04/2022) normal Hgb 14.4, normal hematocrit 44.6, mildly elevated RBC 5.34 - She donated blood in 2022 and again in January 2023 - She is a lifelong non-smoker. - She has obstructive sleep apnea, uses CPAP regularly - was diagnosed in June 2022 - No COPD, lung disease, or known heart disease - No known carbon monoxide exposure. - History of unprovoked pulmonary embolism (03/20/2006), treated with 1 year of Coumadin. - No aquagenic pruritus/vasomotor symptoms.  She lost about 40 pounds 6 months ago switching to keto diet and exercise.  No B symptoms.  - Hematology work-up (03/04/2022): Normal mid range erythropoietin 11.2 JAK2 with reflex to CALR, MPL, and Exons 12-15 was negative - No evidence of myeloproliferative neoplasm or polycythemia vera. - Labs today (10/12/2022): Hgb 15.6/hematocrit 47.6%.  Otherwise normal CBC. -- She takes aspirin 81 mg every other day.  Additional cardiac risk factors include high cholesterol and pre-diabetes. - Differential diagnosis favors mild secondary polycythemia/erythrocytosis in the setting of obstructive sleep apnea.  Hemoglobin has improved since she started CPAP (June 2022) and after her blood donation in January 2023.   - PLAN:  No indication for treatment at this time.  Continue excellent use of CPAP.  We will repeat labs and discuss at follow up visit in 6 months. - Would consider therapeutic phlebotomy if severe vasomotor symptoms or HCT >54.0. - Continue aspirin 81 mg daily. - I have asked her to hold off on any blood donation for the next 6 months, to see if she has any worsening in her blood counts without phlebotomy.  2.  Social/family: - Lives with husband.  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  Non-smoker. - No family history of polycythemia vera.  Maternal grandfather had lung cancer.  3 maternal aunts had breast cancer. - She is up-to-date on mammograms, last one on  09/24/2021 negative.  She reports that she is due for colonoscopy this year.   PLAN SUMMARY: >> Labs in 6 months (CBC/D) >> PHONE visit after labs   All questions were answered. The patient knows to call the clinic with any problems, questions or concerns.  Medical decision making: Low  Time spent on visit: I spent 15 minutes counseling the patient face to face. The total time spent in the appointment was 25 minutes and more than 50% was on counseling.   Carnella Guadalajara, PA-C  10/12/2022 10:29 AM

## 2022-10-12 ENCOUNTER — Inpatient Hospital Stay: Payer: BC Managed Care – PPO | Attending: Physician Assistant

## 2022-10-12 ENCOUNTER — Inpatient Hospital Stay (HOSPITAL_BASED_OUTPATIENT_CLINIC_OR_DEPARTMENT_OTHER): Payer: BC Managed Care – PPO | Admitting: Physician Assistant

## 2022-10-12 ENCOUNTER — Other Ambulatory Visit: Payer: Self-pay

## 2022-10-12 VITALS — BP 115/79 | HR 60 | Temp 97.9°F | Resp 16 | Ht 64.0 in | Wt 163.8 lb

## 2022-10-12 DIAGNOSIS — D751 Secondary polycythemia: Secondary | ICD-10-CM

## 2022-10-12 DIAGNOSIS — Z7982 Long term (current) use of aspirin: Secondary | ICD-10-CM | POA: Insufficient documentation

## 2022-10-12 DIAGNOSIS — Z86711 Personal history of pulmonary embolism: Secondary | ICD-10-CM | POA: Diagnosis not present

## 2022-10-12 DIAGNOSIS — E7800 Pure hypercholesterolemia, unspecified: Secondary | ICD-10-CM | POA: Insufficient documentation

## 2022-10-12 DIAGNOSIS — R7303 Prediabetes: Secondary | ICD-10-CM | POA: Diagnosis not present

## 2022-10-12 DIAGNOSIS — G4733 Obstructive sleep apnea (adult) (pediatric): Secondary | ICD-10-CM | POA: Diagnosis not present

## 2022-10-12 LAB — CBC
HCT: 47.6 % — ABNORMAL HIGH (ref 36.0–46.0)
Hemoglobin: 15.6 g/dL — ABNORMAL HIGH (ref 12.0–15.0)
MCH: 27.2 pg (ref 26.0–34.0)
MCHC: 32.8 g/dL (ref 30.0–36.0)
MCV: 82.9 fL (ref 80.0–100.0)
Platelets: 210 10*3/uL (ref 150–400)
RBC: 5.74 MIL/uL — ABNORMAL HIGH (ref 3.87–5.11)
RDW: 14.6 % (ref 11.5–15.5)
WBC: 7.1 10*3/uL (ref 4.0–10.5)
nRBC: 0 % (ref 0.0–0.2)

## 2022-10-12 NOTE — Patient Instructions (Signed)
[place] at [facility] Discharge Instructions  You were seen today by Tarri Abernethy PA-C for your elevated red blood cells.  This is most likely from your sleep apnea - continue to use your CPAP regularly to keep your blood cells from going to high.  Continue aspirin 81 mg daily to decrease your risk of blood clots.  FOLLOW-UP APPOINTMENT: Labs in 6 months with phone visit after labs.   Thank you for choosing [place] at [facility] to provide your oncology and hematology care.  To afford each patient quality time with our provider, please arrive at least 15 minutes before your scheduled appointment time.   If you have a lab appointment with the [facility] please come in thru the Main Entrance and check in at the main information desk.  You need to re-schedule your appointment should you arrive 10 or more minutes late.  We strive to give you quality time with our providers, and arriving late affects you and other patients whose appointments are after yours.  Also, if you no show three or more times for appointments you may be dismissed from the clinic at the providers discretion.     Again, thank you for choosing [facility].  Our hope is that these requests will decrease the amount of time that you wait before being seen by our physicians.       _____________________________________________________________  Should you have questions after your visit to [facility], please contact our office at [phone] and follow the prompts.  Our office hours are 8:00 a.m. and 4:30 p.m. Monday - Friday.  Please note that voicemails left after 4:00 p.m. may not be returned until the following business day.  We are closed weekends and major holidays.  You do have access to a nurse 24-7, just call the main number to the clinic [phone] and do not press any options, hold on the line and a nurse will answer the phone.    For  prescription refill requests, have your pharmacy contact our office and allow 72 hours.    Due to Covid, you will need to wear a mask upon entering the hospital. If you do not have a mask, a mask will be given to you at the Main Entrance upon arrival. For doctor visits, patients may have 1 support person age 61 or older with them. For treatment visits, patients can not have anyone with them due to social distancing guidelines and our immunocompromised population.

## 2022-11-09 ENCOUNTER — Other Ambulatory Visit: Payer: Self-pay | Admitting: Internal Medicine

## 2022-11-09 DIAGNOSIS — E782 Mixed hyperlipidemia: Secondary | ICD-10-CM

## 2022-12-12 ENCOUNTER — Other Ambulatory Visit: Payer: Self-pay

## 2022-12-12 ENCOUNTER — Other Ambulatory Visit: Payer: Self-pay | Admitting: Internal Medicine

## 2022-12-12 DIAGNOSIS — E782 Mixed hyperlipidemia: Secondary | ICD-10-CM

## 2022-12-12 MED ORDER — ROSUVASTATIN CALCIUM 10 MG PO TABS: 10.0000 mg | ORAL_TABLET | Freq: Every day | ORAL | 5 refills | Status: DC

## 2022-12-12 MED ORDER — MICROLET LANCETS MISC: 5 refills | Status: DC

## 2022-12-12 MED ORDER — CONTOUR NEXT TEST VI STRP: ORAL_STRIP | 5 refills | Status: DC

## 2022-12-13 ENCOUNTER — Encounter: Payer: Self-pay | Admitting: Internal Medicine

## 2022-12-13 ENCOUNTER — Other Ambulatory Visit: Payer: Self-pay

## 2022-12-13 ENCOUNTER — Ambulatory Visit (INDEPENDENT_AMBULATORY_CARE_PROVIDER_SITE_OTHER): Payer: BC Managed Care – PPO | Admitting: Internal Medicine

## 2022-12-13 ENCOUNTER — Telehealth: Payer: Self-pay | Admitting: Internal Medicine

## 2022-12-13 VITALS — BP 123/71 | HR 69 | Temp 98.5°F | Resp 16 | Ht 63.0 in | Wt 169.1 lb

## 2022-12-13 DIAGNOSIS — E1169 Type 2 diabetes mellitus with other specified complication: Secondary | ICD-10-CM | POA: Diagnosis not present

## 2022-12-13 DIAGNOSIS — E1165 Type 2 diabetes mellitus with hyperglycemia: Secondary | ICD-10-CM

## 2022-12-13 DIAGNOSIS — J40 Bronchitis, not specified as acute or chronic: Secondary | ICD-10-CM

## 2022-12-13 MED ORDER — ACCU-CHEK GUIDE VI STRP: ORAL_STRIP | 12 refills | Status: DC

## 2022-12-13 MED ORDER — AMOXICILLIN-POT CLAVULANATE 875-125 MG PO TABS: 1.0000 | ORAL_TABLET | Freq: Two times a day (BID) | ORAL | 0 refills | Status: DC

## 2022-12-13 MED ORDER — BLOOD GLUCOSE METER KIT: PACK | 0 refills | Status: DC

## 2022-12-13 MED ORDER — BLOOD GLUCOSE METER KIT: PACK | 0 refills | Status: AC

## 2022-12-13 MED ORDER — ACCU-CHEK GUIDE ME W/DEVICE KIT: 1.0000 | PACK | Freq: Four times a day (QID) | 0 refills | Status: AC

## 2022-12-13 NOTE — Telephone Encounter (Signed)
Sent to pharmacy 

## 2022-12-13 NOTE — Telephone Encounter (Signed)
Mallory [name] called from [facility] said patient testing supplies that are covered by her insurance is the  Accu check guide and needs prescription for the meter sent to her pharmacy.   Pharmacy: [facility]

## 2022-12-13 NOTE — Patient Instructions (Signed)
Thank you, Ms.Lequita Halt for allowing [place] to provide your care today.   I have ordered the following labs for you:  1. Bronchitis - amoxicillin-clavulanate (AUGMENTIN) 875-125 MG tablet; Take 1 tablet by mouth 2 (two) times daily.  Dispense: 14 tablet; Refill: 0  2. Type 2 diabetes mellitus with other specified complication, without long-term current use of insulin (HCC)  - Blood Glucose Monitoring Suppl (ACCU-CHEK GUIDE ME) w/Device KIT; 1 Device by Does not apply route 4 (four) times daily.  Dispense: 1 kit; Refill: 0 - glucose blood (ACCU-CHEK GUIDE) test strip; Use as instructed  Dispense: 100 each; Refill: 12    Reminders: Follow up with me if not improving and I will send you for chest xray.     Tamsen Snider, M.D.

## 2022-12-13 NOTE — Progress Notes (Unsigned)
   HPI:Ms.Mallory [name] is a 67 y.o. female who presents for evaluation of cough and shortness of breath. For the details of today's visit, please refer to the assessment and plan.  Physical Exam: Vitals:   12/13/22 1525  BP: 123/71  Pulse: 69  Resp: 16  Temp: 98.5 F (36.9 C)  TempSrc: Oral  SpO2: 95%  Weight: 169 lb 1.9 oz (76.7 kg)  Height: '5\' 3"'$  (1.6 m)     Physical Exam Constitutional:      Appearance: She is well-developed and well-groomed.  HENT:     Right Ear: Tympanic membrane and ear canal normal.     Left Ear: Tympanic membrane and ear canal normal.     Nose: Congestion and rhinorrhea present.     Mouth/Throat:     Mouth: Mucous membranes are moist.     Pharynx: No oropharyngeal exudate.  Eyes:     General: No scleral icterus.    Conjunctiva/sclera: Conjunctivae normal.  Cardiovascular:     Rate and Rhythm: Normal rate and regular rhythm.     Heart sounds: No murmur heard.    No friction rub. No gallop.  Pulmonary:     Effort: Pulmonary effort is normal.     Breath sounds: No wheezing, rhonchi or rales.  Musculoskeletal:     Right lower leg: No edema.     Left lower leg: No edema.  Skin:    General: Skin is warm and dry.      Assessment & Plan:   Mallory [name] was seen today for cough.  Bronchitis Assessment & Plan: Patient presents with cough and shortness of breath for two weeks. Elevated temperature early on. She felt some better one day this past weekend ,but then started feeling worse again. She has tried OTC medication for cough, congestion, and malaise. She has not improved and decided to make appointment for evaluation. Her husband has similar symptoms.   Plan: Test for RSV , COVID, and FLU I expect initial viral cause of symptom followed by bacterial infection given ongoing symptoms for 2 weeks , improvement initially and then worsening. Will prescribe antibiotics for bronchitis Follow up if not improving with antibiotic course and will send for  chest xray.   Orders: -     Amoxicillin-Pot Clavulanate; Take 1 tablet by mouth 2 (two) times daily.  Dispense: 14 tablet; Refill: 0 -     COVID-19, Flu A+B and RSV  Type 2 diabetes mellitus with other specified complication, without long-term current use of insulin ([place]) Assessment & Plan: Patient needs a glucometer. Initial glucometer prescription not covered by insurance.   Orders: -     Accu-Chek Guide Me; 1 Device by Does not apply route 4 (four) times daily.  Dispense: 1 kit; Refill: 0 -     Accu-Chek Guide; Use as instructed  Dispense: 100 each; Refill: 12      Lorene Dy, MD

## 2022-12-14 DIAGNOSIS — J40 Bronchitis, not specified as acute or chronic: Secondary | ICD-10-CM | POA: Insufficient documentation

## 2022-12-14 DIAGNOSIS — J209 Acute bronchitis, unspecified: Secondary | ICD-10-CM | POA: Insufficient documentation

## 2022-12-14 NOTE — Assessment & Plan Note (Signed)
Patient presents with cough and shortness of breath for two weeks. Elevated temperature early on. She felt some better one day this past weekend ,but then started feeling worse again. She has tried OTC medication for cough, congestion, and malaise. She has not improved and decided to make appointment for evaluation. Her husband has similar symptoms.   Plan: Test for RSV , COVID, and FLU I expect initial viral cause of symptom followed by bacterial infection given ongoing symptoms for 2 weeks , improvement initially and then worsening. Will prescribe antibiotics for bronchitis Follow up if not improving with antibiotic course and will send for chest xray.

## 2022-12-14 NOTE — Assessment & Plan Note (Signed)
Patient needs a glucometer. Initial glucometer prescription not covered by insurance.

## 2022-12-15 LAB — COVID-19, FLU A+B AND RSV
Influenza A, NAA: NOT DETECTED
Influenza B, NAA: NOT DETECTED
RSV, NAA: NOT DETECTED
SARS-CoV-2, NAA: NOT DETECTED

## 2022-12-22 ENCOUNTER — Ambulatory Visit (HOSPITAL_COMMUNITY)
Admission: RE | Admit: 2022-12-22 | Discharge: 2022-12-22 | Disposition: A | Discharge status: Home / Self Care | Payer: BC Managed Care – PPO | Source: Ambulatory Visit | Attending: Family Medicine | Admitting: Family Medicine

## 2022-12-22 ENCOUNTER — Encounter: Payer: Self-pay | Admitting: Family Medicine

## 2022-12-22 ENCOUNTER — Ambulatory Visit (INDEPENDENT_AMBULATORY_CARE_PROVIDER_SITE_OTHER): Payer: BC Managed Care – PPO | Admitting: Family Medicine

## 2022-12-22 VITALS — BP 128/82 | HR 70 | Ht 63.0 in | Wt 169.0 lb

## 2022-12-22 DIAGNOSIS — R0602 Shortness of breath: Secondary | ICD-10-CM | POA: Diagnosis not present

## 2022-12-22 DIAGNOSIS — R059 Cough, unspecified: Secondary | ICD-10-CM | POA: Diagnosis not present

## 2022-12-22 DIAGNOSIS — J988 Other specified respiratory disorders: Secondary | ICD-10-CM

## 2022-12-22 DIAGNOSIS — B9789 Other viral agents as the cause of diseases classified elsewhere: Secondary | ICD-10-CM

## 2022-12-22 DIAGNOSIS — J989 Respiratory disorder, unspecified: Secondary | ICD-10-CM | POA: Diagnosis not present

## 2022-12-22 DIAGNOSIS — J9811 Atelectasis: Secondary | ICD-10-CM | POA: Diagnosis not present

## 2022-12-22 MED ORDER — PREDNISONE 20 MG PO TABS: 20.0000 mg | ORAL_TABLET | Freq: Two times a day (BID) | ORAL | 0 refills | Status: AC

## 2022-12-22 MED ORDER — BENZONATATE 100 MG PO CAPS: 100.0000 mg | ORAL_CAPSULE | Freq: Two times a day (BID) | ORAL | 0 refills | Status: DC | PRN

## 2022-12-22 MED ORDER — AZELASTINE-FLUTICASONE 137-50 MCG/ACT NA SUSP: 1.0000 | Freq: Two times a day (BID) | NASAL | 1 refills | Status: DC

## 2022-12-22 MED ORDER — LEVOCETIRIZINE DIHYDROCHLORIDE 5 MG PO TABS: 5.0000 mg | ORAL_TABLET | Freq: Every evening | ORAL | 1 refills | Status: DC

## 2022-12-22 NOTE — Progress Notes (Signed)
Patient Office Visit   Subjective   Patient ID: Mallory [name], female    DOB: [birth year]  Age: 67 y.o. MRN: [id number]  CC:  Chief Complaint  Patient presents with   Cough    Patient was seen last week by Dr. Court Joy. Started on amox, started to improve but got bad again. Chest is hurting, slight chills.     HPI Mallory [name] 67 year old female,presents to clinic for chest congestion and shortness of breath. She  has a past medical history of GERD (gastroesophageal reflux disease), HLD (hyperlipidemia), IBS (irritable bowel syndrome), Osteoarthritis of knee, Pulmonary embolism ([place]), and Sleep apnea.  Patient complains fever. Patient describes symptoms of shortness of breath ,chills without rigors, chest congestion, rhinorrhea, fatigue, and productive cough with green- yellow sputum production. Symptoms began a few weeks ago and are gradually worsening since that time. Patient denies nausea and vomiting. Treatment thus far includes OTC analgesics/antipyretics: somewhat effective and oral antibiotics per medication orders: somewhat effective.   Outpatient Encounter Medications as of 12/22/2022  Medication Sig   amoxicillin-clavulanate (AUGMENTIN) 875-125 MG tablet Take 1 tablet by mouth 2 (two) times daily.   aspirin EC 81 MG tablet Take 81 mg by mouth every other day. Swallow whole.   Azelastine-Fluticasone 137-50 MCG/ACT SUSP Place 1 spray into the nose every 12 (twelve) hours.   benzonatate (TESSALON) 100 MG capsule Take 1 capsule (100 mg total) by mouth 2 (two) times daily as needed for cough.   blood glucose meter kit and supplies Dispense based on patient and insurance preference. Use up to four times daily as directed. (FOR ICD-10 E10.9, E11.9).   Blood Glucose Monitoring Suppl (ACCU-CHEK GUIDE ME) w/Device KIT 1 Device by Does not apply route 4 (four) times daily.   cholecalciferol (VITAMIN D3) 25 MCG (1000 UNIT) tablet Take 1,000 Units by mouth daily.   glucose blood (ACCU-CHEK  GUIDE) test strip Use as instructed   hyoscyamine (LEVSIN SL) 0.125 MG SL tablet TAKE 1 OR 2 TABLETS BY MOUTH EVERY 6 HOURS AS NEEDED   ibuprofen (ADVIL,MOTRIN) 200 MG tablet Take 400 mg by mouth daily as needed for mild pain or moderate pain. Pain   levocetirizine (XYZAL) 5 MG tablet Take 1 tablet (5 mg total) by mouth every evening.   Microlet Lancets MISC USE TO CHECK BLOOD SUGAR FOUR TIMES DAILY AS DIRECTED   pantoprazole (PROTONIX) 40 MG tablet TAKE (1) TABLET BY MOUTH DAILY SHORTLY BEFORE BREAKFAST MEAL EACH DAY   predniSONE (DELTASONE) 20 MG tablet Take 1 tablet (20 mg total) by mouth 2 (two) times daily for 5 days.   rosuvastatin (CRESTOR) 10 MG tablet Take 1 tablet (10 mg total) by mouth daily.   sertraline (ZOLOFT) 50 MG tablet TAKE ONE TABLET BY MOUTH ONCE DAILY.   UNABLE TO FIND Home Sleep Study Kit - use as directed to detect sleep apnea Dx: D75.1; R06.83   No facility-administered encounter medications on file as of 12/22/2022.    Past Surgical History:  Procedure Laterality Date   CESAREAN SECTION  1991   [facility]   CESAREAN SECTION N/A    [facility] 01/16/2021   CHOLECYSTECTOMY  15 yrs ago   mc   COLONOSCOPY  05/24/2012   Procedure: COLONOSCOPY;  Surgeon: Rogene Houston, MD;  [facility];  Service: Endoscopy;  Laterality: N/A;  220   ERCP  08/12/2011   Procedure: ENDOSCOPIC RETROGRADE CHOLANGIOPANCREATOGRAPHY (ERCP);  Surgeon: Rogene Houston, MD;  Location: [facility];  Service:  Endoscopy;  Laterality: N/A;   ERCP W/ SPHICTEROTOMY  08/12/11   Dr Rehman-?microlithiasis, SOD   ESOPHAGOGASTRODUODENOSCOPY  11/25/2011   Procedure: ESOPHAGOGASTRODUODENOSCOPY (EGD);  Surgeon: Rogene Houston, MD;  [facility];  Service: Endoscopy;  Laterality: N/A;  830   EUS  01/19/2012   Procedure: UPPER ENDOSCOPIC ULTRASOUND (EUS) LINEAR;  Surgeon: Milus Banister, MD;  [facility];  Service: Endoscopy;  Laterality: N/A;  pt moved up a hour early by AW , Patty @ [place]  office ok'd / pt was called by [place] 01/16/2021   SPHINCTEROTOMY  08/12/2011   Procedure: Joan Mayans;  Surgeon: Rogene Houston, MD;  [facility];  Service: Endoscopy;;  with ballon passage   TOTAL KNEE ARTHROPLASTY Right 09/13/2017   TOTAL KNEE ARTHROPLASTY Right 09/13/2017   Procedure: RIGHT TOTAL KNEE ARTHROPLASTY;  Surgeon: Mcarthur Rossetti, MD;  Location: [facility];  Service: Orthopedics;  Laterality: Right;   TUBAL LIGATION      Review of Systems  Constitutional:  Positive for chills and fever.  Respiratory:  Positive for cough, sputum production and shortness of breath.   Cardiovascular:  Positive for chest pain.  Gastrointestinal:  Negative for nausea and vomiting.  Neurological:  Negative for dizziness and headaches.      Objective    BP 128/82   Pulse 70   Ht '5\' 3"'$  (1.6 m)   Wt 169 lb (76.7 kg)   LMP 05/11/2011   SpO2 94%   BMI 29.94 kg/m   Physical Exam Constitutional:      Appearance: Normal appearance.  HENT:     Nose: Congestion and rhinorrhea present.  Cardiovascular:     Rate and Rhythm: Normal rate and regular rhythm.     Pulses: Normal pulses.     Heart sounds: Normal heart sounds.  Pulmonary:     Effort: Pulmonary effort is normal.     Breath sounds: Normal breath sounds.  Skin:    General: Skin is warm and dry.     Capillary Refill: Capillary refill takes less than 2 seconds.  Neurological:     General: No focal deficit present.     Mental Status: She is alert.  Psychiatric:        Mood and Affect: Mood normal.       Assessment & Plan:  Viral respiratory illness -     predniSONE; Take 1 tablet (20 mg total) by mouth 2 (two) times daily for 5 days.  Dispense: 10 tablet; Refill: 0 -     Levocetirizine Dihydrochloride; Take 1 tablet (5 mg total) by mouth every evening.  Dispense: 15 tablet; Refill: 1 -     Azelastine-Fluticasone; Place 1 spray into the nose every 12 (twelve) hours.  Dispense: 23 g;  Refill: 1 -     DG Chest 2 View; Future -     Benzonatate; Take 1 capsule (100 mg total) by mouth 2 (two) times daily as needed for cough.  Dispense: 20 capsule; Refill: 0  Respiratory illness Assessment & Plan: Patient completed Antibiotic course of Augmentin 875-125 mg. Symptoms are still persistent. Chest Xray ordered- awaiting results, will follow up   Prescribed short 5 day course of Prednisone 20 mg Xyzal 5 mg, benzonatate 100 mg, azelastine-fluticasone 137-50 mcg nasal spray Discussed symptomatic treatment, rest, increase oral fluid intake. Take OTC tylenol for fever. Follow-up for worsening or persistent symptoms. Patient verbalizes understanding regarding plan of care and all questions  answered.  COVID-19, Flu A+B and RSV - all negative       No follow-ups on file.   Renard Hamper Ria Comment, FNP

## 2022-12-22 NOTE — Patient Instructions (Signed)
It was pleasure meeting with you today. Please take medications as prescribed. Follow up with your primary health provider if any health concerns arises. If symptoms worsen please contact your primary care provider and/or visit the emergency department.  

## 2022-12-22 NOTE — Assessment & Plan Note (Addendum)
Patient completed Antibiotic course of Augmentin 875-125 mg. Symptoms are still persistent. Chest Xray ordered- awaiting results, will follow up   Prescribed short 5 day course of Prednisone 20 mg Xyzal 5 mg, benzonatate 100 mg, azelastine-fluticasone 137-50 mcg nasal spray Discussed symptomatic treatment, rest, increase oral fluid intake. Take OTC tylenol for fever. Follow-up for worsening or persistent symptoms. Patient verbalizes understanding regarding plan of care and all questions answered.  COVID-19, Flu A+B and RSV - all negative

## 2022-12-23 DIAGNOSIS — E782 Mixed hyperlipidemia: Secondary | ICD-10-CM | POA: Diagnosis not present

## 2022-12-23 DIAGNOSIS — E119 Type 2 diabetes mellitus without complications: Secondary | ICD-10-CM | POA: Diagnosis not present

## 2022-12-24 LAB — HEMOGLOBIN A1C
Est. average glucose Bld gHb Est-mCnc: 140 mg/dL
Hgb A1c MFr Bld: 6.5 % — ABNORMAL HIGH (ref 4.8–5.6)

## 2022-12-24 LAB — CMP14+EGFR
ALT: 21 IU/L (ref 0–32)
AST: 23 IU/L (ref 0–40)
Albumin/Globulin Ratio: 1.6 (ref 1.2–2.2)
Albumin: 4.2 g/dL (ref 3.9–4.9)
Alkaline Phosphatase: 147 IU/L — ABNORMAL HIGH (ref 44–121)
BUN/Creatinine Ratio: 15 (ref 12–28)
BUN: 11 mg/dL (ref 8–27)
Bilirubin Total: 0.4 mg/dL (ref 0.0–1.2)
CO2: 21 mmol/L (ref 20–29)
Calcium: 9.6 mg/dL (ref 8.7–10.3)
Chloride: 104 mmol/L (ref 96–106)
Creatinine, Ser: 0.74 mg/dL (ref 0.57–1.00)
Globulin, Total: 2.7 g/dL (ref 1.5–4.5)
Glucose: 101 mg/dL — ABNORMAL HIGH (ref 70–99)
Potassium: 4.3 mmol/L (ref 3.5–5.2)
Sodium: 142 mmol/L (ref 134–144)
Total Protein: 6.9 g/dL (ref 6.0–8.5)
eGFR: 90 mL/min/{1.73_m2} (ref >59)

## 2022-12-24 LAB — LIPID PANEL
Chol/HDL Ratio: 2.7 ratio (ref 0.0–4.4)
Cholesterol, Total: 155 mg/dL (ref 100–199)
HDL: 58 mg/dL (ref >39)
LDL Chol Calc (NIH): 83 mg/dL (ref 0–99)
Triglycerides: 75 mg/dL (ref 0–149)
VLDL Cholesterol Cal: 14 mg/dL (ref 5–40)

## 2022-12-26 ENCOUNTER — Other Ambulatory Visit: Payer: Self-pay | Admitting: Family Medicine

## 2022-12-26 NOTE — Progress Notes (Signed)
Please inform patient Xray shows lungs are clear, showed minimal left basilar atelectasis  and will resolve on its own. and no further intervention needed for this I advise to start breathing exercise with incentive spirometer.  Incentive Spirometer was sent to her pharmacy. It will help prevent lung infections by expanding your lungs, strengthening your lungs, keeping your lungs inflated and clearing mucus and other secretions from your chest and lungs

## 2022-12-28 ENCOUNTER — Ambulatory Visit (INDEPENDENT_AMBULATORY_CARE_PROVIDER_SITE_OTHER): Payer: BC Managed Care – PPO | Admitting: Internal Medicine

## 2022-12-28 ENCOUNTER — Encounter: Payer: Self-pay | Admitting: Internal Medicine

## 2022-12-28 VITALS — BP 130/78 | HR 65 | Ht 63.0 in | Wt 167.0 lb

## 2022-12-28 DIAGNOSIS — E782 Mixed hyperlipidemia: Secondary | ICD-10-CM

## 2022-12-28 DIAGNOSIS — Z0001 Encounter for general adult medical examination with abnormal findings: Secondary | ICD-10-CM | POA: Insufficient documentation

## 2022-12-28 DIAGNOSIS — J40 Bronchitis, not specified as acute or chronic: Secondary | ICD-10-CM

## 2022-12-28 DIAGNOSIS — E1169 Type 2 diabetes mellitus with other specified complication: Secondary | ICD-10-CM | POA: Diagnosis not present

## 2022-12-28 DIAGNOSIS — E559 Vitamin D deficiency, unspecified: Secondary | ICD-10-CM

## 2022-12-28 DIAGNOSIS — F419 Anxiety disorder, unspecified: Secondary | ICD-10-CM

## 2022-12-28 MED ORDER — SERTRALINE HCL 50 MG PO TABS: 50.0000 mg | ORAL_TABLET | Freq: Every day | ORAL | 1 refills | Status: DC

## 2022-12-28 NOTE — Patient Instructions (Addendum)
Please continue to take medications as prescribed.  Please take Mucinex or Robitussin as needed for cough.  Please continue to follow low carb diet and perform moderate exercise/walking at least 150 mins/week.  Please get fasting blood tests done before the next visit.

## 2022-12-28 NOTE — Progress Notes (Signed)
Established Patient Office Visit  Subjective:  Patient ID: Mallory [name], female    DOB: 1956-02-28  Age: 67 y.o. MRN: [id number]  CC:  Chief Complaint  Patient presents with   Annual Exam    Patient is here for her annual exam. She has been sick for four weeks, has been seen by two doctors and taken two course of medications. Patient is still coughing    HPI Mallory [name] is a 67 y.o. female with past medical history of type II DM with HLD, GERD, IBS, and GAD who presents for annual physical.  Acute bronchitis: She has been having cough for the last 4 weeks.  She was given Augmentin initially, and later prednisone and Xyzal.  She denies any fever, chills, dyspnea or wheezing currently.  She still has dry cough, but denies hemoptysis, chronic fatigue or night sweats.  Type II DM with HLD: Diet controlled DM.  Her HbA1c has increased to 6.5 now.  She reports that she has been following strict low-carb diet recently, but had oral prednisone recently.  She has been taking Crestor for HLD and tolerates it well.  Her lipid profile shows improvement in LDL.  GAD: She takes Zoloft.  Denies spells of anxiety, anhedonia, SI or HI.    Past Medical History:  Diagnosis Date   GERD (gastroesophageal reflux disease)    HLD (hyperlipidemia)    IBS (irritable bowel syndrome)    Osteoarthritis of knee    Right; had right TKR   Pulmonary embolism ([facility])    5 yrs ago (unknown region)   Sleep apnea     Past Surgical History:  Procedure Laterality Date   CESAREAN SECTION  1991   mc   CESAREAN SECTION N/A    [facility] 01/16/2021   CHOLECYSTECTOMY  15 yrs ago   mc   COLONOSCOPY  05/24/2012   Procedure: COLONOSCOPY;  Surgeon: Rogene Houston, MD;  [facility];  Service: Endoscopy;  Laterality: N/A;  220   ERCP  08/12/2011   Procedure: ENDOSCOPIC RETROGRADE CHOLANGIOPANCREATOGRAPHY (ERCP);  Surgeon: Rogene Houston, MD;  [facility];  Service: Endoscopy;  Laterality: N/A;   ERCP W/  SPHICTEROTOMY  08/12/11   Dr Rehman-?microlithiasis, SOD   ESOPHAGOGASTRODUODENOSCOPY  11/25/2011   Procedure: ESOPHAGOGASTRODUODENOSCOPY (EGD);  Surgeon: Rogene Houston, MD;  [facility];  Service: Endoscopy;  Laterality: N/A;  830   EUS  01/19/2012   Procedure: UPPER ENDOSCOPIC ULTRASOUND (EUS) LINEAR;  Surgeon: Milus Banister, MD;  [facility];  Service: Endoscopy;  Laterality: N/A;  pt moved up a hour early by AW , Patty @ [place] office ok'd / pt was called by [facility] 01/16/2021   SPHINCTEROTOMY  08/12/2011   Procedure: Joan Mayans;  Surgeon: Rogene Houston, MD;  [facility];  Service: Endoscopy;;  with ballon passage   TOTAL KNEE ARTHROPLASTY Right 09/13/2017   TOTAL KNEE ARTHROPLASTY Right 09/13/2017   Procedure: RIGHT TOTAL KNEE ARTHROPLASTY;  Surgeon: Mcarthur Rossetti, MD;  Location: [facility];  Service: Orthopedics;  Laterality: Right;   TUBAL LIGATION      Family History  Problem Relation Age of Onset   Diabetes Mother    Heart failure Father    Heart disease Sister    Diabetes Brother    Heart disease Maternal Grandmother    Cancer Maternal Grandfather        ? pancreatic or lung  Anesthesia problems Neg Hx    Hypotension Neg Hx    Malignant hyperthermia Neg Hx    Pseudochol deficiency Neg Hx    Stroke Neg Hx    Colon cancer Neg Hx    Colon polyps Neg Hx    Esophageal cancer Neg Hx    Rectal cancer Neg Hx    Stomach cancer Neg Hx     Social History   Socioeconomic History   Marital status: Married    Spouse name: Not on file   Number of children: 2   Years of education: Not on file   Highest education level: Not on file  Occupational History   Occupation: [employer]    Comment: Medical sales representative  Tobacco Use   Smoking status: Never   Smokeless tobacco: Never  Vaping Use   Vaping Use: Never used  Substance and Sexual Activity   Alcohol use: Yes    Alcohol/week: 0.0 standard drinks of alcohol     Comment: occasional wine or moonshine, but only on vacation   Drug use: No   Sexual activity: Yes    Birth control/protection: Post-menopausal  Other Topics Concern   Not on file  Social History Narrative    2 daughters      Social Determinants of Health   Financial Resource Strain: Not on file  Food Insecurity: Not on file  Transportation Needs: Not on file  Physical Activity: Not on file  Stress: Not on file  Social Connections: Not on file  Intimate Partner Violence: Not on file    Outpatient Medications Prior to Visit  Medication Sig Dispense Refill   aspirin EC 81 MG tablet Take 81 mg by mouth every other day. Swallow whole.     Azelastine-Fluticasone 137-50 MCG/ACT SUSP Place 1 spray into the nose every 12 (twelve) hours. 23 g 1   benzonatate (TESSALON) 100 MG capsule Take 1 capsule (100 mg total) by mouth 2 (two) times daily as needed for cough. 20 capsule 0   blood glucose meter kit and supplies Dispense based on patient and insurance preference. Use up to four times daily as directed. (FOR ICD-10 E10.9, E11.9). 1 each 0   Blood Glucose Monitoring Suppl (ACCU-CHEK GUIDE ME) w/Device KIT 1 Device by Does not apply route 4 (four) times daily. 1 kit 0   cholecalciferol (VITAMIN D3) 25 MCG (1000 UNIT) tablet Take 1,000 Units by mouth daily.     glucose blood (ACCU-CHEK GUIDE) test strip Use as instructed 100 each 12   hyoscyamine (LEVSIN SL) 0.125 MG SL tablet TAKE 1 OR 2 TABLETS BY MOUTH EVERY 6 HOURS AS NEEDED 50 tablet 0   ibuprofen (ADVIL,MOTRIN) 200 MG tablet Take 400 mg by mouth daily as needed for mild pain or moderate pain. Pain     levocetirizine (XYZAL) 5 MG tablet Take 1 tablet (5 mg total) by mouth every evening. 15 tablet 1   Microlet Lancets MISC USE TO CHECK BLOOD SUGAR FOUR TIMES DAILY AS DIRECTED 100 each 5   pantoprazole (PROTONIX) 40 MG tablet TAKE (1) TABLET BY MOUTH DAILY SHORTLY BEFORE BREAKFAST MEAL EACH DAY 30 tablet 6   rosuvastatin (CRESTOR) 10 MG  tablet Take 1 tablet (10 mg total) by mouth daily. 30 tablet 5   UNABLE TO FIND Home Sleep Study Kit - use as directed to detect sleep apnea Dx: D75.1; R06.83 1 each 0   amoxicillin-clavulanate (AUGMENTIN) 875-125 MG tablet Take 1 tablet by mouth 2 (two) times daily. 14 tablet 0  sertraline (ZOLOFT) 50 MG tablet TAKE ONE TABLET BY MOUTH ONCE DAILY. 90 tablet 1   No facility-administered medications prior to visit.    Allergies  Allergen Reactions   Promethazine Hcl Hives   Phenytoin Itching   Hydromorphone Itching   Macrolides And Ketolides Itching   Nitrofurantoin Monohyd Macro Itching    ROS Review of Systems  Constitutional:  Negative for chills and fever.  HENT:  Negative for congestion, sinus pressure, sinus pain and sore throat.   Eyes:  Negative for pain and discharge.  Respiratory:  Positive for cough. Negative for shortness of breath.   Cardiovascular:  Negative for chest pain and palpitations.  Gastrointestinal:  Negative for abdominal pain, diarrhea, nausea and vomiting.  Endocrine: Negative for polydipsia and polyuria.  Genitourinary:  Negative for dysuria and hematuria.  Musculoskeletal:  Negative for neck pain and neck stiffness.  Skin:  Negative for rash.  Neurological:  Negative for dizziness and weakness.  Psychiatric/Behavioral:  Negative for agitation and behavioral problems.       Objective:    Physical Exam Vitals reviewed.  Constitutional:      General: She is not in acute distress.    Appearance: She is not diaphoretic.  HENT:     Head: Normocephalic and atraumatic.     Nose: Nose normal. No congestion.     Mouth/Throat:     Mouth: Mucous membranes are moist.     Pharynx: No posterior oropharyngeal erythema.  Eyes:     General: No scleral icterus.    Extraocular Movements: Extraocular movements intact.  Cardiovascular:     Rate and Rhythm: Normal rate and regular rhythm.     Pulses: Normal pulses.     Heart sounds: Normal heart sounds. No  murmur heard. Pulmonary:     Breath sounds: Normal breath sounds. No wheezing or rales.  Abdominal:     Palpations: Abdomen is soft.     Tenderness: There is no abdominal tenderness.  Musculoskeletal:     Cervical back: Neck supple. No tenderness.     Right lower leg: No edema.     Left lower leg: No edema.  Skin:    General: Skin is warm.     Findings: No rash.  Neurological:     General: No focal deficit present.     Mental Status: She is alert and oriented to person, place, and time.  Psychiatric:        Mood and Affect: Mood normal.        Behavior: Behavior normal.     BP 130/78 (BP Location: Right Arm, Patient Position: Sitting, Cuff Size: Normal)   Pulse 65   Ht 5\' 3"  (1.6 m)   Wt 167 lb (75.8 kg)   LMP 05/11/2011   SpO2 96%   BMI 29.58 kg/m  Wt Readings from Last 3 Encounters:  12/28/22 167 lb (75.8 kg)  12/22/22 169 lb (76.7 kg)  12/13/22 169 lb 1.9 oz (76.7 kg)    Lab Results  Component Value Date   TSH 3.620 09/22/2021   Lab Results  Component Value Date   WBC 7.1 10/12/2022   HGB 15.6 (H) 10/12/2022   HCT 47.6 (H) 10/12/2022   MCV 82.9 10/12/2022   PLT 210 10/12/2022   Lab Results  Component Value Date   NA 142 12/23/2022   K 4.3 12/23/2022   CO2 21 12/23/2022   GLUCOSE 101 (H) 12/23/2022   BUN 11 12/23/2022   CREATININE 0.74 12/23/2022   BILITOT 0.4 12/23/2022  ALKPHOS 147 (H) 12/23/2022   AST 23 12/23/2022   ALT 21 12/23/2022   PROT 6.9 12/23/2022   ALBUMIN 4.2 12/23/2022   CALCIUM 9.6 12/23/2022   ANIONGAP 6 11/27/2020   EGFR 90 12/23/2022   GFR 75.19 01/25/2019   Lab Results  Component Value Date   CHOL 155 12/23/2022   Lab Results  Component Value Date   HDL 58 12/23/2022   Lab Results  Component Value Date   LDLCALC 83 12/23/2022   Lab Results  Component Value Date   TRIG 75 12/23/2022   Lab Results  Component Value Date   CHOLHDL 2.7 12/23/2022   Lab Results  Component Value Date   HGBA1C 6.5 (H) 12/23/2022       Assessment & Plan:   Problem List Items Addressed This Visit       Respiratory   Bronchitis    Recently completed Augmentin and prednisone Advised to take Mucinex or Robitussin as needed for cough now Xyzal for allergies        Endocrine   Type 2 diabetes mellitus with other specified complication ([place])    Lab Results  Component Value Date   HGBA1C 6.5 (H) 12/23/2022   Diet controlled Associated with HLD, GERD and GAD Advised to follow diabetic diet On statin F/u CMP and lipid panel Diabetic eye exam: Advised to follow up with Ophthalmology for diabetic eye exam      Relevant Orders   Hemoglobin A1c   CMP14+EGFR   Urine Microalbumin w/creat. ratio     Other   HLD (hyperlipidemia)    Lipid profile reviewed - improved now On Crestor now      Relevant Orders   Lipid panel   Anxiety    Well-controlled with Zoloft      Relevant Medications   sertraline (ZOLOFT) 50 MG tablet   Other Relevant Orders   TSH   Encounter for general adult medical examination with abnormal findings - Primary    Physical exam as documented. Fasting blood tests ordered today.       Other Visit Diagnoses     Vitamin D deficiency       Relevant Orders   VITAMIN D 25 Hydroxy (Vit-D Deficiency, Fractures)       Meds ordered this encounter  Medications   sertraline (ZOLOFT) 50 MG tablet    Sig: Take 1 tablet (50 mg total) by mouth daily.    Dispense:  90 tablet    Refill:  1    Follow-up: Return in about 6 months (around 06/30/2023) for DM.    Lindell Spar, MD

## 2022-12-28 NOTE — Assessment & Plan Note (Addendum)
Recently completed Augmentin and prednisone Advised to take Mucinex or Robitussin as needed for cough now Xyzal for allergies

## 2022-12-28 NOTE — Assessment & Plan Note (Signed)
Lipid profile reviewed - improved now On Crestor now 

## 2022-12-28 NOTE — Assessment & Plan Note (Signed)
Well-controlled with Zoloft 

## 2022-12-28 NOTE — Assessment & Plan Note (Signed)
Lab Results  Component Value Date   HGBA1C 6.5 (H) 12/23/2022    Diet controlled Associated with HLD, GERD and GAD Advised to follow diabetic diet On statin F/u CMP and lipid panel Diabetic eye exam: Advised to follow up with Ophthalmology for diabetic eye exam

## 2022-12-28 NOTE — Assessment & Plan Note (Signed)
Physical exam as documented. ?Fasting blood tests ordered today. ?

## 2022-12-30 LAB — MICROALBUMIN / CREATININE URINE RATIO
Creatinine, Urine: 71.7 mg/dL
Microalb/Creat Ratio: <4 mg/g creat (ref 0–29)
Microalbumin, Urine: <3 ug/mL

## 2023-01-18 ENCOUNTER — Other Ambulatory Visit: Payer: Self-pay | Admitting: Gastroenterology

## 2023-01-18 DIAGNOSIS — K219 Gastro-esophageal reflux disease without esophagitis: Secondary | ICD-10-CM

## 2023-02-13 ENCOUNTER — Other Ambulatory Visit: Payer: Self-pay | Admitting: Gastroenterology

## 2023-02-13 DIAGNOSIS — K219 Gastro-esophageal reflux disease without esophagitis: Secondary | ICD-10-CM

## 2023-02-14 ENCOUNTER — Other Ambulatory Visit: Payer: Self-pay

## 2023-02-14 DIAGNOSIS — K219 Gastro-esophageal reflux disease without esophagitis: Secondary | ICD-10-CM

## 2023-02-14 DIAGNOSIS — E782 Mixed hyperlipidemia: Secondary | ICD-10-CM

## 2023-02-14 MED ORDER — PANTOPRAZOLE SODIUM 40 MG PO TBEC: DELAYED_RELEASE_TABLET | ORAL | 3 refills | Status: DC

## 2023-02-14 MED ORDER — ROSUVASTATIN CALCIUM 10 MG PO TABS: 10.0000 mg | ORAL_TABLET | Freq: Every day | ORAL | 5 refills | Status: DC

## 2023-04-12 ENCOUNTER — Inpatient Hospital Stay: Payer: BC Managed Care – PPO | Attending: Physician Assistant

## 2023-04-12 ENCOUNTER — Other Ambulatory Visit: Payer: BC Managed Care – PPO

## 2023-04-12 DIAGNOSIS — D751 Secondary polycythemia: Secondary | ICD-10-CM | POA: Insufficient documentation

## 2023-04-12 DIAGNOSIS — Z7982 Long term (current) use of aspirin: Secondary | ICD-10-CM | POA: Diagnosis not present

## 2023-04-12 DIAGNOSIS — G4733 Obstructive sleep apnea (adult) (pediatric): Secondary | ICD-10-CM | POA: Diagnosis not present

## 2023-04-12 LAB — CBC WITH DIFFERENTIAL/PLATELET
Abs Immature Granulocytes: 0.02 10*3/uL (ref 0.00–0.07)
Basophils Absolute: 0.1 10*3/uL (ref 0.0–0.1)
Basophils Relative: 1 %
Eosinophils Absolute: 0.4 10*3/uL (ref 0.0–0.5)
Eosinophils Relative: 5 %
HCT: 45.9 % (ref 36.0–46.0)
Hemoglobin: 14.7 g/dL (ref 12.0–15.0)
Immature Granulocytes: 0 %
Lymphocytes Relative: 35 %
Lymphs Abs: 2.7 10*3/uL (ref 0.7–4.0)
MCH: 26.1 pg (ref 26.0–34.0)
MCHC: 32 g/dL (ref 30.0–36.0)
MCV: 81.4 fL (ref 80.0–100.0)
Monocytes Absolute: 0.7 10*3/uL (ref 0.1–1.0)
Monocytes Relative: 8 %
Neutro Abs: 3.9 10*3/uL (ref 1.7–7.7)
Neutrophils Relative %: 51 %
Platelets: 236 10*3/uL (ref 150–400)
RBC: 5.64 MIL/uL — ABNORMAL HIGH (ref 3.87–5.11)
RDW: 14.7 % (ref 11.5–15.5)
WBC: 7.8 10*3/uL (ref 4.0–10.5)
nRBC: 0 % (ref 0.0–0.2)

## 2023-04-12 NOTE — Progress Notes (Signed)
VIRTUAL VISIT via TELEPHONE NOTE [place]   I connected with EMONY [name]  on 04/14/23 at  2:30 PM by telephone and verified that I am speaking with the correct person using two identifiers.  Location: Patient: Home Provider: [facility]   I discussed the limitations, risks, security and privacy concerns of performing an evaluation and management service by telephone and the availability of in person appointments. I also discussed with the patient that there may be a patient responsible charge related to this service. The patient expressed understanding and agreed to proceed.  REASON FOR VISIT:  Follow-up for erythrocytosis   PRIOR THERAPY: None   CURRENT THERAPY: Active surveillance  INTERVAL HISTORY:  [name] is contacted today for follow-up of erythrocytosis.  She was last seen by Rojelio Brenner PA-C on 10/12/2022.   She reports that she is feeling well.  She denies any hospitalizations, surgeries, or major changes in health status since her last visit.   She continues to use CPAP nightly, but sometimes cannot tolerate it for the full night.  She has not had any thrombotic events since her last visit, but has history of unprovoked PE in 2007 that was treated with warfarin x 1 year.  She has intermittent "floaters" in her left eye accompanied by occasional headaches. She denies any itching after showers or changing in the colors of her fingers.  No erythromelalgia. She denies any fevers, night sweats, or unintentional weight loss.  She denies any peripheral edema or hematuria.     She has 75% energy and 75% appetite. She endorses that she is maintaining a stable weight.  REVIEW OF SYSTEMS:   Review of Systems  Constitutional:  Negative for chills, diaphoresis, fever, malaise/fatigue and weight loss.  Respiratory:  Negative for cough and shortness of breath.   Cardiovascular:  Negative for chest pain and palpitations.  Gastrointestinal:   Negative for abdominal pain, blood in stool, melena, nausea and vomiting.  Neurological:  Negative for dizziness and headaches.     PHYSICAL EXAM: (per limitations of virtual telephone visit)  The patient is alert and oriented x 3, exhibiting adequate mentation, good mood, and ability to speak in full sentences and execute sound judgement.  ASSESSMENT & PLAN:  1.  Erythrocytosis, secondary polycythemia from OSA - Seen at the request of Dr. Allena Katz for erythrocytosis. - She had elevated RBC count since 2012.  Elevated hemoglobin from 2014 through 2022 - Hgb peaked at 17.2 (06/15/2013), has been normal since October 2022. -Most recent CBC (03/04/2022) normal Hgb 14.4, normal hematocrit 44.6, mildly elevated RBC 5.34 - She donated blood in 2022 and again in January 2023 - She is a lifelong non-smoker. - She has obstructive sleep apnea, uses CPAP regularly - was diagnosed in June 2022 - No COPD, lung disease, or known heart disease - No known carbon monoxide exposure. - History of unprovoked pulmonary embolism (03/20/2006), treated with 1 year of Coumadin. - No aquagenic pruritus/vasomotor symptoms.  She lost about 40 pounds 6 months ago switching to keto diet and exercise.  No B symptoms. - Hematology work-up (03/04/2022): Normal mid range erythropoietin 11.2 JAK2 with reflex to CALR, MPL, and Exons 12-15 was negative - No evidence of myeloproliferative neoplasm or polycythemia vera. - Most recent labs (04/12/2023): Hgb 14.7/hematocrit 45.9%. -- She takes aspirin 81 mg every other day.  Additional cardiac risk factors include high cholesterol and pre-diabetes. - Differential diagnosis favors mild secondary polycythemia/erythrocytosis in the setting of  obstructive sleep apnea.  Hemoglobin has improved since she started CPAP (June 2022) and after her blood donation in January 2023.   - PLAN:  No indication for treatment at this time.  Continue excellent use of CPAP.  We will repeat labs and discuss at  follow up visit in 1 year. - Would consider therapeutic phlebotomy if severe vasomotor symptoms or HCT >54.0. - Continue aspirin 81 mg daily. - Would allow for blood donation once every 6 months   2.  Social/family: - Lives with husband.  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  Non-smoker. - No family history of polycythemia vera.  Maternal grandfather had lung cancer.  3 maternal aunts had breast cancer. - She is up-to-date on mammograms, last one on 09/24/2021 negative.  She reports that she is due for colonoscopy this year.  PLAN SUMMARY: >> Same-day labs (CBC/D) + OFFICE visit in 1 year     I discussed the assessment and treatment plan with the patient. The patient was provided an opportunity to ask questions and all were answered. The patient agreed with the plan and demonstrated an understanding of the instructions.   The patient was advised to call back or seek an in-person evaluation if the symptoms worsen or if the condition fails to improve as anticipated.  I provided 18 minutes of non-face-to-face time during this encounter.  Carnella Guadalajara, PA-C 04/14/2023 10:06 PM

## 2023-04-14 ENCOUNTER — Inpatient Hospital Stay (HOSPITAL_BASED_OUTPATIENT_CLINIC_OR_DEPARTMENT_OTHER): Payer: BC Managed Care – PPO | Admitting: Physician Assistant

## 2023-04-14 DIAGNOSIS — D751 Secondary polycythemia: Secondary | ICD-10-CM

## 2023-05-26 ENCOUNTER — Telehealth: Payer: Self-pay | Admitting: Internal Medicine

## 2023-05-26 NOTE — Telephone Encounter (Signed)
Patient is calling says she has gotten into a nest of ticks- says she has pulled about 7 off of her and is itching real bad. Please advise pt on what to do Thank you

## 2023-05-26 NOTE — Telephone Encounter (Signed)
Patient advised.

## 2023-06-20 ENCOUNTER — Ambulatory Visit: Payer: BC Managed Care – PPO

## 2023-06-29 ENCOUNTER — Other Ambulatory Visit: Payer: Self-pay | Admitting: Internal Medicine

## 2023-06-29 DIAGNOSIS — K219 Gastro-esophageal reflux disease without esophagitis: Secondary | ICD-10-CM

## 2023-07-05 ENCOUNTER — Ambulatory Visit: Payer: BC Managed Care – PPO | Admitting: Internal Medicine

## 2023-07-15 LAB — CMP14+EGFR
ALT: 18 [IU]/L (ref 0–32)
AST: 21 [IU]/L (ref 0–40)
Albumin: 4.1 g/dL (ref 3.9–4.9)
Alkaline Phosphatase: 99 [IU]/L (ref 44–121)
BUN/Creatinine Ratio: 22 (ref 12–28)
BUN: 18 mg/dL (ref 8–27)
Bilirubin Total: 0.5 mg/dL (ref 0.0–1.2)
CO2: 20 mmol/L (ref 20–29)
Calcium: 9.3 mg/dL (ref 8.7–10.3)
Chloride: 104 mmol/L (ref 96–106)
Creatinine, Ser: 0.83 mg/dL (ref 0.57–1.00)
Globulin, Total: 2.2 g/dL (ref 1.5–4.5)
Glucose: 95 mg/dL (ref 70–99)
Potassium: 4.1 mmol/L (ref 3.5–5.2)
Sodium: 141 mmol/L (ref 134–144)
Total Protein: 6.3 g/dL (ref 6.0–8.5)
eGFR: 78 mL/min/{1.73_m2} (ref >59)

## 2023-07-15 LAB — LIPID PANEL
Chol/HDL Ratio: 3.1 {ratio} (ref 0.0–4.4)
Cholesterol, Total: 149 mg/dL (ref 100–199)
HDL: 48 mg/dL (ref >39)
LDL Chol Calc (NIH): 85 mg/dL (ref 0–99)
Triglycerides: 82 mg/dL (ref 0–149)
VLDL Cholesterol Cal: 16 mg/dL (ref 5–40)

## 2023-07-15 LAB — VITAMIN D 25 HYDROXY (VIT D DEFICIENCY, FRACTURES): Vit D, 25-Hydroxy: 70.6 ng/mL (ref 30.0–100.0)

## 2023-07-15 LAB — HEMOGLOBIN A1C
Est. average glucose Bld gHb Est-mCnc: 140 mg/dL
Hgb A1c MFr Bld: 6.5 % — ABNORMAL HIGH (ref 4.8–5.6)

## 2023-07-15 LAB — TSH: TSH: 1.4 u[IU]/mL (ref 0.450–4.500)

## 2023-07-19 ENCOUNTER — Ambulatory Visit (INDEPENDENT_AMBULATORY_CARE_PROVIDER_SITE_OTHER): Payer: Medicare Other | Admitting: Internal Medicine

## 2023-07-19 ENCOUNTER — Other Ambulatory Visit: Payer: Self-pay | Admitting: Internal Medicine

## 2023-07-19 ENCOUNTER — Encounter: Payer: Self-pay | Admitting: Internal Medicine

## 2023-07-19 VITALS — BP 110/72 | HR 64 | Ht 63.0 in | Wt 167.6 lb

## 2023-07-19 DIAGNOSIS — Z780 Asymptomatic menopausal state: Secondary | ICD-10-CM

## 2023-07-19 DIAGNOSIS — E782 Mixed hyperlipidemia: Secondary | ICD-10-CM

## 2023-07-19 DIAGNOSIS — G4733 Obstructive sleep apnea (adult) (pediatric): Secondary | ICD-10-CM

## 2023-07-19 DIAGNOSIS — Z23 Encounter for immunization: Secondary | ICD-10-CM | POA: Diagnosis not present

## 2023-07-19 DIAGNOSIS — E1169 Type 2 diabetes mellitus with other specified complication: Secondary | ICD-10-CM

## 2023-07-19 DIAGNOSIS — D751 Secondary polycythemia: Secondary | ICD-10-CM

## 2023-07-19 DIAGNOSIS — F419 Anxiety disorder, unspecified: Secondary | ICD-10-CM | POA: Diagnosis not present

## 2023-07-19 MED ORDER — ROSUVASTATIN CALCIUM 10 MG PO TABS
10.0000 mg | ORAL_TABLET | Freq: Every day | ORAL | 5 refills | Status: DC
Start: 2023-07-19 — End: 2023-08-23

## 2023-07-19 NOTE — Patient Instructions (Signed)
Please start taking half tablet of Zoloft once daily.  Please continue to take other medications as prescribed.  Please continue to follow low carb diet and perform moderate exercise/walking at least 150 mins/week.  Please get fasting blood tests done before the next visit.

## 2023-07-19 NOTE — Assessment & Plan Note (Signed)
Hb has been around 15, but had been doing blood donations as advised in the past by previous providers Hb has been up to 17 in the past Referred to Heme/Onc. for further evaluation as she has had unprovoked VTE in the past as well - visit notes reviewed Likely due to OSA

## 2023-07-19 NOTE — Progress Notes (Signed)
Established Patient Office Visit  Subjective:  Patient ID: Mallory [name], female    DOB: [birth year]  Age: 67 y.o. MRN: [id number]  CC:  Chief Complaint  Patient presents with   Diabetes    Six month follow up     HPI Mallory [name] is a 67 y.o. female with past medical history of type II DM with HLD, GERD, IBS, and GAD who presents for f/u of her chronic medical conditions.  Type II DM with HLD: Diet controlled DM.  Her HbA1c has been stable to 6.5 now.  She reports that she has been trying to follow low-carb diet recently.  She has been taking Crestor for HLD and tolerates it well.  Her lipid profile shows improvement in LDL.  Erythrocytosis: Her CBC shows Hb of 14.7.  She has seen Hematology for it.  Her work-up for primary polycythemia was negative.  Her elevated Hb is thought to be likely due to OSA.  She has not done blood donation recently.  She denies any itching or active bleeding currently.  She currently takes aspirin, but notices easy bruising.  GAD: She takes Zoloft 50 mg QD.  Denies spells of anxiety, anhedonia, SI or HI.  She is willing to decrease dose of Zoloft, as she has retired now and does not have stress related to work.  She received flu and PCV20 vaccine in the office today.  Past Medical History:  Diagnosis Date   GERD (gastroesophageal reflux disease)    HLD (hyperlipidemia)    IBS (irritable bowel syndrome)    Osteoarthritis of knee    Right; had right TKR   Pulmonary embolism (HCC)    5 yrs ago (unknown region)   Sleep apnea     Past Surgical History:  Procedure Laterality Date   CESAREAN SECTION  1991   [facility]   CESAREAN SECTION N/A    [facility] 01/16/2021   CHOLECYSTECTOMY  15 yrs ago   [facility]   COLONOSCOPY  05/24/2012   Procedure: COLONOSCOPY;  Surgeon: Malissa Hippo, MD;  Location: [facility];  Service: Endoscopy;  Laterality: N/A;  220   ERCP  08/12/2011   Procedure: ENDOSCOPIC RETROGRADE CHOLANGIOPANCREATOGRAPHY (ERCP);  Surgeon: Malissa Hippo, MD;  Location: [facility];  Service: Endoscopy;  Laterality: N/A;   ERCP W/ SPHICTEROTOMY  08/12/11   Dr Rehman-?microlithiasis, SOD   ESOPHAGOGASTRODUODENOSCOPY  11/25/2011   Procedure: ESOPHAGOGASTRODUODENOSCOPY (EGD);  Surgeon: Malissa Hippo, MD;  Location: [facility];  Service: Endoscopy;  Laterality: N/A;  830   EUS  01/19/2012   Procedure: UPPER ENDOSCOPIC ULTRASOUND (EUS) LINEAR;  Surgeon: Rachael Fee, MD;  Location: [facility];  Service: Endoscopy;  Laterality: N/A;  pt moved up a hour early by AW , Patty @ [place] office ok'd / pt was called by AW   JOINT REPLACEMENT N/A    [facility] 01/16/2021   SPHINCTEROTOMY  08/12/2011   Procedure: Dennison Mascot;  Surgeon: Malissa Hippo, MD;  Location: [facility];  Service: Endoscopy;;  with ballon passage   TOTAL KNEE ARTHROPLASTY Right 09/13/2017   TOTAL KNEE ARTHROPLASTY Right 09/13/2017   Procedure: RIGHT TOTAL KNEE ARTHROPLASTY;  Surgeon: Kathryne Hitch, MD;  Location[facility];  Service: Orthopedics;  Laterality: Right;   TUBAL LIGATION      Family History  Problem Relation Age of Onset   Diabetes Mother    Heart failure Father    Heart disease Sister    Diabetes Brother    Heart  disease Maternal Grandmother    Cancer Maternal Grandfather        ? pancreatic or lung   Anesthesia problems Neg Hx    Hypotension Neg Hx    Malignant hyperthermia Neg Hx    Pseudochol deficiency Neg Hx    Stroke Neg Hx    Colon cancer Neg Hx    Colon polyps Neg Hx    Esophageal cancer Neg Hx    Rectal cancer Neg Hx    Stomach cancer Neg Hx     Social History   Socioeconomic History   Marital status: Married    Spouse name: Not on file   Number of children: 2   Years of education: Not on file   Highest education level: 12th grade  Occupational History   Occupation: [employer]    Comment: Careers information officer  Tobacco Use   Smoking status: Never   Smokeless tobacco: Never  Vaping Use   Vaping status: Never Used  Substance and  Sexual Activity   Alcohol use: Yes    Alcohol/week: 0.0 standard drinks of alcohol    Comment: occasional wine or moonshine, but only on vacation   Drug use: No   Sexual activity: Yes    Birth control/protection: Post-menopausal  Other Topics Concern   Not on file  Social History Narrative    2 daughters      Social Determinants of Health   Financial Resource Strain: Low Risk  (07/18/2023)   Overall Financial Resource Strain (CARDIA)    Difficulty of Paying Living Expenses: Not hard at all  Food Insecurity: No Food Insecurity (07/18/2023)   Hunger Vital Sign    Worried About Running Out of Food in the Last Year: Never true    Ran Out of Food in the Last Year: Never true  Transportation Needs: No Transportation Needs (07/18/2023)   PRAPARE - Administrator, Civil Service (Medical): No    Lack of Transportation (Non-Medical): No  Physical Activity: Sufficiently Active (07/18/2023)   Exercise Vital Sign    Days of Exercise per Week: 5 days    Minutes of Exercise per Session: 30 min  Stress: No Stress Concern Present (07/18/2023)   [employer] of Occupational Health - Occupational Stress Questionnaire    Feeling of Stress : Not at all  Social Connections: Moderately Integrated (07/18/2023)   Social Connection and Isolation Panel [NHANES]    Frequency of Communication with Friends and Family: More than three times a week    Frequency of Social Gatherings with Friends and Family: More than three times a week    Attends Religious Services: 1 to 4 times per year    Active Member of [employer] or Organizations: No    Attends Engineer, structural: Not on file    Marital Status: Married  Catering manager Violence: Not on file    Outpatient Medications Prior to Visit  Medication Sig Dispense Refill   aspirin EC 81 MG tablet Take 81 mg by mouth every other day. Swallow whole.     blood glucose meter kit and supplies Dispense based on patient and insurance preference.  Use up to four times daily as directed. (FOR ICD-10 E10.9, E11.9). 1 each 0   Blood Glucose Monitoring Suppl (ACCU-CHEK GUIDE ME) w/Device KIT 1 Device by Does not apply route 4 (four) times daily. 1 kit 0   cholecalciferol (VITAMIN D3) 25 MCG (1000 UNIT) tablet Take 1,000 Units by mouth daily.     glucose blood (ACCU-CHEK  GUIDE) test strip Use as instructed 100 each 12   hyoscyamine (LEVSIN SL) 0.125 MG SL tablet TAKE 1 OR 2 TABLETS BY MOUTH EVERY 6 HOURS AS NEEDED 50 tablet 0   ibuprofen (ADVIL,MOTRIN) 200 MG tablet Take 400 mg by mouth daily as needed for mild pain or moderate pain. Pain     Microlet Lancets MISC USE TO CHECK BLOOD SUGAR FOUR TIMES DAILY AS DIRECTED 100 each 5   pantoprazole (PROTONIX) 40 MG tablet TAKE (1) TABLET BY MOUTH DAILY SHORTLY BEFORE BREAKFAST MEAL EACH DAY 30 tablet 3   sertraline (ZOLOFT) 50 MG tablet TAKE 1 TABLET BY MOUTH ONCE DAILY. 30 tablet 5   UNABLE TO FIND Home Sleep Study Kit - use as directed to detect sleep apnea Dx: D75.1; R06.83 1 each 0   Azelastine-Fluticasone 137-50 MCG/ACT SUSP Place 1 spray into the nose every 12 (twelve) hours. 23 g 1   benzonatate (TESSALON) 100 MG capsule Take 1 capsule (100 mg total) by mouth 2 (two) times daily as needed for cough. 20 capsule 0   levocetirizine (XYZAL) 5 MG tablet Take 1 tablet (5 mg total) by mouth every evening. 15 tablet 1   rosuvastatin (CRESTOR) 10 MG tablet Take 1 tablet (10 mg total) by mouth daily. 30 tablet 5   No facility-administered medications prior to visit.    Allergies  Allergen Reactions   Promethazine Hcl Hives   Phenytoin Itching   Hydromorphone Itching   Macrolides And Ketolides Itching   Nitrofurantoin Monohyd Macro Itching    ROS Review of Systems  Constitutional:  Negative for chills and fever.  HENT:  Negative for congestion, sinus pressure, sinus pain and sore throat.   Eyes:  Negative for pain and discharge.  Respiratory:  Negative for cough and shortness of breath.    Cardiovascular:  Negative for chest pain and palpitations.  Gastrointestinal:  Negative for abdominal pain, diarrhea, nausea and vomiting.  Endocrine: Negative for polydipsia and polyuria.  Genitourinary:  Negative for dysuria and hematuria.  Musculoskeletal:  Negative for neck pain and neck stiffness.  Skin:  Negative for rash.  Neurological:  Negative for dizziness and weakness.  Psychiatric/Behavioral:  Negative for agitation and behavioral problems.       Objective:    Physical Exam Vitals reviewed.  Constitutional:      General: She is not in acute distress.    Appearance: She is not diaphoretic.  HENT:     Head: Normocephalic and atraumatic.     Nose: Nose normal. No congestion.     Mouth/Throat:     Mouth: Mucous membranes are moist.     Pharynx: No posterior oropharyngeal erythema.  Eyes:     General: No scleral icterus.    Extraocular Movements: Extraocular movements intact.  Cardiovascular:     Rate and Rhythm: Normal rate and regular rhythm.     Heart sounds: Normal heart sounds. No murmur heard. Pulmonary:     Breath sounds: Normal breath sounds. No wheezing or rales.  Musculoskeletal:     Cervical back: Neck supple. No tenderness.     Right lower leg: No edema.     Left lower leg: No edema.  Skin:    General: Skin is warm.     Findings: No rash.  Neurological:     General: No focal deficit present.     Mental Status: She is alert and oriented to person, place, and time.  Psychiatric:        Mood and Affect: Mood  normal.        Behavior: Behavior normal.     BP 110/72 (BP Location: Right Arm, Patient Position: Sitting, Cuff Size: Normal)   Pulse 64   Ht 5\' 3"  (1.6 m)   Wt 167 lb 9.6 oz (76 kg)   LMP 05/11/2011   SpO2 96%   BMI 29.69 kg/m  Wt Readings from Last 3 Encounters:  07/19/23 167 lb 9.6 oz (76 kg)  12/28/22 167 lb (75.8 kg)  12/22/22 169 lb (76.7 kg)    Lab Results  Component Value Date   TSH 1.400 07/14/2023   Lab Results   Component Value Date   WBC 7.8 04/12/2023   HGB 14.7 04/12/2023   HCT 45.9 04/12/2023   MCV 81.4 04/12/2023   PLT 236 04/12/2023   Lab Results  Component Value Date   NA 141 07/14/2023   K 4.1 07/14/2023   CO2 20 07/14/2023   GLUCOSE 95 07/14/2023   BUN 18 07/14/2023   CREATININE 0.83 07/14/2023   BILITOT 0.5 07/14/2023   ALKPHOS 99 07/14/2023   AST 21 07/14/2023   ALT 18 07/14/2023   PROT 6.3 07/14/2023   ALBUMIN 4.1 07/14/2023   CALCIUM 9.3 07/14/2023   ANIONGAP 6 11/27/2020   EGFR 78 07/14/2023   GFR 75.19 01/25/2019   Lab Results  Component Value Date   CHOL 149 07/14/2023   Lab Results  Component Value Date   HDL 48 07/14/2023   Lab Results  Component Value Date   LDLCALC 85 07/14/2023   Lab Results  Component Value Date   TRIG 82 07/14/2023   Lab Results  Component Value Date   CHOLHDL 3.1 07/14/2023   Lab Results  Component Value Date   HGBA1C 6.5 (H) 07/14/2023      Assessment & Plan:   Problem List Items Addressed This Visit       Respiratory   Sleep apnea    Uses CPAP regularly, continues to benefit from it        Endocrine   Type 2 diabetes mellitus with other specified complication (HCC) - Primary    Lab Results  Component Value Date   HGBA1C 6.5 (H) 07/14/2023    Diet controlled Associated with HLD, GERD and GAD Advised to follow diabetic diet On statin F/u CMP and lipid panel Diabetic eye exam: Advised to follow up with Ophthalmology for diabetic eye exam      Relevant Medications   rosuvastatin (CRESTOR) 10 MG tablet   Other Relevant Orders   CMP14+EGFR   Hemoglobin A1c     Other   HLD (hyperlipidemia)    Lipid profile reviewed - improved now On Crestor now      Relevant Medications   rosuvastatin (CRESTOR) 10 MG tablet   Other Relevant Orders   Lipid Profile   Anxiety    Well-controlled with Zoloft 50 mg once daily As she is retired now, does not have work-related stress With shared decision making,  decreased dose of Zoloft to 25 mg QD      Erythrocytosis    Hb has been around 15, but had been doing blood donations as advised in the past by previous providers Hb has been up to 17 in the past Referred to Heme/Onc. for further evaluation as she has had unprovoked VTE in the past as well - visit notes reviewed Likely due to OSA      Relevant Orders   CBC with Differential/Platelet   Other Visit Diagnoses  Postmenopausal       Relevant Orders   DG Bone Density   Encounter for immunization       Relevant Orders   Flu Vaccine Trivalent High Dose (Fluad) (Completed)   Pneumococcal conjugate vaccine 20-valent (Completed)        Meds ordered this encounter  Medications   rosuvastatin (CRESTOR) 10 MG tablet    Sig: Take 1 tablet (10 mg total) by mouth daily.    Dispense:  30 tablet    Refill:  5    Follow-up: Return in about 6 months (around 01/17/2024) for DM.    Anabel Halon, MD

## 2023-07-19 NOTE — Assessment & Plan Note (Signed)
Uses CPAP regularly, continues to benefit from it 

## 2023-07-19 NOTE — Assessment & Plan Note (Signed)
Well-controlled with Zoloft 50 mg once daily As she is retired now, does not have work-related stress With shared decision making, decreased dose of Zoloft to 25 mg QD

## 2023-07-19 NOTE — Assessment & Plan Note (Signed)
Lipid profile reviewed - improved now On Crestor now 

## 2023-07-19 NOTE — Assessment & Plan Note (Signed)
Lab Results  Component Value Date   HGBA1C 6.5 (H) 07/14/2023    Diet controlled Associated with HLD, GERD and GAD Advised to follow diabetic diet On statin F/u CMP and lipid panel Diabetic eye exam: Advised to follow up with Ophthalmology for diabetic eye exam

## 2023-08-04 ENCOUNTER — Other Ambulatory Visit: Payer: Self-pay

## 2023-08-04 DIAGNOSIS — K219 Gastro-esophageal reflux disease without esophagitis: Secondary | ICD-10-CM

## 2023-08-04 MED ORDER — PANTOPRAZOLE SODIUM 40 MG PO TBEC: DELAYED_RELEASE_TABLET | ORAL | 3 refills | Status: DC

## 2023-08-23 ENCOUNTER — Other Ambulatory Visit: Payer: Self-pay

## 2023-08-23 DIAGNOSIS — F419 Anxiety disorder, unspecified: Secondary | ICD-10-CM

## 2023-08-23 DIAGNOSIS — E782 Mixed hyperlipidemia: Secondary | ICD-10-CM

## 2023-08-23 MED ORDER — ROSUVASTATIN CALCIUM 10 MG PO TABS: 10.0000 mg | ORAL_TABLET | Freq: Every day | ORAL | 5 refills | Status: DC

## 2023-08-23 MED ORDER — SERTRALINE HCL 50 MG PO TABS: 50.0000 mg | ORAL_TABLET | Freq: Every day | ORAL | 5 refills | Status: DC

## 2023-10-05 LAB — HM MAMMOGRAPHY

## 2023-11-22 ENCOUNTER — Encounter: Payer: Self-pay | Admitting: Internal Medicine

## 2023-11-22 ENCOUNTER — Ambulatory Visit (INDEPENDENT_AMBULATORY_CARE_PROVIDER_SITE_OTHER): Payer: Medicare Other | Admitting: Internal Medicine

## 2023-11-22 VITALS — BP 126/77 | HR 63 | Ht 63.0 in | Wt 163.6 lb

## 2023-11-22 DIAGNOSIS — J209 Acute bronchitis, unspecified: Secondary | ICD-10-CM | POA: Diagnosis not present

## 2023-11-22 MED ORDER — METHYLPREDNISOLONE ACETATE 80 MG/ML IJ SUSP
80.0000 mg | Freq: Once | INTRAMUSCULAR | Status: AC
  Administered 2023-11-22: 40 mg via INTRAMUSCULAR

## 2023-11-22 MED ORDER — AMOXICILLIN-POT CLAVULANATE 875-125 MG PO TABS: 1.0000 | ORAL_TABLET | Freq: Two times a day (BID) | ORAL | 0 refills | Status: DC

## 2023-11-22 MED ORDER — GUAIFENESIN-CODEINE 100-10 MG/5ML PO SOLN: 5.0000 mL | Freq: Three times a day (TID) | ORAL | 0 refills | Status: DC | PRN

## 2023-11-22 NOTE — Progress Notes (Signed)
Acute Office Visit  Subjective:    Patient ID: Mallory [name], female    DOB: 10-23-1955, 68 y.o.   MRN: [id number]  Chief Complaint  Patient presents with   URI    Cough, congestion, heavy chest feeling, sore throat for one week     HPI Patient is in today for complaint of cough with clear expectoration, chest congestion and mild dyspnea for the last 1 week.  She initially had nasal congestion, postnasal drip, sore throat and fever for 2 days, which have resolved now.  Her husband also had similar symptoms.  She has tried taking Mucinex and Robitussin DM as needed for cough with mild relief.  Past Medical History:  Diagnosis Date   GERD (gastroesophageal reflux disease)    HLD (hyperlipidemia)    IBS (irritable bowel syndrome)    Osteoarthritis of knee    Right; had right TKR   Pulmonary embolism (HCC)    5 yrs ago (unknown region)   Sleep apnea     Past Surgical History:  Procedure Laterality Date   CESAREAN SECTION  1991   mc   CESAREAN SECTION N/A    Phreesia 01/16/2021   CHOLECYSTECTOMY  15 yrs ago   mc   COLONOSCOPY  05/24/2012   Procedure: COLONOSCOPY;  Surgeon: Malissa Hippo, MD;  [facility];  Service: Endoscopy;  Laterality: N/A;  220   ERCP  08/12/2011   Procedure: ENDOSCOPIC RETROGRADE CHOLANGIOPANCREATOGRAPHY (ERCP);  Surgeon: Malissa Hippo, MD;  Location: [facility];  Service: Endoscopy;  Laterality: N/A;   ERCP W/ SPHICTEROTOMY  08/12/11   Dr Rehman-?microlithiasis, SOD   ESOPHAGOGASTRODUODENOSCOPY  11/25/2011   Procedure: ESOPHAGOGASTRODUODENOSCOPY (EGD);  Surgeon: Malissa Hippo, MD;  [facility];  Service: Endoscopy;  Laterality: N/A;  830   EUS  01/19/2012   Procedure: UPPER ENDOSCOPIC ULTRASOUND (EUS) LINEAR;  Surgeon: Rachael Fee, MD;  Location: [facility];  Service: Endoscopy;  Laterality: N/A;  pt moved up a hour early by AW , Patty @ [place] office ok'd / pt was called by AW   JOINT REPLACEMENT N/A    Phreesia 01/16/2021    SPHINCTEROTOMY  08/12/2011   Procedure: Dennison Mascot;  Surgeon: Malissa Hippo, MD;  Location: [facility];  Service: Endoscopy;;  with ballon passage   TOTAL KNEE ARTHROPLASTY Right 09/13/2017   TOTAL KNEE ARTHROPLASTY Right 09/13/2017   Procedure: RIGHT TOTAL KNEE ARTHROPLASTY;  Surgeon: Kathryne Hitch, MD;  Location: [facility] OR;  Service: Orthopedics;  Laterality: Right;   TUBAL LIGATION      Family History  Problem Relation Age of Onset   Diabetes Mother    Heart failure Father    Heart disease Sister    Diabetes Brother    Heart disease Maternal Grandmother    Cancer Maternal Grandfather        ? pancreatic or lung   Anesthesia problems Neg Hx    Hypotension Neg Hx    Malignant hyperthermia Neg Hx    Pseudochol deficiency Neg Hx    Stroke Neg Hx    Colon cancer Neg Hx    Colon polyps Neg Hx    Esophageal cancer Neg Hx    Rectal cancer Neg Hx    Stomach cancer Neg Hx     Social History   Socioeconomic History   Marital status: Married    Spouse name: Not on file   Number of children: 2   Years of education: Not on file  Highest education level: 12th grade  Occupational History   Occupation: [employer]    Comment: Careers information officer  Tobacco Use   Smoking status: Never   Smokeless tobacco: Never  Vaping Use   Vaping status: Never Used  Substance and Sexual Activity   Alcohol use: Yes    Alcohol/week: 0.0 standard drinks of alcohol    Comment: occasional wine or moonshine, but only on vacation   Drug use: No   Sexual activity: Yes    Birth control/protection: Post-menopausal  Other Topics Concern   Not on file  Social History Narrative    2 daughters      Social Drivers of Corporate investment banker Strain: Low Risk  (07/18/2023)   Overall Financial Resource Strain (CARDIA)    Difficulty of Paying Living Expenses: Not hard at all  Food Insecurity: No Food Insecurity (07/18/2023)   Hunger Vital Sign    Worried About Running Out of Food in the Last Year:  Never true    Ran Out of Food in the Last Year: Never true  Transportation Needs: No Transportation Needs (07/18/2023)   PRAPARE - Administrator, Civil Service (Medical): No    Lack of Transportation (Non-Medical): No  Physical Activity: Sufficiently Active (07/18/2023)   Exercise Vital Sign    Days of Exercise per Week: 5 days    Minutes of Exercise per Session: 30 min  Stress: No Stress Concern Present (07/18/2023)   [employer] of Occupational Health - Occupational Stress Questionnaire    Feeling of Stress : Not at all  Social Connections: Moderately Integrated (07/18/2023)   Social Connection and Isolation Panel [NHANES]    Frequency of Communication with Friends and Family: More than three times a week    Frequency of Social Gatherings with Friends and Family: More than three times a week    Attends Religious Services: 1 to 4 times per year    Active Member of [employer] or Organizations: No    Attends Engineer, structural: Not on file    Marital Status: Married  Catering manager Violence: Not on file    Outpatient Medications Prior to Visit  Medication Sig Dispense Refill   aspirin EC 81 MG tablet Take 81 mg by mouth every other day. Swallow whole.     blood glucose meter kit and supplies Dispense based on patient and insurance preference. Use up to four times daily as directed. (FOR ICD-10 E10.9, E11.9). 1 each 0   Blood Glucose Monitoring Suppl (ACCU-CHEK GUIDE ME) w/Device KIT 1 Device by Does not apply route 4 (four) times daily. 1 kit 0   cholecalciferol (VITAMIN D3) 25 MCG (1000 UNIT) tablet Take 1,000 Units by mouth daily.     glucose blood (ACCU-CHEK GUIDE) test strip Use as instructed 100 each 12   hyoscyamine (LEVSIN SL) 0.125 MG SL tablet TAKE 1 OR 2 TABLETS BY MOUTH EVERY 6 HOURS AS NEEDED 50 tablet 0   ibuprofen (ADVIL,MOTRIN) 200 MG tablet Take 400 mg by mouth daily as needed for mild pain or moderate pain. Pain     Microlet Lancets MISC USE TO  CHECK BLOOD SUGAR FOUR TIMES DAILY AS DIRECTED 100 each 5   pantoprazole (PROTONIX) 40 MG tablet TAKE (1) TABLET BY MOUTH DAILY SHORTLY BEFORE BREAKFAST MEAL EACH DAY 30 tablet 3   rosuvastatin (CRESTOR) 10 MG tablet Take 1 tablet (10 mg total) by mouth daily. 30 tablet 5   sertraline (ZOLOFT) 50 MG tablet Take 1 tablet (  50 mg total) by mouth daily. 30 tablet 5   UNABLE TO FIND Home Sleep Study Kit - use as directed to detect sleep apnea Dx: D75.1; R06.83 1 each 0   No facility-administered medications prior to visit.    Allergies  Allergen Reactions   Promethazine Hcl Hives   Phenytoin Itching   Hydromorphone Itching   Macrolides And Ketolides Itching   Nitrofurantoin Monohyd Macro Itching    Review of Systems  Constitutional:  Negative for chills and fever.  HENT:  Positive for congestion and sore throat.   Eyes:  Negative for pain and discharge.  Respiratory:  Positive for cough, chest tightness and shortness of breath.   Cardiovascular:  Negative for palpitations and leg swelling.  Gastrointestinal:  Negative for abdominal pain, diarrhea, nausea and vomiting.  Endocrine: Negative for polydipsia and polyuria.  Genitourinary:  Negative for dysuria and hematuria.  Musculoskeletal:  Negative for neck pain and neck stiffness.  Skin:  Negative for rash.  Neurological:  Negative for dizziness and weakness.  Psychiatric/Behavioral:  Negative for agitation and behavioral problems.        Objective:    Physical Exam Vitals reviewed.  Constitutional:      General: She is not in acute distress.    Appearance: She is not diaphoretic.  HENT:     Head: Normocephalic and atraumatic.     Nose: Congestion present.     Mouth/Throat:     Mouth: Mucous membranes are moist.     Pharynx: No posterior oropharyngeal erythema.  Eyes:     General: No scleral icterus.    Extraocular Movements: Extraocular movements intact.  Cardiovascular:     Rate and Rhythm: Normal rate and regular  rhythm.     Heart sounds: Normal heart sounds. No murmur heard. Pulmonary:     Breath sounds: No wheezing or rales.  Musculoskeletal:     Cervical back: Neck supple. No tenderness.     Right lower leg: No edema.     Left lower leg: No edema.  Skin:    General: Skin is warm.     Findings: No rash.  Neurological:     General: No focal deficit present.     Mental Status: She is alert and oriented to person, place, and time.  Psychiatric:        Mood and Affect: Mood normal.        Behavior: Behavior normal.     BP 126/77 (BP Location: Right Arm, Patient Position: Sitting, Cuff Size: Normal)   Pulse 63   Ht 5\' 3"  (1.6 m)   Wt 163 lb 9.6 oz (74.2 kg)   LMP 05/11/2011   SpO2 93%   BMI 28.98 kg/m  Wt Readings from Last 3 Encounters:  11/22/23 163 lb 9.6 oz (74.2 kg)  07/19/23 167 lb 9.6 oz (76 kg)  12/28/22 167 lb (75.8 kg)        Assessment & Plan:   Problem List Items Addressed This Visit       Respiratory   Acute bronchitis - Primary   Started Augmentin considering persistent symptoms despite symptomatic treatment DepoMedrol 40 mg IM today considering dyspnea Guaifenesin-codeine syrup as needed for cough now Post-infectious cough can last for up to 6-8 weeks - can take Mucinex or Robitussin DM PRN later Xyzal for allergies      Relevant Medications   amoxicillin-clavulanate (AUGMENTIN) 875-125 MG tablet   guaiFENesin-codeine 100-10 MG/5ML syrup     Meds ordered this encounter  Medications  amoxicillin-clavulanate (AUGMENTIN) 875-125 MG tablet    Sig: Take 1 tablet by mouth 2 (two) times daily.    Dispense:  14 tablet    Refill:  0   guaiFENesin-codeine 100-10 MG/5ML syrup    Sig: Take 5 mLs by mouth 3 (three) times daily as needed for cough.    Dispense:  120 mL    Refill:  0   methylPREDNISolone acetate (DEPO-MEDROL) injection 80 mg     Anabel Halon, MD

## 2023-11-22 NOTE — Assessment & Plan Note (Addendum)
Started Augmentin considering persistent symptoms despite symptomatic treatment DepoMedrol 40 mg IM today considering dyspnea Guaifenesin-codeine syrup as needed for cough now Post-infectious cough can last for up to 6-8 weeks - can take Mucinex or Robitussin DM PRN later Xyzal for allergies

## 2023-11-22 NOTE — Patient Instructions (Signed)
Please start taking Augmentin as prescribed.  Please start taking Guaifenesin-codeine as needed for cough.

## 2023-12-28 ENCOUNTER — Other Ambulatory Visit: Payer: Self-pay | Admitting: Internal Medicine

## 2023-12-28 DIAGNOSIS — K219 Gastro-esophageal reflux disease without esophagitis: Secondary | ICD-10-CM

## 2024-01-13 LAB — CBC WITH DIFFERENTIAL/PLATELET
Basophils Absolute: 0.1 10*3/uL (ref 0.0–0.2)
Basos: 1 %
EOS (ABSOLUTE): 0.2 10*3/uL (ref 0.0–0.4)
Eos: 3 %
Hematocrit: 46.9 % — ABNORMAL HIGH (ref 34.0–46.6)
Hemoglobin: 15.3 g/dL (ref 11.1–15.9)
Immature Grans (Abs): 0 10*3/uL (ref 0.0–0.1)
Immature Granulocytes: 0 %
Lymphocytes Absolute: 2 10*3/uL (ref 0.7–3.1)
Lymphs: 34 %
MCH: 27.4 pg (ref 26.6–33.0)
MCHC: 32.6 g/dL (ref 31.5–35.7)
MCV: 84 fL (ref 79–97)
Monocytes Absolute: 0.5 10*3/uL (ref 0.1–0.9)
Monocytes: 8 %
Neutrophils Absolute: 3.1 10*3/uL (ref 1.4–7.0)
Neutrophils: 54 %
Platelets: 210 10*3/uL (ref 150–450)
RBC: 5.58 x10E6/uL — ABNORMAL HIGH (ref 3.77–5.28)
RDW: 14.4 % (ref 11.7–15.4)
WBC: 5.7 10*3/uL (ref 3.4–10.8)

## 2024-01-13 LAB — CMP14+EGFR
ALT: 29 IU/L (ref 0–32)
AST: 25 IU/L (ref 0–40)
Albumin: 4.2 g/dL (ref 3.9–4.9)
Alkaline Phosphatase: 117 IU/L (ref 44–121)
BUN/Creatinine Ratio: 21 (ref 12–28)
BUN: 17 mg/dL (ref 8–27)
Bilirubin Total: 0.4 mg/dL (ref 0.0–1.2)
CO2: 21 mmol/L (ref 20–29)
Calcium: 9.4 mg/dL (ref 8.7–10.3)
Chloride: 105 mmol/L (ref 96–106)
Creatinine, Ser: 0.81 mg/dL (ref 0.57–1.00)
Globulin, Total: 2.2 g/dL (ref 1.5–4.5)
Glucose: 94 mg/dL (ref 70–99)
Potassium: 4.2 mmol/L (ref 3.5–5.2)
Sodium: 143 mmol/L (ref 134–144)
Total Protein: 6.4 g/dL (ref 6.0–8.5)
eGFR: 81 mL/min/{1.73_m2} (ref >59)

## 2024-01-13 LAB — LIPID PANEL
Chol/HDL Ratio: 3 ratio (ref 0.0–4.4)
Cholesterol, Total: 152 mg/dL (ref 100–199)
HDL: 51 mg/dL (ref >39)
LDL Chol Calc (NIH): 84 mg/dL (ref 0–99)
Triglycerides: 88 mg/dL (ref 0–149)
VLDL Cholesterol Cal: 17 mg/dL (ref 5–40)

## 2024-01-13 LAB — HEMOGLOBIN A1C
Est. average glucose Bld gHb Est-mCnc: 137 mg/dL
Hgb A1c MFr Bld: 6.4 % — ABNORMAL HIGH (ref 4.8–5.6)

## 2024-01-17 ENCOUNTER — Ambulatory Visit: Payer: BC Managed Care – PPO | Admitting: Internal Medicine

## 2024-01-18 ENCOUNTER — Ambulatory Visit (INDEPENDENT_AMBULATORY_CARE_PROVIDER_SITE_OTHER): Admitting: Internal Medicine

## 2024-01-18 ENCOUNTER — Encounter: Payer: Self-pay | Admitting: Internal Medicine

## 2024-01-18 VITALS — BP 110/71 | HR 58 | Ht 63.0 in | Wt 162.4 lb

## 2024-01-18 DIAGNOSIS — F419 Anxiety disorder, unspecified: Secondary | ICD-10-CM | POA: Diagnosis not present

## 2024-01-18 DIAGNOSIS — D751 Secondary polycythemia: Secondary | ICD-10-CM

## 2024-01-18 DIAGNOSIS — G4733 Obstructive sleep apnea (adult) (pediatric): Secondary | ICD-10-CM | POA: Diagnosis not present

## 2024-01-18 DIAGNOSIS — K219 Gastro-esophageal reflux disease without esophagitis: Secondary | ICD-10-CM

## 2024-01-18 DIAGNOSIS — E1169 Type 2 diabetes mellitus with other specified complication: Secondary | ICD-10-CM | POA: Diagnosis not present

## 2024-01-18 DIAGNOSIS — E782 Mixed hyperlipidemia: Secondary | ICD-10-CM

## 2024-01-18 MED ORDER — PANTOPRAZOLE SODIUM 40 MG PO TBEC: 40.0000 mg | DELAYED_RELEASE_TABLET | Freq: Every day | ORAL | 3 refills | Status: DC

## 2024-01-18 MED ORDER — MICROLET LANCETS MISC: 5 refills | Status: AC

## 2024-01-18 MED ORDER — SERTRALINE HCL 50 MG PO TABS: 50.0000 mg | ORAL_TABLET | Freq: Every day | ORAL | 3 refills | Status: DC

## 2024-01-18 MED ORDER — ACCU-CHEK GUIDE TEST VI STRP: ORAL_STRIP | 12 refills | Status: DC

## 2024-01-18 MED ORDER — ROSUVASTATIN CALCIUM 10 MG PO TABS: 10.0000 mg | ORAL_TABLET | Freq: Every day | ORAL | 3 refills | Status: DC

## 2024-01-18 NOTE — Assessment & Plan Note (Signed)
 Lab Results  Component Value Date   HGBA1C 6.4 (H) 01/12/2024    Diet controlled Associated with HLD, GERD and GAD Advised to follow diabetic diet On statin F/u CMP and lipid panel Diabetic eye exam: Advised to follow up with Ophthalmology for diabetic eye exam

## 2024-01-18 NOTE — Progress Notes (Signed)
 Established Patient Office Visit  Subjective:  Patient ID: Mallory [name], female    DOB: [birth year]  Age: 68 y.o. MRN: [id number]  CC:  Chief Complaint  Patient presents with   Care Management    Follow up    HPI Mallory [name] is a 68 y.o. female with past medical history of type II DM with HLD, GERD, IBS, and GAD who presents for f/u of her chronic medical conditions.  Type II DM with HLD: Diet controlled DM.  Her HbA1c has been stable to 6.4 now.  She reports that she has been trying to follow low-carb diet.  She has been taking Crestor for HLD and tolerates it well.  Her lipid profile shows improvement in LDL.  Erythrocytosis: Her CBC shows Hb of 15.3.  She has seen Hematology for it.  Her work-up for primary polycythemia was negative.  Her elevated Hb is thought to be likely due to OSA.  She has not done blood donation recently.  She denies any itching or active bleeding currently.  She currently takes aspirin, but notices easy bruising.  GAD: She takes Zoloft 50 mg QD.  Denies spells of anxiety, anhedonia, SI or HI.  She tried to decrease dose of Zoloft, as she has retired now, but had to go back to 50 mg dose due to worsening of anxiety with 25 mg dose.    Past Medical History:  Diagnosis Date   GERD (gastroesophageal reflux disease)    HLD (hyperlipidemia)    IBS (irritable bowel syndrome)    Osteoarthritis of knee    Right; had right TKR   Pulmonary embolism (HCC)    5 yrs ago (unknown region)   Sleep apnea     Past Surgical History:  Procedure Laterality Date   CESAREAN SECTION  1991   mc   CESAREAN SECTION N/A    Phreesia 01/16/2021   CHOLECYSTECTOMY  15 yrs ago   mc   COLONOSCOPY  05/24/2012   Procedure: COLONOSCOPY;  Surgeon: Malissa Hippo, MD;  [facility];  Service: Endoscopy;  Laterality: N/A;  220   ERCP  08/12/2011   Procedure: ENDOSCOPIC RETROGRADE CHOLANGIOPANCREATOGRAPHY (ERCP);  Surgeon: Malissa Hippo, MD;  Location: [facility];  Service:  Endoscopy;  Laterality: N/A;   ERCP W/ SPHICTEROTOMY  08/12/11   Dr Rehman-?microlithiasis, SOD   ESOPHAGOGASTRODUODENOSCOPY  11/25/2011   Procedure: ESOPHAGOGASTRODUODENOSCOPY (EGD);  Surgeon: Malissa Hippo, MD;  [facility];  Service: Endoscopy;  Laterality: N/A;  830   EUS  01/19/2012   Procedure: UPPER ENDOSCOPIC ULTRASOUND (EUS) LINEAR;  Surgeon: Rachael Fee, MD;  Location: [facility];  Service: Endoscopy;  Laterality: N/A;  pt moved up a hour early by AW , Patty @ [place] office ok'd / pt was called by AW   JOINT REPLACEMENT N/A    Phreesia 01/16/2021   SPHINCTEROTOMY  08/12/2011   Procedure: Dennison Mascot;  Surgeon: Malissa Hippo, MD;  Location: [facility];  Service: Endoscopy;;  with ballon passage   TOTAL KNEE ARTHROPLASTY Right 09/13/2017   TOTAL KNEE ARTHROPLASTY Right 09/13/2017   Procedure: RIGHT TOTAL KNEE ARTHROPLASTY;  Surgeon: Kathryne Hitch, MD;  Location: [facility] OR;  Service: Orthopedics;  Laterality: Right;   TUBAL LIGATION      Family History  Problem Relation Age of Onset   Diabetes Mother    Heart failure Father    Heart disease Sister    Diabetes Brother    Heart disease Maternal Grandmother  Cancer Maternal Grandfather        ? pancreatic or lung   Anesthesia problems Neg Hx    Hypotension Neg Hx    Malignant hyperthermia Neg Hx    Pseudochol deficiency Neg Hx    Stroke Neg Hx    Colon cancer Neg Hx    Colon polyps Neg Hx    Esophageal cancer Neg Hx    Rectal cancer Neg Hx    Stomach cancer Neg Hx     Social History   Socioeconomic History   Marital status: Married    Spouse name: Not on file   Number of children: 2   Years of education: Not on file   Highest education level: 12th grade  Occupational History   Occupation: [employer]    Comment: Careers information officer  Tobacco Use   Smoking status: Never   Smokeless tobacco: Never  Vaping Use   Vaping status: Never Used  Substance and Sexual Activity   Alcohol use: Yes     Alcohol/week: 0.0 standard drinks of alcohol    Comment: occasional wine or moonshine, but only on vacation   Drug use: No   Sexual activity: Yes    Birth control/protection: Post-menopausal  Other Topics Concern   Not on file  Social History Narrative    2 daughters      Social Drivers of Corporate investment banker Strain: Low Risk  (01/17/2024)   Overall Financial Resource Strain (CARDIA)    Difficulty of Paying Living Expenses: Not hard at all  Food Insecurity: No Food Insecurity (01/17/2024)   Hunger Vital Sign    Worried About Running Out of Food in the Last Year: Never true    Ran Out of Food in the Last Year: Never true  Transportation Needs: No Transportation Needs (01/17/2024)   PRAPARE - Administrator, Civil Service (Medical): No    Lack of Transportation (Non-Medical): No  Physical Activity: Insufficiently Active (01/17/2024)   Exercise Vital Sign    Days of Exercise per Week: 4 days    Minutes of Exercise per Session: 20 min  Stress: No Stress Concern Present (01/17/2024)   [employer] of Occupational Health - Occupational Stress Questionnaire    Feeling of Stress : Not at all  Social Connections: Socially Integrated (01/17/2024)   Social Connection and Isolation Panel [NHANES]    Frequency of Communication with Friends and Family: More than three times a week    Frequency of Social Gatherings with Friends and Family: More than three times a week    Attends Religious Services: More than 4 times per year    Active Member of [employer] or Organizations: Yes    Attends Banker Meetings: 1 to 4 times per year    Marital Status: Married  Catering manager Violence: Not on file    Outpatient Medications Prior to Visit  Medication Sig Dispense Refill   aspirin EC 81 MG tablet Take 81 mg by mouth every other day. Swallow whole.     blood glucose meter kit and supplies Dispense based on patient and insurance preference. Use up to four times daily as  directed. (FOR ICD-10 E10.9, E11.9). 1 each 0   Blood Glucose Monitoring Suppl (ACCU-CHEK GUIDE ME) w/Device KIT 1 Device by Does not apply route 4 (four) times daily. 1 kit 0   cholecalciferol (VITAMIN D3) 25 MCG (1000 UNIT) tablet Take 1,000 Units by mouth daily.     hyoscyamine (LEVSIN SL) 0.125 MG SL  tablet TAKE 1 OR 2 TABLETS BY MOUTH EVERY 6 HOURS AS NEEDED 50 tablet 0   ibuprofen (ADVIL,MOTRIN) 200 MG tablet Take 400 mg by mouth daily as needed for mild pain or moderate pain. Pain     UNABLE TO FIND Home Sleep Study Kit - use as directed to detect sleep apnea Dx: D75.1; R06.83 1 each 0   amoxicillin-clavulanate (AUGMENTIN) 875-125 MG tablet Take 1 tablet by mouth 2 (two) times daily. 14 tablet 0   glucose blood (ACCU-CHEK GUIDE) test strip Use as instructed 100 each 12   guaiFENesin-codeine 100-10 MG/5ML syrup Take 5 mLs by mouth 3 (three) times daily as needed for cough. 120 mL 0   Microlet Lancets MISC USE TO CHECK BLOOD SUGAR FOUR TIMES DAILY AS DIRECTED 100 each 5   pantoprazole (PROTONIX) 40 MG tablet TAKE 1 TABLET BY MOUTH DAILY BEFORE BREAKFAST 30 tablet 3   rosuvastatin (CRESTOR) 10 MG tablet Take 1 tablet (10 mg total) by mouth daily. 30 tablet 5   sertraline (ZOLOFT) 50 MG tablet Take 1 tablet (50 mg total) by mouth daily. 30 tablet 5   No facility-administered medications prior to visit.    Allergies  Allergen Reactions   Promethazine Hcl Hives   Phenytoin Itching   Hydromorphone Itching   Macrolides And Ketolides Itching   Nitrofurantoin Monohyd Macro Itching    ROS Review of Systems  Constitutional:  Negative for chills and fever.  HENT:  Negative for congestion, sinus pressure, sinus pain and sore throat.   Eyes:  Negative for pain and discharge.  Respiratory:  Negative for cough and shortness of breath.   Cardiovascular:  Negative for chest pain and palpitations.  Gastrointestinal:  Negative for abdominal pain, diarrhea, nausea and vomiting.  Endocrine:  Negative for polydipsia and polyuria.  Genitourinary:  Negative for dysuria and hematuria.  Musculoskeletal:  Negative for neck pain and neck stiffness.  Skin:  Negative for rash.  Neurological:  Negative for dizziness and weakness.  Psychiatric/Behavioral:  Negative for agitation and behavioral problems.       Objective:    Physical Exam Vitals reviewed.  Constitutional:      General: She is not in acute distress.    Appearance: She is not diaphoretic.  HENT:     Head: Normocephalic and atraumatic.     Nose: Nose normal. No congestion.     Mouth/Throat:     Mouth: Mucous membranes are moist.     Pharynx: No posterior oropharyngeal erythema.  Eyes:     General: No scleral icterus.    Extraocular Movements: Extraocular movements intact.  Cardiovascular:     Rate and Rhythm: Normal rate and regular rhythm.     Heart sounds: Normal heart sounds. No murmur heard. Pulmonary:     Breath sounds: Normal breath sounds. No wheezing or rales.  Musculoskeletal:     Cervical back: Neck supple. No tenderness.     Right lower leg: No edema.     Left lower leg: No edema.  Skin:    General: Skin is warm.     Findings: No rash.  Neurological:     General: No focal deficit present.     Mental Status: She is alert and oriented to person, place, and time.  Psychiatric:        Mood and Affect: Mood normal.        Behavior: Behavior normal.     BP 110/71   Pulse (!) 58   Ht 5\' 3"  (1.6 m)  Wt 162 lb 6.4 oz (73.7 kg)   LMP 05/11/2011   SpO2 95%   BMI 28.77 kg/m  Wt Readings from Last 3 Encounters:  01/18/24 162 lb 6.4 oz (73.7 kg)  11/22/23 163 lb 9.6 oz (74.2 kg)  07/19/23 167 lb 9.6 oz (76 kg)    Lab Results  Component Value Date   TSH 1.400 07/14/2023   Lab Results  Component Value Date   WBC 5.7 01/12/2024   HGB 15.3 01/12/2024   HCT 46.9 (H) 01/12/2024   MCV 84 01/12/2024   PLT 210 01/12/2024   Lab Results  Component Value Date   NA 143 01/12/2024   K 4.2  01/12/2024   CO2 21 01/12/2024   GLUCOSE 94 01/12/2024   BUN 17 01/12/2024   CREATININE 0.81 01/12/2024   BILITOT 0.4 01/12/2024   ALKPHOS 117 01/12/2024   AST 25 01/12/2024   ALT 29 01/12/2024   PROT 6.4 01/12/2024   ALBUMIN 4.2 01/12/2024   CALCIUM 9.4 01/12/2024   ANIONGAP 6 11/27/2020   EGFR 81 01/12/2024   GFR 75.19 01/25/2019   Lab Results  Component Value Date   CHOL 152 01/12/2024   Lab Results  Component Value Date   HDL 51 01/12/2024   Lab Results  Component Value Date   LDLCALC 84 01/12/2024   Lab Results  Component Value Date   TRIG 88 01/12/2024   Lab Results  Component Value Date   CHOLHDL 3.0 01/12/2024   Lab Results  Component Value Date   HGBA1C 6.4 (H) 01/12/2024      Assessment & Plan:   Problem List Items Addressed This Visit       Respiratory   Sleep apnea   Uses CPAP regularly, continues to benefit from it        Endocrine   Type 2 diabetes mellitus with other specified complication (HCC) - Primary   Lab Results  Component Value Date   HGBA1C 6.4 (H) 01/12/2024    Diet controlled Associated with HLD, GERD and GAD Advised to follow diabetic diet On statin F/u CMP and lipid panel Diabetic eye exam: Advised to follow up with Ophthalmology for diabetic eye exam      Relevant Medications   Microlet Lancets MISC   glucose blood (ACCU-CHEK GUIDE TEST) test strip   rosuvastatin (CRESTOR) 10 MG tablet   Other Relevant Orders   Microalbumin / creatinine urine ratio   CMP14+EGFR   Hemoglobin A1c     Other   HLD (hyperlipidemia)   Lipid profile reviewed - improved now On Crestor now      Relevant Medications   rosuvastatin (CRESTOR) 10 MG tablet   Other Relevant Orders   Lipid Profile   Anxiety   Well-controlled with Zoloft 50 mg once daily As she is retired now, does not have work-related stress With shared decision making, had decreased dose of Zoloft to 25 mg once daily, but later had to increase back to 50 mg due  to worsening of anxiety      Relevant Medications   sertraline (ZOLOFT) 50 MG tablet   Erythrocytosis   Hb has been around 15, but had been doing blood donations as advised in the past by previous providers Hb has been up to 17 in the past Referred to Heme/Onc. for further evaluation as she has had unprovoked VTE in the past as well - visit notes reviewed Likely due to OSA      Other Visit Diagnoses  Gastroesophageal reflux disease       Relevant Medications   pantoprazole (PROTONIX) 40 MG tablet        Meds ordered this encounter  Medications   Microlet Lancets MISC    Sig: USE TO CHECK BLOOD SUGAR FOUR TIMES DAILY AS DIRECTED    Dispense:  100 each    Refill:  5    Okay to change to generic alternative   glucose blood (ACCU-CHEK GUIDE TEST) test strip    Sig: Use as instructed    Dispense:  100 each    Refill:  12   rosuvastatin (CRESTOR) 10 MG tablet    Sig: Take 1 tablet (10 mg total) by mouth daily.    Dispense:  90 tablet    Refill:  3   pantoprazole (PROTONIX) 40 MG tablet    Sig: Take 1 tablet (40 mg total) by mouth daily before breakfast.    Dispense:  90 tablet    Refill:  3   sertraline (ZOLOFT) 50 MG tablet    Sig: Take 1 tablet (50 mg total) by mouth daily.    Dispense:  90 tablet    Refill:  3    Follow-up: Return in about 6 months (around 07/19/2024) for DM.    Anabel Halon, MD

## 2024-01-18 NOTE — Assessment & Plan Note (Signed)
Uses CPAP regularly, continues to benefit from it 

## 2024-01-18 NOTE — Assessment & Plan Note (Signed)
Lipid profile reviewed - improved now On Crestor now 

## 2024-01-18 NOTE — Assessment & Plan Note (Addendum)
 Well-controlled with Zoloft 50 mg once daily As she is retired now, does not have work-related stress With shared decision making, had decreased dose of Zoloft to 25 mg once daily, but later had to increase back to 50 mg due to worsening of anxiety

## 2024-01-18 NOTE — Patient Instructions (Addendum)
 Please schedule bone density.  Please schedule AWV.  Please continue to take medications as prescribed.  Please continue to follow low carb diet and perform moderate exercise/walking at least 150 mins/week.  Please get fasting blood tests done before the next visit.

## 2024-01-18 NOTE — Assessment & Plan Note (Signed)
Hb has been around 15, but had been doing blood donations as advised in the past by previous providers Hb has been up to 17 in the past Referred to Heme/Onc. for further evaluation as she has had unprovoked VTE in the past as well - visit notes reviewed Likely due to OSA

## 2024-01-19 LAB — MICROALBUMIN / CREATININE URINE RATIO
Creatinine, Urine: 45.4 mg/dL
Microalb/Creat Ratio: <7 mg/g{creat} (ref 0–29)
Microalbumin, Urine: <3 ug/mL

## 2024-01-23 ENCOUNTER — Ambulatory Visit (HOSPITAL_COMMUNITY)
Admission: RE | Admit: 2024-01-23 | Discharge: 2024-01-23 | Disposition: A | Discharge status: Home / Self Care | Source: Ambulatory Visit | Attending: Internal Medicine | Admitting: Internal Medicine

## 2024-01-23 DIAGNOSIS — Z780 Asymptomatic menopausal state: Secondary | ICD-10-CM | POA: Insufficient documentation

## 2024-01-24 ENCOUNTER — Telehealth: Payer: Self-pay | Admitting: Internal Medicine

## 2024-01-24 ENCOUNTER — Ambulatory Visit

## 2024-01-24 NOTE — Telephone Encounter (Signed)
 Patient came by office and received a [facility] bill for date of service 04.10.2025 she is on medicare/supplement she should not have received a bill, needs to know why she got a bill.

## 2024-01-25 ENCOUNTER — Other Ambulatory Visit: Payer: Self-pay | Admitting: Internal Medicine

## 2024-01-25 DIAGNOSIS — M810 Age-related osteoporosis without current pathological fracture: Secondary | ICD-10-CM | POA: Insufficient documentation

## 2024-01-25 MED ORDER — ALENDRONATE SODIUM 70 MG PO TABS: 70.0000 mg | ORAL_TABLET | ORAL | 3 refills | Status: DC

## 2024-01-30 ENCOUNTER — Other Ambulatory Visit: Payer: Self-pay

## 2024-01-30 DIAGNOSIS — M810 Age-related osteoporosis without current pathological fracture: Secondary | ICD-10-CM

## 2024-01-30 MED ORDER — ALENDRONATE SODIUM 70 MG PO TABS: 70.0000 mg | ORAL_TABLET | ORAL | 3 refills | Status: AC

## 2024-01-30 NOTE — Telephone Encounter (Signed)
 Sent to correct pharmacy

## 2024-01-30 NOTE — Telephone Encounter (Signed)
 Copied from CRM [id number]. Topic: Clinical - Prescription Issue >> Jan 30, 2024 12:27 PM Star [place] wrote: Reason for CRM: alendronate  (FOSAMAX ) 70 MG tablet  was sent to wrong pharmacy, please send to [id number] - [place], [place] - [address]

## 2024-02-18 ENCOUNTER — Other Ambulatory Visit: Payer: Self-pay | Admitting: Internal Medicine

## 2024-02-18 DIAGNOSIS — E782 Mixed hyperlipidemia: Secondary | ICD-10-CM

## 2024-02-21 ENCOUNTER — Other Ambulatory Visit: Payer: Self-pay | Admitting: Internal Medicine

## 2024-02-21 DIAGNOSIS — F419 Anxiety disorder, unspecified: Secondary | ICD-10-CM

## 2024-04-15 ENCOUNTER — Ambulatory Visit: Payer: BC Managed Care – PPO | Admitting: Physician Assistant

## 2024-04-15 ENCOUNTER — Inpatient Hospital Stay: Payer: BC Managed Care – PPO | Attending: Hematology

## 2024-04-15 DIAGNOSIS — Z86711 Personal history of pulmonary embolism: Secondary | ICD-10-CM | POA: Diagnosis not present

## 2024-04-15 DIAGNOSIS — Z7982 Long term (current) use of aspirin: Secondary | ICD-10-CM | POA: Diagnosis not present

## 2024-04-15 DIAGNOSIS — Z803 Family history of malignant neoplasm of breast: Secondary | ICD-10-CM | POA: Diagnosis not present

## 2024-04-15 DIAGNOSIS — D751 Secondary polycythemia: Secondary | ICD-10-CM | POA: Insufficient documentation

## 2024-04-15 DIAGNOSIS — Z801 Family history of malignant neoplasm of trachea, bronchus and lung: Secondary | ICD-10-CM | POA: Insufficient documentation

## 2024-04-15 DIAGNOSIS — Z79899 Other long term (current) drug therapy: Secondary | ICD-10-CM | POA: Insufficient documentation

## 2024-04-15 LAB — CBC WITH DIFFERENTIAL/PLATELET
Abs Immature Granulocytes: 0.01 K/uL (ref 0.00–0.07)
Basophils Absolute: 0.1 K/uL (ref 0.0–0.1)
Basophils Relative: 1 %
Eosinophils Absolute: 0.2 K/uL (ref 0.0–0.5)
Eosinophils Relative: 3 %
HCT: 46.3 % — ABNORMAL HIGH (ref 36.0–46.0)
Hemoglobin: 15 g/dL (ref 12.0–15.0)
Immature Granulocytes: 0 %
Lymphocytes Relative: 29 %
Lymphs Abs: 2 K/uL (ref 0.7–4.0)
MCH: 27.6 pg (ref 26.0–34.0)
MCHC: 32.4 g/dL (ref 30.0–36.0)
MCV: 85.3 fL (ref 80.0–100.0)
Monocytes Absolute: 0.6 K/uL (ref 0.1–1.0)
Monocytes Relative: 9 %
Neutro Abs: 3.8 K/uL (ref 1.7–7.7)
Neutrophils Relative %: 58 %
Platelets: 216 K/uL (ref 150–400)
RBC: 5.43 MIL/uL — ABNORMAL HIGH (ref 3.87–5.11)
RDW: 14 % (ref 11.5–15.5)
WBC: 6.7 K/uL (ref 4.0–10.5)
nRBC: 0 % (ref 0.0–0.2)

## 2024-04-23 NOTE — Progress Notes (Unsigned)
 [facility] [address], [place] [address]   CLINIC:  Medical Oncology/Hematology  PCP:  Tobie Suzzane POUR, MD [address] [place] [address] [phone]   REASON FOR VISIT:  Follow-up for erythrocytosis   PRIOR THERAPY: None   CURRENT THERAPY: Active surveillance  INTERVAL HISTORY:   Mallory [name] 68 y.o. female returns for routine follow-up of erythrocytosis.  She was last evaluated via telemedicine visit by Pleasant Barefoot PA-C on 04/14/2023.   She reports that she is feeling well.  She retired in October.  And is enjoying staying busy with yard work and spending time with her grandaughter. She denies any hospitalizations, surgeries, or major changes in health status since her last visit.  She continues to use CPAP nightly, but sometimes cannot tolerate it for the full night.   She has not had any thrombotic events since her last visit, but has history of unprovoked PE in 2007 that was treated with warfarin x 1 year.   She has occasional floaters in her left eye accompanied by occasional headaches.  She denies any itching after showers or changing in the colors of her fingers.   No erythromelalgia.  She denies any fevers, night sweats, or unintentional weight loss.   She has 100% energy and 100% appetite. She endorses that she is maintaining a stable weight.  ASSESSMENT & PLAN:  1.  Erythrocytosis, secondary polycythemia from OSA - Seen at the request of Dr. Tobie for erythrocytosis. - She had elevated RBC count since 2012.  Elevated hemoglobin from 2014 through 2022 - Hgb peaked at 17.2 (06/15/2013), has been normal since October 2022. - Most recent CBC (03/04/2022) normal Hgb 14.4, normal hematocrit 44.6, mildly elevated RBC 5.34 - She donated blood in 2022 and again in January 2023 - She is a lifelong non-smoker. - She has obstructive sleep apnea, uses CPAP regularly - was diagnosed in June 2022 - No COPD, lung disease, or known heart  disease - No known carbon monoxide exposure. - History of unprovoked pulmonary embolism (03/20/2006), treated with 1 year of Coumadin. - No aquagenic pruritus/vasomotor symptoms.  She lost about 40 pounds switching to keto diet and exercise.  No B symptoms. - Hematology work-up (03/04/2022): Normal mid-range erythropoietin  11.2 JAK2 with reflex to CALR, MPL, and Exons 12-15 was negative - No evidence of myeloproliferative neoplasm or polycythemia vera. - Most recent labs (04/15/2024): Hgb 15.0/hematocrit 46.3%. -- She takes aspirin  81 mg every daily.  Additional cardiac risk factors include high cholesterol and pre-diabetes. - Differential diagnosis favors mild secondary polycythemia/erythrocytosis in the setting of obstructive sleep apnea.  Hemoglobin has improved since she started CPAP (June 2022) and after her blood donation in January 2023.   - PLAN:  No indication for treatment at this time.  Continue excellent use of CPAP.   - Since erythrocytosis is mild and overall stable, recommend discharge to PCP at this time, with the following recommendations: Continue aspirin  81 mg daily Can continue voluntary blood donation once every 6 months Recommend return to hematology clinic/Cancer Center if worsening erythrocytosis (hematocrit > 52.0) or development of other blood abnormalities.   2.  Social/family: - Lives with husband.  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  Non-smoker. - No family history of polycythemia vera.  Maternal grandfather had lung cancer.  3 maternal aunts had breast cancer. - She is up-to-date on mammograms, last one on 09/24/2021 negative.  She reports that she is due for colonoscopy this year.  PLAN SUMMARY: >> Discharge to PCP       REVIEW OF SYSTEMS:   Review of Systems  Constitutional:  Negative for appetite change, chills, diaphoresis, fatigue, fever and unexpected weight change.  HENT:   Negative for lump/mass and nosebleeds.   Eyes:  Negative  for eye problems.  Respiratory:  Positive for cough (dry). Negative for hemoptysis and shortness of breath.   Cardiovascular:  Negative for chest pain, leg swelling and palpitations.  Gastrointestinal:  Negative for abdominal pain, blood in stool, constipation, diarrhea, nausea and vomiting.  Genitourinary:  Negative for hematuria.   Skin: Negative.   Neurological:  Negative for dizziness, headaches and light-headedness.  Hematological:  Does not bruise/bleed easily.     PHYSICAL EXAM:  ECOG PERFORMANCE STATUS: 0 - Asymptomatic  Vitals:   04/24/24 1306  BP: 126/75  Pulse: 60  Resp: 16  Temp: 97.6 F (36.4 C)  SpO2: 96%   There were no vitals filed for this visit. Physical Exam Constitutional:      Appearance: Normal appearance. She is normal weight.  Cardiovascular:     Heart sounds: Normal heart sounds.  Pulmonary:     Breath sounds: Normal breath sounds.  Neurological:     General: No focal deficit present.     Mental Status: Mental status is at baseline.  Psychiatric:        Behavior: Behavior normal. Behavior is cooperative.     PAST MEDICAL/SURGICAL HISTORY:  Past Medical History:  Diagnosis Date   GERD (gastroesophageal reflux disease)    HLD (hyperlipidemia)    IBS (irritable bowel syndrome)    Osteoarthritis of knee    Right; had right TKR   Pulmonary embolism (HCC)    5 yrs ago (unknown region)   Sleep apnea    Past Surgical History:  Procedure Laterality Date   CESAREAN SECTION  1991   mc   CESAREAN SECTION N/A    [facility] 01/16/2021   CHOLECYSTECTOMY  15 yrs ago   mc   COLONOSCOPY  05/24/2012   Procedure: COLONOSCOPY;  Surgeon: Claudis RAYMOND Rivet, MD;  Location: [facility];  Service: Endoscopy;  Laterality: N/A;  220   ERCP  08/12/2011   Procedure: ENDOSCOPIC RETROGRADE CHOLANGIOPANCREATOGRAPHY (ERCP);  Surgeon: Claudis RAYMOND Rivet, MD;  Location: [facility];  Service: Endoscopy;  Laterality: N/A;   ERCP W/ SPHICTEROTOMY  08/12/11   Dr  Rehman-?microlithiasis, SOD   ESOPHAGOGASTRODUODENOSCOPY  11/25/2011   Procedure: ESOPHAGOGASTRODUODENOSCOPY (EGD);  Surgeon: Claudis RAYMOND Rivet, MD;  Location: [facility];  Service: Endoscopy;  Laterality: N/A;  830   EUS  01/19/2012   Procedure: UPPER ENDOSCOPIC ULTRASOUND (EUS) LINEAR;  Surgeon: Toribio SHAUNNA Cedar, MD;  Location: [facility];  Service: Endoscopy;  Laterality: N/A;  pt moved up a hour early by AW , Patty @ [place] office ok'd / pt was called by AW   JOINT REPLACEMENT N/A    [facility]reesia 01/16/2021   SPHINCTEROTOMY  08/12/2011   Procedure: ANNETT;  Surgeon: Claudis RAYMOND Rivet, MD;  Location: [facility];  Service: Endoscopy;;  with ballon passage   TOTAL KNEE ARTHROPLASTY Right 09/13/2017   TOTAL KNEE ARTHROPLASTY Right 09/13/2017   Procedure: RIGHT TOTAL KNEE ARTHROPLASTY;  Surgeon: Vernetta Lonni GRADE, MD;  Location: [facility] OR;  Service: Orthopedics;  Laterality: Right;   TUBAL LIGATION      SOCIAL HISTORY:  Social History   Socioeconomic History   Marital status: Married    Spouse name: Not on file   Number of children:  2   Years of education: Not on file   Highest education level: 12th grade  Occupational History   Occupation: [employer]    Comment: Careers information officer  Tobacco Use   Smoking status: Never   Smokeless tobacco: Never  Vaping Use   Vaping status: Never Used  Substance and Sexual Activity   Alcohol use: Yes    Alcohol/week: 0.0 standard drinks of alcohol    Comment: occasional wine or moonshine, but only on vacation   Drug use: No   Sexual activity: Yes    Birth control/protection: Post-menopausal  Other Topics Concern   Not on file  Social History Narrative    2 daughters      Social Drivers of Corporate investment banker Strain: Low Risk  (01/17/2024)   Overall Financial Resource Strain (CARDIA)    Difficulty of Paying Living Expenses: Not hard at all  Food Insecurity: No Food Insecurity (01/17/2024)   Hunger Vital Sign    Worried About Running Out of  Food in the Last Year: Never true    Ran Out of Food in the Last Year: Never true  Transportation Needs: No Transportation Needs (01/17/2024)   PRAPARE - Administrator, Civil Service (Medical): No    Lack of Transportation (Non-Medical): No  Physical Activity: Insufficiently Active (01/17/2024)   Exercise Vital Sign    Days of Exercise per Week: 4 days    Minutes of Exercise per Session: 20 min  Stress: No Stress Concern Present (01/17/2024)   [employer] of Occupational Health - Occupational Stress Questionnaire    Feeling of Stress : Not at all  Social Connections: Socially Integrated (01/17/2024)   Social Connection and Isolation Panel    Frequency of Communication with Friends and Family: More than three times a week    Frequency of Social Gatherings with Friends and Family: More than three times a week    Attends Religious Services: More than 4 times per year    Active Member of [employer] or Organizations: Yes    Attends Banker Meetings: 1 to 4 times per year    Marital Status: Married  Catering manager Violence: Not on file    FAMILY HISTORY:  Family History  Problem Relation Age of Onset   Diabetes Mother    Heart failure Father    Heart disease Sister    Diabetes Brother    Heart disease Maternal Grandmother    Cancer Maternal Grandfather        ? pancreatic or lung   Anesthesia problems Neg Hx    Hypotension Neg Hx    Malignant hyperthermia Neg Hx    Pseudochol deficiency Neg Hx    Stroke Neg Hx    Colon cancer Neg Hx    Colon polyps Neg Hx    Esophageal cancer Neg Hx    Rectal cancer Neg Hx    Stomach cancer Neg Hx     CURRENT MEDICATIONS:  Outpatient Encounter Medications as of 04/24/2024  Medication Sig Note   alendronate  (FOSAMAX ) 70 MG tablet Take 1 tablet (70 mg total) by mouth every 7 (seven) days. Take with a full glass of water  on an empty stomach.    aspirin  EC 81 MG tablet Take 81 mg by mouth every other day. Swallow whole.     blood glucose meter kit and supplies Dispense based on patient and insurance preference. Use up to four times daily as directed. (FOR ICD-10 E10.9, E11.9).    Blood  Glucose Monitoring Suppl (ACCU-CHEK GUIDE ME) w/Device KIT 1 Device by Does not apply route 4 (four) times daily.    cholecalciferol (VITAMIN D3) 25 MCG (1000 UNIT) tablet Take 1,000 Units by mouth daily.    glucose blood (ACCU-CHEK GUIDE TEST) test strip Use as instructed    hyoscyamine  (LEVSIN  SL) 0.125 MG SL tablet TAKE 1 OR 2 TABLETS BY MOUTH EVERY 6 HOURS AS NEEDED    ibuprofen (ADVIL,MOTRIN) 200 MG tablet Take 400 mg by mouth daily as needed for mild pain or moderate pain. Pain 09/21/2017: States not taking due to other meds, MD aware.    Microlet Lancets MISC USE TO CHECK BLOOD SUGAR FOUR TIMES DAILY AS DIRECTED    pantoprazole  (PROTONIX ) 40 MG tablet Take 1 tablet (40 mg total) by mouth daily before breakfast.    rosuvastatin  (CRESTOR ) 10 MG tablet TAKE 1 TABLET(10 MG) BY MOUTH DAILY    sertraline  (ZOLOFT ) 50 MG tablet Take 1 tablet (50 mg total) by mouth daily.    UNABLE TO FIND Home Sleep Study Kit - use as directed to detect sleep apnea Dx: D75.1; R06.83    No facility-administered encounter medications on file as of 04/24/2024.    ALLERGIES:  Allergies  Allergen Reactions   Promethazine  Hcl Hives   Phenytoin Itching   Hydromorphone Itching   Macrolides And Ketolides Itching   Nitrofurantoin Monohyd Macro Itching    LABORATORY DATA:  I have reviewed the labs as listed.  CBC    Component Value Date/Time   WBC 6.7 04/15/2024 1204   RBC 5.43 (H) 04/15/2024 1204   HGB 15.0 04/15/2024 1204   HGB 15.3 01/12/2024 0849   HCT 46.3 (H) 04/15/2024 1204   HCT 46.9 (H) 01/12/2024 0849   PLT 216 04/15/2024 1204   PLT 210 01/12/2024 0849   MCV 85.3 04/15/2024 1204   MCV 84 01/12/2024 0849   MCH 27.6 04/15/2024 1204   MCHC 32.4 04/15/2024 1204   RDW 14.0 04/15/2024 1204   RDW 14.4 01/12/2024 0849   LYMPHSABS 2.0  04/15/2024 1204   LYMPHSABS 2.0 01/12/2024 0849   MONOABS 0.6 04/15/2024 1204   EOSABS 0.2 04/15/2024 1204   EOSABS 0.2 01/12/2024 0849   BASOSABS 0.1 04/15/2024 1204   BASOSABS 0.1 01/12/2024 0849      Latest Ref Rng & Units 01/12/2024    8:49 AM 07/14/2023    8:27 AM 12/23/2022    9:12 AM  CMP  Glucose 70 - 99 mg/dL 94  95  898   BUN 8 - 27 mg/dL 17  18  11    Creatinine 0.57 - 1.00 mg/dL 9.18  9.16  9.25   Sodium 134 - 144 mmol/L 143  141  142   Potassium 3.5 - 5.2 mmol/L 4.2  4.1  4.3   Chloride 96 - 106 mmol/L 105  104  104   CO2 20 - 29 mmol/L 21  20  21    Calcium  8.7 - 10.3 mg/dL 9.4  9.3  9.6   Total Protein 6.0 - 8.5 g/dL 6.4  6.3  6.9   Total Bilirubin 0.0 - 1.2 mg/dL 0.4  0.5  0.4   Alkaline Phos 44 - 121 IU/L 117  99  147   AST 0 - 40 IU/L 25  21  23    ALT 0 - 32 IU/L 29  18  21      DIAGNOSTIC IMAGING:  I have independently reviewed the relevant imaging and discussed with the patient.   WRAP UP:  All questions were answered. The patient knows to call the clinic with any problems, questions or concerns.  Medical decision making: Low  Time spent on visit: I spent 15 minutes counseling the patient face to face. The total time spent in the appointment was 22 minutes and more than 50% was on counseling.  Pleasant CHRISTELLA Barefoot, PA-C  04/24/24 1:27 PM

## 2024-04-24 ENCOUNTER — Inpatient Hospital Stay (HOSPITAL_BASED_OUTPATIENT_CLINIC_OR_DEPARTMENT_OTHER): Payer: BC Managed Care – PPO | Admitting: Physician Assistant

## 2024-04-24 VITALS — BP 126/75 | HR 60 | Temp 97.6°F | Resp 16

## 2024-04-24 DIAGNOSIS — D751 Secondary polycythemia: Secondary | ICD-10-CM

## 2024-04-24 NOTE — Patient Instructions (Signed)
 [facility] at [facility] Discharge Instructions  You were seen today by Pleasant Barefoot PA-C for your elevated red blood cells. - We have not seen any evidence of cancerous cause of elevated red blood cells. - Your elevated red blood cells are most likely from your sleep apnea - continue to use your CPAP regularly to keep your blood cells from going to high. - You can donate blood every 6 to 12 months to help keep your blood counts at a lower level. - Continue aspirin  81 mg daily to decrease your risk of blood clots.  FOLLOW-UP APPOINTMENT: At this time, you do not need any additional follow-up visits at the [facility].  We recommend that your primary care doctor continue to check your blood every 6 to 12 months.  If you have any new blood abnormalities or worsening elevation in your red blood cells (hematocrit >52.0), we recommend that you return to the Cancer Center for additional evaluation.   ** Thank you for trusting me with your healthcare!  I strive to provide all of my patients with quality care at each visit.  If you receive a survey for this visit, I would be so grateful to you for taking the time to provide feedback.  Thank you in advance!  ~ Nefertari Rebman                   Dr. Alean Stands   &   Pleasant Barefoot, PA-C   - - - - - - - - - - - - - - - - - -     Thank you for choosing [facility] at [facility] to provide your oncology and hematology care.  To afford each patient quality time with our provider, please arrive at least 15 minutes before your scheduled appointment time.   If you have a lab appointment with the Cancer Center please come in thru the Main Entrance and check in at the main information desk.  You need to re-schedule your appointment should you arrive 10 or more minutes late.  We strive to give you quality time with our providers, and arriving late affects you and other patients whose appointments are  after yours.  Also, if you no show three or more times for appointments you may be dismissed from the clinic at the providers discretion.     Again, thank you for choosing [facility].  Our hope is that these requests will decrease the amount of time that you wait before being seen by our physicians.       _____________________________________________________________  Should you have questions after your visit to [facility], please contact our office at [phone] and follow the prompts.  Our office hours are 8:00 a.m. and 4:30 p.m. Monday - Friday.  Please note that voicemails left after 4:00 p.m. may not be returned until the following business day.  We are closed weekends and major holidays.  You do have access to a nurse 24-7, just call the main number to the clinic [phone] and do not press any options, hold on the line and a nurse will answer the phone.    For prescription refill requests, have your pharmacy contact our office and allow 72 hours.    Due to Covid, you will need to wear a mask upon entering the hospital. If you do not have a mask, a mask will be given to you at the Main Entrance  upon arrival. For doctor visits, patients may have 1 support person age 25 or older with them. For treatment visits, patients can not have anyone with them due to social distancing guidelines and our immunocompromised population.

## 2024-05-06 ENCOUNTER — Other Ambulatory Visit: Payer: Self-pay | Admitting: Internal Medicine

## 2024-05-06 DIAGNOSIS — K219 Gastro-esophageal reflux disease without esophagitis: Secondary | ICD-10-CM

## 2024-06-17 ENCOUNTER — Emergency Department (HOSPITAL_COMMUNITY)

## 2024-06-17 ENCOUNTER — Ambulatory Visit: Payer: Self-pay

## 2024-06-17 ENCOUNTER — Other Ambulatory Visit: Payer: Self-pay

## 2024-06-17 ENCOUNTER — Ambulatory Visit: Admitting: Family Medicine

## 2024-06-17 ENCOUNTER — Emergency Department (HOSPITAL_COMMUNITY)
Admission: EM | Admit: 2024-06-17 | Discharge: 2024-06-17 | Disposition: A | Discharge status: Home / Self Care | Source: Ambulatory Visit | Attending: Emergency Medicine | Admitting: Emergency Medicine

## 2024-06-17 DIAGNOSIS — M79642 Pain in left hand: Secondary | ICD-10-CM | POA: Insufficient documentation

## 2024-06-17 DIAGNOSIS — Z7982 Long term (current) use of aspirin: Secondary | ICD-10-CM | POA: Diagnosis not present

## 2024-06-17 DIAGNOSIS — M5412 Radiculopathy, cervical region: Secondary | ICD-10-CM

## 2024-06-17 LAB — CBC WITH DIFFERENTIAL/PLATELET
Abs Immature Granulocytes: 0 K/uL (ref 0.00–0.07)
Basophils Absolute: 0 K/uL (ref 0.0–0.1)
Basophils Relative: 1 %
Eosinophils Absolute: 0.2 K/uL (ref 0.0–0.5)
Eosinophils Relative: 3 %
HCT: 48 % — ABNORMAL HIGH (ref 36.0–46.0)
Hemoglobin: 15.8 g/dL — ABNORMAL HIGH (ref 12.0–15.0)
Immature Granulocytes: 0 %
Lymphocytes Relative: 21 %
Lymphs Abs: 1.2 K/uL (ref 0.7–4.0)
MCH: 27.4 pg (ref 26.0–34.0)
MCHC: 32.9 g/dL (ref 30.0–36.0)
MCV: 83.2 fL (ref 80.0–100.0)
Monocytes Absolute: 0.5 K/uL (ref 0.1–1.0)
Monocytes Relative: 8 %
Neutro Abs: 4 K/uL (ref 1.7–7.7)
Neutrophils Relative %: 67 %
Platelets: 179 K/uL (ref 150–400)
RBC: 5.77 MIL/uL — ABNORMAL HIGH (ref 3.87–5.11)
RDW: 14.2 % (ref 11.5–15.5)
WBC: 5.9 K/uL (ref 4.0–10.5)
nRBC: 0 % (ref 0.0–0.2)

## 2024-06-17 LAB — COMPREHENSIVE METABOLIC PANEL WITH GFR
ALT: 25 U/L (ref 0–44)
AST: 30 U/L (ref 15–41)
Albumin: 3.8 g/dL (ref 3.5–5.0)
Alkaline Phosphatase: 77 U/L (ref 38–126)
Anion gap: 8 (ref 5–15)
BUN: 17 mg/dL (ref 8–23)
CO2: 23 mmol/L (ref 22–32)
Calcium: 8.9 mg/dL (ref 8.9–10.3)
Chloride: 106 mmol/L (ref 98–111)
Creatinine, Ser: 0.84 mg/dL (ref 0.44–1.00)
GFR, Estimated: >60 mL/min (ref >60)
Glucose, Bld: 116 mg/dL — ABNORMAL HIGH (ref 70–99)
Potassium: 3.7 mmol/L (ref 3.5–5.1)
Sodium: 137 mmol/L (ref 135–145)
Total Bilirubin: 0.7 mg/dL (ref 0.0–1.2)
Total Protein: 7 g/dL (ref 6.5–8.1)

## 2024-06-17 LAB — TROPONIN I (HIGH SENSITIVITY): Troponin I (High Sensitivity): 3 ng/L (ref <18)

## 2024-06-17 LAB — CK: Total CK: 60 U/L (ref 38–234)

## 2024-06-17 MED ORDER — METHOCARBAMOL 500 MG PO TABS: 500.0000 mg | ORAL_TABLET | Freq: Two times a day (BID) | ORAL | 0 refills | Status: AC

## 2024-06-17 MED ORDER — HYDROCODONE-ACETAMINOPHEN 5-325 MG PO TABS
1.0000 | ORAL_TABLET | Freq: Once | ORAL | Status: AC
  Administered 2024-06-17: 1 via ORAL
  Filled 2024-06-17: qty 1

## 2024-06-17 MED ORDER — PREDNISONE 50 MG PO TABS
60.0000 mg | ORAL_TABLET | Freq: Once | ORAL | Status: AC
  Administered 2024-06-17: 60 mg via ORAL
  Filled 2024-06-17: qty 1

## 2024-06-17 MED ORDER — PREDNISONE 10 MG PO TABS: 40.0000 mg | ORAL_TABLET | Freq: Every day | ORAL | 0 refills | Status: AC

## 2024-06-17 NOTE — Telephone Encounter (Signed)
 [name], Mallory 9:47 AM Please have the patient follow up in the Emergency Department for urgent imaging studies to evaluate for a possible blood clot.    Spoke with patient and advised. She will proceed to ED for evaluation. Nothing further needed at this time.

## 2024-06-17 NOTE — ED Triage Notes (Signed)
 Pt arrived via POV from home for concerns of a blood clot in her left arm. Pt reports 4 days ago pain began in her hand and now it radiates to her shoulder. Pt reports calling PCP this morning and being advised to seek evaluation in the ER.

## 2024-06-17 NOTE — Telephone Encounter (Signed)
 FYI Only or Action Required?: FYI only for provider.  Patient was last seen in primary care on 01/18/2024 by Tobie Suzzane POUR, MD.  Called Nurse Triage reporting Hand Pain Pain is moving up arm and now goes to pt's shoulder.  Symptoms began several days ago.  Interventions attempted:  Tylenol .   Symptoms are: gradually worsening.  Triage Disposition: See PCP When Office is Open (Within 3 Days)  Patient/caregiver understands and will follow disposition?: Yes                  Copied from CRM [id number]. Topic: Clinical - Red Word Triage >> Jun 17, 2024  8:25 AM Montie POUR wrote: Red Word that prompted transfer to Nurse Triage:  For the last 4 days, she has had pain in her left hand and now it is worse and moving up her arm. Pain level is a 7 to 8. She does not know what has caused the pain. She is wanting to make an appointment. Reason for Disposition  [1] MODERATE pain (e.g., interferes with normal activities) AND [2] present > 3 days  Answer Assessment - Initial Assessment Questions 1. ONSET: When did the pain start?     4 days 2. LOCATION: Where is the pain located?     Left arm up to shoulder 3. PAIN: How bad is the pain? (Scale 1-10; or mild, moderate, severe)     7-8/10 4. WORK OR EXERCISE: Has there been any recent work or exercise that involved this part (i.e., hand or wrist) of the body?     no 5. CAUSE: What do you think is causing the pain?     Blood clot 6. AGGRAVATING FACTORS: What makes the pain worse? (e.g., using computer)     no 7. OTHER SYMPTOMS: Do you have any other symptoms? (e.g., fever, neck pain, numbness or tingling, rash, swelling)     no  Protocols used: Hand Pain-A-AH

## 2024-06-17 NOTE — Discharge Instructions (Addendum)
 Please follow-up closely with neurosurgery on an outpatient basis.  Return to the emergency department immediately for any new or worsening symptoms.

## 2024-06-17 NOTE — ED Provider Notes (Signed)
 [place] [facility]ERGENCY DEPARTMENT AT [facility] Provider Note   CSN: [id number] Arrival date & time: 06/17/24  1014     Patient presents with: Arm Pain   Mallory [name] is a 68 y.o. female.   Patient is a 68 year old female who presents to the emergency department the chief complaint of pain to the left hand which began approximately 4 days ago which is now radiating up the left arm and to the shoulder.  Patient notes that she has had no recent falls or blunt trauma.  She does admit to a history of osteoarthritis.  Patient does also have a history of pulmonary embolus but is not currently on anticoagulation.  She notes that she has had no associated swelling, erythema, warmth.  She did reach out to her primary care doctor this morning who recommended she come to the emergency department for further evaluation to rule out a possible blood clot.  Patient notes that she has had no associated chest pain or shortness of breath.  She denies any dizziness, lightheadedness or syncope.   Arm Pain       Prior to Admission medications   Medication Sig Start Date End Date Taking? Authorizing Provider  alendronate  (FOSAMAX ) 70 MG tablet Take 1 tablet (70 mg total) by mouth every 7 (seven) days. Take with a full glass of water  on an empty stomach. 01/30/24   Tobie Suzzane POUR, MD  aspirin  EC 81 MG tablet Take 81 mg by mouth every other day. Swallow whole.    [provider]  blood glucose meter kit and supplies Dispense based on patient and insurance preference. Use up to four times daily as directed. (FOR ICD-10 E10.9, E11.9). 12/13/22   Tobie Suzzane POUR, MD  Blood Glucose Monitoring Suppl (ACCU-CHEK GUIDE ME) w/Device KIT 1 Device by Does not apply route 4 (four) times daily. 12/13/22   Golda Lynwood PARAS, MD  cholecalciferol (VITAMIN D3) 25 MCG (1000 UNIT) tablet Take 1,000 Units by mouth daily.    [provider]  glucose blood (ACCU-CHEK GUIDE TEST) test strip Use as instructed  01/18/24   Tobie Suzzane POUR, MD  hyoscyamine  (LEVSIN  SL) 0.125 MG SL tablet TAKE 1 OR 2 TABLETS BY MOUTH EVERY 6 HOURS AS NEEDED 06/08/22   Teressa Toribio SQUIBB, MD  ibuprofen (ADVIL,MOTRIN) 200 MG tablet Take 400 mg by mouth daily as needed for mild pain or moderate pain. Pain    [provider]  Microlet Lancets MISC USE TO CHECK BLOOD SUGAR FOUR TIMES DAILY AS DIRECTED 01/18/24   Tobie Suzzane POUR, MD  pantoprazole  (PROTONIX ) 40 MG tablet TAKE 1 TABLET BY MOUTH DAILY BEFORE BREAKFAST 05/06/24   Tobie Suzzane POUR, MD  rosuvastatin  (CRESTOR ) 10 MG tablet TAKE 1 TABLET(10 MG) BY MOUTH DAILY 02/19/24   Tobie Suzzane POUR, MD  sertraline  (ZOLOFT ) 50 MG tablet Take 1 tablet (50 mg total) by mouth daily. 02/21/24   Tobie Suzzane POUR, MD  UNABLE TO FIND Home Sleep Study Kit - use as directed to detect sleep apnea Dx: D75.1; R06.83 03/05/21   Elnor Fairy HERO, NP    Allergies: Promethazine  hcl, Phenytoin, Hydromorphone, Macrolides and ketolides, and Nitrofurantoin monohyd macro    Review of Systems  Musculoskeletal:        Pain to left upper extremity  All other systems reviewed and are negative.   Updated Vital Signs BP (!) 151/80 (BP Location: Right Arm)   Pulse (!) 57   Temp 97.8 F (36.6 C) (Oral)  Resp 16   Ht 5' 3 (1.6 m)   Wt 73.7 kg   LMP 05/11/2011   SpO2 97%   BMI 28.78 kg/m   Physical Exam Vitals and nursing note reviewed.  Constitutional:      Appearance: Normal appearance.  HENT:     Head: Normocephalic and atraumatic.  Eyes:     Extraocular Movements: Extraocular movements intact.     Conjunctiva/sclera: Conjunctivae normal.     Pupils: Pupils are equal, round, and reactive to light.  Cardiovascular:     Rate and Rhythm: Normal rate and regular rhythm.     Pulses: Normal pulses.     Heart sounds: Normal heart sounds. No murmur heard.    No gallop.  Pulmonary:     Effort: Pulmonary effort is normal. No respiratory distress.     Breath sounds: Normal breath sounds. No  stridor. No wheezing, rhonchi or rales.  Musculoskeletal:        General: Normal range of motion.     Cervical back: Normal range of motion and neck supple. No rigidity or tenderness.     Comments: Tender to palpation noted over the left hand and wrist, no overlying erythema, warmth, edema, mild tenderness noted of the remainder of the left upper extremity, radial pulse 2+ in bilateral upper extremities, cap refill less than 2 seconds distally, full range of motion noted throughout, no obvious deformity or bruising, no skin breakdown or ulceration, no lacerations or abrasions  Skin:    General: Skin is warm and dry.  Neurological:     General: No focal deficit present.     Mental Status: She is alert and oriented to person, place, and time. Mental status is at baseline.  Psychiatric:        Mood and Affect: Mood normal.        Behavior: Behavior normal.        Thought Content: Thought content normal.        Judgment: Judgment normal.     (all labs ordered are listed, but only abnormal results are displayed) Labs Reviewed  COMPREHENSIVE METABOLIC PANEL WITH GFR - Abnormal; Notable for the following components:      Result Value   Glucose, Bld 116 (*)    All other components within normal limits  CBC WITH DIFFERENTIAL/PLATELET - Abnormal; Notable for the following components:   RBC 5.77 (*)    Hemoglobin 15.8 (*)    HCT 48.0 (*)    All other components within normal limits  TROPONIN I (HIGH SENSITIVITY)    EKG: None  Radiology: No results found.   Procedures   Medications Ordered in the ED  HYDROcodone -acetaminophen  (NORCO/VICODIN) 5-325 MG per tablet 1 tablet (1 tablet Oral Given 06/17/24 1151)                                    Medical Decision Making Amount and/or Complexity of Data Reviewed Labs: ordered. Radiology: ordered.  Risk Prescription drug management.   This patient presents to the ED for concern of left arm pain differential diagnosis includes  cervical radiculopathy, DVT, acute arterial occlusion, fracture, sprain, strain, contusion, septic joint    Additional history obtained:  Additional history obtained from none External records from outside source obtained and reviewed including none   Lab Tests:  I Ordered, and personally interpreted labs.  The pertinent results include: No leukocytosis, no anemia, normal kidney function liver function,  normal electrolytes, negative troponin, negative CK   Imaging Studies ordered:  I ordered imaging studies including CT cervical spine, chest x-ray, x-ray of left wrist and hand, venous duplex left upper extremity I independently visualized and interpreted imaging which showed neuroforaminal narrowing at C3-C7, no acute osseous injuries noted within the left upper extremity, no acute cardiopulmonary process, no DVT I agree with the radiologist interpretation   Medicines ordered and prescription drug management:  I ordered medication including hydrocodone , prednisone  for cervical radiculopathy Reevaluation of the patient after these medicines showed that the patient improved I have reviewed the patients home medicines and have made adjustments as needed   Problem List / ED Course:  Patient is doing well at this time and is stable for discharge home.  Discussed with patient that symptoms did appear to be secondary to cervical radiculopathy.  Will recommend close follow-up with neurosurgery on outpatient basis.  Will continue treatment with steroids and muscle relaxer.  Will provide low-dose muscle relaxer.  Blood work has been otherwise unremarkable.  Do not specked underlying cardiac etiology of her symptoms at this point as EKG is unremarkable and patient has a negative troponin.  She has no indication for DVT at this point.  She has strong peripheral pulses with no indication of arterial occlusion.  The need for close follow-up with neurosurgery on outpatient basis was discussed as well  as strict turn precautions for any new or worsening symptoms.  Patient voiced understand to the plan and had no additional questions.   Social Determinants of Health:  None        Final diagnoses:  None    ED Discharge Orders     None          Daralene Lonni JONETTA DEVONNA 06/17/24 1542    Charlyn Sora, MD 06/18/24 [phone]

## 2024-07-18 LAB — CMP14+EGFR
ALT: 24 IU/L (ref 0–32)
AST: 27 IU/L (ref 0–40)
Albumin: 3.9 g/dL (ref 3.9–4.9)
Alkaline Phosphatase: 104 IU/L (ref 49–135)
BUN/Creatinine Ratio: 13 (ref 12–28)
BUN: 12 mg/dL (ref 8–27)
Bilirubin Total: 0.5 mg/dL (ref 0.0–1.2)
CO2: 21 mmol/L (ref 20–29)
Calcium: 9.4 mg/dL (ref 8.7–10.3)
Chloride: 103 mmol/L (ref 96–106)
Creatinine, Ser: 0.9 mg/dL (ref 0.57–1.00)
Globulin, Total: 2 g/dL (ref 1.5–4.5)
Glucose: 109 mg/dL — ABNORMAL HIGH (ref 70–99)
Potassium: 4.4 mmol/L (ref 3.5–5.2)
Sodium: 141 mmol/L (ref 134–144)
Total Protein: 5.9 g/dL — ABNORMAL LOW (ref 6.0–8.5)
eGFR: 71 mL/min/1.73 (ref >59)

## 2024-07-18 LAB — LIPID PANEL
Chol/HDL Ratio: 3.1 ratio (ref 0.0–4.4)
Cholesterol, Total: 145 mg/dL (ref 100–199)
HDL: 47 mg/dL (ref >39)
LDL Chol Calc (NIH): 80 mg/dL (ref 0–99)
Triglycerides: 96 mg/dL (ref 0–149)
VLDL Cholesterol Cal: 18 mg/dL (ref 5–40)

## 2024-07-18 LAB — HEMOGLOBIN A1C
Est. average glucose Bld gHb Est-mCnc: 143 mg/dL
Hgb A1c MFr Bld: 6.6 % — ABNORMAL HIGH (ref 4.8–5.6)

## 2024-07-23 ENCOUNTER — Ambulatory Visit (INDEPENDENT_AMBULATORY_CARE_PROVIDER_SITE_OTHER): Admitting: Internal Medicine

## 2024-07-23 ENCOUNTER — Encounter: Payer: Self-pay | Admitting: Internal Medicine

## 2024-07-23 VITALS — BP 112/73 | HR 63 | Ht 63.0 in | Wt 164.0 lb

## 2024-07-23 DIAGNOSIS — E559 Vitamin D deficiency, unspecified: Secondary | ICD-10-CM

## 2024-07-23 DIAGNOSIS — E782 Mixed hyperlipidemia: Secondary | ICD-10-CM

## 2024-07-23 DIAGNOSIS — G4733 Obstructive sleep apnea (adult) (pediatric): Secondary | ICD-10-CM

## 2024-07-23 DIAGNOSIS — M810 Age-related osteoporosis without current pathological fracture: Secondary | ICD-10-CM

## 2024-07-23 DIAGNOSIS — E1169 Type 2 diabetes mellitus with other specified complication: Secondary | ICD-10-CM

## 2024-07-23 DIAGNOSIS — F419 Anxiety disorder, unspecified: Secondary | ICD-10-CM

## 2024-07-23 DIAGNOSIS — M5412 Radiculopathy, cervical region: Secondary | ICD-10-CM

## 2024-07-23 DIAGNOSIS — Z23 Encounter for immunization: Secondary | ICD-10-CM | POA: Diagnosis not present

## 2024-07-23 DIAGNOSIS — K219 Gastro-esophageal reflux disease without esophagitis: Secondary | ICD-10-CM

## 2024-07-23 MED ORDER — ACCU-CHEK GUIDE TEST VI STRP: ORAL_STRIP | 12 refills | Status: DC

## 2024-07-23 MED ORDER — ROSUVASTATIN CALCIUM 10 MG PO TABS: 10.0000 mg | ORAL_TABLET | Freq: Every day | ORAL | 3 refills | Status: AC

## 2024-07-23 NOTE — Patient Instructions (Signed)
 Please continue to take medications as prescribed.  Please continue to follow low carb diet and perform moderate exercise/walking at least 150 mins/week.  Please get fasting blood tests done before the next visit.

## 2024-07-23 NOTE — Assessment & Plan Note (Signed)
Well controlled with Protonix ?

## 2024-07-23 NOTE — Assessment & Plan Note (Signed)
 Well-controlled with Zoloft  50 mg once daily As she is retired now, does not have work-related stress

## 2024-07-23 NOTE — Progress Notes (Unsigned)
 Established Patient Office Visit  Subjective:  Patient ID: Mallory [name], female    DOB: [birth year]  Age: 68 y.o. MRN: [id number]  CC:  Chief Complaint  Patient presents with   Diabetes    Six month follow up    HPI Mallory [name] is a 68 y.o. female with past medical history of type II DM with HLD, GERD, IBS, and GAD who presents for f/u of her chronic medical conditions.  Type II DM with HLD: Diet controlled DM.  Her HbA1c has been stable to 6.6 now.  She reports that she has been trying to follow low-carb diet.  She has been taking Crestor  for HLD and tolerates it well.  Her lipid profile shows improvement in LDL.  Erythrocytosis: Her CBC shows Hb of 15.8.  She has seen Hematology for it.  Her work-up for primary polycythemia was negative.  Her elevated Hb is thought to be likely due to OSA.  She has not done blood donation recently.  She denies any itching or active bleeding currently.  She currently takes aspirin , but notices easy bruising.  GAD: She takes Zoloft  50 mg QD.  Denies spells of anxiety, anhedonia, SI or HI.  Cervical radiculopathy: She reports intermittent neck pain and numbness and tingling of the left UE.  She went to ER on 06/17/24 for radicular symptoms in left UE, had CT of cervical spine, which showed multilevel spondylosis and facet hypertrophy with mild neural foraminal encroachment at C3-4, C4-5, C6-7.  He was given oral prednisone  and Robaxin  from ER.  She has been evaluated by [facility], but her symptoms had improved by the time of the visit.  Past Medical History:  Diagnosis Date   GERD (gastroesophageal reflux disease)    HLD (hyperlipidemia)    IBS (irritable bowel syndrome)    Osteoarthritis of knee    Right; had right TKR   Pulmonary embolism (HCC)    5 yrs ago (unknown region)   Sleep apnea     Past Surgical History:  Procedure Laterality Date   CESAREAN SECTION  1991   mc   CESAREAN SECTION N/A    [employer] 01/16/2021    CHOLECYSTECTOMY  15 yrs ago   mc   COLONOSCOPY  05/24/2012   Procedure: COLONOSCOPY;  Surgeon: Claudis RAYMOND Rivet, MD;  Location[facility];  Service: Endoscopy;  Laterality: N/A;  220   ERCP  08/12/2011   Procedure: ENDOSCOPIC RETROGRADE CHOLANGIOPANCREATOGRAPHY (ERCP);  Surgeon: Claudis RAYMOND Rivet, MD;  Location: [facility] ORS;  Service: Endoscopy;  Laterality: N/A;   ERCP W/ SPHICTEROTOMY  08/12/11   Dr Rehman-?microlithiasis, SOD   ESOPHAGOGASTRODUODENOSCOPY  11/25/2011   Procedure: ESOPHAGOGASTRODUODENOSCOPY (EGD);  Surgeon: Claudis RAYMOND Rivet, MD;  Location: [facility];  Service: Endoscopy;  Laterality: N/A;  830   EUS  01/19/2012   Procedure: UPPER ENDOSCOPIC ULTRASOUND (EUS) LINEAR;  Surgeon: Toribio SHAUNNA Cedar, MD;  Location: [facility] ENDOSCOPY;  Service: Endoscopy;  Laterality: N/A;  pt moved up a hour early by AW , Patty @ [place] office ok'd / pt was called by AW   JOINT REPLACEMENT N/A    [employer]reesia 01/16/2021   SPHINCTEROTOMY  08/12/2011   Procedure: ANNETT;  Surgeon: Claudis RAYMOND Rivet, MD;  Location: [facility] ORS;  Service: Endoscopy;;  with ballon passage   TOTAL KNEE ARTHROPLASTY Right 09/13/2017   TOTAL KNEE ARTHROPLASTY Right 09/13/2017   Procedure: RIGHT TOTAL KNEE ARTHROPLASTY;  Surgeon: Vernetta Lonni GRADE, MD;  Location: [facility] OR;  Service: Orthopedics;  Laterality: Right;   TUBAL LIGATION      Family History  Problem Relation Age of Onset   Diabetes Mother    Heart failure Father    Heart disease Sister    Diabetes Brother    Heart disease Maternal Grandmother    Cancer Maternal Grandfather        ? pancreatic or lung   Anesthesia problems Neg Hx    Hypotension Neg Hx    Malignant hyperthermia Neg Hx    Pseudochol deficiency Neg Hx    Stroke Neg Hx    Colon cancer Neg Hx    Colon polyps Neg Hx    Esophageal cancer Neg Hx    Rectal cancer Neg Hx    Stomach cancer Neg Hx     Social History   Socioeconomic History   Marital status: Married    Spouse name: Not on file    Number of children: 2   Years of education: Not on file   Highest education level: 12th grade  Occupational History   Occupation: [employer]    Comment: Careers information officer  Tobacco Use   Smoking status: Never   Smokeless tobacco: Never  Vaping Use   Vaping status: Never Used  Substance and Sexual Activity   Alcohol use: Yes    Alcohol/week: 0.0 standard drinks of alcohol    Comment: occasional wine or moonshine, but only on vacation   Drug use: No   Sexual activity: Yes    Birth control/protection: Post-menopausal  Other Topics Concern   Not on file  Social History Narrative    2 daughters      Social Drivers of Corporate investment banker Strain: Low Risk  (07/21/2024)   Overall Financial Resource Strain (CARDIA)    Difficulty of Paying Living Expenses: Not hard at all  Food Insecurity: No Food Insecurity (07/21/2024)   Hunger Vital Sign    Worried About Running Out of Food in the Last Year: Never true    Ran Out of Food in the Last Year: Never true  Transportation Needs: No Transportation Needs (07/21/2024)   PRAPARE - Administrator, Civil Service (Medical): No    Lack of Transportation (Non-Medical): No  Physical Activity: Insufficiently Active (07/21/2024)   Exercise Vital Sign    Days of Exercise per Week: 3 days    Minutes of Exercise per Session: 20 min  Stress: No Stress Concern Present (07/21/2024)   [employer] of Occupational Health - Occupational Stress Questionnaire    Feeling of Stress: Not at all  Social Connections: Moderately Integrated (07/21/2024)   Social Connection and Isolation Panel    Frequency of Communication with Friends and Family: More than three times a week    Frequency of Social Gatherings with Friends and Family: More than three times a week    Attends Religious Services: 1 to 4 times per year    Active Member of [employer] or Organizations: No    Attends Engineer, structural: Not on file    Marital Status: Married   Catering manager Violence: Not on file    Outpatient Medications Prior to Visit  Medication Sig Dispense Refill   alendronate  (FOSAMAX ) 70 MG tablet Take 1 tablet (70 mg total) by mouth every 7 (seven) days. Take with a full glass of water  on an empty stomach. 12 tablet 3   aspirin  EC 81 MG tablet Take 81 mg by mouth every other day. Swallow whole.     blood  glucose meter kit and supplies Dispense based on patient and insurance preference. Use up to four times daily as directed. (FOR ICD-10 E10.9, E11.9). 1 each 0   Blood Glucose Monitoring Suppl (ACCU-CHEK GUIDE ME) w/Device KIT 1 Device by Does not apply route 4 (four) times daily. 1 kit 0   cholecalciferol (VITAMIN D3) 25 MCG (1000 UNIT) tablet Take 1,000 Units by mouth daily.     hyoscyamine  (LEVSIN  SL) 0.125 MG SL tablet TAKE 1 OR 2 TABLETS BY MOUTH EVERY 6 HOURS AS NEEDED 50 tablet 0   ibuprofen (ADVIL,MOTRIN) 200 MG tablet Take 400 mg by mouth daily as needed for mild pain or moderate pain. Pain     methocarbamol  (ROBAXIN ) 500 MG tablet Take 1 tablet (500 mg total) by mouth 2 (two) times daily. 20 tablet 0   Microlet Lancets MISC USE TO CHECK BLOOD SUGAR FOUR TIMES DAILY AS DIRECTED 100 each 5   pantoprazole  (PROTONIX ) 40 MG tablet TAKE 1 TABLET BY MOUTH DAILY BEFORE BREAKFAST 90 tablet 3   sertraline  (ZOLOFT ) 50 MG tablet Take 1 tablet (50 mg total) by mouth daily. 30 tablet 0   UNABLE TO FIND Home Sleep Study Kit - use as directed to detect sleep apnea Dx: D75.1; R06.83 1 each 0   glucose blood (ACCU-CHEK GUIDE TEST) test strip Use as instructed 100 each 12   rosuvastatin  (CRESTOR ) 10 MG tablet TAKE 1 TABLET(10 MG) BY MOUTH DAILY 30 tablet 0   No facility-administered medications prior to visit.    Allergies  Allergen Reactions   Promethazine  Hcl Hives   Phenytoin Itching   Hydromorphone Itching   Macrolides And Ketolides Itching   Nitrofurantoin Monohyd Macro Itching    ROS Review of Systems  Constitutional:  Negative  for chills and fever.  HENT:  Negative for congestion, sinus pressure, sinus pain and sore throat.   Eyes:  Negative for pain and discharge.  Respiratory:  Negative for cough and shortness of breath.   Cardiovascular:  Negative for chest pain and palpitations.  Gastrointestinal:  Negative for abdominal pain, diarrhea, nausea and vomiting.  Endocrine: Negative for polydipsia and polyuria.  Genitourinary:  Negative for dysuria and hematuria.  Musculoskeletal:  Negative for neck pain and neck stiffness.  Skin:  Negative for rash.  Neurological:  Positive for weakness (LUE - resolved now) and numbness (LUE - intermittent). Negative for dizziness.  Psychiatric/Behavioral:  Negative for agitation and behavioral problems.       Objective:    Physical Exam Vitals reviewed.  Constitutional:      General: She is not in acute distress.    Appearance: She is not diaphoretic.  HENT:     Head: Normocephalic and atraumatic.     Nose: Nose normal. No congestion.     Mouth/Throat:     Mouth: Mucous membranes are moist.     Pharynx: No posterior oropharyngeal erythema.  Eyes:     General: No scleral icterus.    Extraocular Movements: Extraocular movements intact.  Cardiovascular:     Rate and Rhythm: Normal rate and regular rhythm.     Heart sounds: Normal heart sounds. No murmur heard. Pulmonary:     Breath sounds: Normal breath sounds. No wheezing or rales.  Musculoskeletal:     Cervical back: Neck supple. No tenderness.     Right lower leg: No edema.     Left lower leg: No edema.  Skin:    General: Skin is warm.     Findings: No rash.  Neurological:     General: No focal deficit present.     Mental Status: She is alert and oriented to person, place, and time.  Psychiatric:        Mood and Affect: Mood normal.        Behavior: Behavior normal.     BP 112/73   Pulse 63   Ht 5' 3 (1.6 m)   Wt 164 lb (74.4 kg)   LMP 05/11/2011   SpO2 96%   BMI 29.05 kg/m  Wt Readings from  Last 3 Encounters:  07/23/24 164 lb (74.4 kg)  06/17/24 162 lb 7.7 oz (73.7 kg)  01/18/24 162 lb 6.4 oz (73.7 kg)    Lab Results  Component Value Date   TSH 1.400 07/14/2023   Lab Results  Component Value Date   WBC 5.9 06/17/2024   HGB 15.8 (H) 06/17/2024   HCT 48.0 (H) 06/17/2024   MCV 83.2 06/17/2024   PLT 179 06/17/2024   Lab Results  Component Value Date   NA 141 07/17/2024   K 4.4 07/17/2024   CO2 21 07/17/2024   GLUCOSE 109 (H) 07/17/2024   BUN 12 07/17/2024   CREATININE 0.90 07/17/2024   BILITOT 0.5 07/17/2024   ALKPHOS 104 07/17/2024   AST 27 07/17/2024   ALT 24 07/17/2024   PROT 5.9 (L) 07/17/2024   ALBUMIN 3.9 07/17/2024   CALCIUM  9.4 07/17/2024   ANIONGAP 8 06/17/2024   EGFR 71 07/17/2024   GFR 75.19 01/25/2019   Lab Results  Component Value Date   CHOL 145 07/17/2024   Lab Results  Component Value Date   HDL 47 07/17/2024   Lab Results  Component Value Date   LDLCALC 80 07/17/2024   Lab Results  Component Value Date   TRIG 96 07/17/2024   Lab Results  Component Value Date   CHOLHDL 3.1 07/17/2024   Lab Results  Component Value Date   HGBA1C 6.6 (H) 07/17/2024      Assessment & Plan:   Problem List Items Addressed This Visit       Respiratory   Sleep apnea   Uses CPAP regularly, continues to benefit from it She would benefit from Zepbound/Mounjaro - but she prefers to wait for now      Relevant Orders   CMP14+EGFR   CBC with Differential/Platelet     Digestive   GERD (gastroesophageal reflux disease) (Chronic)   Well controlled with Protonix         Endocrine   Type 2 diabetes mellitus with other specified complication (HCC) - Primary   Lab Results  Component Value Date   HGBA1C 6.6 (H) 07/17/2024    Diet controlled Associated with HLD, GERD and GAD Briefly discussed about Mounjaro, considering her OSA -she prefers to wait for now Advised to follow diabetic diet On statin F/u CMP and lipid panel Diabetic eye  exam: Advised to follow up with Ophthalmology for diabetic eye exam      Relevant Medications   rosuvastatin  (CRESTOR ) 10 MG tablet   glucose blood (ACCU-CHEK GUIDE TEST) test strip   Other Relevant Orders   Hemoglobin A1c   CMP14+EGFR     Nervous and Auditory   Cervical radiculopathy   Recent ER visit chart reviewed, including imaging Has intermittent neck pain with LUE numbness, tingling and weakness Has been evaluated by [facility], but since her symptoms have improved now, she has opted for conservative treatment for now Tylenol  arthritis as needed for pain  Musculoskeletal and Integument   Age-related osteoporosis without current pathological fracture   Started alendronate  after reviewing DEXA scan Continue vitamin D  supplement        Other   HLD (hyperlipidemia)   Lipid profile reviewed - improved now On Crestor  now      Relevant Medications   rosuvastatin  (CRESTOR ) 10 MG tablet   Other Relevant Orders   Lipid panel   Anxiety   Well-controlled with Zoloft  50 mg once daily As she is retired now, does not have work-related stress      Relevant Orders   TSH   CMP14+EGFR   CBC with Differential/Platelet   Other Visit Diagnoses       Vitamin D  deficiency       Relevant Orders   VITAMIN D  25 Hydroxy (Vit-D Deficiency, Fractures)     Encounter for immunization       Relevant Orders   Flu vaccine HIGH DOSE PF(Fluzone Trivalent) (Completed)        Meds ordered this encounter  Medications   rosuvastatin  (CRESTOR ) 10 MG tablet    Sig: Take 1 tablet (10 mg total) by mouth daily.    Dispense:  90 tablet    Refill:  3   glucose blood (ACCU-CHEK GUIDE TEST) test strip    Sig: Use as instructed    Dispense:  100 each    Refill:  12    Follow-up: Return in about 6 months (around 01/21/2025) for DM.    Suzzane MARLA Blanch, MD

## 2024-07-23 NOTE — Assessment & Plan Note (Signed)
 Lab Results  Component Value Date   HGBA1C 6.6 (H) 07/17/2024    Diet controlled Associated with HLD, GERD and GAD Briefly discussed about Mounjaro, considering her OSA -she prefers to wait for now Advised to follow diabetic diet On statin F/u CMP and lipid panel Diabetic eye exam: Advised to follow up with Ophthalmology for diabetic eye exam

## 2024-07-24 ENCOUNTER — Encounter (INDEPENDENT_AMBULATORY_CARE_PROVIDER_SITE_OTHER): Payer: Self-pay | Admitting: Gastroenterology

## 2024-07-24 NOTE — Assessment & Plan Note (Signed)
 Started alendronate  after reviewing DEXA scan Continue vitamin D  supplement

## 2024-07-24 NOTE — Assessment & Plan Note (Signed)
 Uses CPAP regularly, continues to benefit from it She would benefit from Zepbound/Mounjaro - but she prefers to wait for now

## 2024-07-24 NOTE — Assessment & Plan Note (Signed)
Lipid profile reviewed - improved now On Crestor now 

## 2024-07-24 NOTE — Assessment & Plan Note (Addendum)
 Recent ER visit chart reviewed, including imaging Has intermittent neck pain with LUE numbness, tingling and weakness Has been evaluated by [facility], but since her symptoms have improved now, she has opted for conservative treatment for now Tylenol  arthritis as needed for pain

## 2024-08-13 ENCOUNTER — Other Ambulatory Visit: Payer: Self-pay

## 2024-08-13 ENCOUNTER — Telehealth: Payer: Self-pay

## 2024-08-13 DIAGNOSIS — E1169 Type 2 diabetes mellitus with other specified complication: Secondary | ICD-10-CM

## 2024-08-13 MED ORDER — BLOOD GLUCOSE TEST STRIPS 333 VI STRP: ORAL_STRIP | 10 refills | Status: AC

## 2024-08-13 NOTE — Telephone Encounter (Signed)
 New rx sent to pharmacy

## 2024-08-13 NOTE — Telephone Encounter (Signed)
 Copied from CRM [id number]. Topic: Clinical - Medication Question >> Aug 13, 2024  9:52 AM Montie POUR wrote: Reason for CRM:  [facility] needs a nurse to call to let them know the correct prescription for Contour 1 Next test strips and what date did she get her machine. Please call [phone] to discuss with [facility]. Please call Dereonna with any questions at [phone]

## 2024-08-30 ENCOUNTER — Telehealth: Payer: Self-pay | Admitting: Internal Medicine

## 2024-08-30 ENCOUNTER — Ambulatory Visit (INDEPENDENT_AMBULATORY_CARE_PROVIDER_SITE_OTHER): Payer: Self-pay | Admitting: Internal Medicine

## 2024-08-30 ENCOUNTER — Encounter: Payer: Self-pay | Admitting: Internal Medicine

## 2024-08-30 VITALS — BP 116/75 | HR 60 | Ht 63.0 in | Wt 161.4 lb

## 2024-08-30 DIAGNOSIS — H00012 Hordeolum externum right lower eyelid: Secondary | ICD-10-CM | POA: Insufficient documentation

## 2024-08-30 MED ORDER — ERYTHROMYCIN 5 MG/GM OP OINT: 1.0000 | TOPICAL_OINTMENT | Freq: Every day | OPHTHALMIC | 0 refills | Status: DC

## 2024-08-30 NOTE — Progress Notes (Signed)
 Acute Office Visit  Subjective:    Patient ID: Mallory [name], female    DOB: [birth year], 68 y.o.   MRN: [id number]  Chief Complaint  Patient presents with   Eye Problem    Reports concerns about right eye thinks its a stye ongoing for 2 weeks.     HPI Patient is in today for evaluation of right eyelid swelling and redness for the last 2 weeks.  She contacted her optometrist, who prescribed oral azithromycin, but she reports that she was not evaluated in person.  She has noticed mild improvement in the pain and swelling, but is concerned due to persistent bump over the right lower eyelid.  Denies any visual disturbance currently.  Denies any mucoid discharge.  Past Medical History:  Diagnosis Date   GERD (gastroesophageal reflux disease)    HLD (hyperlipidemia)    IBS (irritable bowel syndrome)    Osteoarthritis of knee    Right; had right TKR   Pulmonary embolism (HCC)    5 yrs ago (unknown region)   Sleep apnea     Past Surgical History:  Procedure Laterality Date   CESAREAN SECTION  1991   mc   CESAREAN SECTION N/A    [facility] 01/16/2021   CHOLECYSTECTOMY  15 yrs ago   mc   COLONOSCOPY  05/24/2012   Procedure: COLONOSCOPY;  Surgeon: Claudis RAYMOND Rivet, MD;  Location: [facility];  Service: Endoscopy;  Laterality: N/A;  220   ERCP  08/12/2011   Procedure: ENDOSCOPIC RETROGRADE CHOLANGIOPANCREATOGRAPHY (ERCP);  Surgeon: Claudis RAYMOND Rivet, MD;  Location: [facility];  Service: Endoscopy;  Laterality: N/A;   ERCP W/ SPHICTEROTOMY  08/12/11   Dr Rehman-?microlithiasis, SOD   ESOPHAGOGASTRODUODENOSCOPY  11/25/2011   Procedure: ESOPHAGOGASTRODUODENOSCOPY (EGD);  Surgeon: Claudis RAYMOND Rivet, MD;  Location: [facility];  Service: Endoscopy;  Laterality: N/A;  830   EUS  01/19/2012   Procedure: UPPER ENDOSCOPIC ULTRASOUND (EUS) LINEAR;  Surgeon: Toribio SHAUNNA Cedar, MD;  Location: [facility];  Service: Endoscopy;  Laterality: N/A;  pt moved up a hour early by AW , Patty @ [place] office ok'd /  pt was called by AW   JOINT REPLACEMENT N/A    [facility]reesia 01/16/2021   SPHINCTEROTOMY  08/12/2011   Procedure: ANNETT;  Surgeon: Claudis RAYMOND Rivet, MD;  Location: [facility];  Service: Endoscopy;;  with ballon passage   TOTAL KNEE ARTHROPLASTY Right 09/13/2017   TOTAL KNEE ARTHROPLASTY Right 09/13/2017   Procedure: RIGHT TOTAL KNEE ARTHROPLASTY;  Surgeon: Vernetta Lonni GRADE, MD;  Location: [facility];  Service: Orthopedics;  Laterality: Right;   TUBAL LIGATION      Family History  Problem Relation Age of Onset   Diabetes Mother    Heart failure Father    Heart disease Sister    Diabetes Brother    Heart disease Maternal Grandmother    Cancer Maternal Grandfather        ? pancreatic or lung   Anesthesia problems Neg Hx    Hypotension Neg Hx    Malignant hyperthermia Neg Hx    Pseudochol deficiency Neg Hx    Stroke Neg Hx    Colon cancer Neg Hx    Colon polyps Neg Hx    Esophageal cancer Neg Hx    Rectal cancer Neg Hx    Stomach cancer Neg Hx     Social History   Socioeconomic History   Marital status: Married    Spouse name: Not on file   Number of  children: 2   Years of education: Not on file   Highest education level: 12th grade  Occupational History   Occupation: [employer]    Comment: Careers Information Officer  Tobacco Use   Smoking status: Never   Smokeless tobacco: Never  Vaping Use   Vaping status: Never Used  Substance and Sexual Activity   Alcohol use: Yes    Alcohol/week: 0.0 standard drinks of alcohol    Comment: occasional wine or moonshine, but only on vacation   Drug use: No   Sexual activity: Yes    Birth control/protection: Post-menopausal  Other Topics Concern   Not on file  Social History Narrative    2 daughters      Social Drivers of Corporate Investment Banker Strain: Low Risk  (07/21/2024)   Overall Financial Resource Strain (CARDIA)    Difficulty of Paying Living Expenses: Not hard at all  Food Insecurity: No Food Insecurity (07/21/2024)    Hunger Vital Sign    Worried About Running Out of Food in the Last Year: Never true    Ran Out of Food in the Last Year: Never true  Transportation Needs: No Transportation Needs (07/21/2024)   PRAPARE - Administrator, Civil Service (Medical): No    Lack of Transportation (Non-Medical): No  Physical Activity: Insufficiently Active (07/21/2024)   Exercise Vital Sign    Days of Exercise per Week: 3 days    Minutes of Exercise per Session: 20 min  Stress: No Stress Concern Present (07/21/2024)   [employer] of Occupational Health - Occupational Stress Questionnaire    Feeling of Stress: Not at all  Social Connections: Moderately Integrated (07/21/2024)   Social Connection and Isolation Panel    Frequency of Communication with Friends and Family: More than three times a week    Frequency of Social Gatherings with Friends and Family: More than three times a week    Attends Religious Services: 1 to 4 times per year    Active Member of [employer] or Organizations: No    Attends Engineer, Structural: Not on file    Marital Status: Married  Catering Manager Violence: Not on file    Outpatient Medications Prior to Visit  Medication Sig Dispense Refill   alendronate  (FOSAMAX ) 70 MG tablet Take 1 tablet (70 mg total) by mouth every 7 (seven) days. Take with a full glass of water  on an empty stomach. 12 tablet 3   aspirin  EC 81 MG tablet Take 81 mg by mouth every other day. Swallow whole.     blood glucose meter kit and supplies Dispense based on patient and insurance preference. Use up to four times daily as directed. (FOR ICD-10 E10.9, E11.9). 1 each 0   Blood Glucose Monitoring Suppl (ACCU-CHEK GUIDE ME) w/Device KIT 1 Device by Does not apply route 4 (four) times daily. 1 kit 0   cholecalciferol (VITAMIN D3) 25 MCG (1000 UNIT) tablet Take 1,000 Units by mouth daily.     Glucose Blood (BLOOD GLUCOSE TEST STRIPS 333) STRP CONTOUR 1 NEXT TEST STRIP : use up to four times  daily as directed, (for ICD-20,E10.9,E11.9) 50 strip 10   hyoscyamine  (LEVSIN  SL) 0.125 MG SL tablet TAKE 1 OR 2 TABLETS BY MOUTH EVERY 6 HOURS AS NEEDED 50 tablet 0   ibuprofen (ADVIL,MOTRIN) 200 MG tablet Take 400 mg by mouth daily as needed for mild pain or moderate pain. Pain     methocarbamol  (ROBAXIN ) 500 MG tablet Take 1 tablet (500  mg total) by mouth 2 (two) times daily. 20 tablet 0   Microlet Lancets MISC USE TO CHECK BLOOD SUGAR FOUR TIMES DAILY AS DIRECTED 100 each 5   pantoprazole  (PROTONIX ) 40 MG tablet TAKE 1 TABLET BY MOUTH DAILY BEFORE BREAKFAST 90 tablet 3   rosuvastatin  (CRESTOR ) 10 MG tablet Take 1 tablet (10 mg total) by mouth daily. 90 tablet 3   sertraline  (ZOLOFT ) 50 MG tablet Take 1 tablet (50 mg total) by mouth daily. 30 tablet 0   UNABLE TO FIND Home Sleep Study Kit - use as directed to detect sleep apnea Dx: D75.1; R06.83 1 each 0   glucose blood (ACCU-CHEK GUIDE TEST) test strip Use as instructed 100 each 12   No facility-administered medications prior to visit.    Allergies  Allergen Reactions   Promethazine  Hcl Hives   Phenytoin Itching   Hydromorphone Itching   Macrolides And Ketolides Itching   Nitrofurantoin Monohyd Macro Itching    Review of Systems  Constitutional:  Negative for chills and fever.  HENT:  Negative for congestion, sinus pressure, sinus pain and sore throat.   Eyes:  Positive for pain. Negative for discharge.  Respiratory:  Negative for cough and shortness of breath.   Cardiovascular:  Negative for chest pain and palpitations.  Gastrointestinal:  Negative for abdominal pain, diarrhea, nausea and vomiting.  Endocrine: Negative for polydipsia and polyuria.  Genitourinary:  Negative for dysuria and hematuria.  Musculoskeletal:  Negative for neck pain and neck stiffness.  Skin:  Negative for rash.  Neurological:  Positive for weakness (LUE - resolved now) and numbness (LUE - intermittent). Negative for dizziness.   Psychiatric/Behavioral:  Negative for agitation and behavioral problems.        Objective:    Physical Exam Vitals reviewed.  Constitutional:      General: She is not in acute distress.    Appearance: She is not diaphoretic.  HENT:     Head: Normocephalic and atraumatic.     Nose: Nose normal. No congestion.     Mouth/Throat:     Mouth: Mucous membranes are moist.     Pharynx: No posterior oropharyngeal erythema.  Eyes:     General: No scleral icterus.       Right eye: Hordeolum (Lower eyelid) present.     Extraocular Movements: Extraocular movements intact.  Cardiovascular:     Rate and Rhythm: Normal rate and regular rhythm.     Heart sounds: Normal heart sounds. No murmur heard. Pulmonary:     Breath sounds: Normal breath sounds. No wheezing or rales.  Musculoskeletal:     Cervical back: Neck supple. No tenderness.     Right lower leg: No edema.     Left lower leg: No edema.  Skin:    General: Skin is warm.     Findings: No rash.  Neurological:     General: No focal deficit present.     Mental Status: She is alert and oriented to person, place, and time.  Psychiatric:        Mood and Affect: Mood normal.        Behavior: Behavior normal.     BP 116/75   Pulse 60   Ht 5' 3 (1.6 m)   Wt 161 lb 6.4 oz (73.2 kg)   LMP 05/11/2011   SpO2 97%   BMI 28.59 kg/m  Wt Readings from Last 3 Encounters:  08/30/24 161 lb 6.4 oz (73.2 kg)  07/23/24 164 lb (74.4 kg)  06/17/24 162  lb 7.7 oz (73.7 kg)        Assessment & Plan:   Problem List Items Addressed This Visit       Other   Hordeolum externum of right lower eyelid - Primary   Advised to apply warm compresses about 4-5 times in a day Erythromycin  ointment prescribed Advised to contact if persistent redness or swelling      Relevant Medications   erythromycin  ophthalmic ointment     Meds ordered this encounter  Medications   erythromycin  ophthalmic ointment    Sig: Place 1 Application into the  right eye at bedtime.    Dispense:  4 g    Refill:  0     Ashiya Kinkead MARLA Blanch, MD

## 2024-08-30 NOTE — Patient Instructions (Signed)
 Please perform warm compresses over the eye about 4-5 times in a day.  Please apply Erythromycin  ointment over eyelid area.

## 2024-08-30 NOTE — Assessment & Plan Note (Addendum)
 Advised to apply warm compresses about 4-5 times in a day Erythromycin  ointment prescribed Advised to contact if persistent redness or swelling

## 2024-08-30 NOTE — Telephone Encounter (Unsigned)
 Copied from CRM #[id number]. Topic: Clinical - Prescription Issue >> Aug 30, 2024  3:49 PM Edsel HERO wrote: Patient states that she went to pick up the erythromycin  ophthalmic ointment that was sent in for her today and it contains an ingredient that she is allergic too. Patient would like an alternative sent in to  [name] - [place], [place] - [address] AT SEC OF [address], [place] [address] Phone: [phone]  Fax: [phone]

## 2024-09-02 MED ORDER — BACITRACIN-POLYMYXIN B 500-10000 UNIT/GM OP OINT: 1.0000 | TOPICAL_OINTMENT | Freq: Two times a day (BID) | OPHTHALMIC | 0 refills | Status: AC

## 2024-09-02 NOTE — Telephone Encounter (Signed)
 PT INFORMED, VERBALIZED UNDERSTANDING.

## 2024-09-02 NOTE — Addendum Note (Signed)
 Addended byBETHA TOBIE DOWNS on: 09/02/2024 11:53 AM   Modules accepted: Orders

## 2024-09-18 ENCOUNTER — Ambulatory Visit

## 2024-10-14 ENCOUNTER — Ambulatory Visit: Payer: Self-pay

## 2024-10-14 ENCOUNTER — Ambulatory Visit (INDEPENDENT_AMBULATORY_CARE_PROVIDER_SITE_OTHER)

## 2024-10-14 VITALS — BP 121/71 | HR 73 | Temp 98.1°F | Ht 63.0 in | Wt 162.2 lb

## 2024-10-14 DIAGNOSIS — K529 Noninfective gastroenteritis and colitis, unspecified: Secondary | ICD-10-CM | POA: Diagnosis not present

## 2024-10-14 DIAGNOSIS — R112 Nausea with vomiting, unspecified: Secondary | ICD-10-CM

## 2024-10-14 MED ORDER — OSELTAMIVIR PHOSPHATE 75 MG PO CAPS: 75.0000 mg | ORAL_CAPSULE | Freq: Two times a day (BID) | ORAL | 0 refills | Status: AC

## 2024-10-14 MED ORDER — ONDANSETRON 4 MG PO TBDP: 4.0000 mg | ORAL_TABLET | Freq: Three times a day (TID) | ORAL | 0 refills | Status: AC | PRN

## 2024-10-14 NOTE — Telephone Encounter (Signed)
 FYI Only or Action Required?: FYI only for provider: appointment scheduled on 1/5.  Patient was last seen in primary care on 08/30/2024 by Tobie Suzzane POUR, MD.  Called Nurse Triage reporting Influenza.  Symptoms began yesterday.  Interventions attempted: OTC medications: advil, nyquil.  Symptoms are: gradually worsening.  Triage Disposition: Call PCP Within 24 Hours  Patient/caregiver understands and will follow disposition?: Yes  Copied from CRM #[id number]. Topic: Clinical - Red Word Triage >> Oct 14, 2024 11:07 AM Nathanel BROCKS wrote: Red Word that prompted transfer to Nurse Triage: pt feels like she has the flu. She is throwing up, chills, low grade fever, body aches. Reason for Disposition  Patient is HIGH RISK (e.g., 65 years and older, pregnant, HIV+, or chronic medical condition)  Answer Assessment - Initial Assessment Questions Started yesterday-- chills body aches, fever (didn't measure), nausea, with one episode of emesis. Able to drink today and has not thrown up today. Denies CP, SOB, Dizziness or diarrhea, cough, or sore throat.  Advil q5hr, nyquil   Appt with [facility] this afternoon to assess. In window for Tamiflu . ED precautions understood.    1. SYMPTOMS: What is your main symptom or concern? (e.g., cough, fever, shortness of breath, muscle aches)     Fever, body aches, nausea, Emesis x1 yesterday  2. ONSET: When did the symptoms start?      Started yesterday  3. COUGH: Do you have a cough? If Yes, ask: How bad is the cough?       Denies  4. FEVER: Do you have a fever? If Yes, ask: What is your temperature, how was it measured, and when did it start?     Yes- didn't measure but felt so bad then sweat really bad  5. BREATHING DIFFICULTY: Are you having any difficulty breathing? (e.g., normal; shortness of breath, wheezing, unable to speak)      Denies  6. BETTER-SAME-WORSE: Are you getting better, staying the same or getting worse compared to yesterday?  If  getting worse, ask, In what way?     Worse  7. OTHER SYMPTOMS: Do you have any other symptoms?  (e.g., chills, fatigue, headache, loss of smell or taste, muscle pain, sore throat)     Body aches 8. INFLUENZA EXPOSURE: Was there any known exposure to influenza (flu) before the symptoms began?      Unsure of where she got exposed 9. INFLUENZA SUSPECTED: Why do you think you have influenza? (e.g., positive flu self-test at home, symptoms after exposure).     Symptoms  10. INFLUENZA VACCINE: Have you had the flu vaccine? If Yes, ask: When did you last get it?       Yes got this year 7. HIGH RISK FOR COMPLICATIONS: Do you have any chronic medical problems? (e.g., asthma, heart or lung disease, obesity, weak immune system)       Allergies, OSA 13. O2 SATURATION MONITOR:  Do you use an oxygen saturation monitor (pulse oximeter) at home? If Yes, ask What is your reading (oxygen level) today? What is your usual oxygen saturation reading? (e.g., 95%)       UTA  Protocols used: Influenza (Flu) Suspected-A-AH

## 2024-10-14 NOTE — Progress Notes (Signed)
 "  Acute Office Visit  Subjective:     Patient ID: AMBUR PROVINCE, female    DOB: 04/06/1956, 69 y.o.   MRN: [id number]  Chief Complaint  Patient presents with   Abdominal Pain   Chills   Generalized Body Aches   Fever    Taken Advil and niquil  Symptoms started yesterday morning   Emesis    Abdominal Pain Associated symptoms include a fever, nausea and vomiting. Pertinent negatives include no constipation, diarrhea or headaches.  Fever  Associated symptoms include abdominal pain, nausea and vomiting. Pertinent negatives include no chest pain, congestion, coughing, diarrhea, ear pain, headaches, sore throat or wheezing.  Emesis  Associated symptoms include abdominal pain, chills and a fever. Pertinent negatives include no chest pain, coughing, diarrhea, dizziness or headaches.   Patient is in today for abdominal pain, body aches, fever, and vomiting that started yesterday. Had two vomiting episodes yesterday after eating. Has been able to eat crackers and drink gingerale today with no vomiting episodes. Denies any diarrhea or constipation. Having normal bowel movements. Endorses diffuse lower abdominal pain and nausea at times. Denies any triggering foods, and says that she is the only one at home with symptoms. Has been taking advil and nyquil for symptom management. Denies sick contacts. Blood sugars have been stable in the 110's.   Review of Systems  Constitutional:  Positive for appetite change, chills, fatigue and fever.  HENT:  Negative for congestion, ear pain, rhinorrhea, sinus pressure, sinus pain, sneezing and sore throat.   Respiratory:  Negative for cough, chest tightness, shortness of breath and wheezing.   Cardiovascular:  Negative for chest pain and palpitations.  Gastrointestinal:  Positive for abdominal pain, nausea and vomiting. Negative for blood in stool, constipation and diarrhea.  Genitourinary:  Negative for difficulty urinating.  Neurological:  Negative for  dizziness, weakness and headaches.       Objective:    Today's Vitals   10/14/24 1449  BP: 121/71  Pulse: 73  Temp: 98.1 F (36.7 C)  SpO2: 98%  Weight: 162 lb 3.2 oz (73.6 kg)  Height: 5' 3 (1.6 m)   Body mass index is 28.73 kg/m.   Physical Exam Vitals and nursing note reviewed.  Constitutional:      General: She is not in acute distress.    Appearance: Normal appearance. She is not ill-appearing.  Cardiovascular:     Rate and Rhythm: Normal rate and regular rhythm.     Heart sounds: Normal heart sounds, S1 normal and S2 normal. No murmur heard. Pulmonary:     Effort: Pulmonary effort is normal. No respiratory distress.     Breath sounds: Normal breath sounds. No wheezing.  Abdominal:     General: There is no distension.     Palpations: Abdomen is soft.     Tenderness: There is abdominal tenderness in the right lower quadrant, periumbilical area and left lower quadrant. There is no right CVA tenderness, left CVA tenderness, guarding or rebound.     Comments: Diffuse abdominal pain with palpation noted. No obvious masses or abnormalities palpated.   Neurological:     Mental Status: She is alert.  Psychiatric:        Mood and Affect: Mood normal.        Behavior: Behavior normal.        Thought Content: Thought content normal.        Judgment: Judgment normal.    No results found for any visits on 10/14/24.  Assessment & Plan:  1. Gastroenteritis (Primary) -Suspected viral gastroenteritis associated with influenza symptoms. Rapid influenza testing not available in office. Will treat empirically with Tamiflu  due to patient being in 48-hour window.  -Advised to alternate tylenol  and advil for symptom management.  -Advised adequate hydration and recommended BRAT diet as tolerated.  -ED precautions reviewed.  - oseltamivir  (TAMIFLU ) 75 MG capsule; Take 1 capsule (75 mg total) by mouth 2 (two) times daily.  Dispense: 14 capsule; Refill: 0  2. Nausea and vomiting,  unspecified vomiting type -Educated patient on appropriate use and potential side effects.   -Advised patient to monitor blood sugars, and notify the office if they are out of range.  - ondansetron  (ZOFRAN -ODT) 4 MG disintegrating tablet; Take 1 tablet (4 mg total) by mouth every 8 (eight) hours as needed for nausea or vomiting.  Dispense: 20 tablet; Refill: 0   Return if symptoms worsen or fail to improve.   Damien KATHEE [place], FNP  "

## 2024-10-14 NOTE — Telephone Encounter (Signed)
Noted patient scheduled

## 2024-10-15 ENCOUNTER — Ambulatory Visit (INDEPENDENT_AMBULATORY_CARE_PROVIDER_SITE_OTHER)

## 2024-10-15 VITALS — Ht 63.0 in | Wt 158.0 lb

## 2024-10-15 DIAGNOSIS — Z0000 Encounter for general adult medical examination without abnormal findings: Secondary | ICD-10-CM

## 2024-10-15 NOTE — Progress Notes (Signed)
 "  Screenings not ordered: Already has appt for mammogram  screening Already had diabetic eye exam screening - Records requested - Awaiting results Chief Complaint  Patient presents with   Medicare Wellness     Subjective:   Mallory [name] is a 69 y.o. female who presents for a Medicare Annual Wellness Visit.  Visit info / Clinical Intake: Medicare Wellness Visit Type:: Subsequent Annual Wellness Visit Persons participating in visit and providing information:: patient Medicare Wellness Visit Mode:: Telephone If telephone:: video error Since this visit was completed virtually, some vitals may be partially provided or unavailable. Missing vitals are due to the limitations of the virtual format.: Documented vitals are patient reported If Telephone or Video please confirm:: I connected with patient using audio/video enable telemedicine. I verified patient identity with two identifiers, discussed telehealth limitations, and patient agreed to proceed. Patient Location:: home Provider Location:: office Interpreter Needed?: No Pre-visit prep was completed: yes AWV questionnaire completed by patient prior to visit?: yes Date:: 10/14/24 Living arrangements:: (Patient-Rptd) lives with spouse/significant other Patient's Overall Health Status Rating: (Patient-Rptd) good Typical amount of pain: (Patient-Rptd) none Does pain affect daily life?: (Patient-Rptd) no Are you currently prescribed opioids?: no  Dietary Habits and Nutritional Risks How many meals a day?: (Patient-Rptd) 3 Eats fruit and vegetables daily?: (Patient-Rptd) yes Most meals are obtained by: (Patient-Rptd) preparing own meals In the last 2 weeks, have you had any of the following?: none Diabetic:: (!) yes Any non-healing wounds?: no How often do you check your BS?: 1 Would you like to be referred to a Nutritionist or for Diabetic Management? : no  Functional Status Activities of Daily Living (to include  ambulation/medication): (Patient-Rptd) Independent Ambulation: (Patient-Rptd) Independent Medication Administration: (Patient-Rptd) Independent Home Management (perform basic housework or laundry): (Patient-Rptd) Independent Manage your own finances?: (Patient-Rptd) yes Primary transportation is: (Patient-Rptd) driving Concerns about vision?: no *vision screening is required for WTM* Concerns about hearing?: no  Fall Screening Falls in the past year?: (Patient-Rptd) 0 Number of falls in past year: 0 Was there an injury with Fall?: 0 Fall Risk Category Calculator: 0 Patient Fall Risk Level: Low Fall Risk  Fall Risk Patient at Risk for Falls Due to: No Fall Risks Fall risk Follow up: Falls evaluation completed; Education provided; Falls prevention discussed  Home and Transportation Safety: All rugs have non-skid backing?: (Patient-Rptd) yes All stairs or steps have railings?: (Patient-Rptd) yes Grab bars in the bathtub or shower?: (Patient-Rptd) yes Have non-skid surface in bathtub or shower?: (Patient-Rptd) yes Good home lighting?: (Patient-Rptd) yes Regular seat belt use?: (Patient-Rptd) yes Hospital stays in the last year:: (Patient-Rptd) no  Cognitive Assessment Difficulty concentrating, remembering, or making decisions? : (Patient-Rptd) no Will 6CIT or Mini Cog be Completed: yes What year is it?: 0 points What month is it?: 0 points Give patient an address phrase to remember (5 components): [address] [place] About what time is it?: 0 points Count backwards from 20 to 1: 0 points Say the months of the year in reverse: 0 points Repeat the address phrase from earlier: 0 points 6 CIT Score: 0 points  Advance Directives (For Healthcare) Does Patient Have a Medical Advance Directive?: No Would patient like information on creating a medical advance directive?: No - Patient declined  Reviewed/Updated  Reviewed/Updated: Reviewed All (Medical, Surgical, Family,  Medications, Allergies, Care Teams, Patient Goals)    Allergies (verified) Promethazine  hcl, Phenytoin, Hydromorphone, Macrolides and ketolides, and Nitrofurantoin monohyd macro   Current Medications (verified) Outpatient Encounter Medications  as of 10/15/2024  Medication Sig   alendronate  (FOSAMAX ) 70 MG tablet Take 1 tablet (70 mg total) by mouth every 7 (seven) days. Take with a full glass of water  on an empty stomach.   aspirin  EC 81 MG tablet Take 81 mg by mouth every other day. Swallow whole.   bacitracin -polymyxin b  (POLYSPORIN ) ophthalmic ointment Place 1 Application into the right eye every 12 (twelve) hours. apply to eye every 12 hours while awake   blood glucose meter kit and supplies Dispense based on patient and insurance preference. Use up to four times daily as directed. (FOR ICD-10 E10.9, E11.9).   Blood Glucose Monitoring Suppl (ACCU-CHEK GUIDE ME) w/Device KIT 1 Device by Does not apply route 4 (four) times daily.   cholecalciferol (VITAMIN D3) 25 MCG (1000 UNIT) tablet Take 1,000 Units by mouth daily.   Glucose Blood (BLOOD GLUCOSE TEST STRIPS 333) STRP CONTOUR 1 NEXT TEST STRIP : use up to four times daily as directed, (for ICD-20,E10.9,E11.9)   hyoscyamine  (LEVSIN  SL) 0.125 MG SL tablet TAKE 1 OR 2 TABLETS BY MOUTH EVERY 6 HOURS AS NEEDED   ibuprofen (ADVIL,MOTRIN) 200 MG tablet Take 400 mg by mouth daily as needed for mild pain or moderate pain. Pain   methocarbamol  (ROBAXIN ) 500 MG tablet Take 1 tablet (500 mg total) by mouth 2 (two) times daily.   Microlet Lancets MISC USE TO CHECK BLOOD SUGAR FOUR TIMES DAILY AS DIRECTED   ondansetron  (ZOFRAN -ODT) 4 MG disintegrating tablet Take 1 tablet (4 mg total) by mouth every 8 (eight) hours as needed for nausea or vomiting.   oseltamivir  (TAMIFLU ) 75 MG capsule Take 1 capsule (75 mg total) by mouth 2 (two) times daily.   pantoprazole  (PROTONIX ) 40 MG tablet TAKE 1 TABLET BY MOUTH DAILY BEFORE BREAKFAST   rosuvastatin  (CRESTOR ) 10  MG tablet Take 1 tablet (10 mg total) by mouth daily.   sertraline  (ZOLOFT ) 50 MG tablet Take 1 tablet (50 mg total) by mouth daily.   UNABLE TO FIND Home Sleep Study Kit - use as directed to detect sleep apnea Dx: D75.1; R06.83   No facility-administered encounter medications on file as of 10/15/2024.    History: Past Medical History:  Diagnosis Date   Allergy 2018   Seasonal   GERD (gastroesophageal reflux disease)    HLD (hyperlipidemia)    IBS (irritable bowel syndrome)    Osteoarthritis of knee    Right; had right TKR   Pulmonary embolism (HCC)    5 yrs ago (unknown region)   Sleep apnea    Past Surgical History:  Procedure Laterality Date   CESAREAN SECTION  1991   [facility]   CESAREAN SECTION N/A    [facility] 01/16/2021   CHOLECYSTECTOMY  15 yrs ago   [facility]   COLONOSCOPY  05/24/2012   Procedure: COLONOSCOPY;  Surgeon: Claudis RAYMOND Rivet, MD;  Location: [facility];  Service: Endoscopy;  Laterality: N/A;  220   ERCP  08/12/2011   Procedure: ENDOSCOPIC RETROGRADE CHOLANGIOPANCREATOGRAPHY (ERCP);  Surgeon: Claudis RAYMOND Rivet, MD;  Location: [facility];  Service: Endoscopy;  Laterality: N/A;   ERCP W/ SPHICTEROTOMY  08/12/11   Dr Rehman-?microlithiasis, SOD   ESOPHAGOGASTRODUODENOSCOPY  11/25/2011   Procedure: ESOPHAGOGASTRODUODENOSCOPY (EGD);  Surgeon: Claudis RAYMOND Rivet, MD;  Location: [facility];  Service: Endoscopy;  Laterality: N/A;  830   EUS  01/19/2012   Procedure: UPPER ENDOSCOPIC ULTRASOUND (EUS) LINEAR;  Surgeon: Toribio SHAUNNA Cedar, MD;  Location: [facility];  Service: Endoscopy;  Laterality:  N/A;  pt moved up a hour early by AW , Patty @ [place] office ok'd / pt was called by AW   JOINT REPLACEMENT N/A    [facility] 01/16/2021   SPHINCTEROTOMY  08/12/2011   Procedure: ANNETT;  Surgeon: Claudis RAYMOND Rivet, MD;  Location: [facility];  Service: Endoscopy;;  with ballon passage   TOTAL KNEE ARTHROPLASTY Right 09/13/2017   TOTAL KNEE ARTHROPLASTY Right 09/13/2017   Procedure: RIGHT TOTAL  KNEE ARTHROPLASTY;  Surgeon: Vernetta Lonni GRADE, MD;  Location: [facility];  Service: Orthopedics;  Laterality: Right;   TUBAL LIGATION     Family History  Problem Relation Age of Onset   Diabetes Mother    Heart failure Father    Heart disease Father    Heart disease Sister    Diabetes Brother    Heart disease Maternal Grandmother    Cancer Maternal Grandfather        ? pancreatic or lung   Anesthesia problems Neg Hx    Hypotension Neg Hx    Malignant hyperthermia Neg Hx    Pseudochol deficiency Neg Hx    Stroke Neg Hx    Colon cancer Neg Hx    Colon polyps Neg Hx    Esophageal cancer Neg Hx    Rectal cancer Neg Hx    Stomach cancer Neg Hx    Social History   Occupational History   Occupation: [employer]    Comment: Careers Information Officer  Tobacco Use   Smoking status: Never   Smokeless tobacco: Never  Vaping Use   Vaping status: Never Used  Substance and Sexual Activity   Alcohol use: Not Currently    Alcohol/week: 2.0 standard drinks of alcohol    Types: 1 Glasses of wine, 1 Shots of liquor per week    Comment: occasional wine or moonshine, but only on vacation   Drug use: Never   Sexual activity: Yes    Birth control/protection: Post-menopausal   Tobacco Counseling Counseling given: Yes  SDOH Screenings   Food Insecurity: No Food Insecurity (10/15/2024)  Housing: Low Risk (10/15/2024)  Transportation Needs: No Transportation Needs (10/15/2024)  Utilities: Not At Risk (10/15/2024)  Alcohol Screen: Low Risk (07/21/2024)  Depression (PHQ2-9): Low Risk (10/15/2024)  Financial Resource Strain: Low Risk (07/21/2024)  Physical Activity: Sufficiently Active (10/15/2024)  Recent Concern: Physical Activity - Insufficiently Active (07/21/2024)  Social Connections: Moderately Isolated (10/15/2024)  Stress: No Stress Concern Present (10/15/2024)  Tobacco Use: Low Risk (10/15/2024)  Health Literacy: Adequate Health Literacy (10/15/2024)   See flowsheets for full screening details  Depression  Screen PHQ 2 & 9 Depression Scale- Over the past 2 weeks, how often have you been bothered by any of the following problems? Little interest or pleasure in doing things: 0 Feeling down, depressed, or hopeless (PHQ Adolescent also includes...irritable): 0 PHQ-2 Total Score: 0 Trouble falling or staying asleep, or sleeping too much: 0 Feeling tired or having little energy: 0 Poor appetite or overeating (PHQ Adolescent also includes...weight loss): 0 Feeling bad about yourself - or that you are a failure or have let yourself or your family down: 0 Trouble concentrating on things, such as reading the newspaper or watching television (PHQ Adolescent also includes...like school work): 0 Moving or speaking so slowly that other people could have noticed. Or the opposite - being so fidgety or restless that you have been moving around a lot more than usual: 0 Thoughts that you would be better off dead, or of hurting yourself in some way: 0  PHQ-9 Total Score: 0 If you checked off any problems, how difficult have these problems made it for you to do your work, take care of things at home, or get along with other people?: Not difficult at all  Depression Treatment Depression Interventions/Treatment : EYV7-0 Score <4 Follow-up Not Indicated     Goals Addressed             This Visit's Progress    Improve my health               Objective:    Today's Vitals   10/15/24 1558  Weight: 158 lb (71.7 kg)  Height: 5' 3 (1.6 m)   Body mass index is 27.99 kg/m.  Hearing/Vision screen Hearing Screening - Comments:: Patient denies any hearing difficulties.   Vision Screening - Comments:: Patient wears reading glasses only. Up to date with yearly exams.  Sees Oneil Kawasaki Immunizations and Health Maintenance Health Maintenance  Topic Date Due   Medicare Annual Wellness (AWV)  Never done   OPHTHALMOLOGY EXAM  09/24/2023   COVID-19 Vaccine (3 - 2025-26 season) 06/10/2024   Mammogram   10/04/2024   DTaP/Tdap/Td (2 - Td or Tdap) 12/22/2024   HEMOGLOBIN A1C  01/15/2025   Diabetic kidney evaluation - Urine ACR  01/17/2025   FOOT EXAM  01/17/2025   Diabetic kidney evaluation - eGFR measurement  07/17/2025   Bone Density Scan  01/22/2026   Colonoscopy  09/06/2032   Pneumococcal Vaccine: 50+ Years  Completed   Influenza Vaccine  Completed   Hepatitis C Screening  Completed   Zoster Vaccines- Shingrix   Completed   Meningococcal B Vaccine  Aged Out        Assessment/Plan:  This is a routine wellness examination for Mallory [name].  Patient Care Team: Tobie Suzzane POUR, MD as PCP - General (Internal Medicine) Kawasaki Oneil, DO (Optometry) Barbette Knock, MD as Consulting Physician (Obstetrics and Gynecology)  I have personally reviewed and noted the following in the patients chart:   Medical and social history Use of alcohol, tobacco or illicit drugs  Current medications and supplements including opioid prescriptions. Functional ability and status Nutritional status Physical activity Advanced directives List of other physicians Hospitalizations, surgeries, and ER visits in previous 12 months Vitals Screenings to include cognitive, depression, and falls Referrals and appointments  No orders of the defined types were placed in this encounter.  In addition, I have reviewed and discussed with patient certain preventive protocols, quality metrics, and best practice recommendations. A written personalized care plan for preventive services as well as general preventive health recommendations were provided to patient.   Sloka Volante, CMA   10/15/2024   Return October 17, 2025 at 11:20 am, for In office Medicare Well Visit w  Wellness Nurse.  After Visit Summary: (MyChart) Due to this being a telephonic visit, the after visit summary with patients personalized plan was offered to patient via MyChart  "

## 2024-10-15 NOTE — Patient Instructions (Signed)
 Mallory [name],  Thank you for taking the time for your Medicare Wellness Visit. I appreciate your continued commitment to your health goals. Please review the care plan we discussed, and feel free to reach out if I can assist you further.  Please note that Annual Wellness Visits do not include a physical exam. Some assessments may be limited, especially if the visit was conducted virtually. If needed, we may recommend an in-person follow-up with your provider.  Ongoing Care Seeing your primary care provider every 3 to 6 months helps us  monitor your health and provide consistent, personalized care.   1 year follow up for Medicare well visit: October 17, 2025 at 11:20 am with medicare wellness nurse in office  Referrals If a referral was made during today's visit and you haven't received any updates within two weeks, please contact the referred provider directly to check on the status.  Please have your mammogram report sent to our office so that we can update your record  Recommended Screenings:  Health Maintenance  Topic Date Due   Medicare Annual Wellness Visit  Never done   Eye exam for diabetics  09/24/2023   COVID-19 Vaccine (3 - 2025-26 season) 06/10/2024   Breast Cancer Screening  10/04/2024   DTaP/Tdap/Td vaccine (2 - Td or Tdap) 12/22/2024   Hemoglobin A1C  01/15/2025   Yearly kidney health urinalysis for diabetes  01/17/2025   Complete foot exam   01/17/2025   Yearly kidney function blood test for diabetes  07/17/2025   Osteoporosis screening with Bone Density Scan  01/22/2026   Colon Cancer Screening  09/06/2032   Pneumococcal Vaccine for age over 59  Completed   Flu Shot  Completed   Hepatitis C Screening  Completed   Zoster (Shingles) Vaccine  Completed   Meningitis B Vaccine  Aged Out       10/14/2024    4:47 PM  Advanced Directives  Does Patient Have a Medical Advance Directive? No  Would patient like information on creating a medical advance directive? No - Patient  declined    Vision: Annual vision screenings are recommended for early detection of glaucoma, cataracts, and diabetic retinopathy. These exams can also reveal signs of chronic conditions such as diabetes and high blood pressure.  Dental: Annual dental screenings help detect early signs of oral cancer, gum disease, and other conditions linked to overall health, including heart disease and diabetes.  Please see the attached documents for additional preventive care recommendations.

## 2025-01-21 ENCOUNTER — Ambulatory Visit: Admitting: Internal Medicine

## 2025-10-17 ENCOUNTER — Ambulatory Visit: Payer: Self-pay
